# Patient Record
Sex: Female | Born: 1970 | Race: Black or African American | Hispanic: No | Smoking: Never smoker
Health system: Southern US, Community
[De-identification: ages and names within clinical notes are randomized; demographics above are authoritative.]

## PROBLEM LIST (undated history)

## (undated) DIAGNOSIS — Z79899 Other long term (current) drug therapy: Secondary | ICD-10-CM

## (undated) DIAGNOSIS — N898 Other specified noninflammatory disorders of vagina: Secondary | ICD-10-CM

## (undated) DIAGNOSIS — R5383 Other fatigue: Secondary | ICD-10-CM

## (undated) DIAGNOSIS — R531 Weakness: Secondary | ICD-10-CM

## (undated) DIAGNOSIS — M3500 Sicca syndrome, unspecified: Secondary | ICD-10-CM

## (undated) DIAGNOSIS — J3489 Other specified disorders of nose and nasal sinuses: Secondary | ICD-10-CM

## (undated) DIAGNOSIS — R42 Dizziness and giddiness: Secondary | ICD-10-CM

## (undated) DIAGNOSIS — F419 Anxiety disorder, unspecified: Secondary | ICD-10-CM

## (undated) DIAGNOSIS — B009 Herpesviral infection, unspecified: Secondary | ICD-10-CM

## (undated) DIAGNOSIS — M549 Dorsalgia, unspecified: Secondary | ICD-10-CM

## (undated) DIAGNOSIS — K219 Gastro-esophageal reflux disease without esophagitis: Secondary | ICD-10-CM

## (undated) DIAGNOSIS — N200 Calculus of kidney: Secondary | ICD-10-CM

## (undated) DIAGNOSIS — R51 Headache: Secondary | ICD-10-CM

## (undated) DIAGNOSIS — Z87442 Personal history of urinary calculi: Secondary | ICD-10-CM

## (undated) DIAGNOSIS — S0300XA Dislocation of jaw, unspecified side, initial encounter: Secondary | ICD-10-CM

## (undated) DIAGNOSIS — D649 Anemia, unspecified: Secondary | ICD-10-CM

## (undated) DIAGNOSIS — R7989 Other specified abnormal findings of blood chemistry: Secondary | ICD-10-CM

## (undated) DIAGNOSIS — R109 Unspecified abdominal pain: Secondary | ICD-10-CM

## (undated) DIAGNOSIS — N951 Menopausal and female climacteric states: Secondary | ICD-10-CM

## (undated) DIAGNOSIS — M542 Cervicalgia: Secondary | ICD-10-CM

## (undated) DIAGNOSIS — N83201 Unspecified ovarian cyst, right side: Secondary | ICD-10-CM

## (undated) DIAGNOSIS — IMO0001 Reserved for inherently not codable concepts without codable children: Secondary | ICD-10-CM

## (undated) DIAGNOSIS — R0602 Shortness of breath: Secondary | ICD-10-CM

## (undated) DIAGNOSIS — R232 Flushing: Secondary | ICD-10-CM

## (undated) DIAGNOSIS — IMO0002 Reserved for concepts with insufficient information to code with codable children: Secondary | ICD-10-CM

## (undated) DIAGNOSIS — R140 Abdominal distension (gaseous): Secondary | ICD-10-CM

## (undated) DIAGNOSIS — G8929 Other chronic pain: Secondary | ICD-10-CM

## (undated) DIAGNOSIS — M797 Fibromyalgia: Secondary | ICD-10-CM

## (undated) DIAGNOSIS — F99 Mental disorder, not otherwise specified: Secondary | ICD-10-CM

## (undated) DIAGNOSIS — B379 Candidiasis, unspecified: Secondary | ICD-10-CM

## (undated) DIAGNOSIS — T7840XA Allergy, unspecified, initial encounter: Secondary | ICD-10-CM

## (undated) DIAGNOSIS — R202 Paresthesia of skin: Secondary | ICD-10-CM

## (undated) DIAGNOSIS — E785 Hyperlipidemia, unspecified: Secondary | ICD-10-CM

## (undated) DIAGNOSIS — R519 Headache, unspecified: Secondary | ICD-10-CM

## (undated) HISTORY — PX: UPPER GASTROINTESTINAL ENDOSCOPY: SHX188

## (undated) HISTORY — DX: Other long term (current) drug therapy: Z79.899

## (undated) HISTORY — PX: OVARIAN CYST REMOVAL: SHX89

## (undated) HISTORY — PX: SEPTOPLASTY: SUR1290

## (undated) HISTORY — DX: Other fatigue: R53.83

## (undated) HISTORY — DX: Reserved for concepts with insufficient information to code with codable children: IMO0002

## (undated) HISTORY — DX: Herpesviral infection, unspecified: B00.9

## (undated) HISTORY — DX: Mental disorder, not otherwise specified: F99

## (undated) HISTORY — DX: Other specified noninflammatory disorders of vagina: N89.8

## (undated) HISTORY — DX: Anemia, unspecified: D64.9

## (undated) HISTORY — DX: Hyperlipidemia, unspecified: E78.5

## (undated) HISTORY — DX: Unspecified abdominal pain: R10.9

## (undated) HISTORY — DX: Calculus of kidney: N20.0

## (undated) HISTORY — PX: ABDOMINAL HYSTERECTOMY: SHX81

## (undated) HISTORY — DX: Menopausal and female climacteric states: N95.1

## (undated) HISTORY — DX: Anxiety disorder, unspecified: F41.9

## (undated) HISTORY — DX: Other specified disorders of nose and nasal sinuses: J34.89

## (undated) HISTORY — DX: Allergy, unspecified, initial encounter: T78.40XA

## (undated) HISTORY — DX: Dislocation of jaw, unspecified side, initial encounter: S03.00XA

## (undated) HISTORY — DX: Other specified abnormal findings of blood chemistry: R79.89

## (undated) HISTORY — PX: TEMPOROMANDIBULAR JOINT SURGERY: SHX35

## (undated) HISTORY — DX: Reserved for inherently not codable concepts without codable children: IMO0001

## (undated) HISTORY — DX: Abdominal distension (gaseous): R14.0

## (undated) HISTORY — DX: Candidiasis, unspecified: B37.9

## (undated) HISTORY — DX: Flushing: R23.2

## (undated) HISTORY — PX: TONSILLECTOMY: SUR1361

---
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
## 1991-06-02 HISTORY — PX: PARTIAL HYSTERECTOMY: SHX80

## 2003-06-02 HISTORY — PX: CHOLECYSTECTOMY: SHX55

## 2007-03-27 ENCOUNTER — Emergency Department (HOSPITAL_COMMUNITY): Admission: EM | Admit: 2007-03-27 | Discharge: 2007-03-27 | Payer: Self-pay | Admitting: Emergency Medicine

## 2007-12-18 ENCOUNTER — Emergency Department (HOSPITAL_COMMUNITY): Admission: EM | Admit: 2007-12-18 | Discharge: 2007-12-18 | Payer: Self-pay | Admitting: Emergency Medicine

## 2008-02-07 ENCOUNTER — Emergency Department (HOSPITAL_COMMUNITY): Admission: EM | Admit: 2008-02-07 | Discharge: 2008-02-07 | Payer: Self-pay | Admitting: Emergency Medicine

## 2008-02-12 ENCOUNTER — Emergency Department (HOSPITAL_COMMUNITY): Admission: EM | Admit: 2008-02-12 | Discharge: 2008-02-12 | Payer: Self-pay | Admitting: Emergency Medicine

## 2008-04-21 ENCOUNTER — Emergency Department (HOSPITAL_COMMUNITY): Admission: EM | Admit: 2008-04-21 | Discharge: 2008-04-21 | Payer: Self-pay | Admitting: Emergency Medicine

## 2008-06-02 ENCOUNTER — Emergency Department (HOSPITAL_COMMUNITY): Admission: EM | Admit: 2008-06-02 | Discharge: 2008-06-02 | Payer: Self-pay | Admitting: Emergency Medicine

## 2008-07-26 ENCOUNTER — Ambulatory Visit: Admission: RE | Admit: 2008-07-26 | Discharge: 2008-07-26 | Payer: Self-pay | Admitting: Otolaryngology

## 2008-08-21 ENCOUNTER — Ambulatory Visit (HOSPITAL_COMMUNITY): Admission: RE | Admit: 2008-08-21 | Discharge: 2008-08-22 | Payer: Self-pay | Admitting: Neurosurgery

## 2008-08-21 ENCOUNTER — Encounter (INDEPENDENT_AMBULATORY_CARE_PROVIDER_SITE_OTHER): Payer: Self-pay | Admitting: Otolaryngology

## 2008-10-17 ENCOUNTER — Emergency Department (HOSPITAL_COMMUNITY): Admission: EM | Admit: 2008-10-17 | Discharge: 2008-10-17 | Payer: Self-pay | Admitting: Emergency Medicine

## 2008-12-06 ENCOUNTER — Emergency Department (HOSPITAL_COMMUNITY): Admission: EM | Admit: 2008-12-06 | Discharge: 2008-12-06 | Payer: Self-pay | Admitting: Emergency Medicine

## 2008-12-07 ENCOUNTER — Emergency Department (HOSPITAL_COMMUNITY): Admission: EM | Admit: 2008-12-07 | Discharge: 2008-12-07 | Payer: Self-pay | Admitting: Emergency Medicine

## 2008-12-08 ENCOUNTER — Emergency Department (HOSPITAL_COMMUNITY): Admission: EM | Admit: 2008-12-08 | Discharge: 2008-12-08 | Payer: Self-pay | Admitting: Emergency Medicine

## 2009-03-01 ENCOUNTER — Emergency Department (HOSPITAL_COMMUNITY): Admission: EM | Admit: 2009-03-01 | Discharge: 2009-03-01 | Payer: Self-pay | Admitting: Emergency Medicine

## 2009-03-13 ENCOUNTER — Emergency Department (HOSPITAL_COMMUNITY): Admission: EM | Admit: 2009-03-13 | Discharge: 2009-03-13 | Payer: Self-pay | Admitting: Emergency Medicine

## 2009-04-07 ENCOUNTER — Emergency Department (HOSPITAL_COMMUNITY): Admission: EM | Admit: 2009-04-07 | Discharge: 2009-04-07 | Payer: Self-pay | Admitting: Emergency Medicine

## 2009-04-08 ENCOUNTER — Emergency Department (HOSPITAL_COMMUNITY): Admission: EM | Admit: 2009-04-08 | Discharge: 2009-04-08 | Payer: Self-pay | Admitting: Emergency Medicine

## 2009-07-26 ENCOUNTER — Emergency Department (HOSPITAL_COMMUNITY): Admission: EM | Admit: 2009-07-26 | Discharge: 2009-07-26 | Payer: Self-pay | Admitting: Emergency Medicine

## 2009-09-29 ENCOUNTER — Emergency Department (HOSPITAL_COMMUNITY): Admission: EM | Admit: 2009-09-29 | Discharge: 2009-09-29 | Payer: Self-pay | Admitting: Emergency Medicine

## 2010-01-12 ENCOUNTER — Emergency Department (HOSPITAL_COMMUNITY): Admission: EM | Admit: 2010-01-12 | Discharge: 2010-01-12 | Payer: Self-pay | Admitting: Family Medicine

## 2010-01-26 ENCOUNTER — Emergency Department (HOSPITAL_COMMUNITY): Admission: EM | Admit: 2010-01-26 | Discharge: 2010-01-26 | Payer: Self-pay | Admitting: Emergency Medicine

## 2010-02-13 ENCOUNTER — Emergency Department (HOSPITAL_COMMUNITY): Admission: EM | Admit: 2010-02-13 | Discharge: 2010-02-13 | Payer: Self-pay | Admitting: Emergency Medicine

## 2010-03-15 ENCOUNTER — Emergency Department (HOSPITAL_COMMUNITY): Admission: EM | Admit: 2010-03-15 | Discharge: 2010-03-15 | Payer: Self-pay | Admitting: Emergency Medicine

## 2010-06-14 ENCOUNTER — Emergency Department (HOSPITAL_COMMUNITY)
Admission: EM | Admit: 2010-06-14 | Discharge: 2010-06-14 | Payer: Self-pay | Source: Home / Self Care | Admitting: Emergency Medicine

## 2010-06-16 LAB — URINALYSIS, ROUTINE W REFLEX MICROSCOPIC
Bilirubin Urine: NEGATIVE
Ketones, ur: NEGATIVE mg/dL
Leukocytes, UA: NEGATIVE
Nitrite: NEGATIVE
Protein, ur: NEGATIVE mg/dL
Specific Gravity, Urine: >1.03 — ABNORMAL HIGH (ref 1.005–1.030)
Urine Glucose, Fasting: NEGATIVE mg/dL
Urobilinogen, UA: 0.2 mg/dL (ref 0.0–1.0)
pH: 6 (ref 5.0–8.0)

## 2010-06-16 LAB — URINE MICROSCOPIC-ADD ON

## 2010-07-29 ENCOUNTER — Emergency Department (HOSPITAL_COMMUNITY)
Admission: EM | Admit: 2010-07-29 | Discharge: 2010-07-29 | Disposition: A | Discharge status: Home / Self Care | Payer: 59 | Attending: Emergency Medicine | Admitting: Emergency Medicine

## 2010-07-29 DIAGNOSIS — J329 Chronic sinusitis, unspecified: Secondary | ICD-10-CM | POA: Insufficient documentation

## 2010-08-13 LAB — POCT CARDIAC MARKERS
CKMB, poc: <1 ng/mL — ABNORMAL LOW (ref 1.0–8.0)
Myoglobin, poc: 84.7 ng/mL (ref 12–200)
Troponin i, poc: <0.05 ng/mL (ref 0.00–0.09)

## 2010-08-19 LAB — CBC
HCT: 38.4 % (ref 36.0–46.0)
Hemoglobin: 13.5 g/dL (ref 12.0–15.0)
MCHC: 35.2 g/dL (ref 30.0–36.0)
MCV: 91.6 fL (ref 78.0–100.0)
Platelets: 261 10*3/uL (ref 150–400)
RBC: 4.19 MIL/uL (ref 3.87–5.11)
RDW: 12.3 % (ref 11.5–15.5)
WBC: 6.9 10*3/uL (ref 4.0–10.5)

## 2010-08-19 LAB — HEPATIC FUNCTION PANEL
ALT: 17 U/L (ref 0–35)
AST: 25 U/L (ref 0–37)
Albumin: 3.5 g/dL (ref 3.5–5.2)
Alkaline Phosphatase: 58 U/L (ref 39–117)
Bilirubin, Direct: 0.1 mg/dL (ref 0.0–0.3)
Indirect Bilirubin: 0.4 mg/dL (ref 0.3–0.9)
Total Bilirubin: 0.5 mg/dL (ref 0.3–1.2)
Total Protein: 7.3 g/dL (ref 6.0–8.3)

## 2010-08-19 LAB — BASIC METABOLIC PANEL
BUN: 7 mg/dL (ref 6–23)
CO2: 27 mEq/L (ref 19–32)
Calcium: 8.7 mg/dL (ref 8.4–10.5)
Chloride: 101 mEq/L (ref 96–112)
Creatinine, Ser: 0.79 mg/dL (ref 0.4–1.2)
GFR calc Af Amer: >60 mL/min (ref >60)
GFR calc non Af Amer: >60 mL/min (ref >60)
Glucose, Bld: 96 mg/dL (ref 70–99)
Potassium: 3.7 mEq/L (ref 3.5–5.1)
Sodium: 134 mEq/L — ABNORMAL LOW (ref 135–145)

## 2010-08-19 LAB — DIFFERENTIAL
Basophils Absolute: 0 10*3/uL (ref 0.0–0.1)
Basophils Relative: 0 % (ref 0–1)
Eosinophils Absolute: 0.2 10*3/uL (ref 0.0–0.7)
Eosinophils Relative: 2 % (ref 0–5)
Lymphocytes Relative: 12 % (ref 12–46)
Lymphs Abs: 0.8 10*3/uL (ref 0.7–4.0)
Monocytes Absolute: 0.5 10*3/uL (ref 0.1–1.0)
Monocytes Relative: 8 % (ref 3–12)
Neutro Abs: 5.3 10*3/uL (ref 1.7–7.7)
Neutrophils Relative %: 78 % — ABNORMAL HIGH (ref 43–77)

## 2010-08-19 LAB — LIPASE, BLOOD: Lipase: 23 U/L (ref 11–59)

## 2010-08-19 LAB — URINALYSIS, ROUTINE W REFLEX MICROSCOPIC
Bilirubin Urine: NEGATIVE
Glucose, UA: NEGATIVE mg/dL
Ketones, ur: NEGATIVE mg/dL
Leukocytes, UA: NEGATIVE
Nitrite: NEGATIVE
Protein, ur: NEGATIVE mg/dL
Specific Gravity, Urine: >1.03 — ABNORMAL HIGH (ref 1.005–1.030)
Urobilinogen, UA: 0.2 mg/dL (ref 0.0–1.0)
pH: 5.5 (ref 5.0–8.0)

## 2010-08-19 LAB — URINE MICROSCOPIC-ADD ON

## 2010-08-20 LAB — DIFFERENTIAL
Basophils Absolute: 0.1 10*3/uL (ref 0.0–0.1)
Basophils Relative: 1 % (ref 0–1)
Eosinophils Absolute: 0.3 10*3/uL (ref 0.0–0.7)
Eosinophils Relative: 6 % — ABNORMAL HIGH (ref 0–5)
Lymphocytes Relative: 40 % (ref 12–46)
Lymphs Abs: 2.2 10*3/uL (ref 0.7–4.0)
Monocytes Absolute: 0.4 10*3/uL (ref 0.1–1.0)
Monocytes Relative: 7 % (ref 3–12)
Neutro Abs: 2.5 10*3/uL (ref 1.7–7.7)
Neutrophils Relative %: 45 % (ref 43–77)

## 2010-08-20 LAB — BASIC METABOLIC PANEL
BUN: 11 mg/dL (ref 6–23)
CO2: 26 mEq/L (ref 19–32)
Calcium: 9 mg/dL (ref 8.4–10.5)
Chloride: 106 mEq/L (ref 96–112)
Creatinine, Ser: 0.74 mg/dL (ref 0.4–1.2)
GFR calc Af Amer: >60 mL/min (ref >60)
GFR calc non Af Amer: >60 mL/min (ref >60)
Glucose, Bld: 97 mg/dL (ref 70–99)
Potassium: 4.5 mEq/L (ref 3.5–5.1)
Sodium: 137 mEq/L (ref 135–145)

## 2010-08-20 LAB — CBC
HCT: 37.9 % (ref 36.0–46.0)
Hemoglobin: 13.1 g/dL (ref 12.0–15.0)
MCHC: 34.5 g/dL (ref 30.0–36.0)
MCV: 92.3 fL (ref 78.0–100.0)
Platelets: 256 10*3/uL (ref 150–400)
RBC: 4.1 MIL/uL (ref 3.87–5.11)
RDW: 12.7 % (ref 11.5–15.5)
WBC: 5.5 10*3/uL (ref 4.0–10.5)

## 2010-09-04 LAB — URINALYSIS, ROUTINE W REFLEX MICROSCOPIC
Bilirubin Urine: NEGATIVE
Glucose, UA: NEGATIVE mg/dL
Ketones, ur: NEGATIVE mg/dL
Leukocytes, UA: NEGATIVE
Nitrite: NEGATIVE
Protein, ur: NEGATIVE mg/dL
Specific Gravity, Urine: 1.02 (ref 1.005–1.030)
Urobilinogen, UA: 0.2 mg/dL (ref 0.0–1.0)
pH: 6 (ref 5.0–8.0)

## 2010-09-04 LAB — URINE MICROSCOPIC-ADD ON

## 2010-09-07 LAB — URINALYSIS, ROUTINE W REFLEX MICROSCOPIC
Bilirubin Urine: NEGATIVE
Glucose, UA: NEGATIVE mg/dL
Ketones, ur: NEGATIVE mg/dL
Nitrite: NEGATIVE
Nitrite: NEGATIVE
Specific Gravity, Urine: >1.03 — ABNORMAL HIGH (ref 1.005–1.030)
Urobilinogen, UA: 0.2 mg/dL (ref 0.0–1.0)
pH: 6 (ref 5.0–8.0)
pH: 6 (ref 5.0–8.0)

## 2010-09-07 LAB — COMPREHENSIVE METABOLIC PANEL
BUN: 13 mg/dL (ref 6–23)
CO2: 30 mEq/L (ref 19–32)
Calcium: 9.5 mg/dL (ref 8.4–10.5)
Creatinine, Ser: 0.85 mg/dL (ref 0.4–1.2)
GFR calc non Af Amer: >60 mL/min (ref >60)
Glucose, Bld: 107 mg/dL — ABNORMAL HIGH (ref 70–99)
Sodium: 136 mEq/L (ref 135–145)
Total Protein: 7.8 g/dL (ref 6.0–8.3)

## 2010-09-07 LAB — GC/CHLAMYDIA PROBE AMP, GENITAL: GC Probe Amp, Genital: NEGATIVE

## 2010-09-07 LAB — URINE MICROSCOPIC-ADD ON

## 2010-09-07 LAB — WET PREP, GENITAL

## 2010-09-07 LAB — CBC
Hemoglobin: 13.3 g/dL (ref 12.0–15.0)
MCHC: 35.4 g/dL (ref 30.0–36.0)
MCV: 91.7 fL (ref 78.0–100.0)
RBC: 4.1 MIL/uL (ref 3.87–5.11)
RDW: 12.3 % (ref 11.5–15.5)

## 2010-09-07 LAB — LIPASE, BLOOD: Lipase: 39 U/L (ref 11–59)

## 2010-09-07 LAB — DIFFERENTIAL
Eosinophils Absolute: 0.1 10*3/uL (ref 0.0–0.7)
Lymphocytes Relative: 29 % (ref 12–46)
Lymphs Abs: 1.8 10*3/uL (ref 0.7–4.0)
Monocytes Relative: 9 % (ref 3–12)
Neutro Abs: 3.6 10*3/uL (ref 1.7–7.7)
Neutrophils Relative %: 58 % (ref 43–77)

## 2010-09-09 LAB — RAPID STREP SCREEN (MED CTR MEBANE ONLY): Streptococcus, Group A Screen (Direct): POSITIVE — AB

## 2010-09-11 LAB — CBC
HCT: 39.2 % (ref 36.0–46.0)
Hemoglobin: 13.8 g/dL (ref 12.0–15.0)
RBC: 4.25 MIL/uL (ref 3.87–5.11)
RDW: 12.8 % (ref 11.5–15.5)
WBC: 5 10*3/uL (ref 4.0–10.5)

## 2010-09-11 LAB — URINALYSIS, ROUTINE W REFLEX MICROSCOPIC
Specific Gravity, Urine: 1.021 (ref 1.005–1.030)
Urobilinogen, UA: 1 mg/dL (ref 0.0–1.0)
pH: 5.5 (ref 5.0–8.0)

## 2010-09-11 LAB — URINE MICROSCOPIC-ADD ON

## 2010-10-14 NOTE — H&P (Signed)
NAME:  Yvette Mayer, Yvette Mayer NO.:  [id number]   MEDICAL RECORD NO.:  [id number]          PATIENT TYPE:  OIB   LOCATION:                               FACILITY:  [facility]   PHYSICIAN:  Hermelinda Medicus, M.D.   DATE OF BIRTH:  [birth year]   DATE OF ADMISSION:  08/21/2008  DATE OF DISCHARGE:                              HISTORY & PHYSICAL   This patient is a 40 year old female who has had difficulty with snoring  and sleeping and yet she had a sleep study which shows a BMI of 34 and  AHI of 1, lowest O2 saturation 92, and essentially she was a snorer with  no evidence of sleep apnea.  She, however, also has had a sinusitis and  had a CAT scan of her sinuses and this has been going on for also  several years, and she even after considerable treatment including  Avelox, also Symbicort, Zyrtec, and Mucinex, she still has maxillary  sinusitis.  Thirdly, she has been on antibiotics as well for her  tonsillitis, has intermittent tonsillitis over the last 3 years and has  been on antibiotics for this problem.  She now enters for functional  endoscopic sinus surgery to improve her maxillary sinus drainage points.  She also will have the nasal turbinates reduced in an effort to improve  her nasal status and her airway and for her tonsillitis she will have a  tonsillectomy.   Her past history is remarkable in the fact that she does have a history  of some cyst removed.  She also has reflux esophagitis which has been  cared for.  She has also had a hiatal hernia.   She has had a partial hysterectomy in 1994.  She had gallbladder surgery  in 2007 and C-sections in 1991 and 1993.   She has allergies to PERCOCET, LORTAB, and PENICILLIN.   Her physical examination reveals a blood pressure of 112/73 with a pulse  of 69.  Her ears are clear.  The tympanic membranes look excellent and  move well.  Her nose shows very heavy turbinate hypertrophy with the  sinusitis, just the maxillary is  persistent.  She has very large tonsils  with exudate, and her neck is free of any thyromegaly, cervical  adenopathy, or mass.  Her chest is clear.  No rales, rhonchi, or  wheezes, and her cardiovascular no wink, snaps, murmurs, or gallops.  Extremities unremarkable.   Initial diagnoses are tonsillitis, bilateral maxillary sinusitis with  turbinate hypertrophy with history of snoring without history of sleep  apnea, history of PENICILLIN and LORTAB and PERCOCET allergy, and  history of reflux esophagitis.           ______________________________  Hermelinda Medicus, M.D.     JC/MEDQ  D:  08/21/2008  T:  08/22/2008  Job:  [id number]   cc:   Davy Pique, MD

## 2010-10-14 NOTE — Op Note (Signed)
NAMESHANYCE, DARIS NO.:  [id number]   MEDICAL RECORD NO.:  [id number]          PATIENT TYPE:  OIB   LOCATION:  5127                         FACILITY:  [facility]   PHYSICIAN:  Hermelinda Medicus, M.D.   DATE OF BIRTH:  10-Sep-1970   DATE OF PROCEDURE:  DATE OF DISCHARGE:                               OPERATIVE REPORT   The patient is a patient who has persistent maxillary sinusitis,  turbinate hypertrophy, tonsillitis, has had a sleep study, which shows  snoring.  She now is entering for a functional endoscopic sinus surgery  and bilateral maxillary sinus ostial enlargement with turbinate  reduction and a tonsillectomy.  She is aware of the risks and gains.  She is aware of the sinuses can still get infected if she has issues  especially with considerable exposure.  She also is aware that she need  to be very careful with the diet for at least 2 weeks, no travel of  distant locations for 2 weeks, and to be on a soft bland diet.  She is  aware that tonsils could bleed and that she is going to have  considerable tonsillar pain postoperatively, which will be treated with  antibiotics and pain medication.   PREOPERATIVE DIAGNOSES:  Bilateral maxillary sinusitis, turbinate  hypertrophy with tonsillitis.   POSTOPERATIVE DIAGNOSES:  Bilateral maxillary sinusitis, turbinate  hypertrophy with tonsillitis.   OPERATIONS:  A functional endoscopic sinus surgery, bilateral maxillary  sinus ostial enlargement with inferior turbinate reduction with a  tonsillectomy.   OPERATOR:  Hermelinda Medicus, MD   ANESTHESIA:  General endotracheal with Dr. Randa Evens.   PROCEDURE:  The patient was placed in supine position and under general  endotracheal anesthesia, the nose was first prepared.  We anesthetized  it further with 1% Xylocaine with epinephrine and topical cocaine 200  mg.  The inferior turbinates were extremely large, were outfractured  aggressively, and we gained considerable  space within the nose.  The  left middle turbinate was also somewhat a concha bullosa, and we did not  remove any mucous membrane but narrowed this to get better space for her  maxillary sinuses to drain.  The natural ostia were then viewed through  the 0-degree scope, were found to be pin hole size and then the curved  suction was used to further expose these and then the backbiting forceps  was used to increase this approximately 5 times as normal size.  Once we  looked inside the sinus using the scope, we could see that thickened  membrane but we felt once the sinuses will be drained we can expect  these to revert back to a more normal status.  The maxillary sinus on  the left was first done and then on the right again using the 0-degree  scope finding the natural ostium, which was again a pin hole size and  then increasing this in size approximately 5 times as normal size.  We  suctioned both sinuses, and at this point, placed some Telfa within the  nose to minimize any swelling and then we turned our attention to the  tonsillar region,  which were large 3 to 4+ in size, had exudate, and the  Crowe-Davis mouth gag was placed without difficulty.  The tongue was  compressed inferiorly in her mouth and we could see the tonsils quite  clearly.  We suctioned her oral cavity and then the tonsils were removed  using blunt and Bovie electrocoagulation dissection.  All hemostasis was  established with Bovie electrocoagulation and the left was done first,  the right was done second.  The right side was the more severely scarred  and inflamed and she had been just recently on antibiotics using Avelox.  We will give her Decadron postop to minimize swelling or pain, 10 mg of  Decadron.  Once the tonsils were removed, all hemostasis was  established.  The stomach was suctioned, the nasopharynx was suctioned,  and then the gag was slowly removed checking again the tonsillar beds  and then the gag was  completely removed.  Then, the patient was awakened  and tolerated procedure very well and is doing well postop.  She will be  kept overnight because of the packing within her  nose and hopefully this will be pulled this afternoon as long as it is  not any more bleeding than expected.  The plan postoperatively will be  to see her on Friday, 3 days from now and then in 10 days and then 3  weeks and 6 weeks and 3 months.           ______________________________  Hermelinda Medicus, M.D.     JC/MEDQ  D:  08/21/2008  T:  08/22/2008  Job:  [id number]   cc:   Davy Pique

## 2010-10-17 NOTE — Procedures (Signed)
NAMESHEALEE, YORDY             ACCOUNT NO.:  [id number]   MEDICAL RECORD NO.:  [id number]          PATIENT TYPE:  OUT   LOCATION:  SLEE                          FACILITY:  [facility]   PHYSICIAN:  Kofi A. Gerilyn Pilgrim, M.D. DATE OF BIRTH:  May 22, 1971   DATE OF PROCEDURE:  08/04/2008  DATE OF DISCHARGE:  07/26/2008                             SLEEP DISORDER REPORT   INDICATIONS:  A 40 year old lady who presents with loud snoring, daytime  sleepiness, and has been evaluated for obstructive sleep apnea syndrome.   MEDICATIONS:  Mucinex, Zyrtec, Symbicort, Avelox.   Epworth sleepiness scale 13.  BMI 34.   ARCHITECTURAL SUMMARY:  The total recording time is 433 minutes.  Sleep  efficiency 75%.  Sleep latency 41 minutes.  REM latency 194 minutes.  Stage N1 2.5% and 252% and 325% and REM sleep 21%.   RESPIRATORY SUMMARY:  Baseline oxygen saturation 97 with lowest  saturation 92, AHI 1.   LIMB MOVEMENT SUMMARY:  PLM index 0.   ELECTROCARDIOGRAM SUMMARY:  Average heart rate 67 with no significant  dysrhythmias observed.   IMPRESSION:  Unremarkable nocturnal polysomnography.      Kofi A. Gerilyn Pilgrim, M.D.  Electronically Signed     KAD/MEDQ  D:  08/04/2008  T:  08/05/2008  Job:  [id number]

## 2010-10-19 ENCOUNTER — Emergency Department (HOSPITAL_COMMUNITY)
Admission: EM | Admit: 2010-10-19 | Discharge: 2010-10-19 | Disposition: A | Discharge status: Home / Self Care | Payer: 59 | Attending: Emergency Medicine | Admitting: Emergency Medicine

## 2010-10-19 DIAGNOSIS — J029 Acute pharyngitis, unspecified: Secondary | ICD-10-CM | POA: Insufficient documentation

## 2010-10-19 DIAGNOSIS — J069 Acute upper respiratory infection, unspecified: Secondary | ICD-10-CM | POA: Insufficient documentation

## 2010-10-24 ENCOUNTER — Emergency Department (HOSPITAL_COMMUNITY): Payer: 59

## 2010-10-24 ENCOUNTER — Emergency Department (HOSPITAL_COMMUNITY)
Admission: EM | Admit: 2010-10-24 | Discharge: 2010-10-24 | Disposition: A | Discharge status: Home / Self Care | Payer: 59 | Attending: Emergency Medicine | Admitting: Emergency Medicine

## 2010-10-24 DIAGNOSIS — K219 Gastro-esophageal reflux disease without esophagitis: Secondary | ICD-10-CM | POA: Insufficient documentation

## 2010-10-24 DIAGNOSIS — IMO0001 Reserved for inherently not codable concepts without codable children: Secondary | ICD-10-CM | POA: Insufficient documentation

## 2010-10-24 DIAGNOSIS — E7800 Pure hypercholesterolemia, unspecified: Secondary | ICD-10-CM | POA: Insufficient documentation

## 2010-10-24 DIAGNOSIS — J45909 Unspecified asthma, uncomplicated: Secondary | ICD-10-CM | POA: Insufficient documentation

## 2010-10-24 DIAGNOSIS — J069 Acute upper respiratory infection, unspecified: Secondary | ICD-10-CM | POA: Insufficient documentation

## 2010-10-24 LAB — URINALYSIS, ROUTINE W REFLEX MICROSCOPIC
Glucose, UA: NEGATIVE mg/dL
Ketones, ur: NEGATIVE mg/dL
Leukocytes, UA: NEGATIVE
Protein, ur: NEGATIVE mg/dL
Urobilinogen, UA: 0.2 mg/dL (ref 0.0–1.0)

## 2010-10-24 LAB — URINE MICROSCOPIC-ADD ON

## 2010-12-05 ENCOUNTER — Emergency Department (HOSPITAL_COMMUNITY)
Admission: EM | Admit: 2010-12-05 | Discharge: 2010-12-05 | Disposition: A | Discharge status: Home / Self Care | Payer: 59 | Attending: Emergency Medicine | Admitting: Emergency Medicine

## 2010-12-05 ENCOUNTER — Other Ambulatory Visit: Payer: Self-pay

## 2010-12-05 ENCOUNTER — Emergency Department (HOSPITAL_COMMUNITY): Payer: 59

## 2010-12-05 DIAGNOSIS — E7800 Pure hypercholesterolemia, unspecified: Secondary | ICD-10-CM | POA: Insufficient documentation

## 2010-12-05 DIAGNOSIS — R110 Nausea: Secondary | ICD-10-CM | POA: Insufficient documentation

## 2010-12-05 DIAGNOSIS — K219 Gastro-esophageal reflux disease without esophagitis: Secondary | ICD-10-CM | POA: Insufficient documentation

## 2010-12-05 DIAGNOSIS — IMO0001 Reserved for inherently not codable concepts without codable children: Secondary | ICD-10-CM | POA: Insufficient documentation

## 2010-12-05 DIAGNOSIS — Z79899 Other long term (current) drug therapy: Secondary | ICD-10-CM | POA: Insufficient documentation

## 2010-12-05 DIAGNOSIS — K5900 Constipation, unspecified: Secondary | ICD-10-CM | POA: Insufficient documentation

## 2010-12-05 DIAGNOSIS — R109 Unspecified abdominal pain: Secondary | ICD-10-CM | POA: Insufficient documentation

## 2010-12-05 LAB — COMPREHENSIVE METABOLIC PANEL
AST: 22 U/L (ref 0–37)
BUN: 11 mg/dL (ref 6–23)
CO2: 26 mEq/L (ref 19–32)
Chloride: 104 mEq/L (ref 96–112)
Creatinine, Ser: 0.74 mg/dL (ref 0.50–1.10)
GFR calc Af Amer: >60 mL/min (ref >60)
GFR calc non Af Amer: >60 mL/min (ref >60)
Glucose, Bld: 98 mg/dL (ref 70–99)
Total Bilirubin: 0.4 mg/dL (ref 0.3–1.2)

## 2010-12-05 LAB — URINALYSIS, ROUTINE W REFLEX MICROSCOPIC
Bilirubin Urine: NEGATIVE
Ketones, ur: NEGATIVE mg/dL
Protein, ur: NEGATIVE mg/dL
Urobilinogen, UA: 0.2 mg/dL (ref 0.0–1.0)

## 2010-12-05 LAB — DIFFERENTIAL
Lymphocytes Relative: 40 % (ref 12–46)
Lymphs Abs: 1.9 10*3/uL (ref 0.7–4.0)
Monocytes Relative: 10 % (ref 3–12)
Neutrophils Relative %: 48 % (ref 43–77)

## 2010-12-05 LAB — CBC
HCT: 37.9 % (ref 36.0–46.0)
MCV: 90.7 fL (ref 78.0–100.0)
RBC: 4.18 MIL/uL (ref 3.87–5.11)
WBC: 4.8 10*3/uL (ref 4.0–10.5)

## 2010-12-05 LAB — URINE MICROSCOPIC-ADD ON

## 2010-12-05 LAB — CK TOTAL AND CKMB (NOT AT ARMC)
CK, MB: 2 ng/mL (ref 0.3–4.0)
Total CK: 164 U/L (ref 7–177)

## 2010-12-05 MED ORDER — IOHEXOL 300 MG/ML  SOLN
100.0000 mL | Freq: Once | INTRAMUSCULAR | Status: AC | PRN
  Administered 2010-12-05: 100 mL via INTRAVENOUS

## 2010-12-30 ENCOUNTER — Ambulatory Visit: Payer: 59 | Admitting: Urgent Care

## 2011-01-13 ENCOUNTER — Ambulatory Visit (INDEPENDENT_AMBULATORY_CARE_PROVIDER_SITE_OTHER): Payer: 59 | Admitting: Urgent Care

## 2011-01-13 ENCOUNTER — Encounter: Payer: Self-pay | Admitting: Urgent Care

## 2011-01-13 DIAGNOSIS — R109 Unspecified abdominal pain: Secondary | ICD-10-CM | POA: Insufficient documentation

## 2011-01-13 DIAGNOSIS — R1310 Dysphagia, unspecified: Secondary | ICD-10-CM | POA: Insufficient documentation

## 2011-01-13 MED ORDER — HYOSCYAMINE SULFATE 0.125 MG SL SUBL: 0.1250 mg | SUBLINGUAL_TABLET | Freq: Three times a day (TID) | SUBLINGUAL | Status: AC

## 2011-01-13 MED ORDER — DEXLANSOPRAZOLE 60 MG PO CPDR: 60.0000 mg | DELAYED_RELEASE_CAPSULE | Freq: Every day | ORAL | Status: DC

## 2011-01-13 NOTE — Assessment & Plan Note (Signed)
Will need EGD with possible esophageal dilation with Dr. Jena Gauss to r/o esophageal web, ring or stricture, as well as esophagitis, gastritis, PUD.  I have discussed risks & benefits which include, but are not limited to, bleeding, infection, perforation & drug reaction.  The patient agrees with this plan & written consent will be obtained.

## 2011-01-13 NOTE — Assessment & Plan Note (Addendum)
Yvette Mayer is a 40 y.o. female w/ chronic generalized abdominal pain & GERD.  CT & labs reassuring.  I suspect GERD & IBS (functional abdominal pain), but cannot r/o complicated GERD, PUD, gastritis or celiac disease.  Stop prilosec Start Dexilant 60mg  daily Use levsin before meals & bedtime (4 times per day) as needed for diarrhea/abd pain Go to lab as soon as possible--celiac panel/IgA

## 2011-01-13 NOTE — Patient Instructions (Addendum)
Stop prilosec Start Dexilant 60mg  daily Use levsin before meals & bedtime (4 times per day) as needed for diarrhea/abd pain Go to lab as soon as possible

## 2011-01-13 NOTE — Progress Notes (Signed)
Primary Care Physician:  n/a Primary Gastroenterologist:  Dr. Jena Gauss  Chief Complaint  Patient presents with  . Abdominal Pain  . Heartburn  . Dysphagia    HPI:  Yvette Mayer is a 40 y.o. female here as a new patient referred from the Emergency Dept for C/o heartburn & indigestion daily x couple yrs.  Takes prilosec 20mg  & helps some, but still feels indigestion.  C/o waterbrash & globus.  C/o chest pain.  Pain radiates to back.  C/o post-prandial  Diarrhea.  Pain 8/10 now & 10/10 at worst.  C/o generalized abd pain.  C/o dysphagia in mid-esophagus for solids/liquids.  Fatigue daily.  +constipation w/ BM q2-3 days.  Denies rectal bleeding or melena.  Wt stable.  Appetite ok.   CT w/ IV contrast A/P 12/05/10->Status post cholecystectomy.  No intrahepatic or extrahepatic ductal dilatation.  No evidence of bowel obstruction.  Normal appendix.  Recent Results (from the past 1008 hour(s))  URINE MICROSCOPIC-ADD ON   Collection Time   12/05/10  7:40 AM      Component Value Range   Squamous Epithelial / LPF MANY (*) RARE    WBC, UA 0-2  <3 (WBC/hpf)   RBC / HPF 3-6  <3 (RBC/hpf)   Bacteria, UA RARE  RARE   URINALYSIS, ROUTINE W REFLEX MICROSCOPIC   Collection Time   12/05/10  7:40 AM      Component Value Range   Color, Urine YELLOW  YELLOW    Appearance CLEAR  CLEAR    Specific Gravity, Urine 1.020  1.005 - 1.030    pH 6.0  5.0 - 8.0    Glucose, UA NEGATIVE  NEGATIVE (mg/dL)   Hgb urine dipstick SMALL (*) NEGATIVE    Bilirubin Urine NEGATIVE  NEGATIVE    Ketones, ur NEGATIVE  NEGATIVE (mg/dL)   Protein, ur NEGATIVE  NEGATIVE (mg/dL)   Urobilinogen, UA 0.2  0.0 - 1.0 (mg/dL)   Nitrite NEGATIVE  NEGATIVE    Leukocytes, UA NEGATIVE  NEGATIVE   DIFFERENTIAL   Collection Time   12/05/10  7:49 AM      Component Value Range   Neutrophils Relative 48  43 - 77 (%)   Neutro Abs 2.3  1.7 - 7.7 (K/uL)   Lymphocytes Relative 40  12 - 46 (%)   Lymphs Abs 1.9  0.7 - 4.0 (K/uL)   Monocytes  Relative 10  3 - 12 (%)   Monocytes Absolute 0.5  0.1 - 1.0 (K/uL)   Eosinophils Relative 2  0 - 5 (%)   Eosinophils Absolute 0.1  0.0 - 0.7 (K/uL)   Basophils Relative 1  0 - 1 (%)   Basophils Absolute 0.0  0.0 - 0.1 (K/uL)  CBC   Collection Time   12/05/10  7:49 AM      Component Value Range   WBC 4.8  4.0 - 10.5 (K/uL)   RBC 4.18  3.87 - 5.11 (MIL/uL)   Hemoglobin 13.0  12.0 - 15.0 (g/dL)   HCT 16.1  09.6 - 04.5 (%)   MCV 90.7  78.0 - 100.0 (fL)   MCH 31.1  26.0 - 34.0 (pg)   MCHC 34.3  30.0 - 36.0 (g/dL)   RDW 40.9  81.1 - 91.4 (%)   Platelets 262  150 - 400 (K/uL)  COMPREHENSIVE METABOLIC PANEL   Collection Time   12/05/10  7:49 AM      Component Value Range   Sodium 139  135 - 145 (mEq/L)  Potassium 3.9  3.5 - 5.1 (mEq/L)   Chloride 104  96 - 112 (mEq/L)   CO2 26  19 - 32 (mEq/L)   Glucose, Bld 98  70 - 99 (mg/dL)   BUN 11  6 - 23 (mg/dL)   Creatinine, Ser 3.24  0.50 - 1.10 (mg/dL)   Calcium 9.2  8.4 - 40.1 (mg/dL)   Total Protein 8.1  6.0 - 8.3 (g/dL)   Albumin 3.7  3.5 - 5.2 (g/dL)   AST 22  0 - 37 (U/L)   ALT 12  0 - 35 (U/L)   Alkaline Phosphatase 60  39 - 117 (U/L)   Total Bilirubin 0.4  0.3 - 1.2 (mg/dL)   GFR calc non Af Amer >60  >60 (mL/min)   GFR calc Af Amer >60  >60 (mL/min)  CK TOTAL AND CKMB   Collection Time   12/05/10  7:49 AM      Component Value Range   Total CK 164  7 - 177 (U/L)   CK, MB 2.0  0.3 - 4.0 (ng/mL)   Relative Index 1.2  0.0 - 2.5   LIPASE, BLOOD   Collection Time   12/05/10  7:49 AM      Component Value Range   Lipase 26  11 - 59 (U/L)  TROPONIN I   Collection Time   12/05/10  7:49 AM      Component Value Range   Troponin I <0.30  <0.30 (ng/mL)   Past Medical History  Diagnosis Date  . Anemia     iron 1 yr ago, normal hgb 7/12  . Asthma   . Sinus drainage   . Anxiety   . Hyperlipidemia    Past Surgical History  Procedure Date  . Cholecystectomy 2005    cholelithiasis  . Partial hysterectomy   . Cesarean section      x2  . Ovarian cyst removal    Current Outpatient Prescriptions  Medication Sig Dispense Refill  . omeprazole (PRILOSEC OTC) 20 MG tablet Take 20 mg by mouth daily.        . pravastatin (PRAVACHOL) 40 MG tablet Take 40 mg by mouth daily.        . sodium chloride (OCEAN) 0.65 % nasal spray Place 1 spray into the nose as needed.        Marland Kitchen dexlansoprazole (DEXILANT) 60 MG capsule Take 1 capsule (60 mg total) by mouth daily.  31 capsule  2  . hyoscyamine (LEVSIN/SL) 0.125 MG SL tablet Place 1 tablet (0.125 mg total) under the tongue 4 (four) times daily -  with meals and at bedtime.  90 tablet  1   Allergies as of 01/13/2011 - Review Complete 01/13/2011  Allergen Reaction Noted  . Lortab Itching 01/13/2011  . Percocet (oxycodone-acetaminophen) Itching 01/13/2011   Family History:There is no known family history of colorectal carcinoma , liver disease, or inflammatory bowel disease.  Problem Relation Age of Onset  . Diabetes Father   . Hypertension Mother    History   Social History  . Marital Status: Married    Spouse Name: N/A    Number of Children: 3  . Years of Education: N/A   Occupational History  . [employer]    Social History Main Topics  . Smoking status: Never Smoker   . Smokeless tobacco: Not on file  . Alcohol Use: No  . Drug Use: No  . Sexually Active: Not on file   Review of Systems: Gen: Chronic  insomnia, fatigue, weakness HEENT:  Multiple visits to 6 ENTs for chronic sinus problems, ear pain CV: Denies chest pain, angina, palpitations, syncope, orthopnea, PND, peripheral edema, and claudication. Resp: Denies dyspnea at rest, dyspnea with exercise, cough, sputum, wheezing, coughing up blood, and pleurisy. GI: Denies vomiting blood, jaundice, and fecal incontinence.   GU : +microscopic hematuria w/ evaluation by urologist in [place], [place]. Benign Denies urinary burning, blood in urine, urinary frequency, urinary hesitancy, nocturnal urination, and  urinary incontinence. MS: Denies joint pain, limitation of movement, and swelling, stiffness, low back pain, extremity pain. Denies muscle weakness, cramps, atrophy.  Derm: Denies rash, itching, dry skin, hives, moles, warts, or unhealing ulcers.  Psych: Denies depression, anxiety, memory loss, suicidal ideation, hallucinations, paranoia, and confusion. Heme: Denies bruising, bleeding, and enlarged lymph nodes.  Physical Exam: BP 108/66  Pulse 65  Temp(Src) 98.1 F (36.7 C) (Temporal)  Ht 5\' 4"  (1.626 m)  Wt 167 lb (75.751 kg)  BMI 28.67 kg/m2 General:   Alert,  Well-developed, well-nourished, pleasant and cooperative in NAD Head:  Normocephalic and atraumatic. Eyes:  Sclera clear, no icterus.   Conjunctiva pink. Ears:  Normal auditory acuity. Nose:  No deformity, discharge,  or lesions. Mouth:  No deformity or lesions, dentition normal. Neck:  Supple; no masses or thyromegaly. Lungs:  Clear throughout to auscultation.   No wheezes, crackles, or rhonchi. No acute distress. Heart:  Regular rate and rhythm; no murmurs, clicks, rubs,  or gallops. Abdomen:  Soft, nontender and nondistended. No masses, hepatosplenomegaly or hernias noted. Normal bowel sounds, without guarding, and without rebound.   Rectal:  Deferred until time of colonoscopy.   Msk:  Symmetrical without gross deformities. Normal posture. Pulses:  Normal pulses noted. Extremities:  Without clubbing or edema. Neurologic:  Alert and  oriented x4;  grossly normal neurologically. Skin:  Intact without significant lesions or rashes. Cervical Nodes:  No significant cervical adenopathy. Psych:  Alert and cooperative. Normal mood and affect.

## 2011-01-14 ENCOUNTER — Telehealth: Payer: Self-pay | Admitting: General Practice

## 2011-01-14 LAB — GLIA (IGA/G) + TTG IGA: Tissue Transglutaminase Ab, IgA: 1.1 U/mL (ref <20)

## 2011-01-14 LAB — IGA: IgA: 228 mg/dL (ref 69–380)

## 2011-01-14 NOTE — Telephone Encounter (Signed)
I called pt to schedule egd, no answer,lmom. 

## 2011-01-14 NOTE — Telephone Encounter (Signed)
Pt agree to see Dr Jena Gauss and procedure scheduled for 01/23/2011@9 :00am

## 2011-01-14 NOTE — Progress Notes (Signed)
Quick Note:  Please call pt & let her know No celiac disease Suspect she had IBS as far as diarrhea/constipation Keep EGD as planned Thanks ______

## 2011-01-22 MED ORDER — SODIUM CHLORIDE 0.45 % IV SOLN
Freq: Once | INTRAVENOUS | Status: AC
  Administered 2011-01-23: 09:00:00 via INTRAVENOUS

## 2011-01-23 ENCOUNTER — Encounter (HOSPITAL_COMMUNITY): Payer: Self-pay

## 2011-01-23 ENCOUNTER — Encounter (HOSPITAL_COMMUNITY)
Admission: RE | Disposition: A | Discharge status: Home / Self Care | Payer: Self-pay | Source: Ambulatory Visit | Attending: Internal Medicine

## 2011-01-23 ENCOUNTER — Ambulatory Visit (HOSPITAL_COMMUNITY)
Admission: RE | Admit: 2011-01-23 | Discharge: 2011-01-23 | Disposition: A | Discharge status: Home / Self Care | Payer: 59 | Source: Ambulatory Visit | Attending: Internal Medicine | Admitting: Internal Medicine

## 2011-01-23 ENCOUNTER — Other Ambulatory Visit: Payer: Self-pay | Admitting: Internal Medicine

## 2011-01-23 DIAGNOSIS — K449 Diaphragmatic hernia without obstruction or gangrene: Secondary | ICD-10-CM | POA: Insufficient documentation

## 2011-01-23 DIAGNOSIS — R933 Abnormal findings on diagnostic imaging of other parts of digestive tract: Secondary | ICD-10-CM

## 2011-01-23 DIAGNOSIS — R109 Unspecified abdominal pain: Secondary | ICD-10-CM | POA: Insufficient documentation

## 2011-01-23 DIAGNOSIS — R1319 Other dysphagia: Secondary | ICD-10-CM | POA: Insufficient documentation

## 2011-01-23 DIAGNOSIS — E785 Hyperlipidemia, unspecified: Secondary | ICD-10-CM | POA: Insufficient documentation

## 2011-01-23 DIAGNOSIS — R1310 Dysphagia, unspecified: Secondary | ICD-10-CM

## 2011-01-23 DIAGNOSIS — K219 Gastro-esophageal reflux disease without esophagitis: Secondary | ICD-10-CM

## 2011-01-23 HISTORY — PX: MALONEY DILATION: SHX5535

## 2011-01-23 HISTORY — DX: Gastro-esophageal reflux disease without esophagitis: K21.9

## 2011-01-23 HISTORY — DX: Shortness of breath: R06.02

## 2011-01-23 HISTORY — PX: ESOPHAGOGASTRODUODENOSCOPY: SHX5428

## 2011-01-23 HISTORY — DX: Fibromyalgia: M79.7

## 2011-01-23 HISTORY — DX: Unspecified ovarian cyst, right side: N83.201

## 2011-01-23 SURGERY — EGD (ESOPHAGOGASTRODUODENOSCOPY)
Anesthesia: Moderate Sedation

## 2011-01-23 MED ORDER — MIDAZOLAM HCL 5 MG/5ML IJ SOLN
INTRAMUSCULAR | Status: DC | PRN
  Administered 2011-01-23: 1 mg via INTRAVENOUS
  Administered 2011-01-23: 2 mg via INTRAVENOUS

## 2011-01-23 MED ORDER — MEPERIDINE HCL 100 MG/ML IJ SOLN
INTRAMUSCULAR | Status: AC
  Filled 2011-01-23: qty 2

## 2011-01-23 MED ORDER — MIDAZOLAM HCL 5 MG/5ML IJ SOLN
INTRAMUSCULAR | Status: AC
  Filled 2011-01-23: qty 10

## 2011-01-23 MED ORDER — MEPERIDINE HCL 100 MG/ML IJ SOLN
INTRAMUSCULAR | Status: DC | PRN
  Administered 2011-01-23: 50 mg via INTRAVENOUS
  Administered 2011-01-23: 25 mg via INTRAVENOUS

## 2011-01-23 MED ORDER — BUTAMBEN-TETRACAINE-BENZOCAINE 2-2-14 % EX AERO
INHALATION_SPRAY | CUTANEOUS | Status: DC | PRN
  Administered 2011-01-23: 2 via TOPICAL

## 2011-01-23 NOTE — H&P (Signed)
Lorenza Burton, NP  01/13/2011 10:59 PM  Signed Primary Care Physician:  n/a Primary Gastroenterologist:  Dr. Jena Gauss    Chief Complaint   Patient presents with   .  Abdominal Pain   .  Heartburn   .  Dysphagia      HPI:  Yvette Mayer is a 40 y.o. female here as a new patient referred from the Emergency Dept for C/o heartburn & indigestion daily x couple yrs.  Takes prilosec 20mg  & helps some, but still feels indigestion.  C/o waterbrash & globus.  C/o chest pain.  Pain radiates to back.  C/o post-prandial  Diarrhea.  Pain 8/10 now & 10/10 at worst.  C/o generalized abd pain.  C/o dysphagia in mid-esophagus for solids/liquids.  Fatigue daily.  +constipation w/ BM q2-3 days.  Denies rectal bleeding or melena.  Wt stable.  Appetite ok.    CT w/ IV contrast A/P 12/05/10->Status post cholecystectomy.  No intrahepatic or extrahepatic ductal dilatation.  No evidence of bowel obstruction.  Normal appendix.    Recent Results (from the past 1008 hour(s))   URINE MICROSCOPIC-ADD ON     Collection Time     12/05/10  7:40 AM       Component  Value  Range     Squamous Epithelial / LPF  MANY (*)  RARE      WBC, UA  0-2   <3 (WBC/hpf)     RBC / HPF  3-6   <3 (RBC/hpf)     Bacteria, UA  RARE   RARE    URINALYSIS, ROUTINE W REFLEX MICROSCOPIC     Collection Time     12/05/10  7:40 AM       Component  Value  Range     Color, Urine  YELLOW   YELLOW      Appearance  CLEAR   CLEAR      Specific Gravity, Urine  1.020   1.005 - 1.030      pH  6.0   5.0 - 8.0      Glucose, UA  NEGATIVE   NEGATIVE (mg/dL)     Hgb urine dipstick  SMALL (*)  NEGATIVE      Bilirubin Urine  NEGATIVE   NEGATIVE      Ketones, ur  NEGATIVE   NEGATIVE (mg/dL)     Protein, ur  NEGATIVE   NEGATIVE (mg/dL)     Urobilinogen, UA  0.2   0.0 - 1.0 (mg/dL)     Nitrite  NEGATIVE   NEGATIVE      Leukocytes, UA  NEGATIVE   NEGATIVE    DIFFERENTIAL     Collection Time     12/05/10  7:49 AM       Component  Value  Range     Neutrophils  Relative  48   43 - 77 (%)     Neutro Abs  2.3   1.7 - 7.7 (K/uL)     Lymphocytes Relative  40   12 - 46 (%)     Lymphs Abs  1.9   0.7 - 4.0 (K/uL)     Monocytes Relative  10   3 - 12 (%)     Monocytes Absolute  0.5   0.1 - 1.0 (K/uL)     Eosinophils Relative  2   0 - 5 (%)     Eosinophils Absolute  0.1   0.0 - 0.7 (K/uL)     Basophils Relative  1  0 - 1 (%)     Basophils Absolute  0.0   0.0 - 0.1 (K/uL)   CBC     Collection Time     12/05/10  7:49 AM       Component  Value  Range     WBC  4.8   4.0 - 10.5 (K/uL)     RBC  4.18   3.87 - 5.11 (MIL/uL)     Hemoglobin  13.0   12.0 - 15.0 (g/dL)     HCT  40.9   81.1 - 46.0 (%)     MCV  90.7   78.0 - 100.0 (fL)     MCH  31.1   26.0 - 34.0 (pg)     MCHC  34.3   30.0 - 36.0 (g/dL)     RDW  91.4   78.2 - 15.5 (%)     Platelets  262   150 - 400 (K/uL)   COMPREHENSIVE METABOLIC PANEL     Collection Time     12/05/10  7:49 AM       Component  Value  Range     Sodium  139   135 - 145 (mEq/L)     Potassium  3.9   3.5 - 5.1 (mEq/L)     Chloride  104   96 - 112 (mEq/L)     CO2  26   19 - 32 (mEq/L)     Glucose, Bld  98   70 - 99 (mg/dL)     BUN  11   6 - 23 (mg/dL)     Creatinine, Ser  9.56   0.50 - 1.10 (mg/dL)     Calcium  9.2   8.4 - 10.5 (mg/dL)     Total Protein  8.1   6.0 - 8.3 (g/dL)     Albumin  3.7   3.5 - 5.2 (g/dL)     AST  22   0 - 37 (U/L)     ALT  12   0 - 35 (U/L)     Alkaline Phosphatase  60   39 - 117 (U/L)     Total Bilirubin  0.4   0.3 - 1.2 (mg/dL)     GFR calc non Af Amer  >60   >60 (mL/min)     GFR calc Af Amer  >60   >60 (mL/min)   CK TOTAL AND CKMB     Collection Time     12/05/10  7:49 AM       Component  Value  Range     Total CK  164   7 - 177 (U/L)     CK, MB  2.0   0.3 - 4.0 (ng/mL)     Relative Index  1.2   0.0 - 2.5    LIPASE, BLOOD     Collection Time     12/05/10  7:49 AM       Component  Value  Range     Lipase  26   11 - 59 (U/L)   TROPONIN I     Collection Time     12/05/10  7:49 AM       Component   Value  Range     Troponin I  <0.30   <0.30 (ng/mL)    Past Medical History   Diagnosis  Date   .  Anemia         iron 1 yr ago, normal hgb 7/12   .  Asthma     .  Sinus drainage     .  Anxiety     .  Hyperlipidemia      Past Surgical History   Procedure  Date   .  Cholecystectomy  2005       cholelithiasis   .  Partial hysterectomy     .  Cesarean section         x2   .  Ovarian cyst removal      Current Outpatient Prescriptions   Medication  Sig  Dispense  Refill   .  omeprazole (PRILOSEC OTC) 20 MG tablet  Take 20 mg by mouth daily.           .  pravastatin (PRAVACHOL) 40 MG tablet  Take 40 mg by mouth daily.           .  sodium chloride (OCEAN) 0.65 % nasal spray  Place 1 spray into the nose as needed.           Marland Kitchen  dexlansoprazole (DEXILANT) 60 MG capsule  Take 1 capsule (60 mg total) by mouth daily.   31 capsule   2   .  hyoscyamine (LEVSIN/SL) 0.125 MG SL tablet  Place 1 tablet (0.125 mg total) under the tongue 4 (four) times daily -  with meals and at bedtime.   90 tablet   1    Allergies as of 01/13/2011 - Review Complete 01/13/2011   Allergen  Reaction  Noted   .  Lortab  Itching  01/13/2011   .  Percocet (oxycodone-acetaminophen)  Itching  01/13/2011    Family History:There is no known family history of colorectal carcinoma , liver disease, or inflammatory bowel disease.   Problem  Relation  Age of Onset   .  Diabetes  Father     .  Hypertension  Mother         History       Social History   .  Marital Status:  Married       Spouse Name:  N/A       Number of Children:  3   .  Years of Education:  N/A       Occupational History   .  [employer]         Social History Main Topics   .  Smoking status:  Never Smoker    .  Smokeless tobacco:  Not on file   .  Alcohol Use:  No   .  Drug Use:  No   .  Sexually Active:  Not on file      Review of Systems: Gen: Chronic insomnia, fatigue, weakness HEENT:  Multiple visits to 6 ENTs for chronic  sinus problems, ear pain CV: Denies chest pain, angina, palpitations, syncope, orthopnea, PND, peripheral edema, and claudication. Resp: Denies dyspnea at rest, dyspnea with exercise, cough, sputum, wheezing, coughing up blood, and pleurisy. GI: Denies vomiting blood, jaundice, and fecal incontinence.    GU : +microscopic hematuria w/ evaluation by urologist in [place], [place]. Benign Denies urinary burning, blood in urine, urinary frequency, urinary hesitancy, nocturnal urination, and urinary incontinence. MS: Denies joint pain, limitation of movement, and swelling, stiffness, low back pain, extremity pain. Denies muscle weakness, cramps, atrophy.   Derm: Denies rash, itching, dry skin, hives, moles, warts, or unhealing ulcers.   Psych: Denies depression, anxiety, memory loss, suicidal ideation, hallucinations, paranoia, and confusion. Heme: Denies bruising, bleeding, and enlarged lymph nodes.  Physical Exam: BP 108/66  Pulse 65  Temp(Src) 98.1 F (36.7 C) (Temporal)  Ht 5\' 4"  (1.626 m)  Wt 167 lb (75.751 kg)  BMI 28.67 kg/m2 General:   Alert,  Well-developed, well-nourished, pleasant and cooperative in NAD Head:  Normocephalic and atraumatic. Eyes:  Sclera clear, no icterus.   Conjunctiva pink. Ears:  Normal auditory acuity. Nose:  No deformity, discharge,  or lesions. Mouth:  No deformity or lesions, dentition normal. Neck:  Supple; no masses or thyromegaly. Lungs:  Clear throughout to auscultation.   No wheezes, crackles, or rhonchi. No acute distress. Heart:  Regular rate and rhythm; no murmurs, clicks, rubs,  or gallops. Abdomen:  Soft, nontender and nondistended. No masses, hepatosplenomegaly or hernias noted. Normal bowel sounds, without guarding, and without rebound.    Rectal:  Deferred until time of colonoscopy.    Msk:  Symmetrical without gross deformities. Normal posture. Pulses:  Normal pulses noted. Extremities:  Without clubbing or edema. Neurologic:  Alert and   oriented x4;  grossly normal neurologically. Skin:  Intact without significant lesions or rashes. Cervical Nodes:  No significant cervical adenopathy. Psych:  Alert and cooperative. Normal mood and affect.     Lorenza Burton, NP  01/14/2011  3:22 PM  Signed Quick Note:   Please call pt & let her know No celiac disease Suspect she had IBS as far as diarrhea/constipation Keep EGD as planned Thanks ______        Abdominal pain Lorenza Burton, NP  01/13/2011 10:58 PM  Addendum Yvette Mayer is a 40 y.o. female w/ chronic generalized abdominal pain & GERD.  CT & labs reassuring.  I suspect GERD & IBS (functional abdominal pain), but cannot r/o complicated GERD, PUD, gastritis or celiac disease.   Stop prilosec Start Dexilant 60mg  daily Use levsin before meals & bedtime (4 times per day) as needed for diarrhea/abd pain Go to lab as soon as possible--celiac panel/IgA     Previous Version  Dysphagia - Lorenza Burton, NP  01/13/2011 10:58 PM  Signed Will need EGD with possible esophageal dilation with Dr. Jena Gauss to r/o esophageal web, ring or stricture, as well as esophagitis, gastritis, PUD.   I have discussed risks & benefits which include, but are not limited to, bleeding, infection, perforation & drug reaction.  The patient agrees with this plan & written consent will be obtained.       I have seen the patient prior to the procedure(s) today and reviewed the history and physical / consultation from 01/13/11.  There have been no changes. Patient did not get her Dexilant filled because of cost. Currently on no acid suppression. She notes worsening of the above-mentioned symptoms in association with a 30 pound weight gain over the past 12 months. After consideration of the risks, benefits, alternatives and imponderables, the patient has consented to the procedure(s).

## 2011-01-23 NOTE — Op Note (Unsigned)
NAME:  Yvette Mayer, Yvette Mayer             ACCOUNT NO.:  [id number]  MEDICAL RECORD NO.:  [id number]  LOCATION:  [facility]                          FACILITY:  [facility]  PHYSICIAN:  R. Roetta Sessions, MD FACP FACGDATE OF BIRTH:  Mar 18, 1971  DATE OF PROCEDURE:  01/23/2011 DATE OF DISCHARGE:                              OPERATIVE REPORT   PROCEDURE:  Esophagogastroduodenoscopy with Elease Hashimoto dilation followed by biopsy.  INDICATIONS FOR PROCEDURE:  A 40 year old lady with worsening reflux symptoms in the setting of 30 pounds weight gain.  We saw her in the office recently.  We prescribed Dexilant.  She did not get it filled because of cost.  She also has esophageal dysphagia to solids and pills. EGD is now being done.  Risks, benefits, limitations, alternative, and imponderables have been discussed, questions were answered.  Please see the documentation medical record.  PROCEDURE NOTE:  O2 saturation, blood pressure, pulse, respirations, and sedation were monitored throughout the entire procedure.  CONSCIOUS SEDATION:  Versed 3 mg IV and Demerol 75 mg IV in divided doses.  Cetacaine spray for topical pharyngeal anesthesia.  INSTRUMENT:  Pentax video chip system.  FINDINGS:  Examination of the tubular esophagus revealed accentuated undulating Z-line versus early short segment Barrett esophagus.  The tubular esophagus was patent through the EG junction.  Stomach:  Gastric cavity was emptied and insufflated well with air. Thorough examination of the gastric mucosa including retroflexed and proximal stomach esophagogastric junction demonstrated only a small hiatal hernia.  Pylorus was patent, easily traversed, examination of the bulb second portion revealed no abnormalities.  Therapeutic/Diagnostic maneuvers performed.  Scope was withdrawn.  A 56-French Maloney dilators passed full insertion with ease.  A look back revealed no apparent complication related passage of the dilator.  Subsequent biopsies  of the salmon-colored epithelium taken for histologic study.  The patient tolerated the procedure well and was reactive in endoscopy.  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  RECOMMENDATIONS: 1. Continue with previous recommendations getting Dexilant 60 mg     orally daily, take 160 mg capsule daily, arrangements were made by     my office to get some free samples. 2. GERD literature. 3. Weight loss would be in the patient's best interest. 4. Follow up on path. 5. Follow up appointment with [place] in 6-8 weeks.     Jonathon Bellows, MD [facility]     RMR/MEDQ  D:  01/23/2011  T:  01/23/2011  Job:  [id number]

## 2011-01-28 ENCOUNTER — Encounter (HOSPITAL_COMMUNITY): Payer: Self-pay | Admitting: Internal Medicine

## 2011-02-04 ENCOUNTER — Encounter (HOSPITAL_COMMUNITY): Payer: Self-pay

## 2011-02-04 ENCOUNTER — Emergency Department (HOSPITAL_COMMUNITY)
Admission: EM | Admit: 2011-02-04 | Discharge: 2011-02-04 | Disposition: A | Discharge status: Home / Self Care | Payer: 59 | Attending: Emergency Medicine | Admitting: Emergency Medicine

## 2011-02-04 ENCOUNTER — Emergency Department (HOSPITAL_COMMUNITY): Payer: 59

## 2011-02-04 DIAGNOSIS — R200 Anesthesia of skin: Secondary | ICD-10-CM

## 2011-02-04 DIAGNOSIS — E785 Hyperlipidemia, unspecified: Secondary | ICD-10-CM | POA: Insufficient documentation

## 2011-02-04 DIAGNOSIS — IMO0001 Reserved for inherently not codable concepts without codable children: Secondary | ICD-10-CM | POA: Insufficient documentation

## 2011-02-04 DIAGNOSIS — Z862 Personal history of diseases of the blood and blood-forming organs and certain disorders involving the immune mechanism: Secondary | ICD-10-CM | POA: Insufficient documentation

## 2011-02-04 DIAGNOSIS — J45909 Unspecified asthma, uncomplicated: Secondary | ICD-10-CM | POA: Insufficient documentation

## 2011-02-04 DIAGNOSIS — R209 Unspecified disturbances of skin sensation: Secondary | ICD-10-CM | POA: Insufficient documentation

## 2011-02-04 DIAGNOSIS — K219 Gastro-esophageal reflux disease without esophagitis: Secondary | ICD-10-CM | POA: Insufficient documentation

## 2011-02-04 LAB — CBC
HCT: 36.2 % (ref 36.0–46.0)
Hemoglobin: 12.5 g/dL (ref 12.0–15.0)
RDW: 12.1 % (ref 11.5–15.5)
WBC: 5.2 10*3/uL (ref 4.0–10.5)

## 2011-02-04 LAB — BASIC METABOLIC PANEL
BUN: 7 mg/dL (ref 6–23)
Chloride: 103 mEq/L (ref 96–112)
GFR calc Af Amer: >60 mL/min (ref >60)
Potassium: 4.2 mEq/L (ref 3.5–5.1)

## 2011-02-04 NOTE — ED Notes (Signed)
Left arm numbness and tingling, since Monday, sensation came and went --tonight pain is constant.

## 2011-02-04 NOTE — ED Notes (Signed)
Pt also c/o left earache and "lots of mucus on my left side"

## 2011-02-04 NOTE — ED Provider Notes (Signed)
History     CSN: [id number] Arrival date & time: 02/04/2011  1:09 AM  Chief Complaint  Patient presents with  . Numbness   HPI Comments: Patient has a prior history of reflux disease and high cholesterol. She presents to the emergency department with a complaint of left arm and left leg numbness. She states that the symptoms started approximately 3 months ago, has been intermittent, and has gradually become worse. She notes today the symptoms have lasted for several hours. She feels a pins and needles sensation in her left arm, left face, left chest, left abdomen, left leg. She describes it as a restlessness but denies pain, weakness, ataxia, speech or vision disturbances. She denies fever, chills, rash, nausea, vomiting, cough, shortness of breath.  She does admit to mild left-sided sharp chest pain which is worse with pushing on her chest and taking a deep breath. She denies any trauma or injury, recent surgery, hormone therapy, pregnancy, immobilization. She has recently moved to refill but has a family care doctor in [place].  The history is provided by the patient.    Past Medical History  Diagnosis Date  . Anemia     iron 1 yr ago, normal hgb 7/12  . Asthma   . Sinus drainage   . Anxiety   . Hyperlipidemia   . Shortness of breath   . GERD (gastroesophageal reflux disease)   . Ovarian cyst, right   . Fibromyalgia     Past Surgical History  Procedure Date  . Partial hysterectomy 93  . Cesarean section     x2  . Ovarian cyst removal 92 removal of cysts from behind ovaries  . Cholecystectomy 2005    cholelithiasis  . Tonsillectomy     2010  . Septoplasty     2011  . Esophagogastroduodenoscopy 01/23/2011    Procedure: ESOPHAGOGASTRODUODENOSCOPY (EGD);  Surgeon: Corbin Ade, MD;  Location: [facility];  Service: Endoscopy;  Laterality: N/A;  9:00am  . Maloney dilation 01/23/2011    Procedure: Elease Hashimoto DILATION;  Surgeon: Corbin Ade, MD;  Location: [facility];   Service: Endoscopy;  Laterality: N/A;  . Upper gastrointestinal endoscopy     Family History  Problem Relation Age of Onset  . Diabetes Father   . Hypertension Mother   . Anesthesia problems Neg Hx   . Malignant hyperthermia Neg Hx   . Hypotension Neg Hx   . Pseudochol deficiency Neg Hx     History  Substance Use Topics  . Smoking status: Never Smoker   . Smokeless tobacco: Not on file  . Alcohol Use: No    OB History    Grav Para Term Preterm Abortions TAB SAB Ect Mult Living                  Review of Systems  All other systems reviewed and are negative.    Physical Exam  BP 103/67  Pulse 61  Temp(Src) 98.3 F (36.8 C) (Oral)  Resp 18  Ht 5\' 5"  (1.651 m)  Wt 164 lb (74.39 kg)  BMI 27.29 kg/m2  SpO2 100%  Physical Exam  Nursing note and vitals reviewed. Constitutional: She appears well-developed and well-nourished. No distress.  HENT:  Head: Normocephalic and atraumatic.  Mouth/Throat: Oropharynx is clear and moist. No oropharyngeal exudate.  Eyes: Conjunctivae and EOM are normal. Pupils are equal, round, and reactive to light. Right eye exhibits no discharge. Left eye exhibits no discharge. No scleral icterus.  Neck: Normal range of  motion. Neck supple. No JVD present. No thyromegaly present.  Cardiovascular: Normal rate, regular rhythm, normal heart sounds and intact distal pulses.  Exam reveals no gallop and no friction rub.   No murmur heard. Pulmonary/Chest: Effort normal and breath sounds normal. No respiratory distress. She has no wheezes. She has no rales. She exhibits tenderness (reproducible tenderness to palpation over the upper left chest wall. No crepitus or subcutaneous emphysema.).  Abdominal: Soft. Bowel sounds are normal. She exhibits no distension and no mass. There is no tenderness.  Musculoskeletal: Normal range of motion. She exhibits no edema and no tenderness.       No edema of the lower or upper extremities. No asymmetry. Normal range  of motion of all joints.  Lymphadenopathy:    She has no cervical adenopathy.  Neurological: She is alert. Coordination normal.       Patient describes decreased sensation to light touch of the left face, shoulder, upper extremity, lower extremity, hip, side, chest on the left. She has normal motor function of the complete left side including cranial nerves III through XII. She has no deficits on the right side. Gait is normal, speech is normal, peripheral visual fields and gross visual acuity are normal.  Skin: Skin is warm and dry. No rash noted. No erythema.  Psychiatric: She has a normal mood and affect. Her behavior is normal.    ED Course  Procedures  MDM Patient appears well with normal vital signs and essentially unremarkable exam other than the sensory deficit. This has been waxing and waning for approximately 3 months and seems to be more in the left arm. She describes the symptoms as coming on and off at rest or exertion and is unpredictable. She has never had any weakness associated with these tingling spells. She denies cough or shortness of breath.  Will evaluate with basic metabolic panel CBC to rule out a i disturbances or anemia and a CT scan of the head to rule out stroke though this is less likely given the rest of her exam and the history of the waxing and waning symptoms for months. This could be related to a radiculopathy   Results for orders placed during the hospital encounter of 02/04/11  CBC      Component Value Range   WBC 5.2  4.0 - 10.5 (K/uL)   RBC 3.98  3.87 - 5.11 (MIL/uL)   Hemoglobin 12.5  12.0 - 15.0 (g/dL)   HCT 16.1  09.6 - 04.5 (%)   MCV 91.0  78.0 - 100.0 (fL)   MCH 31.4  26.0 - 34.0 (pg)   MCHC 34.5  30.0 - 36.0 (g/dL)   RDW 40.9  81.1 - 91.4 (%)   Platelets 276  150 - 400 (K/uL)  BASIC METABOLIC PANEL      Component Value Range   Sodium 136  135 - 145 (mEq/L)   Potassium 4.2  3.5 - 5.1 (mEq/L)   Chloride 103  96 - 112 (mEq/L)   CO2 29  19 - 32  (mEq/L)   Glucose, Bld 98  70 - 99 (mg/dL)   BUN 7  6 - 23 (mg/dL)   Creatinine, Ser 7.82  0.50 - 1.10 (mg/dL)   Calcium 9.4  8.4 - 95.6 (mg/dL)   GFR calc non Af Amer >60  >60 (mL/min)   GFR calc Af Amer >60  >60 (mL/min)   Ct Head Wo Contrast  02/04/2011  *RADIOLOGY REPORT*  Clinical Data: Left-sided head numbness and headache.  CT HEAD WITHOUT CONTRAST  Technique:  Contiguous axial images were obtained from the base of the skull through the vertex without contrast.  Comparison: 02/07/2008  Findings: Mild cortical atrophy.  No mass effect or midline shift. No sulci effacement.  No abnormal extra-axial fluid collections. Gray-white matter junctions are distinct.  Basal cisterns are not effaced.  No evidence of acute intracranial hemorrhage.  No significant changes since the previous study.  No depressed skull fractures.  Visualized paranasal sinuses and mastoid air cells are not opacified.  IMPRESSION: No evidence of acute intracranial hemorrhage, mass lesion, or acute infarct.  Original Report Authenticated By: Marlon Pel, M.D.       Patient now states that she has been seen by multiple ENT providers and a neurologist in the past to be worked up for her left-sided numbness. She states that they have done MRIs, CAT scans and other testing and nothing has shown an answer. I discussed with her the results here in the emergency department and encouraged to followup closely with her doctor.  Vida Roller, MD 02/04/11 [phone]

## 2011-02-13 ENCOUNTER — Emergency Department (HOSPITAL_COMMUNITY)
Admission: EM | Admit: 2011-02-13 | Discharge: 2011-02-13 | Disposition: A | Discharge status: Home / Self Care | Payer: 59 | Attending: Emergency Medicine | Admitting: Emergency Medicine

## 2011-02-13 ENCOUNTER — Other Ambulatory Visit: Payer: Self-pay

## 2011-02-13 ENCOUNTER — Emergency Department (HOSPITAL_COMMUNITY): Payer: 59

## 2011-02-13 ENCOUNTER — Encounter (HOSPITAL_COMMUNITY): Payer: Self-pay | Admitting: *Deleted

## 2011-02-13 DIAGNOSIS — J45909 Unspecified asthma, uncomplicated: Secondary | ICD-10-CM | POA: Insufficient documentation

## 2011-02-13 DIAGNOSIS — J329 Chronic sinusitis, unspecified: Secondary | ICD-10-CM | POA: Insufficient documentation

## 2011-02-13 DIAGNOSIS — E785 Hyperlipidemia, unspecified: Secondary | ICD-10-CM | POA: Insufficient documentation

## 2011-02-13 DIAGNOSIS — R079 Chest pain, unspecified: Secondary | ICD-10-CM | POA: Insufficient documentation

## 2011-02-13 DIAGNOSIS — IMO0001 Reserved for inherently not codable concepts without codable children: Secondary | ICD-10-CM | POA: Insufficient documentation

## 2011-02-13 LAB — BASIC METABOLIC PANEL
CO2: 27 mEq/L (ref 19–32)
Calcium: 9.7 mg/dL (ref 8.4–10.5)
Chloride: 101 mEq/L (ref 96–112)
Glucose, Bld: 96 mg/dL (ref 70–99)
Potassium: 3.6 mEq/L (ref 3.5–5.1)
Sodium: 136 mEq/L (ref 135–145)

## 2011-02-13 LAB — POCT I-STAT, CHEM 8
Calcium, Ion: 1.19 mmol/L (ref 1.12–1.32)
Chloride: 103 mEq/L (ref 96–112)
Glucose, Bld: 86 mg/dL (ref 70–99)
HCT: 43 % (ref 36.0–46.0)
Hemoglobin: 14.6 g/dL (ref 12.0–15.0)
TCO2: 24 mmol/L (ref 0–100)

## 2011-02-13 LAB — TSH: TSH: 6.268 u[IU]/mL — ABNORMAL HIGH (ref 0.350–4.500)

## 2011-02-13 LAB — POCT I-STAT TROPONIN I: Troponin i, poc: 0 ng/mL (ref 0.00–0.08)

## 2011-02-13 LAB — CARDIAC PANEL(CRET KIN+CKTOT+MB+TROPI)
CK, MB: 1.8 ng/mL (ref 0.3–4.0)
Relative Index: 1 (ref 0.0–2.5)
Total CK: 177 U/L (ref 7–177)
Troponin I: <0.3 ng/mL (ref <0.30)

## 2011-02-13 LAB — LIPID PANEL: LDL Cholesterol: 92 mg/dL (ref 0–99)

## 2011-02-13 MED ORDER — SODIUM CHLORIDE 0.9 % IV SOLN: Freq: Once | INTRAVENOUS | Status: DC

## 2011-02-13 MED ORDER — FAMOTIDINE 20 MG PO TABS: 20.0000 mg | ORAL_TABLET | Freq: Two times a day (BID) | ORAL | Status: DC

## 2011-02-13 MED ORDER — IOHEXOL 300 MG/ML  SOLN
100.0000 mL | Freq: Once | INTRAMUSCULAR | Status: AC | PRN
  Administered 2011-02-13: 100 mL via INTRAVENOUS

## 2011-02-13 NOTE — ED Notes (Signed)
Pt left er stating no needs 

## 2011-02-13 NOTE — ED Provider Notes (Signed)
History     CSN: [id number] Arrival date & time: 02/13/2011  1:31 AM   Chief Complaint  Patient presents with  . Chest Pain     (Include location/radiation/quality/duration/timing/severity/associated sxs/prior treatment) HPI Comments: Patient is a 40 year old female with a history of chronic sinusitis and left-sided numbness and intermittent chest pains. She presents with what she states is constant sternal and left sided sharp chest pain which has been constant for one month. This may get worse with lying down, it is worse with deep breathing and makes her feel short of breath. She denies nausea, vomiting, fevers, swelling, rash, however she admits to having chronic fatigue.. She has been worked up extensively by the emergency department for left-sided numbness and by her primary care doctor without an answer for her symptoms.  Patient is a 40 y.o. female presenting with chest pain. The history is provided by the patient and medical records.  Chest Pain      Past Medical History  Diagnosis Date  . Anemia     iron 1 yr ago, normal hgb 7/12  . Asthma   . Sinus drainage   . Anxiety   . Hyperlipidemia   . Shortness of breath   . GERD (gastroesophageal reflux disease)   . Ovarian cyst, right   . Fibromyalgia      Past Surgical History  Procedure Date  . Partial hysterectomy 93  . Cesarean section     x2  . Ovarian cyst removal 92 removal of cysts from behind ovaries  . Cholecystectomy 2005    cholelithiasis  . Tonsillectomy     2010  . Septoplasty     2011  . Esophagogastroduodenoscopy 01/23/2011    Procedure: ESOPHAGOGASTRODUODENOSCOPY (EGD);  Surgeon: Corbin Ade, MD;  [facility];  Service: Endoscopy;  Laterality: N/A;  9:00am  . Maloney dilation 01/23/2011    Procedure: Elease Hashimoto DILATION;  Surgeon: Corbin Ade, MD;  Location: [facility];  Service: Endoscopy;  Laterality: N/A;  . Upper gastrointestinal endoscopy     Family History  Problem  Relation Age of Onset  . Diabetes Father   . Hypertension Mother   . Anesthesia problems Neg Hx   . Malignant hyperthermia Neg Hx   . Hypotension Neg Hx   . Pseudochol deficiency Neg Hx     History  Substance Use Topics  . Smoking status: Never Smoker   . Smokeless tobacco: Not on file  . Alcohol Use: No    OB History    Grav Para Term Preterm Abortions TAB SAB Ect Mult Living                  Review of Systems  Cardiovascular: Positive for chest pain.  All other systems reviewed and are negative.    Allergies  Lortab and Percocet  Home Medications   Current Outpatient Rx  Name Route Sig Dispense Refill  . OMEPRAZOLE MAGNESIUM 20 MG PO TBEC Oral Take 20 mg by mouth daily.      Marland Kitchen PRAVASTATIN SODIUM 40 MG PO TABS Oral Take 40 mg by mouth daily.      . DEXLANSOPRAZOLE 60 MG PO CPDR Oral Take 1 capsule (60 mg total) by mouth daily. 31 capsule 2  . SALINE NASAL SPRAY 0.65 % NA SOLN Nasal Place 1 spray into the nose as needed.        Physical Exam    BP 139/90  Pulse 60  Temp(Src) 97.7 F (36.5 C) (Oral)  Wt  166 lb (75.297 kg)  SpO2 100%  Physical Exam  Nursing note and vitals reviewed. Constitutional: She appears well-developed and well-nourished. No distress.  HENT:  Head: Normocephalic and atraumatic.  Mouth/Throat: Oropharynx is clear and moist. No oropharyngeal exudate.  Eyes: Conjunctivae and EOM are normal. Pupils are equal, round, and reactive to light. Right eye exhibits no discharge. Left eye exhibits no discharge. No scleral icterus.  Neck: Normal range of motion. Neck supple. No JVD present. No thyromegaly present.  Cardiovascular: Normal rate, regular rhythm, normal heart sounds and intact distal pulses.  Exam reveals no gallop and no friction rub.   No murmur heard. Pulmonary/Chest: Effort normal and breath sounds normal. No respiratory distress. She has no wheezes. She has no rales.  Abdominal: Soft. Bowel sounds are normal. She exhibits no  distension and no mass. There is no tenderness.  Musculoskeletal: Normal range of motion. She exhibits no edema and no tenderness.  Lymphadenopathy:    She has no cervical adenopathy.  Neurological: She is alert. Coordination normal.  Skin: Skin is warm and dry. No rash noted. No erythema.  Psychiatric: She has a normal mood and affect. Her behavior is normal.       Tearful    ED Course  Procedures  Results for orders placed during the hospital encounter of 02/13/11  POCT I-STAT, CHEM 8      Component Value Range   Sodium 139  135 - 145 (mEq/L)   Potassium 3.8  3.5 - 5.1 (mEq/L)   Chloride 103  96 - 112 (mEq/L)   BUN 15  6 - 23 (mg/dL)   Creatinine, Ser 7.82  0.50 - 1.10 (mg/dL)   Glucose, Bld 86  70 - 99 (mg/dL)   Calcium, Ion 9.56  2.13 - 1.32 (mmol/L)   TCO2 24  0 - 100 (mmol/L)   Hemoglobin 14.6  12.0 - 15.0 (g/dL)   HCT 08.6  57.8 - 46.9 (%)  BASIC METABOLIC PANEL      Component Value Range   Sodium 136  135 - 145 (mEq/L)   Potassium 3.6  3.5 - 5.1 (mEq/L)   Chloride 101  96 - 112 (mEq/L)   CO2 27  19 - 32 (mEq/L)   Glucose, Bld 96  70 - 99 (mg/dL)   BUN 14  6 - 23 (mg/dL)   Creatinine, Ser 6.29  0.50 - 1.10 (mg/dL)   Calcium 9.7  8.4 - 52.8 (mg/dL)   GFR calc non Af Amer >60  >60 (mL/min)   GFR calc Af Amer >60  >60 (mL/min)  CARDIAC PANEL(CRET KIN+CKTOT+MB+TROPI)      Component Value Range   Total CK 177  7 - 177 (U/L)   CK, MB 1.8  0.3 - 4.0 (ng/mL)   Troponin I <0.30  <0.30 (ng/mL)   Relative Index 1.0  0.0 - 2.5   POCT I-STAT TROPONIN I      Component Value Range   Troponin i, poc 0.00  0.00 - 0.08 (ng/mL)   Comment 3              CT negative for PE   MDM Chronic chest pain, no etiology found in the past. CT angiogram of the chest done in 2009 without pulmonary embolism. We'll repeat test today as EKG did not reveal source. No murmur or rub or EKG findings of pericarditis  ED ECG REPORT   Date: 02/13/2011   Rate: 66  Rhythm: normal sinus rhythm   QRS Axis: normal  Intervals: normal  ST/T Wave abnormalities: normal  Conduction Disutrbances:none  Narrative Interpretation: c/w 12/05/10  Old EKG Reviewed: unchanged    Sx gone - feeling getter - has chronic problems with sinuses - referred back to ENT.    Vida Roller, MD 02/13/11 [phone]

## 2011-02-13 NOTE — ED Notes (Signed)
Chest pain for 2 days, nausea,

## 2011-02-17 ENCOUNTER — Telehealth: Payer: Self-pay

## 2011-02-17 NOTE — Telephone Encounter (Signed)
Needs OV to discuss

## 2011-02-17 NOTE — Telephone Encounter (Signed)
Pt called- Left voicemail. She is having a lot of gas and has not had a BM x 1 week. Pt stated she took a laxative today and it didn't help. Tried to call pt back to get more details and had to leave message for return call.

## 2011-02-18 NOTE — Telephone Encounter (Signed)
Please schedule pt appointment.

## 2011-02-19 NOTE — Telephone Encounter (Signed)
Pt is aware of OV for 10/5 at 1030 with LSL

## 2011-02-27 LAB — BASIC METABOLIC PANEL
BUN: 8
Creatinine, Ser: 0.74
GFR calc non Af Amer: >60
Glucose, Bld: 107 — ABNORMAL HIGH
Potassium: 3.3 — ABNORMAL LOW

## 2011-02-27 LAB — D-DIMER, QUANTITATIVE: D-Dimer, Quant: 0.93 — ABNORMAL HIGH

## 2011-02-27 LAB — CBC
HCT: 33.6 — ABNORMAL LOW
MCV: 91
Platelets: 339
RDW: 11.9

## 2011-02-27 LAB — DIFFERENTIAL
Basophils Absolute: 0.1
Eosinophils Absolute: 0.1
Eosinophils Relative: 2
Lymphocytes Relative: 36
Neutrophils Relative %: 51

## 2011-03-03 LAB — POCT I-STAT, CHEM 8
Calcium, Ion: 0.96 — ABNORMAL LOW
HCT: 42
Sodium: 134 — ABNORMAL LOW
TCO2: 24

## 2011-03-03 LAB — BASIC METABOLIC PANEL
Calcium: 9.1
Creatinine, Ser: 0.69
GFR calc Af Amer: >60
GFR calc non Af Amer: >60
Sodium: 137

## 2011-03-06 ENCOUNTER — Ambulatory Visit: Payer: 59 | Admitting: Gastroenterology

## 2011-03-10 ENCOUNTER — Ambulatory Visit: Payer: 59 | Admitting: Urgent Care

## 2011-03-12 ENCOUNTER — Ambulatory Visit: Payer: 59 | Admitting: Urgent Care

## 2011-05-22 ENCOUNTER — Encounter (HOSPITAL_COMMUNITY): Payer: Self-pay | Admitting: Emergency Medicine

## 2011-05-22 ENCOUNTER — Emergency Department (HOSPITAL_COMMUNITY)
Admission: EM | Admit: 2011-05-22 | Discharge: 2011-05-22 | Disposition: A | Discharge status: Home / Self Care | Payer: 59 | Attending: Emergency Medicine | Admitting: Emergency Medicine

## 2011-05-22 DIAGNOSIS — R05 Cough: Secondary | ICD-10-CM | POA: Insufficient documentation

## 2011-05-22 DIAGNOSIS — R059 Cough, unspecified: Secondary | ICD-10-CM | POA: Insufficient documentation

## 2011-05-22 DIAGNOSIS — J3489 Other specified disorders of nose and nasal sinuses: Secondary | ICD-10-CM | POA: Insufficient documentation

## 2011-05-22 DIAGNOSIS — IMO0001 Reserved for inherently not codable concepts without codable children: Secondary | ICD-10-CM | POA: Insufficient documentation

## 2011-05-22 DIAGNOSIS — R071 Chest pain on breathing: Secondary | ICD-10-CM | POA: Insufficient documentation

## 2011-05-22 DIAGNOSIS — K219 Gastro-esophageal reflux disease without esophagitis: Secondary | ICD-10-CM | POA: Insufficient documentation

## 2011-05-22 DIAGNOSIS — F411 Generalized anxiety disorder: Secondary | ICD-10-CM | POA: Insufficient documentation

## 2011-05-22 DIAGNOSIS — J45909 Unspecified asthma, uncomplicated: Secondary | ICD-10-CM | POA: Insufficient documentation

## 2011-05-22 DIAGNOSIS — R51 Headache: Secondary | ICD-10-CM | POA: Insufficient documentation

## 2011-05-22 DIAGNOSIS — J111 Influenza due to unidentified influenza virus with other respiratory manifestations: Secondary | ICD-10-CM | POA: Insufficient documentation

## 2011-05-22 DIAGNOSIS — R6883 Chills (without fever): Secondary | ICD-10-CM | POA: Insufficient documentation

## 2011-05-22 DIAGNOSIS — Z9889 Other specified postprocedural states: Secondary | ICD-10-CM | POA: Insufficient documentation

## 2011-05-22 MED ORDER — HYDROCOD POLST-CHLORPHEN POLST 10-8 MG/5ML PO LQCR: 5.0000 mL | Freq: Two times a day (BID) | ORAL | Status: DC

## 2011-05-22 NOTE — ED Notes (Signed)
Pt a/ox4. Resp even and unlabored. NAD at this time. D/C instructions and rx x1 reviewed with pt. Pt verbalized understanding. Pt ambulated to lobby with steady gate.  

## 2011-05-22 NOTE — ED Notes (Signed)
Pt c/o body aches, cough, and headaches since Wed.

## 2011-05-22 NOTE — ED Provider Notes (Signed)
History     CSN: [id number]  Arrival date & time 05/22/11  1104   First MD Initiated Contact with Patient 05/22/11 1358      Chief Complaint  Patient presents with  . Generalized Body Aches  . Nasal Congestion  . Cough  . Headache    (Consider location/radiation/quality/duration/timing/severity/associated sxs/prior treatment) Patient is a 40 y.o. female presenting with cough and headaches. The history is provided by the patient.  Cough This is a new problem. The problem occurs hourly. The problem has been gradually worsening. The cough is productive of sputum. Associated symptoms include chills, headaches and myalgias. Pertinent negatives include no chest pain, no shortness of breath and no wheezing. Treatments tried: Tylenol, ibuprofen, over-the-counter cough medication.  Headache  Pertinent negatives include no palpitations and no shortness of breath.    Past Medical History  Diagnosis Date  . Anemia     iron 1 yr ago, normal hgb 7/12  . Asthma   . Sinus drainage   . Anxiety   . Hyperlipidemia   . Shortness of breath   . GERD (gastroesophageal reflux disease)   . Ovarian cyst, right   . Fibromyalgia     Past Surgical History  Procedure Date  . Partial hysterectomy 93  . Cesarean section     x2  . Ovarian cyst removal 92 removal of cysts from behind ovaries  . Cholecystectomy 2005    cholelithiasis  . Tonsillectomy     2010  . Septoplasty     2011  . Esophagogastroduodenoscopy 01/23/2011    Procedure: ESOPHAGOGASTRODUODENOSCOPY (EGD);  Surgeon: Corbin Ade, MD;  [facility];  Service: Endoscopy;  Laterality: N/A;  9:00am  . Maloney dilation 01/23/2011    Procedure: Elease Hashimoto DILATION;  Surgeon: Corbin Ade, MD;  [facility];  Service: Endoscopy;  Laterality: N/A;  . Upper gastrointestinal endoscopy     Family History  Problem Relation Age of Onset  . Diabetes Father   . Hypertension Mother   . Anesthesia problems Neg Hx   .  Malignant hyperthermia Neg Hx   . Hypotension Neg Hx   . Pseudochol deficiency Neg Hx     History  Substance Use Topics  . Smoking status: Never Smoker   . Smokeless tobacco: Not on file  . Alcohol Use: No    OB History    Grav Para Term Preterm Abortions TAB SAB Ect Mult Living                  Review of Systems  Constitutional: Positive for chills. Negative for activity change.       All ROS Neg except as noted in HPI  HENT: Negative for nosebleeds and neck pain.   Eyes: Negative for photophobia and discharge.  Respiratory: Positive for cough. Negative for shortness of breath and wheezing.   Cardiovascular: Negative for chest pain and palpitations.  Gastrointestinal: Negative for abdominal pain and blood in stool.  Genitourinary: Negative for dysuria, frequency and hematuria.  Musculoskeletal: Positive for myalgias. Negative for back pain and arthralgias.  Skin: Negative.   Neurological: Positive for headaches. Negative for dizziness, seizures and speech difficulty.  Psychiatric/Behavioral: Negative for hallucinations and confusion.    Allergies  Lortab and Percocet  Home Medications   Current Outpatient Rx  Name Route Sig Dispense Refill  . ACETAMINOPHEN 160 MG/5ML PO SUSP Oral Take 500 mg by mouth every 4 (four) hours as needed. For pain/fever     . VITAMIN D  1000 UNITS PO TABS Oral Take 1,000 Units by mouth daily.      . DEXLANSOPRAZOLE 30 MG PO CPDR Oral Take 30 mg by mouth daily as needed. For indigestion     . IBUPROFEN 200 MG PO TABS Oral Take 200 mg by mouth every 6 (six) hours as needed. For fever/pain     . ALKA-SELTZER PLUS COLD & COUGH PO Oral Take 1 capsule by mouth 2 (two) times daily.        BP 98/68  Pulse 86  Temp(Src) 98.4 F (36.9 C) (Oral)  Resp 18  Ht 5\' 5"  (1.651 m)  Wt 160 lb (72.576 kg)  BMI 26.63 kg/m2  SpO2 100%  Physical Exam  Nursing note and vitals reviewed. Constitutional: She is oriented to person, place, and time. She  appears well-developed and well-nourished.  Non-toxic appearance.  HENT:  Head: Normocephalic.  Right Ear: Tympanic membrane and external ear normal.  Left Ear: Tympanic membrane and external ear normal.  Eyes: EOM and lids are normal. Pupils are equal, round, and reactive to light.  Neck: Normal range of motion. Neck supple. Carotid bruit is not present.  Cardiovascular: Normal rate, regular rhythm, normal heart sounds, intact distal pulses and normal pulses.  Exam reveals no friction rub.   No murmur heard. Pulmonary/Chest: Effort normal and breath sounds normal. No respiratory distress. She exhibits tenderness.       bilat lower chest wall tenderness.  Abdominal: Soft. Bowel sounds are normal. There is no tenderness. There is no guarding.  Musculoskeletal: Normal range of motion.  Lymphadenopathy:       Head (right side): No submandibular adenopathy present.       Head (left side): No submandibular adenopathy present.    She has no cervical adenopathy.  Neurological: She is alert and oriented to person, place, and time. She has normal strength. No cranial nerve deficit or sensory deficit.  Skin: Skin is warm and dry.  Psychiatric: She has a normal mood and affect. Her speech is normal.    ED Course  Procedures (including critical care time)  Labs Reviewed - No data to display No results found.   No diagnosis found.    MDM  I have reviewed nursing notes, vital signs, and all appropriate lab and imaging results for this patient.  Results for orders placed during the hospital encounter of 02/13/11  POCT I-STAT, CHEM 8      Component Value Range   Sodium 139  135 - 145 (mEq/L)   Potassium 3.8  3.5 - 5.1 (mEq/L)   Chloride 103  96 - 112 (mEq/L)   BUN 15  6 - 23 (mg/dL)   Creatinine, Ser 1.61  0.50 - 1.10 (mg/dL)   Glucose, Bld 86  70 - 99 (mg/dL)   Calcium, Ion 0.96  0.45 - 1.32 (mmol/L)   TCO2 24  0 - 100 (mmol/L)   Hemoglobin 14.6  12.0 - 15.0 (g/dL)   HCT 40.9  81.1 -  91.4 (%)  TSH      Component Value Range   TSH 6.268 (*) 0.350 - 4.500 (uIU/mL)  LIPID PANEL      Component Value Range   Cholesterol 175  0 - 200 (mg/dL)   Triglycerides 782  <956 (mg/dL)   HDL 59  >21 (mg/dL)   Total CHOL/HDL Ratio 3.0     VLDL 24  0 - 40 (mg/dL)   LDL Cholesterol 92  0 - 99 (mg/dL)  BASIC METABOLIC PANEL  Component Value Range   Sodium 136  135 - 145 (mEq/L)   Potassium 3.6  3.5 - 5.1 (mEq/L)   Chloride 101  96 - 112 (mEq/L)   CO2 27  19 - 32 (mEq/L)   Glucose, Bld 96  70 - 99 (mg/dL)   BUN 14  6 - 23 (mg/dL)   Creatinine, Ser 1.61  0.50 - 1.10 (mg/dL)   Calcium 9.7  8.4 - 09.6 (mg/dL)   GFR calc non Af Amer >60  >60 (mL/min)   GFR calc Af Amer >60  >60 (mL/min)  CARDIAC PANEL(CRET KIN+CKTOT+MB+TROPI)      Component Value Range   Total CK 177  7 - 177 (U/L)   CK, MB 1.8  0.3 - 4.0 (ng/mL)   Troponin I <0.30  <0.30 (ng/mL)   Relative Index 1.0  0.0 - 2.5   POCT I-STAT TROPONIN I      Component Value Range   Troponin i, poc 0.00  0.00 - 0.08 (ng/mL)   Comment 3            No results found.       Kathie Dike, [place] 05/22/11 [phone]

## 2011-05-22 NOTE — ED Notes (Signed)
Pt presents with cough, congestion, and body aches since Sunday. Pt states her symptoms started worsening Wednesday and pt has been out of work since then. Pt states she has been coughing up green phlegm. Lungs clear. Pt states she has been taking liquid Tylenol and Alka Seltzer Cold and Sinus but symptoms are not improving. NAD at this time.

## 2011-05-23 NOTE — ED Provider Notes (Signed)
Medical screening examination/treatment/procedure(s) were performed by non-physician practitioner and as supervising physician I was immediately available for consultation/collaboration.    Nelia Shi, MD 05/23/11 [phone]

## 2011-08-23 ENCOUNTER — Encounter (HOSPITAL_COMMUNITY): Payer: Self-pay

## 2011-08-23 ENCOUNTER — Emergency Department (HOSPITAL_COMMUNITY)
Admission: EM | Admit: 2011-08-23 | Discharge: 2011-08-23 | Disposition: A | Discharge status: Home / Self Care | Payer: 59 | Attending: Emergency Medicine | Admitting: Emergency Medicine

## 2011-08-23 DIAGNOSIS — R109 Unspecified abdominal pain: Secondary | ICD-10-CM | POA: Insufficient documentation

## 2011-08-23 DIAGNOSIS — K219 Gastro-esophageal reflux disease without esophagitis: Secondary | ICD-10-CM | POA: Insufficient documentation

## 2011-08-23 DIAGNOSIS — Z9079 Acquired absence of other genital organ(s): Secondary | ICD-10-CM | POA: Insufficient documentation

## 2011-08-23 DIAGNOSIS — M549 Dorsalgia, unspecified: Secondary | ICD-10-CM | POA: Insufficient documentation

## 2011-08-23 DIAGNOSIS — J45909 Unspecified asthma, uncomplicated: Secondary | ICD-10-CM | POA: Insufficient documentation

## 2011-08-23 DIAGNOSIS — IMO0001 Reserved for inherently not codable concepts without codable children: Secondary | ICD-10-CM | POA: Insufficient documentation

## 2011-08-23 LAB — URINALYSIS, ROUTINE W REFLEX MICROSCOPIC
Leukocytes, UA: NEGATIVE
Nitrite: NEGATIVE
Protein, ur: NEGATIVE mg/dL
Urobilinogen, UA: 0.2 mg/dL (ref 0.0–1.0)

## 2011-08-23 MED ORDER — ACETAMINOPHEN 325 MG PO TABS
650.0000 mg | ORAL_TABLET | Freq: Once | ORAL | Status: AC
  Administered 2011-08-23: 650 mg via ORAL
  Filled 2011-08-23: qty 2

## 2011-08-23 MED ORDER — ONDANSETRON 8 MG PO TBDP
8.0000 mg | ORAL_TABLET | Freq: Once | ORAL | Status: AC
  Administered 2011-08-23: 8 mg via ORAL
  Filled 2011-08-23: qty 1

## 2011-08-23 NOTE — Discharge Instructions (Signed)
Abdominal (belly) pain can be caused by many things. any cases can be observed and treated at home after initial evaluation in the emergency department. Even though you are being discharged home, abdominal pain can be unpredictable. Therefore, you need a repeated exam if your pain does not resolve, returns, or worsens. Most patients with abdominal pain don't have to be admitted to the hospital or have surgery, but serious problems like appendicitis and gallbladder attacks can start out as nonspecific pain. Many abdominal conditions cannot be diagnosed in one visit, so follow-up evaluations are very important. SEEK IMMEDIATE MEDICAL ATTENTION IF: The pain does not go away or becomes severe, particularly over the next 8-12 hours.  A temperature above 100.4F develops.  Repeated vomiting occurs (multiple episodes).  The pain becomes localized to portions of the abdomen. The right side could possibly be appendicitis. In an adult, the left lower portion of the abdomen could be colitis or diverticulitis.  Blood is being passed in stools or vomit (bright red or black tarry stools).  Return also if you develop chest pain, difficulty breathing, dizziness or fainting, or become confused, poorly responsive,  

## 2011-08-23 NOTE — ED Provider Notes (Signed)
History   This chart was scribed for Joya Gaskins, MD by Melba Coon. The patient was seen in room [facility] and the patient's care was started at 9:52AM.    CSN: [id number]  Arrival date & time 08/23/11  7829   First MD Initiated Contact with Patient 08/23/11 [phone]      Chief Complaint  Patient presents with  . Abdominal Pain     HPI Yvette Mayer is a 41 y.o. female who presents to the Emergency Department complaining of constant, moderate to severe abdominal pain with an onset this morning and back pain with an onset 5 days ago. Pt states that, in her back, she "feels like something is moving" in it. Pt has applied topical Icy-Hot on her back. Diarrhea 2-3 x present. No HA, neck pain, fever, chills, CP, abd pain, n/v, or extremity pain, numbness, or tingling. Hx of partial hysterectomy, cholecystectomy (gall stone removal), and kidney and ovarian cysts No other pertinent medical problems.   Past Medical History  Diagnosis Date  . Anemia     iron 1 yr ago, normal hgb 7/12  . Asthma   . Sinus drainage   . Anxiety   . Hyperlipidemia   . Shortness of breath   . GERD (gastroesophageal reflux disease)   . Ovarian cyst, right   . Fibromyalgia     Past Surgical History  Procedure Date  . Partial hysterectomy 93  . Cesarean section     x2  . Ovarian cyst removal 92 removal of cysts from behind ovaries  . Cholecystectomy 2005    cholelithiasis  . Tonsillectomy     2010  . Septoplasty     2011  . Esophagogastroduodenoscopy 01/23/2011    Procedure: ESOPHAGOGASTRODUODENOSCOPY (EGD);  Surgeon: Corbin Ade, MD;  [facility];  Service: Endoscopy;  Laterality: N/A;  9:00am  . Maloney dilation 01/23/2011    Procedure: Elease Hashimoto DILATION;  Surgeon: Corbin Ade, MD;  [facility];  Service: Endoscopy;  Laterality: N/A;  . Upper gastrointestinal endoscopy     Family History  Problem Relation Age of Onset  . Diabetes Father   . Hypertension  Mother   . Anesthesia problems Neg Hx   . Malignant hyperthermia Neg Hx   . Hypotension Neg Hx   . Pseudochol deficiency Neg Hx     History  Substance Use Topics  . Smoking status: Never Smoker   . Smokeless tobacco: Not on file  . Alcohol Use: No    OB History    Grav Para Term Preterm Abortions TAB SAB Ect Mult Living                  Review of Systems 10 Systems reviewed and are negative for acute change except as noted in the HPI.  Allergies  Lortab and Percocet  Home Medications   Current Outpatient Rx  Name Route Sig Dispense Refill  . ACETAMINOPHEN 160 MG/5ML PO SUSP Oral Take 500 mg by mouth every 4 (four) hours as needed. For pain/fever     . HYDROCOD POLST-CPM POLST ER 10-8 MG/5ML PO LQCR Oral Take 5 mLs by mouth every 12 (twelve) hours. 100 mL 0  . VITAMIN D 1000 UNITS PO TABS Oral Take 1,000 Units by mouth daily.      . DEXLANSOPRAZOLE 30 MG PO CPDR Oral Take 30 mg by mouth daily as needed. For indigestion     . IBUPROFEN 200 MG PO TABS Oral Take 200 mg  by mouth every 6 (six) hours as needed. For fever/pain     . ALKA-SELTZER PLUS COLD & COUGH PO Oral Take 1 capsule by mouth 2 (two) times daily.        BP 100/68  Pulse 73  Temp(Src) 97.2 F (36.2 C) (Oral)  Resp 20  Ht 5\' 4"  (1.626 m)  Wt 155 lb (70.308 kg)  BMI 26.61 kg/m2  SpO2 98%  Physical Exam CONSTITUTIONAL: Well developed/well nourished HEAD AND FACE: Normocephalic/atraumatic EYES: EOMI/PERRL ENMT: Mucous membranes moist NECK: supple no meningeal signs SPINE:entire spine nontender CV: S1/S2 noted, no murmurs/rubs/gallops noted LUNGS: Lungs are clear to auscultation bilaterally, no apparent distress ABDOMEN: soft, nontender, no rebound or guarding GU:no cva tenderness NEURO: Pt is awake/alert, moves all extremitiesx4 EXTREMITIES: pulses normal, full ROM SKIN: warm, color normal PSYCH: no abnormalities of mood noted  ED Course  Procedures   DIAGNOSTIC STUDIES: Oxygen Saturation is  98% on room air, normal by my interpretation.    COORDINATION OF CARE:   Pt well appearing sitting up in chair, no distress, no vomiting, abd exam benign Stable for d/c I doubt an acute abd/gyn process at this time Discussed strict return precautions  The patient appears reasonably screened and/or stabilized for discharge and I doubt any other medical condition or other [facility] requiring further screening, evaluation, or treatment in the ED at this time prior to discharge.    Labs Reviewed  URINALYSIS, ROUTINE W REFLEX MICROSCOPIC     MDM  Nursing notes reviewed and considered in documentation All labs/vitals reviewed and considered Previous records reviewed and considered    I personally performed the services described in this documentation, which was scribed in my presence. The recorded information has been reviewed and considered.           Joya Gaskins, MD 08/23/11 1125

## 2011-08-23 NOTE — ED Notes (Signed)
Pt states that she started having lower abdominal pain when she urinates, pt she also has started to have back pain for about 3 weeks starting from her neck to her buttocks.

## 2011-08-25 ENCOUNTER — Emergency Department (HOSPITAL_COMMUNITY)
Admission: EM | Admit: 2011-08-25 | Discharge: 2011-08-25 | Disposition: A | Discharge status: Home / Self Care | Payer: 59 | Attending: Emergency Medicine | Admitting: Emergency Medicine

## 2011-08-25 ENCOUNTER — Encounter (HOSPITAL_COMMUNITY): Payer: Self-pay | Admitting: *Deleted

## 2011-08-25 DIAGNOSIS — R5381 Other malaise: Secondary | ICD-10-CM | POA: Insufficient documentation

## 2011-08-25 DIAGNOSIS — IMO0001 Reserved for inherently not codable concepts without codable children: Secondary | ICD-10-CM | POA: Insufficient documentation

## 2011-08-25 DIAGNOSIS — R51 Headache: Secondary | ICD-10-CM | POA: Insufficient documentation

## 2011-08-25 DIAGNOSIS — E785 Hyperlipidemia, unspecified: Secondary | ICD-10-CM | POA: Insufficient documentation

## 2011-08-25 DIAGNOSIS — E860 Dehydration: Secondary | ICD-10-CM

## 2011-08-25 DIAGNOSIS — M549 Dorsalgia, unspecified: Secondary | ICD-10-CM | POA: Insufficient documentation

## 2011-08-25 DIAGNOSIS — K219 Gastro-esophageal reflux disease without esophagitis: Secondary | ICD-10-CM | POA: Insufficient documentation

## 2011-08-25 DIAGNOSIS — R197 Diarrhea, unspecified: Secondary | ICD-10-CM | POA: Insufficient documentation

## 2011-08-25 DIAGNOSIS — J45909 Unspecified asthma, uncomplicated: Secondary | ICD-10-CM | POA: Insufficient documentation

## 2011-08-25 DIAGNOSIS — R109 Unspecified abdominal pain: Secondary | ICD-10-CM | POA: Insufficient documentation

## 2011-08-25 LAB — CBC
HCT: 36 % (ref 36.0–46.0)
Hemoglobin: 12.3 g/dL (ref 12.0–15.0)
MCV: 90.5 fL (ref 78.0–100.0)
RBC: 3.98 MIL/uL (ref 3.87–5.11)
RDW: 12.1 % (ref 11.5–15.5)
WBC: 4.4 10*3/uL (ref 4.0–10.5)

## 2011-08-25 LAB — COMPREHENSIVE METABOLIC PANEL
AST: 17 U/L (ref 0–37)
Albumin: 3.7 g/dL (ref 3.5–5.2)
Alkaline Phosphatase: 62 U/L (ref 39–117)
BUN: 10 mg/dL (ref 6–23)
CO2: 28 mEq/L (ref 19–32)
Chloride: 98 mEq/L (ref 96–112)
Creatinine, Ser: 0.76 mg/dL (ref 0.50–1.10)
GFR calc non Af Amer: >90 mL/min (ref >90)
Potassium: 3.5 mEq/L (ref 3.5–5.1)
Total Bilirubin: 0.4 mg/dL (ref 0.3–1.2)

## 2011-08-25 LAB — URINALYSIS, ROUTINE W REFLEX MICROSCOPIC
Bilirubin Urine: NEGATIVE
Glucose, UA: NEGATIVE mg/dL
Ketones, ur: 15 mg/dL — AB
Protein, ur: NEGATIVE mg/dL
pH: 5.5 (ref 5.0–8.0)

## 2011-08-25 LAB — URINE MICROSCOPIC-ADD ON

## 2011-08-25 LAB — LIPASE, BLOOD: Lipase: 46 U/L (ref 11–59)

## 2011-08-25 MED ORDER — SODIUM CHLORIDE 0.9 % IV BOLUS (SEPSIS)
1000.0000 mL | Freq: Once | INTRAVENOUS | Status: AC
  Administered 2011-08-25: 1000 mL via INTRAVENOUS

## 2011-08-25 MED ORDER — SODIUM CHLORIDE 0.9 % IV BOLUS (SEPSIS): 1000.0000 mL | Freq: Once | INTRAVENOUS | Status: DC

## 2011-08-25 NOTE — ED Provider Notes (Signed)
History   This chart was scribed for Yvette Chick, MD by Brooks Sailors. The patient was seen in room APA16A/APA16A. Patient's care was started at 1233.   CSN: [id number]  Arrival date & time 08/25/11  1233   First MD Initiated Contact with Patient 08/25/11 1319      Chief Complaint  Patient presents with  . Diarrhea  . Fatigue    (Consider location/radiation/quality/duration/timing/severity/associated sxs/prior treatment) HPI LAURENCE FOLZ is a 41 y.o. female who presents to the Emergency Department complaining of constant moderate to severe diarrhea onset three days ago with associated abdominal pain described as cramping, fatigue, myalgia, and HA. Patient has been drinking lots of water and V8 juice. Patient experienced 2 episodes of diarrhea yesterday and four episodes today. Denies blood in stool, fever, vomiting. Patient was seen two days ago in ED for different symptoms- back pain. Denies history of diabetes and high blood pressure. There are no other associated systemic symptoms.  There are no alleviating or modifying factors.  Has not tried any meds at home.     Past Medical History  Diagnosis Date  . Anemia     iron 1 yr ago, normal hgb 7/12  . Asthma   . Sinus drainage   . Anxiety   . Hyperlipidemia   . Shortness of breath   . GERD (gastroesophageal reflux disease)   . Ovarian cyst, right   . Fibromyalgia     Past Surgical History  Procedure Date  . Partial hysterectomy 93  . Cesarean section     x2  . Ovarian cyst removal 92 removal of cysts from behind ovaries  . Cholecystectomy 2005    cholelithiasis  . Tonsillectomy     2010  . Septoplasty     2011  . Esophagogastroduodenoscopy 01/23/2011    Procedure: ESOPHAGOGASTRODUODENOSCOPY (EGD);  Surgeon: Corbin Ade, MD;  [facility];  Service: Endoscopy;  Laterality: N/A;  9:00am  . Maloney dilation 01/23/2011    Procedure: Elease Hashimoto DILATION;  Surgeon: Corbin Ade, MD;  Location: [facility];  Service: Endoscopy;  Laterality: N/A;  . Upper gastrointestinal endoscopy     Family History  Problem Relation Age of Onset  . Diabetes Father   . Hypertension Mother   . Anesthesia problems Neg Hx   . Malignant hyperthermia Neg Hx   . Hypotension Neg Hx   . Pseudochol deficiency Neg Hx     History  Substance Use Topics  . Smoking status: Never Smoker   . Smokeless tobacco: Not on file  . Alcohol Use: No    OB History    Grav Para Term Preterm Abortions TAB SAB Ect Mult Living                  Review of Systems 10 Systems reviewed and are negative for acute change except as noted in the HPI.  Allergies  Lortab and Percocet  Home Medications   Current Outpatient Rx  Name Route Sig Dispense Refill  . DEXLANSOPRAZOLE 30 MG PO CPDR Oral Take 30 mg by mouth daily as needed. For indigestion     . IBUPROFEN 200 MG PO TABS Oral Take 400 mg by mouth as needed. For fever/pain      BP 98/63  Pulse 76  Temp(Src) 97.5 F (36.4 C) (Oral)  Resp 18  Ht 5\' 4"  (1.626 m)  Wt 155 lb (70.308 kg)  BMI 26.61 kg/m2  SpO2 100%  Physical Exam  Nursing  note and vitals reviewed. Constitutional: She is oriented to person, place, and time. She appears well-developed and well-nourished. No distress.  HENT:  Head: Normocephalic and atraumatic.  Mouth/Throat: Oropharynx is clear and moist.  Eyes: EOM are normal. Pupils are equal, round, and reactive to light.  Neck: Neck supple. No tracheal deviation present.  Cardiovascular: Normal rate and regular rhythm.  Exam reveals no gallop and no friction rub.   No murmur heard. Pulmonary/Chest: Effort normal. No respiratory distress. She has no wheezes. She has no rales.  Abdominal: Soft. Bowel sounds are normal. She exhibits no distension. There is no tenderness. There is no CVA tenderness.  Musculoskeletal: Normal range of motion. She exhibits no edema.       mild paraspinal tenderness.  Neurological: She is alert and oriented to  person, place, and time. No sensory deficit.  Skin: Skin is warm and dry.  Psychiatric: She has a normal mood and affect. Her behavior is normal.    ED Course  Procedures (including critical care time) DIAGNOSTIC STUDIES: Oxygen Saturation is 100% on room air, normal by my interpretation.    COORDINATION OF CARE: 1:43PM Patient informed of current plan for treatment and evaluation and agrees with plan at this time.     Labs Reviewed  COMPREHENSIVE METABOLIC PANEL - Abnormal; Notable for the following:    Sodium 133 (*)    Glucose, Bld 101 (*)    All other components within normal limits  URINALYSIS, ROUTINE W REFLEX MICROSCOPIC - Abnormal; Notable for the following:    Hgb urine dipstick TRACE (*)    Ketones, ur 15 (*)    All other components within normal limits  URINE MICROSCOPIC-ADD ON - Abnormal; Notable for the following:    Squamous Epithelial / LPF FEW (*)    All other components within normal limits  CBC  LIPASE, BLOOD  POCT PREGNANCY, URINE   No results found.   1. Diarrhea   2. Dehydration       MDM  Pt is tired but nontoxic appearing.  Describes multiple episdoes of loose stool with assoicated generalized weakness and fatigue.  Labs reassuring with the exception of mild hyponatremia- she has received 2 liters IV hydration in ED.  Urine with small ketones.  Pt is not vomiting and drinking liquids well.  Benign abdominal exam.  Discharged with strict return precautions. Pt agreeable with plan.    I personally performed the services described in this documentation, which was scribed in my presence. The recorded information has been reviewed and considered.       Yvette Chick, MD 08/25/11 1524

## 2011-08-25 NOTE — ED Notes (Signed)
Pt presents with diarrhea, fatigue, headache x 3 days. Pt denies vomiting and fever

## 2011-08-25 NOTE — ED Notes (Signed)
Patient did not complain of anything during orthostatic vital signs until she stood. Patient states "I feel a little woozy" while she was standing.

## 2011-08-25 NOTE — Discharge Instructions (Signed)
Return to the ED with any concerns including abdominal pain, fever, fainting, vomiting and not able to keep down liquids, decreased level of alertness or lethargy, or any other alarming symptoms.

## 2011-08-25 NOTE — ED Notes (Signed)
Pt resting in room, denies needs at present. IV bolus infusing without difficulty.

## 2011-09-02 ENCOUNTER — Ambulatory Visit (INDEPENDENT_AMBULATORY_CARE_PROVIDER_SITE_OTHER): Payer: 59 | Admitting: Family Medicine

## 2011-09-02 ENCOUNTER — Encounter: Payer: Self-pay | Admitting: Family Medicine

## 2011-09-02 VITALS — BP 108/72 | HR 71 | Resp 16 | Ht 63.5 in | Wt 164.1 lb

## 2011-09-02 DIAGNOSIS — R1310 Dysphagia, unspecified: Secondary | ICD-10-CM

## 2011-09-02 DIAGNOSIS — E559 Vitamin D deficiency, unspecified: Secondary | ICD-10-CM | POA: Insufficient documentation

## 2011-09-02 DIAGNOSIS — R0981 Nasal congestion: Secondary | ICD-10-CM

## 2011-09-02 DIAGNOSIS — F411 Generalized anxiety disorder: Secondary | ICD-10-CM

## 2011-09-02 DIAGNOSIS — E785 Hyperlipidemia, unspecified: Secondary | ICD-10-CM

## 2011-09-02 DIAGNOSIS — F419 Anxiety disorder, unspecified: Secondary | ICD-10-CM

## 2011-09-02 DIAGNOSIS — M797 Fibromyalgia: Secondary | ICD-10-CM

## 2011-09-02 DIAGNOSIS — J3489 Other specified disorders of nose and nasal sinuses: Secondary | ICD-10-CM

## 2011-09-02 DIAGNOSIS — IMO0001 Reserved for inherently not codable concepts without codable children: Secondary | ICD-10-CM

## 2011-09-02 MED ORDER — DEXLANSOPRAZOLE 30 MG PO CPDR: 30.0000 mg | DELAYED_RELEASE_CAPSULE | Freq: Every day | ORAL | Status: DC | PRN

## 2011-09-02 NOTE — Patient Instructions (Signed)
Please set up for Mammogram F/U for Complete physical with PAP Smear within the next 6 weeks Get the labs done fasting do not eat after midnight Continue your acid reflux medication

## 2011-09-03 ENCOUNTER — Encounter: Payer: Self-pay | Admitting: Family Medicine

## 2011-09-03 DIAGNOSIS — F419 Anxiety disorder, unspecified: Secondary | ICD-10-CM | POA: Insufficient documentation

## 2011-09-03 NOTE — Assessment & Plan Note (Signed)
She has many pains, more consistent with fibromyalgia as she has had a lot of work-up per report . No meds currently. I will obtain records

## 2011-09-03 NOTE — Assessment & Plan Note (Signed)
She appears to have a lot of anxiety regarding her multiple somatic complaints, no current treatments, I would like to see her previous PCP notes regarding this

## 2011-09-03 NOTE — Assessment & Plan Note (Signed)
Currently on PPI, has been seen by GI in the past

## 2011-09-03 NOTE — Assessment & Plan Note (Signed)
Check FLP 

## 2011-09-03 NOTE — Assessment & Plan Note (Signed)
She would like to see allergist, I will obtain records first

## 2011-09-03 NOTE — Progress Notes (Signed)
  Subjective:    [name], female    DOB: 1970-06-23, 41 y.o.   MRN: [id number]  HPI Pt here to establish care previous PCP Dr. Barbarann Ehlers, in [place] Medications and history reviewed 1. Hyperlipidemia- currently diet controlled, was on cholesterol medication in the past 2. Vit D def- no current supplements 3. chronic difficulty breathing out of nose. 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 GERD- s/p EGD now on PPI 6.Fibromyalgia- has pain all over and in many joints, has restless left leg at night, she states she is sore and hurts most of the time 7. Asthma- questionable diagnosis, given advair in the past 8. Insomnia- she has had swing shifts for the past few months, and has not been able to sleep well 9. Scalp cysts for many years, hs had some lanced in the past Last CPE- 2012, Mammogram 2012  Review of Systems   GEN- + fatigue, fever, weight loss,weakness, recent illness HEENT- denies eye drainage, change in vision, nasal discharge, CVS- denies chest pain, palpitations RESP- denies SOB, cough, wheeze ABD- denies N/V, change in stools, abd pain GU- denies dysuria, hematuria, dribbling, incontinence MSK- + joint pain, +muscle aches, injury Neuro- denies headache, dizziness, syncope, seizure activity, +tingling episodes      Objective:   Physical Exam GEN- NAD, alert and oriented x3 HEENT- PERRL, EOMI, non injected sclera, pink conjunctiva, MMM, oropharynx clear Neck- Supple, no thyromegaly CVS- RRR, no murmur RESP-CTAB ABD-NABS,soft, NT,ND EXT- No edema Pulses- Radial, DP- 2+ Skin- few cystic lesions palpated on scalp       Assessment & Plan:

## 2011-09-10 LAB — LIPID PANEL
Cholesterol: 176 mg/dL (ref 0–200)
HDL: 57 mg/dL (ref >39)
Triglycerides: 69 mg/dL (ref <150)

## 2011-09-11 ENCOUNTER — Encounter: Payer: 59 | Admitting: Family Medicine

## 2011-09-12 LAB — VITAMIN D 1,25 DIHYDROXY: Vitamin D2 1, 25 (OH)2: <8 pg/mL

## 2011-09-16 ENCOUNTER — Encounter: Payer: 59 | Admitting: Family Medicine

## 2011-09-16 ENCOUNTER — Encounter: Payer: Self-pay | Admitting: Family Medicine

## 2011-09-18 ENCOUNTER — Ambulatory Visit (INDEPENDENT_AMBULATORY_CARE_PROVIDER_SITE_OTHER): Payer: 59 | Admitting: Family Medicine

## 2011-09-18 ENCOUNTER — Other Ambulatory Visit (HOSPITAL_COMMUNITY)
Admission: RE | Admit: 2011-09-18 | Discharge: 2011-09-18 | Disposition: A | Discharge status: Home / Self Care | Payer: 59 | Source: Ambulatory Visit | Attending: Family Medicine | Admitting: Family Medicine

## 2011-09-18 ENCOUNTER — Other Ambulatory Visit: Payer: Self-pay | Admitting: Family Medicine

## 2011-09-18 ENCOUNTER — Encounter: Payer: Self-pay | Admitting: Family Medicine

## 2011-09-18 ENCOUNTER — Telehealth: Payer: Self-pay | Admitting: Family Medicine

## 2011-09-18 VITALS — BP 112/80 | HR 74 | Resp 16 | Ht 63.5 in | Wt 158.8 lb

## 2011-09-18 DIAGNOSIS — Z01419 Encounter for gynecological examination (general) (routine) without abnormal findings: Secondary | ICD-10-CM | POA: Insufficient documentation

## 2011-09-18 DIAGNOSIS — Z136 Encounter for screening for cardiovascular disorders: Secondary | ICD-10-CM

## 2011-09-18 DIAGNOSIS — N760 Acute vaginitis: Secondary | ICD-10-CM

## 2011-09-18 DIAGNOSIS — Z23 Encounter for immunization: Secondary | ICD-10-CM

## 2011-09-18 DIAGNOSIS — Z139 Encounter for screening, unspecified: Secondary | ICD-10-CM

## 2011-09-18 DIAGNOSIS — K219 Gastro-esophageal reflux disease without esophagitis: Secondary | ICD-10-CM

## 2011-09-18 DIAGNOSIS — J3489 Other specified disorders of nose and nasal sinuses: Secondary | ICD-10-CM

## 2011-09-18 DIAGNOSIS — E559 Vitamin D deficiency, unspecified: Secondary | ICD-10-CM

## 2011-09-18 DIAGNOSIS — E785 Hyperlipidemia, unspecified: Secondary | ICD-10-CM

## 2011-09-18 DIAGNOSIS — R0981 Nasal congestion: Secondary | ICD-10-CM

## 2011-09-18 DIAGNOSIS — Z1211 Encounter for screening for malignant neoplasm of colon: Secondary | ICD-10-CM

## 2011-09-18 LAB — POC HEMOCCULT BLD/STL (OFFICE/1-CARD/DIAGNOSTIC): Fecal Occult Blood, POC: NEGATIVE

## 2011-09-18 NOTE — Assessment & Plan Note (Signed)
PAP Smear done on vaginal cuff, if negative she does not require any further PAP Cultures taken at request Mammogram TDAP

## 2011-09-18 NOTE — Progress Notes (Signed)
  Subjective:    [name], female    DOB: 1970-06-25, 41 y.o.   MRN: [id number]  HPI   Pt here for GYN exam- Mammogram to be scheduled, s/p hysterectomy for fibroid tumor   TDAP due    GERD- acid reflux meds not helping, caused diarrhea- was actually on Dexliant 60mg , she found a bottle at home    Sinus pressure and ear pressure- continues to have pressure in her ears and hears an echo especially at night time as well as a swishing sound.  She thinks her sinuses are clogged up and they need to be drained. She has pressure all over her head and her vision changes and gets blurry. (Seen by optho already) She is scheduled appointment with ear nose and throat. In the past she has tried multiple medications to help with decongestion from over-the-counter Sudafed, Mucinex to allergy medications Flonase, Afrin,zyrtec, loratadine, Allegra etc.   Review of Systems - per above     GEN- + fatigue, fever, weight loss,weakness, recent illness HEENT- denies eye drainage, change in vision, nasal discharge, CVS- denies chest pain, palpitations RESP- denies SOB, cough, wheeze ABD- denies N/V, change in stools, abd pain GU- denies dysuria, hematuria, dribbling, incontinence MSK- + joint pain, +muscle aches, injury Neuro- denies headache, dizziness, syncope, seizure activity, +tingling episodes     Objective:   Physical Exam GEN- NAD, alert and oriented x 3  Neck- supple, no thyromegaly HEENT-PERRL, EOMI, Fundoscopic exam benign, oropharynx clear, clear fluid seen behind Right TM, LEFT clear, no erythema or retraction noted bilat CVS-RRR, no murmur RESP-CTAB Breast- normal symmetry, no nipple inversion,no nipple drainage, no nodules or lumps felt Nodes- no axillary nodes GU- normal external genitalia, vaginal mucosa pink and moist, no cervix visualized or felt  white discharge,  no ovarian masses,  Ext- No edema Rectal- normal tone, soft brown stool, FOBT neg      Assessment &  Plan:

## 2011-09-18 NOTE — Patient Instructions (Addendum)
Continue to work on your diet and exercise Cholesterol looks good TDAP given Please scheduled Mammogram for end of May Results of todays testing will be mailed Take the scan to Dr. Andrey Campanile Try the prilosec 7 days first if this helps I will send a prescription F/U 6 months

## 2011-09-18 NOTE — Assessment & Plan Note (Signed)
She is to try prilosec as this has helped in the past, if this works she will call for prescription

## 2011-09-18 NOTE — Assessment & Plan Note (Signed)
resolved 

## 2011-09-18 NOTE — Assessment & Plan Note (Signed)
I will have her followup with ear nose and throat. She is to obtain her CT scan that she had done last year of an axillary sinus region and take to the appointment. I would not start any medications on her today she's been tried on many.

## 2011-09-21 NOTE — Telephone Encounter (Signed)
Message left that it was okay to take with med

## 2011-09-22 LAB — WET PREP BY MOLECULAR PROBE
Candida species: NEGATIVE
Gardnerella vaginalis: POSITIVE — AB
Trichomonas vaginosis: NEGATIVE

## 2011-09-22 MED ORDER — METRONIDAZOLE 500 MG PO TABS: 500.0000 mg | ORAL_TABLET | Freq: Two times a day (BID) | ORAL | Status: AC

## 2011-10-26 ENCOUNTER — Ambulatory Visit (HOSPITAL_COMMUNITY): Payer: 59

## 2011-10-29 ENCOUNTER — Ambulatory Visit (HOSPITAL_COMMUNITY): Payer: 59

## 2011-11-02 ENCOUNTER — Ambulatory Visit (HOSPITAL_COMMUNITY)
Admission: RE | Admit: 2011-11-02 | Discharge: 2011-11-02 | Disposition: A | Discharge status: Home / Self Care | Payer: 59 | Source: Ambulatory Visit | Attending: Family Medicine | Admitting: Family Medicine

## 2011-11-02 DIAGNOSIS — Z1231 Encounter for screening mammogram for malignant neoplasm of breast: Secondary | ICD-10-CM | POA: Insufficient documentation

## 2011-11-02 DIAGNOSIS — Z139 Encounter for screening, unspecified: Secondary | ICD-10-CM

## 2011-11-06 ENCOUNTER — Telehealth: Payer: Self-pay | Admitting: Family Medicine

## 2011-11-06 NOTE — Telephone Encounter (Signed)
I spoke with pt, given abnl Mammogram results Will await radiology to set up repeat scan of right breast, if she does not hear by next week she will call me

## 2011-11-16 ENCOUNTER — Telehealth: Payer: Self-pay | Admitting: Family Medicine

## 2011-11-16 NOTE — Telephone Encounter (Signed)
Pt informed mammogram normal after comparison. She is having some breast tenderness, no redness or swelling she gets this every month even though she is s/p hysterectomy.Advised advil and warm compresses

## 2011-11-26 ENCOUNTER — Ambulatory Visit (HOSPITAL_COMMUNITY)
Admission: RE | Admit: 2011-11-26 | Discharge: 2011-11-26 | Disposition: A | Discharge status: Home / Self Care | Payer: 59 | Source: Ambulatory Visit | Attending: Family Medicine | Admitting: Family Medicine

## 2011-11-26 ENCOUNTER — Ambulatory Visit (INDEPENDENT_AMBULATORY_CARE_PROVIDER_SITE_OTHER): Payer: 59 | Admitting: Family Medicine

## 2011-11-26 ENCOUNTER — Encounter: Payer: Self-pay | Admitting: Family Medicine

## 2011-11-26 VITALS — BP 100/70 | HR 58 | Resp 16 | Ht 63.5 in | Wt 158.1 lb

## 2011-11-26 DIAGNOSIS — M791 Myalgia, unspecified site: Secondary | ICD-10-CM

## 2011-11-26 DIAGNOSIS — M549 Dorsalgia, unspecified: Secondary | ICD-10-CM

## 2011-11-26 DIAGNOSIS — IMO0001 Reserved for inherently not codable concepts without codable children: Secondary | ICD-10-CM

## 2011-11-26 DIAGNOSIS — M545 Low back pain, unspecified: Secondary | ICD-10-CM | POA: Insufficient documentation

## 2011-11-26 DIAGNOSIS — M542 Cervicalgia: Secondary | ICD-10-CM

## 2011-11-26 DIAGNOSIS — R300 Dysuria: Secondary | ICD-10-CM

## 2011-11-26 DIAGNOSIS — F419 Anxiety disorder, unspecified: Secondary | ICD-10-CM

## 2011-11-26 DIAGNOSIS — H539 Unspecified visual disturbance: Secondary | ICD-10-CM

## 2011-11-26 DIAGNOSIS — M797 Fibromyalgia: Secondary | ICD-10-CM

## 2011-11-26 DIAGNOSIS — R7989 Other specified abnormal findings of blood chemistry: Secondary | ICD-10-CM

## 2011-11-26 DIAGNOSIS — R946 Abnormal results of thyroid function studies: Secondary | ICD-10-CM

## 2011-11-26 DIAGNOSIS — F411 Generalized anxiety disorder: Secondary | ICD-10-CM

## 2011-11-26 LAB — CBC
MCV: 89.4 fL (ref 78.0–100.0)
Platelets: 346 10*3/uL (ref 150–400)
RDW: 13.2 % (ref 11.5–15.5)
WBC: 3.8 10*3/uL — ABNORMAL LOW (ref 4.0–10.5)

## 2011-11-26 LAB — BASIC METABOLIC PANEL
BUN: 11 mg/dL (ref 6–23)
Calcium: 9.1 mg/dL (ref 8.4–10.5)
Creat: 0.78 mg/dL (ref 0.50–1.10)
Glucose, Bld: 82 mg/dL (ref 70–99)

## 2011-11-26 LAB — POCT URINALYSIS DIPSTICK
Bilirubin, UA: NEGATIVE
Leukocytes, UA: NEGATIVE
Nitrite, UA: NEGATIVE
Protein, UA: NEGATIVE
Urobilinogen, UA: 0.2
pH, UA: 5.5

## 2011-11-26 LAB — SEDIMENTATION RATE: Sed Rate: 15 mm/hr (ref 0–22)

## 2011-11-26 LAB — C-REACTIVE PROTEIN: CRP: 0.13 mg/dL (ref <0.60)

## 2011-11-26 NOTE — Patient Instructions (Addendum)
Get the bloodwork done Get the x-rays done We will call with results  Continue ibuprofen as needed Stay hydrated and rest  F/U 1 week for results

## 2011-11-26 NOTE — Progress Notes (Signed)
  Subjective:    [name], female    DOB: Mar 06, 1971, 41 y.o.   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 These symptoms have been present for 6 months or more. She states she passed out at work Tuesday, denies CP, SOB.    Review of Systems   GEN- denies fatigue, fever, weight loss,weakness, recent illness HEENT- denies eye drainage, +change in vision, nasal discharge, CVS- denies chest pain, +palpitations RESP- denies SOB, cough, wheeze ABD- denies N/V, change in stools, abd pain GU- + dysuria, denies hematuria, dribbling, incontinence MSK- + joint pain, +muscle aches, injury Neuro- denies headache, dizziness, +syncope, seizure activity      Objective:   Physical Exam GEN- NAD, alert and oriented x3 HEENT- PERRL, EOMI, non injected sclera, pink conjunctiva, MMM, oropharynx clear, fundus normal Neck- Supple, no thyromegaly, TTP c spine CVS- RRR, no murmur RESP-CTAB abd-Nabs, SOFT, nt,nd  EXT- No edema BACK- spine TTP, neg SLR, TTP lumbar region and thoracic region, no spasm seen Neuro- CNII-XII in tact, motor equal bilat, sensation in tact, DTR symmetric bilat,  MSK- TTP over back lower ext, no joint swelling noted Pulses- Radial, DP- 2+  EKG- NSR, no ST changes         Assessment & Plan:  REcheck TSH had abnormal in 2012

## 2011-11-29 DIAGNOSIS — M542 Cervicalgia: Secondary | ICD-10-CM | POA: Insufficient documentation

## 2011-11-29 NOTE — Assessment & Plan Note (Signed)
I have not received her records, will review labs, will likley need to treat fibromyalgia, will try Savella or cymbalta to see if if we can help some of the anxiety as well

## 2011-11-29 NOTE — Assessment & Plan Note (Signed)
I think her symptoms are related to her fibromyalgia, however with the worsening pain and joint pain, will obtain labs, including ANA, ESR, CRP

## 2011-11-29 NOTE — Assessment & Plan Note (Signed)
Plain film to be done

## 2011-11-29 NOTE — Assessment & Plan Note (Signed)
c spine to be done

## 2011-11-30 LAB — ANA: Anti Nuclear Antibody(ANA): POSITIVE — AB

## 2011-12-01 ENCOUNTER — Encounter: Payer: Self-pay | Admitting: Family Medicine

## 2011-12-01 ENCOUNTER — Ambulatory Visit (INDEPENDENT_AMBULATORY_CARE_PROVIDER_SITE_OTHER): Payer: 59 | Admitting: Family Medicine

## 2011-12-01 VITALS — BP 106/64 | HR 70 | Resp 18 | Ht 63.5 in | Wt 160.1 lb

## 2011-12-01 DIAGNOSIS — R768 Other specified abnormal immunological findings in serum: Secondary | ICD-10-CM

## 2011-12-01 DIAGNOSIS — M791 Myalgia, unspecified site: Secondary | ICD-10-CM

## 2011-12-01 DIAGNOSIS — R894 Abnormal immunological findings in specimens from other organs, systems and tissues: Secondary | ICD-10-CM

## 2011-12-01 DIAGNOSIS — M542 Cervicalgia: Secondary | ICD-10-CM

## 2011-12-01 DIAGNOSIS — IMO0001 Reserved for inherently not codable concepts without codable children: Secondary | ICD-10-CM

## 2011-12-01 MED ORDER — CYCLOBENZAPRINE HCL 10 MG PO TABS: 10.0000 mg | ORAL_TABLET | Freq: Every evening | ORAL | Status: DC | PRN

## 2011-12-01 NOTE — Assessment & Plan Note (Signed)
Normal x-ray results. He did note spasm based on the flexion patterns. We'll start her on Flexeril at bedtime to help with the muscle spasm and see if this will help her rest

## 2011-12-01 NOTE — Progress Notes (Signed)
  Subjective:    [name], female    DOB: [birth year], 41 y.o.   MRN: [id number]  HPI  Patient here to followup lab results and x-ray results. She continues to reiterate the pain that she feels all over her body including her joints. She feels that her joints are stiff. X-rays reviewed with patient labs reviewed show positive ANA with a titer 1: 640 which is quite significant. Her CRP and ESR however were not elevated. She's not sleeping because of aches and pains and she has tightness in her neck.  Review of Systems - per above        Objective:   Physical Exam GEN-NAD, alert and oriented x 3  Hands- no joint swelling, able to make normal size fist  Skin- no malar rash or other rash      Assessment & Plan:

## 2011-12-01 NOTE — Assessment & Plan Note (Signed)
Myalgias and joint pain I initially felt this was just going to be fibromyalgia however she has significantly positive ANA with titer. All refer her to rheumatology for further workup and any recommendations. We will hold on medications besides the muscle relaxant per above.

## 2011-12-01 NOTE — Patient Instructions (Addendum)
I will refer you to the rheumatologist for your muscle aches Try the flexeril at bedtime for muscle relaxations and sleep  F/U 3 months

## 2011-12-02 ENCOUNTER — Ambulatory Visit: Payer: 59 | Admitting: Family Medicine

## 2011-12-31 ENCOUNTER — Encounter: Payer: Self-pay | Admitting: Family Medicine

## 2012-01-05 ENCOUNTER — Encounter: Payer: 59 | Admitting: Family Medicine

## 2012-02-03 ENCOUNTER — Ambulatory Visit (HOSPITAL_COMMUNITY): Admission: RE | Admit: 2012-02-03 | Payer: 59 | Source: Ambulatory Visit

## 2012-02-16 ENCOUNTER — Ambulatory Visit: Payer: 59 | Admitting: Family Medicine

## 2012-03-03 ENCOUNTER — Ambulatory Visit: Payer: 59 | Admitting: Family Medicine

## 2012-03-28 ENCOUNTER — Ambulatory Visit: Payer: 59 | Admitting: Family Medicine

## 2012-04-19 ENCOUNTER — Encounter: Payer: Self-pay | Admitting: Family Medicine

## 2012-04-19 ENCOUNTER — Ambulatory Visit (INDEPENDENT_AMBULATORY_CARE_PROVIDER_SITE_OTHER): Payer: 59 | Admitting: Family Medicine

## 2012-04-19 ENCOUNTER — Other Ambulatory Visit: Payer: Self-pay | Admitting: Family Medicine

## 2012-04-19 ENCOUNTER — Ambulatory Visit (HOSPITAL_COMMUNITY)
Admission: RE | Admit: 2012-04-19 | Discharge: 2012-04-19 | Disposition: A | Discharge status: Home / Self Care | Payer: 59 | Source: Ambulatory Visit | Attending: Family Medicine | Admitting: Family Medicine

## 2012-04-19 VITALS — BP 98/60 | HR 68 | Resp 18 | Ht 63.5 in | Wt 159.1 lb

## 2012-04-19 DIAGNOSIS — M79609 Pain in unspecified limb: Secondary | ICD-10-CM

## 2012-04-19 DIAGNOSIS — M79662 Pain in left lower leg: Secondary | ICD-10-CM

## 2012-04-19 DIAGNOSIS — IMO0001 Reserved for inherently not codable concepts without codable children: Secondary | ICD-10-CM

## 2012-04-19 DIAGNOSIS — J3489 Other specified disorders of nose and nasal sinuses: Secondary | ICD-10-CM

## 2012-04-19 DIAGNOSIS — M797 Fibromyalgia: Secondary | ICD-10-CM

## 2012-04-19 DIAGNOSIS — M79669 Pain in unspecified lower leg: Secondary | ICD-10-CM

## 2012-04-19 DIAGNOSIS — R609 Edema, unspecified: Secondary | ICD-10-CM | POA: Insufficient documentation

## 2012-04-19 DIAGNOSIS — R0981 Nasal congestion: Secondary | ICD-10-CM

## 2012-04-19 MED ORDER — GABAPENTIN 300 MG PO CAPS: 300.0000 mg | ORAL_CAPSULE | Freq: Every day | ORAL | Status: DC

## 2012-04-19 NOTE — Progress Notes (Signed)
  Subjective:    [name], female    DOB: 15-Jul-1970, 41 y.o.   MRN: [id number]  HPI  Patient presents to followup myalgias. She was evaluated by rheumatology. Positive Sjrogen with a positive ANA. She also has a diagnosis of fibromyalgia. She was given a prescription for new visual however is unable to afford. She has started some exercises however did not start physical therapy is recommended. She also did not call the rheumatologist when she cannot afford the medication. She also has continued complaints about her left ear pressure and nasal congestion. She's been evaluated by ENT multiple times. She was last seen by [facility] after being referred by local ENT per Dr. Andrey Campanile. She was told that she may have a problem with her hearing however did not followup with this as well. She has had increased pain in left calf, was to have ultrasound done but was never told date of exam Review of Systems    GEN- + fatigue, fever, weight loss,weakness, recent illness HEENT- denies eye drainage, change in vision, nasal discharge, CVS- denies chest pain, palpitations RESP- denies SOB, cough, wheeze ABD- denies N/V, change in stools, abd pain GU- denies dysuria, hematuria, dribbling, incontinence MSK- + joint pain, +muscle aches, injury Neuro- denies headache, dizziness, syncope, seizure activity      Objective:   Physical Exam GEN- NAD, alert and oriented x3 HEENT- PERRL, EOMI, non injected sclera, pink conjunctiva, MMM, oropharynx clear, TM clear bilat, nares clear  Neck- Supple,  CVS- RRR, no murmur RESP-CTAB EXT- No edema Pulses- Radial, DP- 2+ MSK- + Homans Left side, left calf TTP, no swelling noted       Assessment & Plan:

## 2012-04-19 NOTE — Patient Instructions (Addendum)
Continue the exercise  Start gabapentin at bedtime for the fibromyalgia Follow up with Ear Nose and Throat  Call the rheumatologist about the medication Nuvigil ( not able to afford)  F/U 3 months

## 2012-04-20 DIAGNOSIS — M79669 Pain in unspecified lower leg: Secondary | ICD-10-CM | POA: Insufficient documentation

## 2012-04-20 NOTE — Assessment & Plan Note (Signed)
Ultrasound negative, no DVT, muscular pain

## 2012-04-20 NOTE — Assessment & Plan Note (Signed)
Start gabapentin at bedtime, she can f/u rheumatolgist regarding the nuvigil Add NSAID for muscle aches/back pain

## 2012-04-20 NOTE — Assessment & Plan Note (Signed)
Advised to continue allergy meds, f/u with ENT at [facility], she states they told her she has an ear problem

## 2012-05-19 ENCOUNTER — Ambulatory Visit (INDEPENDENT_AMBULATORY_CARE_PROVIDER_SITE_OTHER): Payer: 59 | Admitting: Otolaryngology

## 2012-06-01 ENCOUNTER — Emergency Department (HOSPITAL_COMMUNITY)
Admission: EM | Admit: 2012-06-01 | Discharge: 2012-06-01 | Disposition: A | Discharge status: Home / Self Care | Payer: 59 | Attending: Emergency Medicine | Admitting: Emergency Medicine

## 2012-06-01 ENCOUNTER — Encounter (HOSPITAL_COMMUNITY): Payer: Self-pay

## 2012-06-01 DIAGNOSIS — R079 Chest pain, unspecified: Secondary | ICD-10-CM | POA: Insufficient documentation

## 2012-06-01 DIAGNOSIS — Z8719 Personal history of other diseases of the digestive system: Secondary | ICD-10-CM | POA: Insufficient documentation

## 2012-06-01 DIAGNOSIS — IMO0001 Reserved for inherently not codable concepts without codable children: Secondary | ICD-10-CM | POA: Insufficient documentation

## 2012-06-01 DIAGNOSIS — Z8742 Personal history of other diseases of the female genital tract: Secondary | ICD-10-CM | POA: Insufficient documentation

## 2012-06-01 DIAGNOSIS — Z8659 Personal history of other mental and behavioral disorders: Secondary | ICD-10-CM | POA: Insufficient documentation

## 2012-06-01 DIAGNOSIS — R42 Dizziness and giddiness: Secondary | ICD-10-CM

## 2012-06-01 DIAGNOSIS — Z862 Personal history of diseases of the blood and blood-forming organs and certain disorders involving the immune mechanism: Secondary | ICD-10-CM | POA: Insufficient documentation

## 2012-06-01 DIAGNOSIS — Z87828 Personal history of other (healed) physical injury and trauma: Secondary | ICD-10-CM | POA: Insufficient documentation

## 2012-06-01 DIAGNOSIS — Z90710 Acquired absence of both cervix and uterus: Secondary | ICD-10-CM | POA: Insufficient documentation

## 2012-06-01 DIAGNOSIS — K0889 Other specified disorders of teeth and supporting structures: Secondary | ICD-10-CM

## 2012-06-01 DIAGNOSIS — Z9089 Acquired absence of other organs: Secondary | ICD-10-CM | POA: Insufficient documentation

## 2012-06-01 DIAGNOSIS — K029 Dental caries, unspecified: Secondary | ICD-10-CM | POA: Insufficient documentation

## 2012-06-01 DIAGNOSIS — Z8709 Personal history of other diseases of the respiratory system: Secondary | ICD-10-CM | POA: Insufficient documentation

## 2012-06-01 DIAGNOSIS — E785 Hyperlipidemia, unspecified: Secondary | ICD-10-CM | POA: Insufficient documentation

## 2012-06-01 DIAGNOSIS — J45909 Unspecified asthma, uncomplicated: Secondary | ICD-10-CM | POA: Insufficient documentation

## 2012-06-01 DIAGNOSIS — Z8739 Personal history of other diseases of the musculoskeletal system and connective tissue: Secondary | ICD-10-CM | POA: Insufficient documentation

## 2012-06-01 DIAGNOSIS — K089 Disorder of teeth and supporting structures, unspecified: Secondary | ICD-10-CM | POA: Insufficient documentation

## 2012-06-01 DIAGNOSIS — R52 Pain, unspecified: Secondary | ICD-10-CM | POA: Insufficient documentation

## 2012-06-01 MED ORDER — PENICILLIN V POTASSIUM 500 MG PO TABS: 500.0000 mg | ORAL_TABLET | Freq: Four times a day (QID) | ORAL | Status: AC

## 2012-06-01 MED ORDER — MECLIZINE HCL 12.5 MG PO TABS
25.0000 mg | ORAL_TABLET | Freq: Once | ORAL | Status: AC
  Administered 2012-06-01: 25 mg via ORAL
  Filled 2012-06-01: qty 2

## 2012-06-01 MED ORDER — MECLIZINE HCL 25 MG PO TABS: 25.0000 mg | ORAL_TABLET | Freq: Four times a day (QID) | ORAL | Status: DC

## 2012-06-01 MED ORDER — PENICILLIN V POTASSIUM 250 MG PO TABS
500.0000 mg | ORAL_TABLET | Freq: Once | ORAL | Status: AC
  Administered 2012-06-01: 500 mg via ORAL
  Filled 2012-06-01: qty 2

## 2012-06-01 NOTE — ED Provider Notes (Signed)
History     CSN: [id number]  Arrival date & time 06/01/12  4098   First MD Initiated Contact with Patient 06/01/12 [phone]      Chief Complaint  Patient presents with  . Dental Pain  . Dizziness  . Generalized Body Aches    (Consider location/radiation/quality/duration/timing/severity/associated sxs/prior treatment) HPI Comments: Has dental appt tomorrow at 0830.  States she thinks she has an abscess.  She has generalized aches and pains and occasionally feels"off balance".  No head trauma.  No CP or SOB  Patient is a 42 y.o. female presenting with tooth pain. The history is provided by the patient. No language interpreter was used.  Dental PainThe primary symptoms include mouth pain. Primary symptoms do not include dental injury or fever. Episode onset: 2 weeks ago. The symptoms are unchanged. The symptoms occur constantly.  Additional symptoms do not include: trismus, facial swelling and swollen glands.    Past Medical History  Diagnosis Date  . Anemia     iron 1 yr ago, normal hgb 7/12  . Asthma   . Sinus drainage   . Anxiety   . Hyperlipidemia   . Shortness of breath   . GERD (gastroesophageal reflux disease)   . Ovarian cyst, right   . Fibromyalgia   . Allergy   . TMJ (dislocation of temporomandibular joint)   . Chronic sinusitis     Past Surgical History  Procedure Date  . Partial hysterectomy 93  . Cesarean section     x2  . Ovarian cyst removal 92 removal of cysts from behind ovaries  . Cholecystectomy 2005    cholelithiasis  . Tonsillectomy     2010  . Septoplasty     2011  . Esophagogastroduodenoscopy 01/23/2011    Procedure: ESOPHAGOGASTRODUODENOSCOPY (EGD);  Surgeon: Corbin Ade, MD;  [facility];  Service: Endoscopy;  Laterality: N/A;  9:00am  . Maloney dilation 01/23/2011    Procedure: Elease Hashimoto DILATION;  Surgeon: Corbin Ade, MD;  Location: [facility];  Service: Endoscopy;  Laterality: N/A;  . Upper gastrointestinal endoscopy      Family History  Problem Relation Age of Onset  . Diabetes Father   . Hypertension Mother   . Anesthesia problems Neg Hx   . Malignant hyperthermia Neg Hx   . Hypotension Neg Hx   . Pseudochol deficiency Neg Hx     History  Substance Use Topics  . Smoking status: Never Smoker   . Smokeless tobacco: Not on file  . Alcohol Use: No    OB History    Grav Para Term Preterm Abortions TAB SAB Ect Mult Living                  Review of Systems  Constitutional: Negative for fever and chills.  HENT: Positive for dental problem. Negative for facial swelling.   Musculoskeletal: Positive for myalgias.  Neurological: Positive for dizziness.  All other systems reviewed and are negative.    Allergies  Hydrocodone-acetaminophen and Percocet  Home Medications   Current Outpatient Rx  Name  Route  Sig  Dispense  Refill  . IBUPROFEN 200 MG PO TABS   Oral   Take 400 mg by mouth as needed. For fever/pain         . LORATADINE 10 MG PO TABS   Oral   Take 10 mg by mouth daily.         Marland Kitchen OMEPRAZOLE 20 MG PO CPDR   Oral   Take  20 mg by mouth 2 (two) times daily.         Marland Kitchen MECLIZINE HCL 25 MG PO TABS   Oral   Take 1 tablet (25 mg total) by mouth 4 (four) times daily.   28 tablet   0   . PENICILLIN V POTASSIUM 500 MG PO TABS   Oral   Take 1 tablet (500 mg total) by mouth 4 (four) times daily.   40 tablet   0     BP 100/58  Pulse 66  Temp 97.5 F (36.4 C) (Oral)  Resp 20  Ht 5\' 5"  (1.651 m)  Wt 160 lb (72.576 kg)  BMI 26.63 kg/m2  SpO2 100%  Physical Exam  Nursing note and vitals reviewed. Constitutional: She is oriented to person, place, and time. She appears well-developed and well-nourished. No distress.  HENT:  Head: Normocephalic and atraumatic.  Right Ear: Hearing, tympanic membrane, external ear and ear canal normal.  Left Ear: Hearing, tympanic membrane, external ear and ear canal normal.  Mouth/Throat: Uvula is midline and oropharynx is clear  and moist. Dental caries present. No dental abscesses or uvula swelling.    Eyes: EOM are normal.  Neck: Normal range of motion.  Cardiovascular: Normal rate and regular rhythm.   Pulmonary/Chest: Effort normal.  Abdominal: Soft. She exhibits no distension. There is no tenderness.  Musculoskeletal: Normal range of motion.  Neurological: She is alert and oriented to person, place, and time.  Skin: Skin is warm and dry.  Psychiatric: She has a normal mood and affect. Judgment normal.    ED Course  Procedures (including critical care time)  Labs Reviewed - No data to display No results found.   Date: 06/01/2012  Rate: 56  Rhythm: sinus bradycardia  QRS Axis: normal  Intervals: normal  ST/T Wave abnormalities: normal  Conduction Disutrbances:none  Narrative Interpretation:   Old EKG Reviewed: unchanged from 02-13-11   1. Pain, dental   2. Vertigo       MDM  rx-antivert 25 mg , 28 rx-pen VK 500 mg , 40 F/u with dentist tomorrow AM        Evalina Field, PA 06/01/12 0954  Evalina Field, PA 06/07/12 2237

## 2012-06-01 NOTE — ED Notes (Signed)
Patient with no complaints at this time. Respirations even and unlabored. Skin warm/dry. Discharge instructions reviewed with patient at this time. Patient given opportunity to voice concerns/ask questions. Patient discharged at this time and left Emergency Department with steady gait.   

## 2012-06-01 NOTE — ED Notes (Signed)
Pt reports toothache that flared up over the weekend.  Also reports has felt dizzy, off balance, generalized body aches and sharp pain in chest since Friday.

## 2012-06-01 NOTE — ED Notes (Signed)
Rick Miller, PA at bedside. 

## 2012-06-02 ENCOUNTER — Emergency Department (HOSPITAL_COMMUNITY): Payer: 59

## 2012-06-02 ENCOUNTER — Encounter (HOSPITAL_COMMUNITY): Payer: Self-pay | Admitting: *Deleted

## 2012-06-02 ENCOUNTER — Emergency Department (HOSPITAL_COMMUNITY)
Admission: EM | Admit: 2012-06-02 | Discharge: 2012-06-02 | Disposition: A | Discharge status: Home / Self Care | Payer: 59 | Attending: Emergency Medicine | Admitting: Emergency Medicine

## 2012-06-02 DIAGNOSIS — H9209 Otalgia, unspecified ear: Secondary | ICD-10-CM | POA: Insufficient documentation

## 2012-06-02 DIAGNOSIS — E785 Hyperlipidemia, unspecified: Secondary | ICD-10-CM | POA: Insufficient documentation

## 2012-06-02 DIAGNOSIS — H9202 Otalgia, left ear: Secondary | ICD-10-CM

## 2012-06-02 DIAGNOSIS — H53149 Visual discomfort, unspecified: Secondary | ICD-10-CM | POA: Insufficient documentation

## 2012-06-02 DIAGNOSIS — Z8742 Personal history of other diseases of the female genital tract: Secondary | ICD-10-CM | POA: Insufficient documentation

## 2012-06-02 DIAGNOSIS — IMO0001 Reserved for inherently not codable concepts without codable children: Secondary | ICD-10-CM | POA: Insufficient documentation

## 2012-06-02 DIAGNOSIS — R519 Headache, unspecified: Secondary | ICD-10-CM

## 2012-06-02 DIAGNOSIS — R42 Dizziness and giddiness: Secondary | ICD-10-CM | POA: Insufficient documentation

## 2012-06-02 DIAGNOSIS — Z8719 Personal history of other diseases of the digestive system: Secondary | ICD-10-CM | POA: Insufficient documentation

## 2012-06-02 DIAGNOSIS — J45909 Unspecified asthma, uncomplicated: Secondary | ICD-10-CM | POA: Insufficient documentation

## 2012-06-02 DIAGNOSIS — Z862 Personal history of diseases of the blood and blood-forming organs and certain disorders involving the immune mechanism: Secondary | ICD-10-CM | POA: Insufficient documentation

## 2012-06-02 DIAGNOSIS — R51 Headache: Secondary | ICD-10-CM | POA: Insufficient documentation

## 2012-06-02 DIAGNOSIS — J329 Chronic sinusitis, unspecified: Secondary | ICD-10-CM | POA: Insufficient documentation

## 2012-06-02 DIAGNOSIS — Z8739 Personal history of other diseases of the musculoskeletal system and connective tissue: Secondary | ICD-10-CM | POA: Insufficient documentation

## 2012-06-02 DIAGNOSIS — Z8659 Personal history of other mental and behavioral disorders: Secondary | ICD-10-CM | POA: Insufficient documentation

## 2012-06-02 DIAGNOSIS — J3489 Other specified disorders of nose and nasal sinuses: Secondary | ICD-10-CM | POA: Insufficient documentation

## 2012-06-02 MED ORDER — KETOROLAC TROMETHAMINE 60 MG/2ML IM SOLN
60.0000 mg | Freq: Once | INTRAMUSCULAR | Status: AC
  Administered 2012-06-02: 60 mg via INTRAMUSCULAR
  Filled 2012-06-02: qty 2

## 2012-06-02 MED ORDER — ANTIPYRINE-BENZOCAINE 5.4-1.4 % OT SOLN
3.0000 [drp] | Freq: Once | OTIC | Status: AC
  Administered 2012-06-02: 4 [drp] via OTIC
  Filled 2012-06-02: qty 10

## 2012-06-02 MED ORDER — PREDNISONE 10 MG PO TABS: ORAL_TABLET | ORAL | Status: DC

## 2012-06-02 NOTE — ED Notes (Signed)
Headache, feels "off balance", fatigue.

## 2012-06-02 NOTE — ED Provider Notes (Signed)
History     CSN: [id number]  Arrival date & time 06/02/12  1415   First MD Initiated Contact with Patient 06/02/12 1528      Chief Complaint  Patient presents with  . Headache    (Consider location/radiation/quality/duration/timing/severity/associated sxs/prior treatment) HPI Comments: Patient c/o intermittent frontal headaches with sensation of "feeling off balance" for 5 years. She states she feels like "I have water or fluid in my ears".  She states this has been a chronic problem for a long time and has seen her PMD and 9 different ENT doctor regarding this problem.  States the last one she saw was at [facility] and was told that she may need "tubes in my ears".  She states the headache is intermittent and feels like pressure.  She also c/o nasal congestion and drainage.  She denies fever, vomiting, chest pain, shortness of breath, numbness or neck pain or stiffness.   Patient is a 42 y.o. female presenting with headaches. The history is provided by the patient.  Headache  This is a chronic problem. The current episode started more than 1 week ago. The problem occurs every few minutes. Progression since onset: waxing and waning. The headache is associated with bright light (left ear pain). The pain is located in the frontal and bilateral region. The quality of the pain is described as dull. The pain is mild. The pain does not radiate. Pertinent negatives include no fever, no chest pressure, no near-syncope, no orthopnea, no palpitations, no syncope, no shortness of breath, no nausea and no vomiting. She has tried NSAIDs (sinus medications) for the symptoms. The treatment provided no relief.    Past Medical History  Diagnosis Date  . Anemia     iron 1 yr ago, normal hgb 7/12  . Asthma   . Sinus drainage   . Anxiety   . Hyperlipidemia   . Shortness of breath   . GERD (gastroesophageal reflux disease)   . Ovarian cyst, right   . Fibromyalgia   . Allergy   . TMJ (dislocation of  temporomandibular joint)   . Chronic sinusitis     Past Surgical History  Procedure Date  . Partial hysterectomy 93  . Cesarean section     x2  . Ovarian cyst removal 92 removal of cysts from behind ovaries  . Cholecystectomy 2005    cholelithiasis  . Tonsillectomy     2010  . Septoplasty     2011  . Esophagogastroduodenoscopy 01/23/2011    Procedure: ESOPHAGOGASTRODUODENOSCOPY (EGD);  Surgeon: Corbin Ade, MD;  [facility];  Service: Endoscopy;  Laterality: N/A;  9:00am  . Maloney dilation 01/23/2011    Procedure: Elease Hashimoto DILATION;  Surgeon: Corbin Ade, MD;  Location: [facility];  Service: Endoscopy;  Laterality: N/A;  . Upper gastrointestinal endoscopy   . Abdominal hysterectomy     Family History  Problem Relation Age of Onset  . Diabetes Father   . Hypertension Mother   . Anesthesia problems Neg Hx   . Malignant hyperthermia Neg Hx   . Hypotension Neg Hx   . Pseudochol deficiency Neg Hx     History  Substance Use Topics  . Smoking status: Never Smoker   . Smokeless tobacco: Not on file  . Alcohol Use: No    OB History    Grav Para Term Preterm Abortions TAB SAB Ect Mult Living                  Review  of Systems  Constitutional: Negative for fever, chills, activity change and appetite change.  HENT: Positive for ear pain, congestion and sinus pressure. Negative for facial swelling, trouble swallowing, neck pain and neck stiffness.   Eyes: Positive for photophobia. Negative for pain and visual disturbance.  Respiratory: Negative for chest tightness and shortness of breath.   Cardiovascular: Negative for chest pain, palpitations, orthopnea, syncope and near-syncope.  Gastrointestinal: Negative for nausea and vomiting.  Genitourinary: Negative for dysuria and flank pain.  Musculoskeletal: Negative for arthralgias.  Skin: Negative for rash and wound.  Neurological: Positive for dizziness and headaches. Negative for seizures, syncope, facial  asymmetry, speech difficulty, weakness and numbness.  Hematological: Negative for adenopathy.  Psychiatric/Behavioral: Negative for confusion and decreased concentration.  All other systems reviewed and are negative.    Allergies  Hydrocodone-acetaminophen and Percocet  Home Medications   Current Outpatient Rx  Name  Route  Sig  Dispense  Refill  . IBUPROFEN 200 MG PO TABS   Oral   Take 600 mg by mouth every 6 (six) hours as needed. For fever/pain         . PENICILLIN V POTASSIUM 500 MG PO TABS   Oral   Take 1 tablet (500 mg total) by mouth 4 (four) times daily.   40 tablet   0     BP 104/65  Pulse 73  Temp 98 F (36.7 C) (Oral)  Resp 18  Ht 5' 4.5" (1.638 m)  Wt 159 lb (72.122 kg)  BMI 26.87 kg/m2  SpO2 100%  Physical Exam  Nursing note and vitals reviewed. Constitutional: She is oriented to person, place, and time. She appears well-developed and well-nourished. No distress.  HENT:  Head: Normocephalic and atraumatic.  Right Ear: External ear and ear canal normal. No swelling. No mastoid tenderness. Tympanic membrane is not erythematous and not bulging. No hemotympanum.  Left Ear: External ear and ear canal normal. No swelling. No mastoid tenderness. Tympanic membrane is not erythematous and not bulging. No hemotympanum.  Nose: Mucosal edema present. No epistaxis. Right sinus exhibits frontal sinus tenderness. Right sinus exhibits no maxillary sinus tenderness. Left sinus exhibits frontal sinus tenderness. Left sinus exhibits no maxillary sinus tenderness.  Mouth/Throat: Uvula is midline, oropharynx is clear and moist and mucous membranes are normal.       Bilateral fluids levels visualized behind TM's.  No bulging , erythema or perforation of the TM's  Eyes: Conjunctivae normal and EOM are normal. Pupils are equal, round, and reactive to light.  Neck: Normal range of motion, full passive range of motion without pain and phonation normal. Neck supple. No spinous  process tenderness and no muscular tenderness present. No Brudzinski's sign and no Kernig's sign noted.  Cardiovascular: Normal rate, regular rhythm, normal heart sounds and intact distal pulses.   No murmur heard. Pulmonary/Chest: Effort normal and breath sounds normal. No respiratory distress.  Musculoskeletal: Normal range of motion.  Lymphadenopathy:    She has no cervical adenopathy.  Neurological: She is alert and oriented to person, place, and time. No cranial nerve deficit or sensory deficit. She exhibits normal muscle tone. Coordination normal.  Reflex Scores:      Tricep reflexes are 2+ on the right side and 2+ on the left side.      Bicep reflexes are 2+ on the right side and 2+ on the left side. Skin: Skin is warm and dry.    ED Course  Procedures (including critical care time)  Labs Reviewed - No data  to display Ct Head Wo Contrast  06/02/2012  *RADIOLOGY REPORT*  Clinical Data: Chronic left sided headache.  CT HEAD WITHOUT CONTRAST  Technique:  Contiguous axial images were obtained from the base of the skull through the vertex without contrast.  Comparison: 02/04/2011.  Findings: The cortical sulci remain minimally prominent.  The ventricles remain normal in size and position.  No intracranial hemorrhage, mass lesion or CT evidence of acute infarction. Unremarkable bones and included paranasal sinuses.  IMPRESSION: Stable minimal cortical atrophy.  No acute abnormality.   Original Report Authenticated By: Beckie Salts, M.D.       Orthostatic vitals reviewed.  MDM    Patient is alert, vitals are stable.  She is well appearing.  No focal neuro deficits, no meningeal signs.  Pt ambulates with a steady gait.  Sx's are chronic.  She was seen here yesterday for similar sx's , ED chart and EKG reviewed.    Doubt emergent neurological or infectious process.  Pt agrees to close f/u with her ENT   Pain improved after IM toradol.    Prescribed: Prednisone taper   Anabelen Kaminsky L.  [place], [place] 06/02/12 2102

## 2012-06-02 NOTE — ED Provider Notes (Signed)
Medical screening examination/treatment/procedure(s) were performed by non-physician practitioner and as supervising physician I was immediately available for consultation/collaboration. Devoria Albe, MD, Armando Gang   Ward Givens, MD 06/02/12 [phone]

## 2012-06-02 NOTE — ED Notes (Addendum)
Pt c/o headache, left ear pain, states that she has been having problems with headaches for the past few months, feels "off balance" at times, esp when standing, headache is bothered by bright lights, denies any n/v,

## 2012-06-08 NOTE — ED Provider Notes (Signed)
Medical screening examination/treatment/procedure(s) were performed by non-physician practitioner and as supervising physician I was immediately available for consultation/collaboration.   Shelda Jakes, MD 06/08/12 [phone]

## 2012-06-23 ENCOUNTER — Ambulatory Visit (INDEPENDENT_AMBULATORY_CARE_PROVIDER_SITE_OTHER): Payer: 59 | Admitting: Otolaryngology

## 2012-07-10 ENCOUNTER — Emergency Department (HOSPITAL_COMMUNITY)
Admission: EM | Admit: 2012-07-10 | Discharge: 2012-07-10 | Disposition: A | Discharge status: Home / Self Care | Payer: 59 | Attending: Emergency Medicine | Admitting: Emergency Medicine

## 2012-07-10 ENCOUNTER — Encounter (HOSPITAL_COMMUNITY): Payer: Self-pay | Admitting: Emergency Medicine

## 2012-07-10 DIAGNOSIS — Z8639 Personal history of other endocrine, nutritional and metabolic disease: Secondary | ICD-10-CM | POA: Insufficient documentation

## 2012-07-10 DIAGNOSIS — Z79899 Other long term (current) drug therapy: Secondary | ICD-10-CM | POA: Insufficient documentation

## 2012-07-10 DIAGNOSIS — Z8659 Personal history of other mental and behavioral disorders: Secondary | ICD-10-CM | POA: Insufficient documentation

## 2012-07-10 DIAGNOSIS — R110 Nausea: Secondary | ICD-10-CM | POA: Insufficient documentation

## 2012-07-10 DIAGNOSIS — J45909 Unspecified asthma, uncomplicated: Secondary | ICD-10-CM | POA: Insufficient documentation

## 2012-07-10 DIAGNOSIS — K219 Gastro-esophageal reflux disease without esophagitis: Secondary | ICD-10-CM | POA: Insufficient documentation

## 2012-07-10 DIAGNOSIS — Z8742 Personal history of other diseases of the female genital tract: Secondary | ICD-10-CM | POA: Insufficient documentation

## 2012-07-10 DIAGNOSIS — R0981 Nasal congestion: Secondary | ICD-10-CM

## 2012-07-10 DIAGNOSIS — H81399 Other peripheral vertigo, unspecified ear: Secondary | ICD-10-CM | POA: Insufficient documentation

## 2012-07-10 DIAGNOSIS — J3489 Other specified disorders of nose and nasal sinuses: Secondary | ICD-10-CM | POA: Insufficient documentation

## 2012-07-10 DIAGNOSIS — R42 Dizziness and giddiness: Secondary | ICD-10-CM | POA: Insufficient documentation

## 2012-07-10 DIAGNOSIS — Z8739 Personal history of other diseases of the musculoskeletal system and connective tissue: Secondary | ICD-10-CM | POA: Insufficient documentation

## 2012-07-10 DIAGNOSIS — R51 Headache: Secondary | ICD-10-CM | POA: Insufficient documentation

## 2012-07-10 DIAGNOSIS — Z862 Personal history of diseases of the blood and blood-forming organs and certain disorders involving the immune mechanism: Secondary | ICD-10-CM | POA: Insufficient documentation

## 2012-07-10 HISTORY — DX: Other chronic pain: G89.29

## 2012-07-10 HISTORY — DX: Dizziness and giddiness: R42

## 2012-07-10 HISTORY — DX: Headache, unspecified: R51.9

## 2012-07-10 HISTORY — DX: Headache: R51

## 2012-07-10 MED ORDER — MECLIZINE HCL 12.5 MG PO TABS
25.0000 mg | ORAL_TABLET | Freq: Once | ORAL | Status: AC
  Administered 2012-07-10: 25 mg via ORAL
  Filled 2012-07-10: qty 2

## 2012-07-10 MED ORDER — ONDANSETRON HCL 4 MG PO TABS: 4.0000 mg | ORAL_TABLET | Freq: Three times a day (TID) | ORAL | Status: DC | PRN

## 2012-07-10 MED ORDER — ONDANSETRON 8 MG PO TBDP
8.0000 mg | ORAL_TABLET | Freq: Once | ORAL | Status: AC
  Administered 2012-07-10: 8 mg via ORAL
  Filled 2012-07-10: qty 1

## 2012-07-10 MED ORDER — MECLIZINE HCL 25 MG PO TABS: 25.0000 mg | ORAL_TABLET | Freq: Three times a day (TID) | ORAL | Status: DC | PRN

## 2012-07-10 NOTE — ED Notes (Signed)
Patient states that she has been having bilateral ear pain, headaches and dizziness for over 4 years.  States that her headache, ear pain and dizziness are worse today than usual.  States that she was seen about 1 month ago for the same thing.

## 2012-07-10 NOTE — ED Provider Notes (Signed)
History     CSN: [id number]  Arrival date & time 07/10/12  8657   First MD Initiated Contact with Patient 07/10/12 1004      Chief Complaint  Patient presents with  . Otalgia  . Headache  . Dizziness     HPI Pt was seen at 1045.   Per pt, c/o gradual onset and persistence of constant acute flair of her chronic ears congestion, sinus congestion, and nasal congestion for the past 5 years, worse over the past several days.  Has been associated with "dizziness" described as "swimmy headed" as well as nausea.  Denies any change in her usual chronic long standing symptoms.  Denies fevers, no rash, no CP/SOB, no vomiting/diarrhea, no abd pain, no sore throat, no focal motor weakness, no tingling/numbness in extremities, no visual changes.    Past Medical History  Diagnosis Date  . Anemia     iron 1 yr ago, normal hgb 7/12  . Asthma   . Sinus drainage   . Anxiety   . Hyperlipidemia   . Shortness of breath   . GERD (gastroesophageal reflux disease)   . Ovarian cyst, right   . Fibromyalgia   . Allergy   . TMJ (dislocation of temporomandibular joint)   . Chronic sinusitis   . Vertigo   . Chronic headaches     Past Surgical History  Procedure Laterality Date  . Partial hysterectomy  93  . Cesarean section      x2  . Ovarian cyst removal  92 removal of cysts from behind ovaries  . Cholecystectomy  2005    cholelithiasis  . Tonsillectomy      2010  . Septoplasty      2011  . Esophagogastroduodenoscopy  01/23/2011    Procedure: ESOPHAGOGASTRODUODENOSCOPY (EGD);  Surgeon: Corbin Ade, MD;  [facility]: [facility];  Service: Endoscopy;  Laterality: N/A;  9:00am  . Maloney dilation  01/23/2011    Procedure: Elease Hashimoto DILATION;  Surgeon: Corbin Ade, MD;  Location: [facility];  Service: Endoscopy;  Laterality: N/A;  . Upper gastrointestinal endoscopy    . Abdominal hysterectomy      Family History  Problem Relation Age of Onset  . Diabetes Father   . Hypertension Mother    . Anesthesia problems Neg Hx   . Malignant hyperthermia Neg Hx   . Hypotension Neg Hx   . Pseudochol deficiency Neg Hx     History  Substance Use Topics  . Smoking status: Never Smoker   . Smokeless tobacco: Not on file  . Alcohol Use: No     Review of Systems ROS: Statement: All systems negative except as marked or noted in the HPI; Constitutional: Negative for fever and chills. ; ; Eyes: Negative for eye pain, redness and discharge. ; ; ENMT: Negative for ear pain, hoarseness, sore throat. +ears congestion, nasal congestion, sinus pressure/headache. ; ; Cardiovascular: Negative for chest pain, palpitations, diaphoresis, dyspnea and peripheral edema. ; ; Respiratory: Negative for cough, wheezing and stridor. ; ; Gastrointestinal: Negative for nausea, vomiting, diarrhea, abdominal pain, blood in stool, hematemesis, jaundice and rectal bleeding. . ; ; Genitourinary: Negative for dysuria, flank pain and hematuria. ; ; Musculoskeletal: Negative for back pain and neck pain. Negative for swelling and trauma.; ; Skin: Negative for pruritus, rash, abrasions, blisters, bruising and skin lesion.; ; Neuro: +vertigo.  Negative for lightheadedness and neck stiffness. Negative for weakness, altered level of consciousness , altered mental status, extremity weakness, paresthesias, involuntary movement, seizure  and syncope.       Allergies  Hydrocodone-acetaminophen and Percocet  Home Medications   Current Outpatient Rx  Name  Route  Sig  Dispense  Refill  . ibuprofen (ADVIL,MOTRIN) 200 MG tablet   Oral   Take 600 mg by mouth every 6 (six) hours as needed for pain.          Marland Kitchen omeprazole (PRILOSEC) 20 MG capsule   Oral   Take 20 mg by mouth daily.           BP 103/59  Pulse 70  Temp(Src) 97.9 F (36.6 C) (Oral)  Resp 16  Ht 5\' 5"  (1.651 m)  Wt 160 lb (72.576 kg)  BMI 26.63 kg/m2  SpO2 100%  Physical Exam 1050: Physical examination:  Nursing notes reviewed; Vital signs and O2  SAT reviewed;  Constitutional: Well developed, Well nourished, Well hydrated, In no acute distress; Head:  Normocephalic, atraumatic; Eyes: EOMI, PERRL, No scleral icterus; ENMT: TM's clear bilat. +clear fluid behind TM's bilat. +edemetous nasal turbinates bilat with clear rhinorrhea.  Mouth and pharynx normal, Mucous membranes moist; Neck: Supple, Full range of motion, No lymphadenopathy; Cardiovascular: Regular rate and rhythm, No murmur, rub, or gallop; Respiratory: Breath sounds clear & equal bilaterally, No rales, rhonchi, wheezes.  Speaking full sentences with ease, Normal respiratory effort/excursion; Chest: Nontender, Movement normal; Abdomen: Soft, Nontender, Nondistended, Normal bowel sounds;; Extremities: Pulses normal, No tenderness, No edema, No calf edema or asymmetry.; Neuro: AA&Ox3, Major CN grossly intact.  Strength 5/5 equal bilat UE's and LE's.  DTR 2/4 equal bilat UE's and LE's.  No gross sensory deficits.  Normal cerebellar testing bilat UE's (finger-nose) and LE's (heel-shin).  No pronator drift.  Speech clear.  No facial droop.  +right horizontal gaze fatigable nystagmus which reproduces pt's symptoms;.; Skin: Color normal, Warm, Dry.   ED Course  Procedures     MDM  MDM Reviewed: nursing note, vitals and previous chart     1130:  Long hx of same symptoms today for the past 5 years; denies any change in her usual symptoms. Will tx symptomatically at this time.  States she has an appt with an ENT tomorrow; endorses she has "already seen 9 of them" for these symptoms and "one thinks I should have tubes put in my ears." Strongly encouraged to keep her appt tomorrow with the ENT for good continuity of care and control of her chronic symptoms. Verb understanding.        Laray Anger, DO 07/12/12 1743

## 2012-07-10 NOTE — ED Notes (Signed)
RN at bedside

## 2012-07-18 ENCOUNTER — Other Ambulatory Visit: Payer: Self-pay | Admitting: Otolaryngology

## 2012-07-18 DIAGNOSIS — M26629 Arthralgia of temporomandibular joint, unspecified side: Secondary | ICD-10-CM

## 2012-07-21 ENCOUNTER — Ambulatory Visit: Payer: 59 | Admitting: Family Medicine

## 2012-07-22 ENCOUNTER — Other Ambulatory Visit: Payer: 59

## 2012-07-23 ENCOUNTER — Other Ambulatory Visit: Payer: 59

## 2012-07-31 ENCOUNTER — Other Ambulatory Visit: Payer: 59

## 2012-08-04 ENCOUNTER — Ambulatory Visit: Payer: 59 | Admitting: Family Medicine

## 2012-08-05 ENCOUNTER — Other Ambulatory Visit: Payer: 59

## 2012-08-14 ENCOUNTER — Ambulatory Visit
Admission: RE | Admit: 2012-08-14 | Discharge: 2012-08-14 | Disposition: A | Discharge status: Home / Self Care | Payer: 59 | Source: Ambulatory Visit | Attending: Otolaryngology | Admitting: Otolaryngology

## 2012-08-14 DIAGNOSIS — M26629 Arthralgia of temporomandibular joint, unspecified side: Secondary | ICD-10-CM

## 2012-08-18 ENCOUNTER — Encounter (HOSPITAL_COMMUNITY): Payer: Self-pay | Admitting: *Deleted

## 2012-08-18 ENCOUNTER — Emergency Department (HOSPITAL_COMMUNITY)
Admission: EM | Admit: 2012-08-18 | Discharge: 2012-08-19 | Disposition: A | Discharge status: Home / Self Care | Payer: 59 | Attending: Emergency Medicine | Admitting: Emergency Medicine

## 2012-08-18 ENCOUNTER — Emergency Department (HOSPITAL_COMMUNITY): Payer: 59

## 2012-08-18 DIAGNOSIS — Z8669 Personal history of other diseases of the nervous system and sense organs: Secondary | ICD-10-CM | POA: Insufficient documentation

## 2012-08-18 DIAGNOSIS — Z8679 Personal history of other diseases of the circulatory system: Secondary | ICD-10-CM | POA: Insufficient documentation

## 2012-08-18 DIAGNOSIS — Y939 Activity, unspecified: Secondary | ICD-10-CM | POA: Insufficient documentation

## 2012-08-18 DIAGNOSIS — Z8639 Personal history of other endocrine, nutritional and metabolic disease: Secondary | ICD-10-CM | POA: Insufficient documentation

## 2012-08-18 DIAGNOSIS — Z8742 Personal history of other diseases of the female genital tract: Secondary | ICD-10-CM | POA: Insufficient documentation

## 2012-08-18 DIAGNOSIS — Z79899 Other long term (current) drug therapy: Secondary | ICD-10-CM | POA: Insufficient documentation

## 2012-08-18 DIAGNOSIS — Z8781 Personal history of (healed) traumatic fracture: Secondary | ICD-10-CM | POA: Insufficient documentation

## 2012-08-18 DIAGNOSIS — Z9089 Acquired absence of other organs: Secondary | ICD-10-CM | POA: Insufficient documentation

## 2012-08-18 DIAGNOSIS — S139XXA Sprain of joints and ligaments of unspecified parts of neck, initial encounter: Secondary | ICD-10-CM | POA: Insufficient documentation

## 2012-08-18 DIAGNOSIS — Z8739 Personal history of other diseases of the musculoskeletal system and connective tissue: Secondary | ICD-10-CM | POA: Insufficient documentation

## 2012-08-18 DIAGNOSIS — Y92410 Unspecified street and highway as the place of occurrence of the external cause: Secondary | ICD-10-CM | POA: Insufficient documentation

## 2012-08-18 DIAGNOSIS — J45909 Unspecified asthma, uncomplicated: Secondary | ICD-10-CM | POA: Insufficient documentation

## 2012-08-18 DIAGNOSIS — Z8659 Personal history of other mental and behavioral disorders: Secondary | ICD-10-CM | POA: Insufficient documentation

## 2012-08-18 DIAGNOSIS — Z862 Personal history of diseases of the blood and blood-forming organs and certain disorders involving the immune mechanism: Secondary | ICD-10-CM | POA: Insufficient documentation

## 2012-08-18 DIAGNOSIS — K219 Gastro-esophageal reflux disease without esophagitis: Secondary | ICD-10-CM | POA: Insufficient documentation

## 2012-08-18 DIAGNOSIS — S161XXA Strain of muscle, fascia and tendon at neck level, initial encounter: Secondary | ICD-10-CM

## 2012-08-18 DIAGNOSIS — Z8709 Personal history of other diseases of the respiratory system: Secondary | ICD-10-CM | POA: Insufficient documentation

## 2012-08-18 MED ORDER — IBUPROFEN 800 MG PO TABS
800.0000 mg | ORAL_TABLET | Freq: Once | ORAL | Status: AC
  Administered 2012-08-18: 800 mg via ORAL
  Filled 2012-08-18: qty 1
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
## 2012-08-19 MED ORDER — NAPROXEN 500 MG PO TABS: 500.0000 mg | ORAL_TABLET | Freq: Two times a day (BID) | ORAL | Status: DC

## 2012-08-19 MED ORDER — CYCLOBENZAPRINE HCL 5 MG PO TABS: 5.0000 mg | ORAL_TABLET | Freq: Three times a day (TID) | ORAL | Status: DC | PRN
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
## 2012-08-25 ENCOUNTER — Encounter: Payer: Self-pay | Admitting: Family Medicine

## 2012-08-25 ENCOUNTER — Ambulatory Visit (INDEPENDENT_AMBULATORY_CARE_PROVIDER_SITE_OTHER): Payer: 59 | Admitting: Family Medicine

## 2012-08-25 VITALS — BP 108/60 | HR 64 | Resp 18 | Ht 63.5 in | Wt 165.1 lb

## 2012-08-25 DIAGNOSIS — S139XXA Sprain of joints and ligaments of unspecified parts of neck, initial encounter: Secondary | ICD-10-CM
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
## 2012-08-25 NOTE — Patient Instructions (Addendum)
Continue flexeril at bedtime Continue current medications Start physical therapy  F/U as needed or as previous for physical

## 2012-08-25 NOTE — Assessment & Plan Note (Signed)
Per above PT, release back to work

## 2012-08-25 NOTE — Assessment & Plan Note (Addendum)
She still has a lot of spasm, decreased ROM, will send to PT she will benefit from therapy

## 2012-09-05 ENCOUNTER — Ambulatory Visit (HOSPITAL_COMMUNITY)
Admission: RE | Admit: 2012-09-05 | Discharge: 2012-09-05 | Disposition: A | Discharge status: Home / Self Care | Payer: 59 | Source: Ambulatory Visit | Attending: Family Medicine | Admitting: Family Medicine

## 2012-09-05 DIAGNOSIS — M542 Cervicalgia: Secondary | ICD-10-CM | POA: Insufficient documentation

## 2012-09-05 DIAGNOSIS — M6281 Muscle weakness (generalized): Secondary | ICD-10-CM | POA: Insufficient documentation

## 2012-09-05 DIAGNOSIS — IMO0001 Reserved for inherently not codable concepts without codable children: Secondary | ICD-10-CM | POA: Insufficient documentation
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
## 2012-09-07 ENCOUNTER — Ambulatory Visit (HOSPITAL_COMMUNITY)
Admission: RE | Admit: 2012-09-07 | Discharge: 2012-09-07 | Disposition: A | Discharge status: Home / Self Care | Payer: 59 | Source: Ambulatory Visit | Attending: Family Medicine | Admitting: Family Medicine
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
## 2012-09-12 ENCOUNTER — Ambulatory Visit (HOSPITAL_COMMUNITY)
Admission: RE | Admit: 2012-09-12 | Discharge: 2012-09-12 | Disposition: A | Discharge status: Home / Self Care | Payer: 59 | Source: Ambulatory Visit | Attending: Family Medicine | Admitting: Family Medicine
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
## 2012-09-13 ENCOUNTER — Ambulatory Visit (INDEPENDENT_AMBULATORY_CARE_PROVIDER_SITE_OTHER): Payer: 59 | Admitting: Family Medicine

## 2012-09-13 ENCOUNTER — Encounter: Payer: Self-pay | Admitting: Family Medicine

## 2012-09-13 VITALS — BP 104/70 | HR 83 | Resp 16 | Wt 163.4 lb

## 2012-09-13 DIAGNOSIS — R5381 Other malaise: Secondary | ICD-10-CM

## 2012-09-13 DIAGNOSIS — E785 Hyperlipidemia, unspecified: Secondary | ICD-10-CM

## 2012-09-13 DIAGNOSIS — F5104 Psychophysiologic insomnia: Secondary | ICD-10-CM | POA: Insufficient documentation

## 2012-09-13 DIAGNOSIS — M797 Fibromyalgia: Secondary | ICD-10-CM

## 2012-09-13 DIAGNOSIS — G4700 Insomnia, unspecified: Secondary | ICD-10-CM

## 2012-09-13 DIAGNOSIS — IMO0001 Reserved for inherently not codable concepts without codable children: Secondary | ICD-10-CM

## 2012-09-13 LAB — CBC WITH DIFFERENTIAL/PLATELET
Basophils Relative: 1 % (ref 0–1)
Lymphocytes Relative: 36 % (ref 12–46)
Lymphs Abs: 2.2 10*3/uL (ref 0.7–4.0)
MCH: 30.5 pg (ref 26.0–34.0)
MCHC: 33.9 g/dL (ref 30.0–36.0)
MCV: 90.1 fL (ref 78.0–100.0)
Monocytes Relative: 8 % (ref 3–12)
Neutro Abs: 3.2 10*3/uL (ref 1.7–7.7)
Neutrophils Relative %: 53 % (ref 43–77)
RDW: 13.5 % (ref 11.5–15.5)

## 2012-09-13 LAB — BASIC METABOLIC PANEL
CO2: 27 mEq/L (ref 19–32)
Chloride: 105 mEq/L (ref 96–112)
Glucose, Bld: 89 mg/dL (ref 70–99)
Potassium: 3.8 mEq/L (ref 3.5–5.3)
Sodium: 138 mEq/L (ref 135–145)

## 2012-09-13 MED ORDER — ZOLPIDEM TARTRATE 10 MG PO TABS: 10.0000 mg | ORAL_TABLET | Freq: Every evening | ORAL | Status: DC | PRN

## 2012-09-13 NOTE — Assessment & Plan Note (Signed)
She continues to have problems with fibromyalgia however has not tolerated medications such as Flexeril and gabapentin. She was unable to afford new visual which is recommended by the rheumatologist. Continue to encourage exercise and proper nutrition. 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

## 2012-09-13 NOTE — Progress Notes (Signed)
  Subjective:    [name], female    DOB: 15-Dec-1970, 42 y.o.   MRN: [id number]  HPI  Patient here complaining of fatigue and insomnia. She works swing shift has been doing so for quite some time. She's history fibromyalgia. Her fibromyalgia is often worse after she does not sleep well. She's tried sequel over-the-counter as well as the muscle relaxants however the muscle wrapped it makes her drowsy into the next day. She noticed that her left eye twitches when she is very fatigued. She was evaluated by her ophthalmologist with a normal exam.  Review of Systems  GEN- denies fatigue, fever, weight loss,weakness, recent illness HEENT- denies eye drainage, change in vision, nasal discharge, CVS- denies chest pain, palpitations RESP- denies SOB, cough, wheeze MSK- denies joint pain,+ muscle aches, injury Neuro- denies headache, dizziness, syncope, seizure activity      Objective:   Physical Exam GEN- NAD, alert and oriented x3 HEENT- PERRL, EOMI, non injected sclera, pink conjunctiva, MMM, oropharynx clear Neck- Supple,  CVS- RRR, no murmur RESP-CTAB EXT- No edema Pulses- Radial, DP- 2+ Neuro-CNII-XII in tact, no focal deficits       Assessment & Plan:

## 2012-09-13 NOTE — Assessment & Plan Note (Signed)
Chronic complaint for patient. She is worried about anemia of though her hemoglobins have been stable the last checked one year ago so we will go ahead and check another CBC and metabolic panel today.

## 2012-09-13 NOTE — Patient Instructions (Signed)
Start sleeping medication ambien Also look into Melatonin  Over the counter Continue prilosec  Schedule physical for after 09/17/12 Multivitamin with B12 for energy

## 2012-09-13 NOTE — Assessment & Plan Note (Signed)
Will give her a trial of Ambien I think if we improve her sleep fibromyalgia will also improve

## 2012-09-15 ENCOUNTER — Ambulatory Visit (HOSPITAL_COMMUNITY)
Admission: RE | Admit: 2012-09-15 | Discharge: 2012-09-15 | Disposition: A | Discharge status: Home / Self Care | Payer: 59 | Source: Ambulatory Visit | Attending: Family Medicine | Admitting: Family Medicine
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
## 2012-09-20 ENCOUNTER — Telehealth: Payer: Self-pay | Admitting: Family Medicine

## 2012-09-20 ENCOUNTER — Ambulatory Visit (HOSPITAL_COMMUNITY)
Admission: RE | Admit: 2012-09-20 | Discharge: 2012-09-20 | Disposition: A | Discharge status: Home / Self Care | Payer: 59 | Source: Ambulatory Visit | Attending: Physical Therapy | Admitting: Physical Therapy
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
## 2012-09-20 NOTE — Telephone Encounter (Signed)
Left message that copy of labs were sent and that all was normal

## 2012-09-22 ENCOUNTER — Ambulatory Visit (HOSPITAL_COMMUNITY): Payer: 59 | Admitting: *Deleted

## 2012-09-27 ENCOUNTER — Ambulatory Visit (HOSPITAL_COMMUNITY): Payer: 59 | Admitting: *Deleted

## 2012-09-27 ENCOUNTER — Encounter: Payer: 59 | Admitting: Family Medicine

## 2012-09-29 ENCOUNTER — Telehealth: Payer: Self-pay | Admitting: Family Medicine

## 2012-09-29 ENCOUNTER — Inpatient Hospital Stay (HOSPITAL_COMMUNITY): Admission: RE | Admit: 2012-09-29 | Payer: 59 | Source: Ambulatory Visit | Admitting: Physical Therapy

## 2012-09-29 MED ORDER — IBUPROFEN 800 MG PO TABS: 800.0000 mg | ORAL_TABLET | Freq: Three times a day (TID) | ORAL | Status: DC | PRN

## 2012-09-29 NOTE — Telephone Encounter (Signed)
Leave message if she does not answer

## 2012-09-29 NOTE — Telephone Encounter (Signed)
Medication sent.

## 2012-09-30 NOTE — Telephone Encounter (Signed)
Patient aware.

## 2012-09-30 NOTE — Telephone Encounter (Signed)
Med sent on 5/1

## 2012-10-04 ENCOUNTER — Encounter: Payer: 59 | Admitting: Family Medicine

## 2012-10-06 ENCOUNTER — Ambulatory Visit (INDEPENDENT_AMBULATORY_CARE_PROVIDER_SITE_OTHER): Payer: Medicare HMO | Admitting: Family Medicine

## 2012-10-06 ENCOUNTER — Encounter: Payer: Self-pay | Admitting: Family Medicine

## 2012-10-06 VITALS — BP 96/72 | HR 70 | Resp 18 | Ht 63.5 in | Wt 162.1 lb

## 2012-10-06 DIAGNOSIS — IMO0001 Reserved for inherently not codable concepts without codable children: Secondary | ICD-10-CM

## 2012-10-06 DIAGNOSIS — M797 Fibromyalgia: Secondary | ICD-10-CM

## 2012-10-06 DIAGNOSIS — G4700 Insomnia, unspecified: Secondary | ICD-10-CM

## 2012-10-06 DIAGNOSIS — M542 Cervicalgia: Secondary | ICD-10-CM

## 2012-10-06 MED ORDER — DULOXETINE HCL 30 MG PO CPEP: 30.0000 mg | ORAL_CAPSULE | Freq: Every day | ORAL | Status: DC

## 2012-10-06 NOTE — Patient Instructions (Addendum)
Start cymbalta Take only 5mg  of flexeril You can stop the Cale  F/U for your physical

## 2012-10-09 NOTE — Assessment & Plan Note (Signed)
Needs improved rest Now with generlized pain and paresthesia Trial of cymbalta for pain, depressive symptoms and neuralgia

## 2012-10-09 NOTE — Assessment & Plan Note (Signed)
Stop ambien, cymbalta given and flexeril

## 2012-10-09 NOTE — Progress Notes (Signed)
  Subjective:    [name], female    DOB: 04/18/1971, 42 y.o.   MRN: [id number]  HPI  Pt here with fatigue, tingling numbness in hands and legs, generalized pain and achiness, not sleeping well despite medications. Sleeps only a few hours with ambien then wakes up. Neck has been bothering her still using chiropractor, wants to use flexeril again Her fibromyaglia is is acting up.  Review of Systems - per above  GEN- denies fatigue, fever, weight loss,weakness, recent illness HEENT- denies eye drainage, change in vision, nasal discharge, CVS- denies chest pain, palpitations RESP- denies SOB, cough, wheeze ABD- denies N/V, change in stools, abd pain GU- denies dysuria, hematuria, dribbling, incontinence MSK-+ joint pain, +muscle aches, injury Neuro- denies headache, dizziness, syncope, seizure activity      Objective:   Physical Exam GEN- NAD, alert and oriented x3 HEENT- PERRL, EOMI, non injected sclera, pink conjunctiva, MMM, oropharynx clear Neck- Supple,  CVS- RRR, no murmur RESP-CTAB EXT- No edema Pulses- Radial, DP- 2+ Neuro-CNII-XII in tact, no focal deficits, neg tinels, ? phalen's sign on Left hand Psych- anxious appearing, not overly depressed      Assessment & Plan:

## 2012-10-09 NOTE — Assessment & Plan Note (Signed)
She is seeing chiropracter, she wishes to return to flexeril made her sleep better

## 2012-10-11 ENCOUNTER — Encounter: Payer: Self-pay | Admitting: Family Medicine

## 2012-10-11 ENCOUNTER — Ambulatory Visit (INDEPENDENT_AMBULATORY_CARE_PROVIDER_SITE_OTHER): Payer: Medicare HMO | Admitting: Family Medicine

## 2012-10-11 VITALS — BP 110/74 | HR 70 | Resp 16 | Wt 160.1 lb

## 2012-10-11 DIAGNOSIS — Z01419 Encounter for gynecological examination (general) (routine) without abnormal findings: Secondary | ICD-10-CM

## 2012-10-11 DIAGNOSIS — E785 Hyperlipidemia, unspecified: Secondary | ICD-10-CM

## 2012-10-11 LAB — LIPID PANEL
HDL: 49 mg/dL (ref >39)
LDL Cholesterol: 105 mg/dL — ABNORMAL HIGH (ref 0–99)
Triglycerides: 117 mg/dL (ref <150)
VLDL: 23 mg/dL (ref 0–40)

## 2012-10-11 LAB — HEPATIC FUNCTION PANEL
Albumin: 3.8 g/dL (ref 3.5–5.2)
Total Protein: 6.8 g/dL (ref 6.0–8.3)

## 2012-10-11 NOTE — Patient Instructions (Addendum)
I recommend eye visit once a year I recommend dental visit every 6 months Goal is to  Exercise 30 minutes 5 days a week We will send a letter with lab results  Recommend you start the cymbalta as prescribed Schedule your Mammogram for June F/U 3 months [employer]

## 2012-10-14 NOTE — Progress Notes (Signed)
  Subjective:    [name], female    DOB: 07-Nov-1970, 42 y.o.   MRN: [id number]  HPI Pt here for GYN exam, no new concerns. Seen last week Sleep improved with flexeril Last set of fasting labs reviewed Immunizations UTD   Review of Systems  GEN- denies fatigue, fever, weight loss,weakness, recent illness HEENT- denies eye drainage, change in vision, nasal discharge, CVS- denies chest pain, palpitations RESP- denies SOB, cough, wheeze ABD- denies N/V, change in stools, abd pain GU- denies dysuria, hematuria, dribbling, incontinence MSK- denies joint pain, +muscle aches, injury Neuro- denies headache, dizziness, syncope, seizure activity      Objective:   Physical Exam GEN-NAD,alert and oriented x 3 Breast- normal symmetry, no nipple inversion,no nipple drainage, no nodules or lumps felt Nodes- no axillary nodes GU- normal external genitalia, vaginal mucosa pink and moist, no cervix visualized  no ovarian masses,normal urethra position,no bladder prolapse  Rectal- normal tone, no external lesions, FOBT neg Ext- No edema       Assessment & Plan:   GYN Exam- No PAP Sone, s/p hysterectomy, no abnormal lesions, Mammogram in June  Immunizations UTD, labs - FLP due

## 2012-10-14 NOTE — Assessment & Plan Note (Signed)
Diet controlled.  

## 2012-10-31 ENCOUNTER — Other Ambulatory Visit: Payer: Self-pay | Admitting: Family Medicine

## 2012-10-31 DIAGNOSIS — Z139 Encounter for screening, unspecified: Secondary | ICD-10-CM

## 2012-11-04 ENCOUNTER — Ambulatory Visit (HOSPITAL_COMMUNITY)
Admission: RE | Admit: 2012-11-04 | Discharge: 2012-11-04 | Disposition: A | Discharge status: Home / Self Care | Payer: Managed Care, Other (non HMO) | Source: Ambulatory Visit | Attending: Family Medicine | Admitting: Family Medicine

## 2012-11-04 DIAGNOSIS — Z1231 Encounter for screening mammogram for malignant neoplasm of breast: Secondary | ICD-10-CM | POA: Insufficient documentation

## 2012-11-04 DIAGNOSIS — Z139 Encounter for screening, unspecified: Secondary | ICD-10-CM

## 2012-11-05 ENCOUNTER — Emergency Department (HOSPITAL_COMMUNITY): Payer: Managed Care, Other (non HMO)

## 2012-11-05 ENCOUNTER — Encounter (HOSPITAL_COMMUNITY): Payer: Self-pay | Admitting: *Deleted

## 2012-11-05 ENCOUNTER — Emergency Department (HOSPITAL_COMMUNITY)
Admission: EM | Admit: 2012-11-05 | Discharge: 2012-11-05 | Disposition: A | Discharge status: Home / Self Care | Payer: Managed Care, Other (non HMO) | Attending: Emergency Medicine | Admitting: Emergency Medicine

## 2012-11-05 DIAGNOSIS — Z8742 Personal history of other diseases of the female genital tract: Secondary | ICD-10-CM | POA: Insufficient documentation

## 2012-11-05 DIAGNOSIS — R079 Chest pain, unspecified: Secondary | ICD-10-CM | POA: Insufficient documentation

## 2012-11-05 DIAGNOSIS — R110 Nausea: Secondary | ICD-10-CM | POA: Insufficient documentation

## 2012-11-05 DIAGNOSIS — R109 Unspecified abdominal pain: Secondary | ICD-10-CM | POA: Insufficient documentation

## 2012-11-05 DIAGNOSIS — J45909 Unspecified asthma, uncomplicated: Secondary | ICD-10-CM | POA: Insufficient documentation

## 2012-11-05 DIAGNOSIS — R05 Cough: Secondary | ICD-10-CM | POA: Insufficient documentation

## 2012-11-05 DIAGNOSIS — Z9089 Acquired absence of other organs: Secondary | ICD-10-CM | POA: Insufficient documentation

## 2012-11-05 DIAGNOSIS — R300 Dysuria: Secondary | ICD-10-CM | POA: Insufficient documentation

## 2012-11-05 DIAGNOSIS — Z87828 Personal history of other (healed) physical injury and trauma: Secondary | ICD-10-CM | POA: Insufficient documentation

## 2012-11-05 DIAGNOSIS — H9209 Otalgia, unspecified ear: Secondary | ICD-10-CM | POA: Insufficient documentation

## 2012-11-05 DIAGNOSIS — Z8719 Personal history of other diseases of the digestive system: Secondary | ICD-10-CM | POA: Insufficient documentation

## 2012-11-05 DIAGNOSIS — Z8709 Personal history of other diseases of the respiratory system: Secondary | ICD-10-CM | POA: Insufficient documentation

## 2012-11-05 DIAGNOSIS — Z8659 Personal history of other mental and behavioral disorders: Secondary | ICD-10-CM | POA: Insufficient documentation

## 2012-11-05 DIAGNOSIS — R059 Cough, unspecified: Secondary | ICD-10-CM | POA: Insufficient documentation

## 2012-11-05 DIAGNOSIS — Z90710 Acquired absence of both cervix and uterus: Secondary | ICD-10-CM | POA: Insufficient documentation

## 2012-11-05 DIAGNOSIS — Z862 Personal history of diseases of the blood and blood-forming organs and certain disorders involving the immune mechanism: Secondary | ICD-10-CM | POA: Insufficient documentation

## 2012-11-05 DIAGNOSIS — Z8669 Personal history of other diseases of the nervous system and sense organs: Secondary | ICD-10-CM | POA: Insufficient documentation

## 2012-11-05 DIAGNOSIS — Z8639 Personal history of other endocrine, nutritional and metabolic disease: Secondary | ICD-10-CM | POA: Insufficient documentation

## 2012-11-05 DIAGNOSIS — Z8679 Personal history of other diseases of the circulatory system: Secondary | ICD-10-CM | POA: Insufficient documentation

## 2012-11-05 DIAGNOSIS — Z8739 Personal history of other diseases of the musculoskeletal system and connective tissue: Secondary | ICD-10-CM | POA: Insufficient documentation

## 2012-11-05 DIAGNOSIS — H538 Other visual disturbances: Secondary | ICD-10-CM | POA: Insufficient documentation

## 2012-11-05 DIAGNOSIS — K5900 Constipation, unspecified: Secondary | ICD-10-CM

## 2012-11-05 DIAGNOSIS — R51 Headache: Secondary | ICD-10-CM | POA: Insufficient documentation

## 2012-11-05 HISTORY — DX: Sjogren syndrome, unspecified: M35.00

## 2012-11-05 LAB — CBC WITH DIFFERENTIAL/PLATELET
Basophils Relative: 1 % (ref 0–1)
Hemoglobin: 13.1 g/dL (ref 12.0–15.0)
Lymphs Abs: 2.2 10*3/uL (ref 0.7–4.0)
MCHC: 34.3 g/dL (ref 30.0–36.0)
Monocytes Relative: 7 % (ref 3–12)
Neutro Abs: 1.3 10*3/uL — ABNORMAL LOW (ref 1.7–7.7)
Neutrophils Relative %: 35 % — ABNORMAL LOW (ref 43–77)
Platelets: 323 10*3/uL (ref 150–400)
RBC: 4.15 MIL/uL (ref 3.87–5.11)

## 2012-11-05 LAB — COMPREHENSIVE METABOLIC PANEL
ALT: 11 U/L (ref 0–35)
AST: 18 U/L (ref 0–37)
Albumin: 3.6 g/dL (ref 3.5–5.2)
Alkaline Phosphatase: 65 U/L (ref 39–117)
BUN: 8 mg/dL (ref 6–23)
Chloride: 104 mEq/L (ref 96–112)
Potassium: 4.2 mEq/L (ref 3.5–5.1)
Sodium: 138 mEq/L (ref 135–145)
Total Bilirubin: 0.4 mg/dL (ref 0.3–1.2)

## 2012-11-05 LAB — LIPASE, BLOOD: Lipase: 38 U/L (ref 11–59)

## 2012-11-05 LAB — URINALYSIS, ROUTINE W REFLEX MICROSCOPIC
Bilirubin Urine: NEGATIVE
Nitrite: NEGATIVE
Protein, ur: NEGATIVE mg/dL
Specific Gravity, Urine: 1.02 (ref 1.005–1.030)
Urobilinogen, UA: 0.2 mg/dL (ref 0.0–1.0)

## 2012-11-05 MED ORDER — DIPHENHYDRAMINE HCL 25 MG PO CAPS
25.0000 mg | ORAL_CAPSULE | Freq: Once | ORAL | Status: AC
  Administered 2012-11-05: 25 mg via ORAL
  Filled 2012-11-05: qty 1

## 2012-11-05 MED ORDER — DIPHENHYDRAMINE HCL 25 MG PO TABS: ORAL_TABLET | ORAL | Status: DC

## 2012-11-05 MED ORDER — OXYCODONE-ACETAMINOPHEN 5-325 MG PO TABS: 1.0000 | ORAL_TABLET | ORAL | Status: DC | PRN

## 2012-11-05 MED ORDER — OXYCODONE-ACETAMINOPHEN 5-325 MG PO TABS: 1.0000 | ORAL_TABLET | Freq: Once | ORAL | Status: AC

## 2012-11-05 MED ORDER — OXYCODONE-ACETAMINOPHEN 5-325 MG PO TABS
ORAL_TABLET | ORAL | Status: AC
  Administered 2012-11-05: 1 via ORAL
  Filled 2012-11-05: qty 1

## 2012-11-05 MED ORDER — POLYETHYLENE GLYCOL 3350 17 GM/SCOOP PO POWD: 17.0000 g | Freq: Every day | ORAL | Status: DC

## 2012-11-05 NOTE — ED Provider Notes (Signed)
History    This chart was scribed for Carleene Cooper III, MD by Sofie Rower, ED Scribe. The patient was seen in room [facility] and the patient's care was started at 3:44PM.    CSN: [id number]  Arrival date & time 11/05/12  1253   First MD Initiated Contact with Patient 11/05/12 1544      Chief Complaint  Patient presents with  . Flank Pain    (Consider location/radiation/quality/duration/timing/severity/associated sxs/prior treatment) The history is provided by the patient. No language interpreter was used.    Yvette Mayer is a 42 y.o. female , with a hx of anemia, asthma, anxiety, hyperlipidemia, shortness of breath, GERD, right ovarian cyst, fibromyalgia, Sjogren syndrome abdominal hysterectomy, cesarean section, ovarian cyst removal, cholecystectomy, tonsillectomy,  who presents to the Emergency Department complaining of sudden, progressively worsening, flank pain, located at the right flank, onset two weeks ago.  Associated symptoms include dysuria and nausea, both of which began two weeks ago. The pt reports she has been experiencing a constant, right sided flank pain, radiating from her right lower abdomen to her right flank area, which is intensified by urinating and lying down. The pt has taken ibuprofen, which she informs, does not provide relief of her right flank pain. Furthermore, the pt complains of otalgia, visual disturbance, non productive cough, chest pain and headache, which she believes, have all contributed to her current state of vitality.   The pt denies fever.   The pt does not smoke or drink alcohol.   PCP is Dr. Jeanice Lim.    Past Medical History  Diagnosis Date  . Anemia     iron 1 yr ago, normal hgb 7/12  . Asthma   . Sinus drainage   . Anxiety   . Hyperlipidemia   . Shortness of breath   . GERD (gastroesophageal reflux disease)   . Ovarian cyst, right   . Fibromyalgia   . Allergy   . TMJ (dislocation of temporomandibular joint)   . Chronic  sinusitis   . Vertigo   . Chronic headaches   . Sjoegren syndrome     Past Surgical History  Procedure Laterality Date  . Partial hysterectomy  93  . Cesarean section      x2  . Ovarian cyst removal  92 removal of cysts from behind ovaries  . Cholecystectomy  2005    cholelithiasis  . Tonsillectomy      2010  . Septoplasty      2011  . Esophagogastroduodenoscopy  01/23/2011    Procedure: ESOPHAGOGASTRODUODENOSCOPY (EGD);  Surgeon: Corbin Ade, MD;  Location: [facility];  Service: Endoscopy;  Laterality: N/A;  9:00am  . Maloney dilation  01/23/2011    Procedure: Elease Hashimoto DILATION;  Surgeon: Corbin Ade, MD;  Location: [facility];  Service: Endoscopy;  Laterality: N/A;  . Upper gastrointestinal endoscopy    . Abdominal hysterectomy      Family History  Problem Relation Age of Onset  . Diabetes Father   . Hypertension Mother   . Anesthesia problems Neg Hx   . Malignant hyperthermia Neg Hx   . Hypotension Neg Hx   . Pseudochol deficiency Neg Hx     History  Substance Use Topics  . Smoking status: Never Smoker   . Smokeless tobacco: Not on file  . Alcohol Use: No    OB History   Grav Para Term Preterm Abortions TAB SAB Ect Mult Living  Review of Systems  Constitutional: Negative for fever.  HENT: Positive for ear pain. Negative for sore throat.   Eyes: Positive for visual disturbance.  Respiratory: Positive for cough.   Cardiovascular: Positive for chest pain.  Gastrointestinal: Negative for vomiting and diarrhea.  Genitourinary: Positive for dysuria and flank pain.  Skin: Negative for rash.  Neurological: Positive for headaches. Negative for syncope.  All other systems reviewed and are negative.    Allergies  Hydrocodone-acetaminophen and Percocet  Home Medications   Current Outpatient Rx  Name  Route  Sig  Dispense  Refill  . ibuprofen (ADVIL,MOTRIN) 200 MG tablet   Oral   Take 400 mg by mouth every 6 (six) hours as needed  for pain.           BP 105/62  Pulse 67  Temp(Src) 98 F (36.7 C) (Oral)  Resp 16  Ht 5\' 4"  (1.626 m)  Wt 161 lb (73.029 kg)  BMI 27.62 kg/m2  SpO2 100%  Physical Exam  Nursing note and vitals reviewed. Constitutional: She is oriented to person, place, and time. She appears well-developed and well-nourished. No distress.  HENT:  Head: Normocephalic and atraumatic.  Right Ear: Tympanic membrane normal.  Left Ear: Tympanic membrane normal.  Mouth/Throat: Oropharynx is clear and moist.  Eyes: Conjunctivae and EOM are normal. Pupils are equal, round, and reactive to light.  Neck: Neck supple. No tracheal deviation present.  Cardiovascular: Normal rate, regular rhythm and normal heart sounds.  Exam reveals no gallop and no friction rub.   No murmur heard. Pulmonary/Chest: Effort normal and breath sounds normal. No respiratory distress. She has no wheezes.  Abdominal: Soft. She exhibits no distension.  Pt localizes pain to right lower abdomen.   Musculoskeletal: Normal range of motion. She exhibits no edema.  Pt localizes pain to right flank area.   Lymphadenopathy:    She has no cervical adenopathy.  Neurological: She is alert and oriented to person, place, and time. No sensory deficit.  Skin: Skin is warm and dry.  Psychiatric: She has a normal mood and affect. Her behavior is normal.    ED Course  Procedures (including critical care time)  DIAGNOSTIC STUDIES: Oxygen Saturation is 100% on room air, normal by my interpretation.    COORDINATION OF CARE:   3:54 PM- Treatment plan concerning laboratory analysis and radiologic evaluation discussed with patient. Pt agrees with treatment.   7:36 PM Results for orders placed during the hospital encounter of 11/05/12  URINALYSIS, ROUTINE W REFLEX MICROSCOPIC      Result Value Range   Color, Urine YELLOW  YELLOW   APPearance CLEAR  CLEAR   Specific Gravity, Urine 1.020  1.005 - 1.030   pH 7.0  5.0 - 8.0   Glucose, UA  NEGATIVE  NEGATIVE mg/dL   Hgb urine dipstick NEGATIVE  NEGATIVE   Bilirubin Urine NEGATIVE  NEGATIVE   Ketones, ur NEGATIVE  NEGATIVE mg/dL   Protein, ur NEGATIVE  NEGATIVE mg/dL   Urobilinogen, UA 0.2  0.0 - 1.0 mg/dL   Nitrite NEGATIVE  NEGATIVE   Leukocytes, UA NEGATIVE  NEGATIVE  CBC WITH DIFFERENTIAL      Result Value Range   WBC 3.8 (*) 4.0 - 10.5 K/uL   RBC 4.15  3.87 - 5.11 MIL/uL   Hemoglobin 13.1  12.0 - 15.0 g/dL   HCT 91.4  78.2 - 95.6 %   MCV 92.0  78.0 - 100.0 fL   MCH 31.6  26.0 - 34.0 pg  MCHC 34.3  30.0 - 36.0 g/dL   RDW 96.0  45.4 - 09.8 %   Platelets 323  150 - 400 K/uL   Neutrophils Relative % 35 (*) 43 - 77 %   Neutro Abs 1.3 (*) 1.7 - 7.7 K/uL   Lymphocytes Relative 56 (*) 12 - 46 %   Lymphs Abs 2.2  0.7 - 4.0 K/uL   Monocytes Relative 7  3 - 12 %   Monocytes Absolute 0.3  0.1 - 1.0 K/uL   Eosinophils Relative 1  0 - 5 %   Eosinophils Absolute 0.0  0.0 - 0.7 K/uL   Basophils Relative 1  0 - 1 %   Basophils Absolute 0.0  0.0 - 0.1 K/uL  COMPREHENSIVE METABOLIC PANEL      Result Value Range   Sodium 138  135 - 145 mEq/L   Potassium 4.2  3.5 - 5.1 mEq/L   Chloride 104  96 - 112 mEq/L   CO2 27  19 - 32 mEq/L   Glucose, Bld 91  70 - 99 mg/dL   BUN 8  6 - 23 mg/dL   Creatinine, Ser 1.19  0.50 - 1.10 mg/dL   Calcium 9.0  8.4 - 14.7 mg/dL   Total Protein 7.6  6.0 - 8.3 g/dL   Albumin 3.6  3.5 - 5.2 g/dL   AST 18  0 - 37 U/L   ALT 11  0 - 35 U/L   Alkaline Phosphatase 65  39 - 117 U/L   Total Bilirubin 0.4  0.3 - 1.2 mg/dL   GFR calc non Af Amer >90  >90 mL/min   GFR calc Af Amer >90  >90 mL/min  LIPASE, BLOOD      Result Value Range   Lipase 38  11 - 59 U/L   Ct Abdomen Pelvis Wo Contrast  11/05/2012   *RADIOLOGY REPORT*  Clinical Data: 2-week history of right flank pain. History of Sj?gren's syndrome.  History of cholecystectomy, partial hysterectomy.  CT ABDOMEN AND PELVIS WITHOUT CONTRAST  Technique:  Multidetector CT imaging of the abdomen and  pelvis was performed following the standard protocol without intravenous contrast.  Comparison: CT of the abdomen and pelvis on 02/14/2011  Findings: Lung bases are unremarkable.  No focal abnormality identified within the liver, spleen, pancreas, adrenal glands, or kidneys.  No intrarenal or ureteral calcifications identified.  The stomach and small bowel loops have a normal appearance. The appendix is well seen and has a normal appearance.  Colonic loops have a normal appearance and are moderately distended with stool. The patient has had cholecystectomy.  The uterus is surgically absent.  No adnexal mass identified. Visualized osseous structures have a normal appearance. No retroperitoneal or mesenteric adenopathy. No evidence for aortic aneurysm.  IMPRESSION:  1.  No evidence for acute abnormality in the abdomen pelvis. 2.  Moderate stool burden. 3.  Status post cholecystectomy, hysterectomy. 4.  Normal-appearing appendix.   Original Report Authenticated By: Norva Pavlov, M.D.    Lab tests were normal.  CT showed stool burden, was otherwise normal.  Advised Miralax 17 gm po once a day.  Rx for a small number of Percocet if needed for severe pain.  Labs Reviewed  URINALYSIS, ROUTINE W REFLEX MICROSCOPIC      1. Abdominal pain   2. Constipation      Carleene Cooper III, MD 11/05/12 [phone]

## 2012-11-05 NOTE — ED Notes (Signed)
States ready to go home - hungry - has not had any food - sitting on end of stretcher, dressed, waiting for DC papers.

## 2012-11-05 NOTE — ED Notes (Signed)
Right flank pain x 2 weeks. States "I can feel the pain worse when I use the bathroom and when I lay down"

## 2013-01-03 ENCOUNTER — Ambulatory Visit: Payer: Self-pay | Admitting: Family Medicine

## 2013-01-08 ENCOUNTER — Emergency Department (HOSPITAL_COMMUNITY): Payer: Managed Care, Other (non HMO)

## 2013-01-08 ENCOUNTER — Encounter (HOSPITAL_COMMUNITY): Payer: Self-pay | Admitting: *Deleted

## 2013-01-08 ENCOUNTER — Emergency Department (HOSPITAL_COMMUNITY)
Admission: EM | Admit: 2013-01-08 | Discharge: 2013-01-08 | Disposition: A | Discharge status: Home / Self Care | Payer: Managed Care, Other (non HMO) | Attending: Emergency Medicine | Admitting: Emergency Medicine

## 2013-01-08 DIAGNOSIS — Z8709 Personal history of other diseases of the respiratory system: Secondary | ICD-10-CM | POA: Insufficient documentation

## 2013-01-08 DIAGNOSIS — R0981 Nasal congestion: Secondary | ICD-10-CM

## 2013-01-08 DIAGNOSIS — J45909 Unspecified asthma, uncomplicated: Secondary | ICD-10-CM | POA: Insufficient documentation

## 2013-01-08 DIAGNOSIS — Z8739 Personal history of other diseases of the musculoskeletal system and connective tissue: Secondary | ICD-10-CM | POA: Insufficient documentation

## 2013-01-08 DIAGNOSIS — R209 Unspecified disturbances of skin sensation: Secondary | ICD-10-CM | POA: Insufficient documentation

## 2013-01-08 DIAGNOSIS — J3489 Other specified disorders of nose and nasal sinuses: Secondary | ICD-10-CM | POA: Insufficient documentation

## 2013-01-08 DIAGNOSIS — R202 Paresthesia of skin: Secondary | ICD-10-CM

## 2013-01-08 DIAGNOSIS — Z8742 Personal history of other diseases of the female genital tract: Secondary | ICD-10-CM | POA: Insufficient documentation

## 2013-01-08 DIAGNOSIS — Z8639 Personal history of other endocrine, nutritional and metabolic disease: Secondary | ICD-10-CM | POA: Insufficient documentation

## 2013-01-08 DIAGNOSIS — K219 Gastro-esophageal reflux disease without esophagitis: Secondary | ICD-10-CM | POA: Insufficient documentation

## 2013-01-08 DIAGNOSIS — Z79899 Other long term (current) drug therapy: Secondary | ICD-10-CM | POA: Insufficient documentation

## 2013-01-08 DIAGNOSIS — R200 Anesthesia of skin: Secondary | ICD-10-CM

## 2013-01-08 DIAGNOSIS — Z8659 Personal history of other mental and behavioral disorders: Secondary | ICD-10-CM | POA: Insufficient documentation

## 2013-01-08 DIAGNOSIS — Z862 Personal history of diseases of the blood and blood-forming organs and certain disorders involving the immune mechanism: Secondary | ICD-10-CM | POA: Insufficient documentation

## 2013-01-08 MED ORDER — LORATADINE-PSEUDOEPHEDRINE ER 10-240 MG PO TB24: 1.0000 | ORAL_TABLET | Freq: Every day | ORAL | Status: DC

## 2013-01-08 NOTE — ED Provider Notes (Signed)
CSN: [id number]     Arrival date & time 01/08/13  0522 History     First MD Initiated Contact with Patient 01/08/13 0545     Chief Complaint  Patient presents with  . Numbness   HPI Yvette Mayer is a 42 y.o. female with a history of fibromyalgia, Sjogren's syndrome, and intermittent numbness and tingling of the left side presents for left-sided numbness and tingling. She is no associated weakness. She says it's gotten worse overnight and asthma she came to the ER. She seen her primary care physician for this, has tried various medications, however nothing has worked.  Has not been sick recently, denies any fevers, chills, chest pain, shortness of breath, abdominal pain, nausea vomiting or diarrhea. Patient has chronically congested nasal passages with some postnasal drip. No productive cough. No myalgias, no rashes.   Past Medical History  Diagnosis Date  . Anemia     iron 1 yr ago, normal hgb 7/12  . Asthma   . Sinus drainage   . Anxiety   . Hyperlipidemia   . Shortness of breath   . GERD (gastroesophageal reflux disease)   . Ovarian cyst, right   . Fibromyalgia   . Allergy   . TMJ (dislocation of temporomandibular joint)   . Chronic sinusitis   . Vertigo   . Chronic headaches   . Sjoegren syndrome    Past Surgical History  Procedure Laterality Date  . Partial hysterectomy  93  . Cesarean section      x2  . Ovarian cyst removal  92 removal of cysts from behind ovaries  . Cholecystectomy  2005    cholelithiasis  . Tonsillectomy      2010  . Septoplasty      2011  . Esophagogastroduodenoscopy  01/23/2011    Procedure: ESOPHAGOGASTRODUODENOSCOPY (EGD);  Surgeon: Corbin Ade, MD;  Location: [facility];  Service: Endoscopy;  Laterality: N/A;  9:00am  . Maloney dilation  01/23/2011    Procedure: Elease Hashimoto DILATION;  Surgeon: Corbin Ade, MD;  Location: [facility];  Service: Endoscopy;  Laterality: N/A;  . Upper gastrointestinal endoscopy    . Abdominal  hysterectomy    . Temporomandibular joint surgery     Family History  Problem Relation Age of Onset  . Diabetes Father   . Hypertension Mother   . Anesthesia problems Neg Hx   . Malignant hyperthermia Neg Hx   . Hypotension Neg Hx   . Pseudochol deficiency Neg Hx    History  Substance Use Topics  . Smoking status: Never Smoker   . Smokeless tobacco: Not on file  . Alcohol Use: No   OB History   Grav Para Term Preterm Abortions TAB SAB Ect Mult Living                 Review of Systems At least 10pt or greater review of systems completed and are negative except where specified in the HPI.  Allergies  Hydrocodone-acetaminophen and Percocet  Home Medications   Current Outpatient Rx  Name  Route  Sig  Dispense  Refill  . diphenhydrAMINE (BENADRYL) 25 MG tablet      Take one Benadryl if you take a Percocet to prevent itching.   20 tablet   0   . ibuprofen (ADVIL,MOTRIN) 200 MG tablet   Oral   Take 400 mg by mouth every 6 (six) hours as needed for pain.         Marland Kitchen omeprazole (PRILOSEC) 20  MG capsule   Oral   Take 20 mg by mouth daily.         . polyethylene glycol powder (GLYCOLAX/MIRALAX) powder   Oral   Take 17 g by mouth daily.   255 g   0   . loratadine-pseudoephedrine (CLARITIN-D 24 HOUR) 10-240 MG per 24 hr tablet   Oral   Take 1 tablet by mouth daily.   30 tablet   0   . oxyCODONE-acetaminophen (PERCOCET/ROXICET) 5-325 MG per tablet   Oral   Take 1 tablet by mouth every 4 (four) hours as needed for pain.   20 tablet   0    BP 105/69  Pulse 78  Temp(Src) 98 F (36.7 C) (Oral)  Ht 5\' 4"  (1.626 m)  Wt 160 lb (72.576 kg)  BMI 27.45 kg/m2  SpO2 98% Physical Exam  Nursing notes reviewed.  Electronic medical record reviewed. VITAL SIGNS:   Filed Vitals:   01/08/13 0531 01/08/13 0540  BP: 105/69   Pulse: 78   Temp: 98 F (36.7 C) 98 F (36.7 C)  TempSrc: Oral   Height: 5\' 4"  (1.626 m)   Weight: 160 lb (72.576 kg)   SpO2: 98%     CONSTITUTIONAL: Awake, oriented, appears non-toxic HENT: Atraumatic, normocephalic, oral mucosa pink and moist, airway patent. Nares patent without drainage. External ears normal. EYES: Conjunctiva clear, EOMI, PERRLA NECK: Trachea midline, non-tender, supple CARDIOVASCULAR: Normal heart rate, Normal rhythm, No murmurs, rubs, gallops PULMONARY/CHEST: Clear to auscultation, no rhonchi, wheezes, or rales. Symmetrical breath sounds. Non-tender. ABDOMINAL: Non-distended, soft, non-tender - no rebound or guarding.  BS normal. NEUROLOGIC: Cranial nerves are normal-no change or difference in sensation in her face. Non-focal, moving all four extremities, no gross motor deficits - patient's strength is 5 out of 5 in flexors and extensors of the upper and lower extremities. She says she has only a slight reduction in sensation from mid forearm on the left distally and from mid left thigh distally.  EXTREMITIES: No clubbing, cyanosis, or edema SKIN: Warm, Dry, No erythema, No rash  ED Course   Procedures (including critical care time)  Labs Reviewed - No data to display Ct Head Wo Contrast  01/08/2013   *RADIOLOGY REPORT*  Clinical Data:  Left-sided numbness for the past week; chronic headaches.  History of Sjogren's syndrome.  CT HEAD WITHOUT CONTRAST AND CT CERVICAL SPINE WITHOUT CONTRAST  Technique:  Multidetector CT imaging of the head and cervical spine was performed following the standard protocol without intravenous contrast.  Multiplanar CT image reconstructions of the cervical spine were also generated.  Comparison: CT of the head performed 06/02/2012, and cervical spine radiographs performed 08/18/2012  CT HEAD  Findings: There is no evidence of acute infarction, mass lesion, or intra- or extra-axial hemorrhage on CT.  The posterior fossa, including the cerebellum, brainstem and fourth ventricle, is within normal limits.  The third and lateral ventricles, and basal ganglia are unremarkable in  appearance.  The cerebral hemispheres are symmetric in appearance, with normal gray- white differentiation.  No mass effect or midline shift is seen.  There is no evidence of fracture; visualized osseous structures are unremarkable in appearance.  The visualized portions of the orbits are within normal limits.  The paranasal sinuses and mastoid air cells are well-aerated.  No significant soft tissue abnormalities are seen.  IMPRESSION: No evidence of traumatic intracranial injury or fracture.  CT CERVICAL SPINE  Findings: There is no evidence of fracture or subluxation. Vertebral bodies demonstrate normal  height and alignment. Intervertebral disc spaces are preserved.  Prevertebral soft tissues are within normal limits.  The visualized neural foramina are grossly unremarkable.  The thyroid gland is unremarkable in appearance.  The visualized lung apices are clear.  No significant soft tissue abnormalities are seen.  IMPRESSION: No evidence of fracture or subluxation along the cervical spine.   Original Report Authenticated By: Tonia Ghent, M.D.   Ct Cervical Spine Wo Contrast  01/08/2013   *RADIOLOGY REPORT*  Clinical Data:  Left-sided numbness for the past week; chronic headaches.  History of Sjogren's syndrome.  CT HEAD WITHOUT CONTRAST AND CT CERVICAL SPINE WITHOUT CONTRAST  Technique:  Multidetector CT imaging of the head and cervical spine was performed following the standard protocol without intravenous contrast.  Multiplanar CT image reconstructions of the cervical spine were also generated.  Comparison: CT of the head performed 06/02/2012, and cervical spine radiographs performed 08/18/2012  CT HEAD  Findings: There is no evidence of acute infarction, mass lesion, or intra- or extra-axial hemorrhage on CT.  The posterior fossa, including the cerebellum, brainstem and fourth ventricle, is within normal limits.  The third and lateral ventricles, and basal ganglia are unremarkable in appearance.  The  cerebral hemispheres are symmetric in appearance, with normal gray- white differentiation.  No mass effect or midline shift is seen.  There is no evidence of fracture; visualized osseous structures are unremarkable in appearance.  The visualized portions of the orbits are within normal limits.  The paranasal sinuses and mastoid air cells are well-aerated.  No significant soft tissue abnormalities are seen.  IMPRESSION: No evidence of traumatic intracranial injury or fracture.  CT CERVICAL SPINE  Findings: There is no evidence of fracture or subluxation. Vertebral bodies demonstrate normal height and alignment. Intervertebral disc spaces are preserved.  Prevertebral soft tissues are within normal limits.  The visualized neural foramina are grossly unremarkable.  The thyroid gland is unremarkable in appearance.  The visualized lung apices are clear.  No significant soft tissue abnormalities are seen.  IMPRESSION: No evidence of fracture or subluxation along the cervical spine.   Original Report Authenticated By: Tonia Ghent, M.D.   1. Numbness and tingling of left arm and leg   2. Chronic nasal congestion     MDM  SAUSHA RAYMOND is a 42 y.o. female sitting with numbness tingling to the left side including left arm primarily from mid forearm to the fingertips. She also notices it about mid thigh to distally. On sensory exam she says that the left side feels slightly different.  Patient's differences in sensation are not accounted for by any dermatome or neurotome, imaging of the head and neck are unremarkable.  On getting further history, patient says she has seen a neurologist for this in the past and did have a negative MRI in the past. Patient does have Sjogren's syndrome, she had an elevated ANA, think it is also conceivable that this could be an autoimmune problem or related to her history of anxiety, fibromyalgia.  Patient is well appearing, vital signs are stable and within normal limits, she is  afebrile. Do not think she's got a neurologic emergency at this time.  Do not think this is CVA or TIA, considered transverse myelitis, encephalitis - but think those are unlikely as well. She has equal strength bilaterally. She is in no apparent distress. Patient understands that she can return to emergency department should she have any new or worsening symptoms such as weakness. Patient will followup with her  rheumatologist to discuss further management of her numbness and tingling.      Jones Skene, MD 01/08/13 [phone]

## 2013-01-08 NOTE — ED Notes (Signed)
Pt reports left sided numbness on & off for the past week. Pt states it woke her tonight the reason for coming to the er. Pt has not seen her PCP.

## 2013-01-26 ENCOUNTER — Ambulatory Visit (INDEPENDENT_AMBULATORY_CARE_PROVIDER_SITE_OTHER): Payer: Managed Care, Other (non HMO) | Admitting: Otolaryngology

## 2013-01-26 DIAGNOSIS — J310 Chronic rhinitis: Secondary | ICD-10-CM

## 2013-01-26 DIAGNOSIS — J343 Hypertrophy of nasal turbinates: Secondary | ICD-10-CM

## 2013-01-26 DIAGNOSIS — H6980 Other specified disorders of Eustachian tube, unspecified ear: Secondary | ICD-10-CM

## 2013-02-18 ENCOUNTER — Emergency Department (HOSPITAL_COMMUNITY)
Admission: EM | Admit: 2013-02-18 | Discharge: 2013-02-18 | Disposition: A | Discharge status: Home / Self Care | Payer: Managed Care, Other (non HMO) | Attending: Emergency Medicine | Admitting: Emergency Medicine

## 2013-02-18 ENCOUNTER — Encounter (HOSPITAL_COMMUNITY): Payer: Self-pay

## 2013-02-18 DIAGNOSIS — Z8659 Personal history of other mental and behavioral disorders: Secondary | ICD-10-CM | POA: Insufficient documentation

## 2013-02-18 DIAGNOSIS — J45909 Unspecified asthma, uncomplicated: Secondary | ICD-10-CM | POA: Insufficient documentation

## 2013-02-18 DIAGNOSIS — Z8639 Personal history of other endocrine, nutritional and metabolic disease: Secondary | ICD-10-CM | POA: Insufficient documentation

## 2013-02-18 DIAGNOSIS — M542 Cervicalgia: Secondary | ICD-10-CM | POA: Insufficient documentation

## 2013-02-18 DIAGNOSIS — K219 Gastro-esophageal reflux disease without esophagitis: Secondary | ICD-10-CM | POA: Insufficient documentation

## 2013-02-18 DIAGNOSIS — Z8742 Personal history of other diseases of the female genital tract: Secondary | ICD-10-CM | POA: Insufficient documentation

## 2013-02-18 DIAGNOSIS — Z862 Personal history of diseases of the blood and blood-forming organs and certain disorders involving the immune mechanism: Secondary | ICD-10-CM | POA: Insufficient documentation

## 2013-02-18 DIAGNOSIS — G8929 Other chronic pain: Secondary | ICD-10-CM | POA: Insufficient documentation

## 2013-02-18 DIAGNOSIS — IMO0001 Reserved for inherently not codable concepts without codable children: Secondary | ICD-10-CM | POA: Insufficient documentation

## 2013-02-18 DIAGNOSIS — M549 Dorsalgia, unspecified: Secondary | ICD-10-CM | POA: Insufficient documentation

## 2013-02-18 DIAGNOSIS — Z8739 Personal history of other diseases of the musculoskeletal system and connective tissue: Secondary | ICD-10-CM | POA: Insufficient documentation

## 2013-02-18 DIAGNOSIS — J40 Bronchitis, not specified as acute or chronic: Secondary | ICD-10-CM

## 2013-02-18 DIAGNOSIS — Z79899 Other long term (current) drug therapy: Secondary | ICD-10-CM | POA: Insufficient documentation

## 2013-02-18 MED ORDER — GUAIFENESIN 100 MG/5ML PO SYRP: 100.0000 mg | ORAL_SOLUTION | ORAL | Status: DC | PRN

## 2013-02-18 MED ORDER — AZITHROMYCIN 250 MG PO TABS: 250.0000 mg | ORAL_TABLET | Freq: Every day | ORAL | Status: DC

## 2013-02-18 NOTE — ED Notes (Signed)
Pt is having nasal congestion, for 1 or 2. Has been taking claritin. Pt also reports back pain that radiates to her left arm. For 1 month, has been seen in the ed for this same problem and has not followed up with the specialist.

## 2013-02-18 NOTE — ED Provider Notes (Signed)
Medical screening examination/treatment/procedure(s) were performed by non-physician practitioner and as supervising physician I was immediately available for consultation/collaboration.   Lyanne Co, MD 02/18/13 [phone]

## 2013-02-18 NOTE — ED Provider Notes (Signed)
CSN: [id number]     Arrival date & time 02/18/13  9629 History   First MD Initiated Contact with Patient 02/18/13 0756     Chief Complaint  Patient presents with  . Nasal Congestion  . Back Pain   (Consider location/radiation/quality/duration/timing/severity/associated sxs/prior Treatment) Patient is a 42 y.o. female presenting with back pain. The history is provided by the patient.  Back Pain Location:  Generalized Quality:  Shooting and aching Radiates to:  L shoulder Pain severity now: 8/10. Pain is:  Same all the time Onset quality:  Gradual Duration:  1 month Timing:  Constant Progression:  Worsening Chronicity:  Chronic Context: not falling, not occupational injury, not physical stress and not recent injury   Relieved by:  Nothing Worsened by:  Movement, bending and twisting Ineffective treatments:  Ibuprofen Associated symptoms: no abdominal pain, no dysuria, no fever and no headaches    Yvette Mayer is a 42 y.o. female who presents to the ED with back pain. She was here less than one month ago and had complete evaluation with CT of head and neck that were normal.  She was referred to a Neurologist for further evaluation but has not gone. She has an appointment with her Rheumatologist 02/23/13. She states she is not here for treatment of her chronic pain today. She understands that her chronic pain needs to be further evaluated by the Neurologist.    She is here today for evaluation of her congestion and cough. She does not want more x-ray because she has had so many CT scans and x-rays in the past. She has been taking sinus medications without relief. She is coughing up yellow sputum.   Past Medical History  Diagnosis Date  . Anemia     iron 1 yr ago, normal hgb 7/12  . Asthma   . Sinus drainage   . Anxiety   . Hyperlipidemia   . Shortness of breath   . GERD (gastroesophageal reflux disease)   . Ovarian cyst, right   . Fibromyalgia   . Allergy   . TMJ  (dislocation of temporomandibular joint)   . Chronic sinusitis   . Vertigo   . Chronic headaches   . Sjoegren syndrome    Past Surgical History  Procedure Laterality Date  . Partial hysterectomy  93  . Cesarean section      x2  . Ovarian cyst removal  92 removal of cysts from behind ovaries  . Cholecystectomy  2005    cholelithiasis  . Tonsillectomy      2010  . Septoplasty      2011  . Esophagogastroduodenoscopy  01/23/2011    Procedure: ESOPHAGOGASTRODUODENOSCOPY (EGD);  Surgeon: Corbin Ade, MD;  Location: [facility];  Service: Endoscopy;  Laterality: N/A;  9:00am  . Maloney dilation  01/23/2011    Procedure: Elease Hashimoto DILATION;  Surgeon: Corbin Ade, MD;  Location: [facility];  Service: Endoscopy;  Laterality: N/A;  . Upper gastrointestinal endoscopy    . Abdominal hysterectomy    . Temporomandibular joint surgery     Family History  Problem Relation Age of Onset  . Diabetes Father   . Hypertension Mother   . Anesthesia problems Neg Hx   . Malignant hyperthermia Neg Hx   . Hypotension Neg Hx   . Pseudochol deficiency Neg Hx    History  Substance Use Topics  . Smoking status: Never Smoker   . Smokeless tobacco: Not on file  . Alcohol Use: No  OB History   Grav Para Term Preterm Abortions TAB SAB Ect Mult Living                 Review of Systems  Constitutional: Negative for fever and chills.  HENT: Positive for congestion, sneezing and neck pain (chronic). Negative for sore throat. Dental problem: TMJ.        TMJ  Eyes: Negative for visual disturbance.  Gastrointestinal: Negative for nausea, vomiting and abdominal pain.  Genitourinary: Negative for dysuria, urgency and frequency.  Musculoskeletal: Positive for back pain (chronic).  Skin: Negative for rash.  Neurological: Negative for dizziness and headaches.  Psychiatric/Behavioral: Nervous/anxious: hx anxiety.     Allergies  Hydrocodone-acetaminophen and Percocet  Home Medications   Current  Outpatient Rx  Name  Route  Sig  Dispense  Refill  . diphenhydrAMINE (BENADRYL) 25 MG tablet      Take one Benadryl if you take a Percocet to prevent itching.   20 tablet   0   . ibuprofen (ADVIL,MOTRIN) 200 MG tablet   Oral   Take 400 mg by mouth every 6 (six) hours as needed for pain.         Marland Kitchen loratadine-pseudoephedrine (CLARITIN-D 24 HOUR) 10-240 MG per 24 hr tablet   Oral   Take 1 tablet by mouth daily.   30 tablet   0   . omeprazole (PRILOSEC) 20 MG capsule   Oral   Take 20 mg by mouth daily.         Marland Kitchen oxyCODONE-acetaminophen (PERCOCET/ROXICET) 5-325 MG per tablet   Oral   Take 1 tablet by mouth every 4 (four) hours as needed for pain.   20 tablet   0   . polyethylene glycol powder (GLYCOLAX/MIRALAX) powder   Oral   Take 17 g by mouth daily.   255 g   0    BP 121/61  Pulse 76  Temp(Src) 97.8 F (36.6 C) (Oral)  Resp 18  SpO2 99% Physical Exam  Nursing note and vitals reviewed. Constitutional: She is oriented to person, place, and time. She appears well-developed and well-nourished. No distress.  HENT:  Head: Normocephalic and atraumatic.  Right Ear: Tympanic membrane normal.  Left Ear: Tympanic membrane normal.  Nose: Right sinus exhibits maxillary sinus tenderness. Left sinus exhibits maxillary sinus tenderness.  Mouth/Throat: Uvula is midline, oropharynx is clear and moist and mucous membranes are normal.  Eyes: Conjunctivae and EOM are normal. Pupils are equal, round, and reactive to light.  Neck: Neck supple.  Cardiovascular: Normal rate, regular rhythm and normal heart sounds.   Pulmonary/Chest: Effort normal. She has no wheezes. She has no rales.  Abdominal: Soft. There is no tenderness.  Musculoskeletal: Normal range of motion.  Neurological: She is alert and oriented to person, place, and time. She has normal strength. No cranial nerve deficit. Gait normal.  Skin: Skin is warm and dry.  Psychiatric: She has a normal mood and affect. Her  behavior is normal.    ED Course  Procedures  MDM  42 y.o. female with cough and congestion and chronic back pain. She has follow up for her back problems but wants treatment for her cough and congestion.  Discussed with the patient clinical findings and plan of care and all questioned fully answered. She will follow up as scheduled. She will return here for any worsening symptoms. Patient with stable vital signs, O2 Sat 99% on R/A. She is stable for discharge without any immediate complications.  Will treat for Bronchitis.  Medication List    TAKE these medications       azithromycin 250 MG tablet  Commonly known as:  ZITHROMAX  Take 1 tablet (250 mg total) by mouth daily. Take first 2 tablets together, then 1 every day until finished.     guaifenesin 100 MG/5ML syrup  Commonly known as:  ROBITUSSIN  Take 5-10 mLs (100-200 mg total) by mouth every 4 (four) hours as needed for cough.      ASK your doctor about these medications       diphenhydrAMINE 25 MG tablet  Commonly known as:  BENADRYL  Take one Benadryl if you take a Percocet to prevent itching.     ibuprofen 200 MG tablet  Commonly known as:  ADVIL,MOTRIN  Take 400 mg by mouth every 6 (six) hours as needed for pain.     loratadine-pseudoephedrine 10-240 MG per 24 hr tablet  Commonly known as:  CLARITIN-D 24 HOUR  Take 1 tablet by mouth daily.     omeprazole 20 MG capsule  Commonly known as:  PRILOSEC  Take 20 mg by mouth daily.     oxyCODONE-acetaminophen 5-325 MG per tablet  Commonly known as:  PERCOCET/ROXICET  Take 1 tablet by mouth every 4 (four) hours as needed for pain.     polyethylene glycol powder powder  Commonly known as:  GLYCOLAX/MIRALAX  Take 17 g by mouth daily.           [address] [place], [place] 02/18/13 [phone]

## 2013-03-02 ENCOUNTER — Ambulatory Visit (INDEPENDENT_AMBULATORY_CARE_PROVIDER_SITE_OTHER): Payer: Managed Care, Other (non HMO) | Admitting: Otolaryngology

## 2013-03-02 DIAGNOSIS — J310 Chronic rhinitis: Secondary | ICD-10-CM

## 2013-03-02 DIAGNOSIS — J343 Hypertrophy of nasal turbinates: Secondary | ICD-10-CM

## 2013-05-08 ENCOUNTER — Encounter: Payer: Self-pay | Admitting: Family Medicine

## 2013-05-08 ENCOUNTER — Ambulatory Visit (INDEPENDENT_AMBULATORY_CARE_PROVIDER_SITE_OTHER): Payer: Managed Care, Other (non HMO) | Admitting: Family Medicine

## 2013-05-08 VITALS — BP 90/60 | HR 82 | Temp 97.8°F | Resp 18 | Ht 63.5 in | Wt 164.0 lb

## 2013-05-08 DIAGNOSIS — N760 Acute vaginitis: Secondary | ICD-10-CM

## 2013-05-08 DIAGNOSIS — R21 Rash and other nonspecific skin eruption: Secondary | ICD-10-CM

## 2013-05-08 DIAGNOSIS — A499 Bacterial infection, unspecified: Secondary | ICD-10-CM

## 2013-05-08 DIAGNOSIS — R350 Frequency of micturition: Secondary | ICD-10-CM

## 2013-05-08 DIAGNOSIS — E785 Hyperlipidemia, unspecified: Secondary | ICD-10-CM

## 2013-05-08 DIAGNOSIS — N898 Other specified noninflammatory disorders of vagina: Secondary | ICD-10-CM

## 2013-05-08 DIAGNOSIS — B9689 Other specified bacterial agents as the cause of diseases classified elsewhere: Secondary | ICD-10-CM

## 2013-05-08 LAB — WET PREP FOR TRICH, YEAST, CLUE: Trich, Wet Prep: NONE SEEN

## 2013-05-08 LAB — URINALYSIS, ROUTINE W REFLEX MICROSCOPIC
Bilirubin Urine: NEGATIVE
Glucose, UA: NEGATIVE mg/dL
Hgb urine dipstick: NEGATIVE
Ketones, ur: NEGATIVE mg/dL
pH: 7 (ref 5.0–8.0)

## 2013-05-08 MED ORDER — METRONIDAZOLE 500 MG PO TABS: 500.0000 mg | ORAL_TABLET | Freq: Two times a day (BID) | ORAL | Status: DC

## 2013-05-08 MED ORDER — FLUCONAZOLE 150 MG PO TABS: 150.0000 mg | ORAL_TABLET | Freq: Every day | ORAL | Status: DC

## 2013-05-08 NOTE — Assessment & Plan Note (Signed)
Her repeat cholesterol panel done at her job recently showed elevation in her LDL to 141 total cholesterol was 224, this is worse than her previous check about 6-7 months ago. Will reiterate dietary changes. We will hold on statin drug at this time

## 2013-05-08 NOTE — Assessment & Plan Note (Signed)
Would treat with Flagyl also given Diflucan and she will be on a multiple antibiotics and will likely have yeast infection

## 2013-05-08 NOTE — Progress Notes (Signed)
   Subjective:    [name], female    DOB: 06-08-1970, 42 y.o.   MRN: [id number]  HPI Patient here with a few concerns. Over the past month she has had a red rash over her face with some mild swelling and itching. She's been seen by dermatology 3 times. Initially thought to be an allergic reaction she was given a topical cortisone of some sort. She then returned the call her with rosacea she was given MetroGel and doxycycline however she did not start this yet. The MetroGel cause more reaction of her skin. She does remember that she was using multiple face washes as well as baby oil and some other topical meds that her husband had because she had dryness from her Sjorgen's syndrome  She also complains of a swollen gland in her right groin as well as vaginal discharge and urinary frequency.  She also notes that she has recurrence of some tiny cyst in her scalp which she's had lanced before  He recently had fasting labs done at work which she will forward me a copy.   Review of Systems - per above   GEN- denies fatigue, fever, weight loss,weakness, recent illness HEENT- denies eye drainage, change in vision, nasal discharge, CVS- denies chest pain, palpitations RESP- denies SOB, cough, wheeze ABD- denies N/V, change in stools, abd pain GU- denies dysuria, hematuria, dribbling, incontinence MSK- denies joint pain, muscle aches, injury Neuro- denies headache, dizziness, syncope, seizure activity      Objective:   Physical Exam  GEN-NAD,alert and oriented x 3 HEENT- PERRL, EOMI, MMM, oropharynx clear  ABD-NABS,soft,NT,ND GU- normal external genitalia, vaginal mucosa pink and moist, no cervix visualized  no ovarian masses,+white discharge Lymph- small tender node RIght inguinal region Ext- No edema Skin- erythema, with fine erythematous maculopapular rash on forehead, blilat cheeks and chin, skin blanches, no vesicles  3 small sebaceous cyst in scalp         Assessment & Plan:

## 2013-05-08 NOTE — Assessment & Plan Note (Signed)
I will obtain dermatology notes. I will defer to them and she is have followup with him regarding her facial rash. I've advised her not to use any the other cleansers and oils on her skin. I recommend Cetophil cleanser

## 2013-05-08 NOTE — Patient Instructions (Signed)
Please fax a copy of labs to [phone] Take flagyl for BV Diflucan also sent Start the doxycyline Use Cetophil for face Release of records  [facility]  F/U as needed

## 2013-05-08 NOTE — Assessment & Plan Note (Signed)
No sign of UTI. Her blood glucose was also normal.

## 2013-05-19 ENCOUNTER — Encounter (HOSPITAL_BASED_OUTPATIENT_CLINIC_OR_DEPARTMENT_OTHER): Payer: Self-pay | Admitting: *Deleted

## 2013-05-19 NOTE — Progress Notes (Signed)
No labs needed

## 2013-05-29 ENCOUNTER — Encounter (HOSPITAL_BASED_OUTPATIENT_CLINIC_OR_DEPARTMENT_OTHER)
Admission: RE | Disposition: A | Discharge status: Home / Self Care | Payer: Self-pay | Source: Ambulatory Visit | Attending: Otolaryngology

## 2013-05-29 ENCOUNTER — Encounter (HOSPITAL_BASED_OUTPATIENT_CLINIC_OR_DEPARTMENT_OTHER): Payer: Self-pay | Admitting: *Deleted

## 2013-05-29 ENCOUNTER — Ambulatory Visit (HOSPITAL_BASED_OUTPATIENT_CLINIC_OR_DEPARTMENT_OTHER): Payer: Managed Care, Other (non HMO) | Admitting: Anesthesiology

## 2013-05-29 ENCOUNTER — Ambulatory Visit (HOSPITAL_BASED_OUTPATIENT_CLINIC_OR_DEPARTMENT_OTHER)
Admission: RE | Admit: 2013-05-29 | Discharge: 2013-05-29 | Disposition: A | Discharge status: Home / Self Care | Payer: Managed Care, Other (non HMO) | Source: Ambulatory Visit | Attending: Otolaryngology | Admitting: Otolaryngology

## 2013-05-29 ENCOUNTER — Encounter (HOSPITAL_BASED_OUTPATIENT_CLINIC_OR_DEPARTMENT_OTHER): Payer: Managed Care, Other (non HMO) | Admitting: Anesthesiology

## 2013-05-29 DIAGNOSIS — J45909 Unspecified asthma, uncomplicated: Secondary | ICD-10-CM | POA: Insufficient documentation

## 2013-05-29 DIAGNOSIS — J3489 Other specified disorders of nose and nasal sinuses: Secondary | ICD-10-CM | POA: Insufficient documentation

## 2013-05-29 DIAGNOSIS — K219 Gastro-esophageal reflux disease without esophagitis: Secondary | ICD-10-CM | POA: Insufficient documentation

## 2013-05-29 DIAGNOSIS — IMO0001 Reserved for inherently not codable concepts without codable children: Secondary | ICD-10-CM | POA: Insufficient documentation

## 2013-05-29 DIAGNOSIS — J343 Hypertrophy of nasal turbinates: Secondary | ICD-10-CM

## 2013-05-29 HISTORY — PX: TURBINATE RESECTION: SHX6158

## 2013-05-29 LAB — POCT HEMOGLOBIN-HEMACUE: Hemoglobin: 13.4 g/dL (ref 12.0–15.0)

## 2013-05-29 SURGERY — TURBINATE RESECTION
Anesthesia: General | Site: Nose | Laterality: Bilateral

## 2013-05-29 MED ORDER — FENTANYL CITRATE 0.05 MG/ML IJ SOLN: 50.0000 ug | INTRAMUSCULAR | Status: DC | PRN

## 2013-05-29 MED ORDER — MIDAZOLAM HCL 2 MG/2ML IJ SOLN
INTRAMUSCULAR | Status: AC
  Filled 2013-05-29: qty 2

## 2013-05-29 MED ORDER — MIDAZOLAM HCL 2 MG/2ML IJ SOLN: 1.0000 mg | INTRAMUSCULAR | Status: DC | PRN

## 2013-05-29 MED ORDER — FENTANYL CITRATE 0.05 MG/ML IJ SOLN
INTRAMUSCULAR | Status: AC
  Filled 2013-05-29: qty 6

## 2013-05-29 MED ORDER — AMOXICILLIN 875 MG PO TABS: 875.0000 mg | ORAL_TABLET | Freq: Two times a day (BID) | ORAL | Status: DC

## 2013-05-29 MED ORDER — OXYCODONE HCL 5 MG PO TABS: 5.0000 mg | ORAL_TABLET | Freq: Once | ORAL | Status: DC | PRN

## 2013-05-29 MED ORDER — LIDOCAINE HCL (CARDIAC) 20 MG/ML IV SOLN
INTRAVENOUS | Status: DC | PRN
  Administered 2013-05-29: 80 mg via INTRAVENOUS

## 2013-05-29 MED ORDER — OXYCODONE-ACETAMINOPHEN 5-325 MG PO TABS: 1.0000 | ORAL_TABLET | ORAL | Status: DC | PRN

## 2013-05-29 MED ORDER — HYDROMORPHONE HCL PF 1 MG/ML IJ SOLN
INTRAMUSCULAR | Status: AC
  Filled 2013-05-29: qty 1

## 2013-05-29 MED ORDER — LACTATED RINGERS IV SOLN
INTRAVENOUS | Status: DC
  Administered 2013-05-29: 08:00:00 via INTRAVENOUS

## 2013-05-29 MED ORDER — FENTANYL CITRATE 0.05 MG/ML IJ SOLN
INTRAMUSCULAR | Status: DC | PRN
  Administered 2013-05-29: 100 ug via INTRAVENOUS

## 2013-05-29 MED ORDER — HYDROMORPHONE HCL PF 1 MG/ML IJ SOLN
0.2500 mg | INTRAMUSCULAR | Status: DC | PRN
  Administered 2013-05-29 (×2): 0.5 mg via INTRAVENOUS

## 2013-05-29 MED ORDER — PROMETHAZINE HCL 25 MG/ML IJ SOLN: 6.2500 mg | INTRAMUSCULAR | Status: DC | PRN

## 2013-05-29 MED ORDER — SUCCINYLCHOLINE CHLORIDE 20 MG/ML IJ SOLN
INTRAMUSCULAR | Status: DC | PRN
  Administered 2013-05-29: 100 mg via INTRAVENOUS

## 2013-05-29 MED ORDER — PROPOFOL 10 MG/ML IV BOLUS
INTRAVENOUS | Status: DC | PRN
  Administered 2013-05-29: 150 mg via INTRAVENOUS

## 2013-05-29 MED ORDER — ONDANSETRON HCL 4 MG/2ML IJ SOLN
INTRAMUSCULAR | Status: DC | PRN
  Administered 2013-05-29: 4 mg via INTRAVENOUS

## 2013-05-29 MED ORDER — DEXAMETHASONE SODIUM PHOSPHATE 4 MG/ML IJ SOLN
INTRAMUSCULAR | Status: DC | PRN
  Administered 2013-05-29: 10 mg via INTRAVENOUS

## 2013-05-29 MED ORDER — MIDAZOLAM HCL 5 MG/5ML IJ SOLN
INTRAMUSCULAR | Status: DC | PRN
  Administered 2013-05-29: 2 mg via INTRAVENOUS

## 2013-05-29 MED ORDER — OXYCODONE HCL 5 MG/5ML PO SOLN: 5.0000 mg | Freq: Once | ORAL | Status: DC | PRN

## 2013-05-29 MED ORDER — OXYMETAZOLINE HCL 0.05 % NA SOLN
NASAL | Status: DC | PRN
  Administered 2013-05-29: 1 via NASAL

## 2013-05-29 SURGICAL SUPPLY — 35 items
BLADE SURG 15 STRL LF DISP TIS (BLADE) IMPLANT
BLADE SURG 15 STRL SS (BLADE)
CANISTER SUCT 1200ML W/VALVE (MISCELLANEOUS) ×2 IMPLANT
COAGULATOR SUCT 8FR VV (MISCELLANEOUS) ×2 IMPLANT
COVER MAYO STAND STRL (DRAPES) ×2 IMPLANT
DECANTER SPIKE VIAL GLASS SM (MISCELLANEOUS) IMPLANT
DRSG NASOPORE 8CM (GAUZE/BANDAGES/DRESSINGS) IMPLANT
DRSG TELFA 3X8 NADH (GAUZE/BANDAGES/DRESSINGS) IMPLANT
ELECT REM PT RETURN 9FT ADLT (ELECTROSURGICAL) ×2
ELECTRODE REM PT RTRN 9FT ADLT (ELECTROSURGICAL) ×1 IMPLANT
GLOVE BIO SURGEON STRL SZ7.5 (GLOVE) ×2 IMPLANT
GLOVE ECLIPSE 6.5 STRL STRAW (GLOVE) ×2 IMPLANT
GLOVE SURG SS PI 7.0 STRL IVOR (GLOVE) ×2 IMPLANT
GOWN PREVENTION PLUS XLARGE (GOWN DISPOSABLE) ×6 IMPLANT
NEEDLE HYPO 25X1 1.5 SAFETY (NEEDLE) ×2 IMPLANT
NS IRRIG 1000ML POUR BTL (IV SOLUTION) IMPLANT
PACK BASIN DAY SURGERY FS (CUSTOM PROCEDURE TRAY) ×2 IMPLANT
PACK ENT DAY SURGERY (CUSTOM PROCEDURE TRAY) ×2 IMPLANT
SHEET MEDIUM DRAPE 40X70 STRL (DRAPES) ×2 IMPLANT
SLEEVE SCD COMPRESS KNEE MED (MISCELLANEOUS) IMPLANT
SOLUTION BUTLER CLEAR DIP (MISCELLANEOUS) ×2 IMPLANT
SPLINT NASAL DOYLE BI-VL (GAUZE/BANDAGES/DRESSINGS) IMPLANT
SPONGE GAUZE 2X2 8PLY STRL LF (GAUZE/BANDAGES/DRESSINGS) ×2 IMPLANT
SPONGE NEURO XRAY DETECT 1X3 (DISPOSABLE) ×2 IMPLANT
SUT CHROMIC 4 0 P 3 18 (SUTURE) ×2 IMPLANT
SUT PLAIN 4 0 ~~LOC~~ 1 (SUTURE) IMPLANT
SUT PROLENE 3 0 PS 2 (SUTURE) IMPLANT
SUT VIC AB 4-0 P-3 18XBRD (SUTURE) IMPLANT
SUT VIC AB 4-0 P3 18 (SUTURE)
SYR CONTROL 10ML LL (SYRINGE) IMPLANT
TOWEL OR 17X24 6PK STRL BLUE (TOWEL DISPOSABLE) ×2 IMPLANT
TUBE CONNECTING 20X1/4 (TUBING) ×2 IMPLANT
TUBE SALEM SUMP 12R W/ARV (TUBING) IMPLANT
TUBE SALEM SUMP 16 FR W/ARV (TUBING) ×2 IMPLANT
YANKAUER SUCT BULB TIP NO VENT (SUCTIONS) ×2 IMPLANT

## 2013-05-29 NOTE — Anesthesia Preprocedure Evaluation (Addendum)
Anesthesia Evaluation   Patient awake    History of Anesthesia Complications Negative for: history of anesthetic complications  Airway       Dental  (+) Teeth Intact   Pulmonary shortness of breath, asthma ,  breath sounds clear to auscultation        Cardiovascular negative cardio ROS  Rhythm:Regular Rate:Normal     Neuro/Psych  Headaches,  Neuromuscular disease    GI/Hepatic GERD-  ,  Endo/Other    Renal/GU      Musculoskeletal  (+) Fibromyalgia -  Abdominal   Peds  Hematology   Anesthesia Other Findings   Reproductive/Obstetrics                           Anesthesia Physical Anesthesia Plan  ASA: II  Anesthesia Plan: General   Post-op Pain Management:    Induction: Intravenous  Airway Management Planned: Oral ETT  Additional Equipment:   Intra-op Plan:   Post-operative Plan: Extubation in OR  Informed Consent: I have reviewed the patients History and Physical, chart, labs and discussed the procedure including the risks, benefits and alternatives for the proposed anesthesia with the patient or authorized representative who has indicated his/her understanding and acceptance.   Dental advisory given  Plan Discussed with:   Anesthesia Plan Comments:        Anesthesia Quick Evaluation

## 2013-05-29 NOTE — Op Note (Signed)
DATE OF PROCEDURE: 05/29/2013  OPERATIVE REPORT   SURGEON: Newman Pies, MD   PREOPERATIVE DIAGNOSES:  1. Chronic nasal obstruction.  2. Bilateral inferior turbinate hypertrophy.   POSTOPERATIVE DIAGNOSES:  1. Chronic nasal obstruction.  2. Bilateral inferior turbinate hypertrophy.   PROCEDURE PERFORMED: Bilateral partial inferior turbinate resection.   ANESTHESIA: General endotracheal tube anesthesia.   COMPLICATIONS: None.   ESTIMATED BLOOD LOSS: Minimal.   INDICATION FOR PROCEDURE :Yvette Mayer is a 42 y.o. female with a history of chronic nasal obstruction. The patient was  treated with antihistamine, decongestant, steroid nasal spray, and systemic steroids. However, the patient continues to be symptomatic. On examination, the patient was noted to have bilateral severe inferior turbinate hypertrophy, causing significant nasal obstruction. Based on the above findings, the decision was made for the patient to undergo the above-stated procedure. The risks, benefits, alternatives, and details of the procedure were discussed with the patient. Questions were invited and answered. Informed consent was obtained.   DESCRIPTION: The patient was taken to the operating room and placed supine on the operating table. General endotracheal tube anesthesia was administered by the anesthesiologist. The patient was positioned and prepped and draped in a standard fashion for nasal surgery. Pledgets soaked with Afrin were placed in both nasal cavities. The pledgets were subsequently removed. Examination of the nasal cavities revealed bilateral severe inferior turbinate hypertrophy. The inferior one-half of each inferior turbinate was then crossclamped with a straight Kelly clamp. The inferior one-half of each inferior turbinate was then resected with a pair of cross cutting scissors. Hemostasis was achieved with suction electrocautery, under direct visual guidance of the zero-degree endoscope. Good hemostasis  was achieved. The care of the patient was turned over to the anesthesiologist. The patient was awakened from anesthesia without difficulty. The patient was extubated and transferred to the recovery room in good condition.   OPERATIVE FINDINGS: Bilateral inferior turbinate hypertrophy.   SPECIMEN: None.   FOLLOWUP CARE: The patient will be discharged home once she is awake and alert. The patient will be placed on percocet 1-2 tablets p.o. q.6 h. p.r.n. pain, and amoxicillin 875 mg p.o. b.i.d. for 5 days. The patient will follow up in my office in approximately 1 week.   Newman Pies, MD

## 2013-05-29 NOTE — Transfer of Care (Signed)
Immediate Anesthesia Transfer of Care Note  Patient: Yvette Mayer  Procedure(s) Performed: Procedure(s): TURBINATE RESECTION (Bilateral)  Patient Location: PACU  Anesthesia Type:General  Level of Consciousness: awake and alert   Airway & Oxygen Therapy: Patient Spontanous Breathing and Patient connected to face mask oxygen  Post-op Assessment: Report given to PACU RN and Post -op Vital signs reviewed and stable  Post vital signs: Reviewed and stable  Complications: No apparent anesthesia complications

## 2013-05-29 NOTE — Anesthesia Postprocedure Evaluation (Signed)
  Anesthesia Post-op Note  Patient: Yvette Mayer  Procedure(s) Performed: Procedure(s): TURBINATE RESECTION (Bilateral)  Patient Location: PACU  Anesthesia Type:General  Level of Consciousness: awake and alert   Airway and Oxygen Therapy: Patient Spontanous Breathing  Post-op Pain: mild  Post-op Assessment: Post-op Vital signs reviewed  Post-op Vital Signs: stable  Complications: No apparent anesthesia complications

## 2013-05-29 NOTE — Anesthesia Procedure Notes (Signed)
Procedure Name: Intubation Date/Time: 05/29/2013 11:54 AM Performed by: Burna Cash Pre-anesthesia Checklist: Patient identified, Emergency Drugs available, Suction available and Patient being monitored Patient Re-evaluated:Patient Re-evaluated prior to inductionOxygen Delivery Method: Circle System Utilized Preoxygenation: Pre-oxygenation with 100% oxygen Intubation Type: IV induction Ventilation: Mask ventilation without difficulty Laryngoscope Size: Miller and 2 Tube type: Oral Number of attempts: 1 Airway Equipment and Method: stylet and oral airway Placement Confirmation: ETT inserted through vocal cords under direct vision,  positive ETCO2 and breath sounds checked- equal and bilateral Secured at: 21 cm Tube secured with: Tape Dental Injury: Teeth and Oropharynx as per pre-operative assessment

## 2013-05-29 NOTE — H&P (Signed)
Cc: Chronic nasal obstruction  HPI: The patient is a 42 y/o female who returns today complaining of persistent nasal congestion.  The patient was last seen on 01/26/2013.  At that time, she was noted to have significant nasal mucosal congestion with inferior turbinate hypertrophy.  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  The patient was treated with a prednisone dose pak and placed on daily Flonase. The patient has a history of previous right maxillary antrostomy by Dr. Andrey Campanile in 2010.  According to the patient, she has not noted any improvement in her symptoms.  She notes persistent nasal congestion which is causing facial pain and pressure.  The patient did have a head CT last year which did not show any sinus disease.   No other ENT, GI, or respiratory issue noted since the last visit.  Exam: General: Communicates without difficulty, well nourished, no acute distress.   Head: Normocephalic, no evidence injury, no tenderness, facial buttresses intact without stepoff.   Eyes: PERRL, EOMI.   No scleral icterus, conjunctivae clear.   Neuro: CN II exam reveals vision grossly intact.   No nystagmus at any point of gaze.   Ears: Auricles well formed without lesions.   Ear canals are intact without mass or lesion.   No erythema or edema is appreciated.   The TMs are intact without fluid.   Nose: External evaluation reveals normal support and skin without lesions.   Dorsum is intact.   Anterior rhinoscopy reveals severely congested and edematous mucosa over anterior aspect of the inferior turbinates and nasal septum.   No purulence is noted.   Oral:  Oral cavity and oropharynx are intact, symmetric, without erythema or edema.   Mucosa is moist without lesions.   Neck: Full range of motion without pain.   There is no significant lymphadenopathy.   No masses palpable.   Thyroid bed within normal limits to palpation.   Parotid glands and submandibular glands  equal bilaterally without mass.   Trachea is midline.   Neuro:  CN 2-12 grossly intact.   Gait normal.   Vestibular: No nystagmus at any point of gaze.  A: 1.   The patient is noted to have persistent, significant nasal mucosal congestion with inferior turbinate hypertrophy.   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   2.   The patient previously had a right maxillary antrostomy.  P: 1.   The physical exam findings are discussed with the patient.   2.   She will likely benefit from undergoing bilateral turbinate reduction to improve her nasal airways.. The risks, benefits, and details of the surgery are discussed. 3.  The patient would like to proceed with the procedure. We will schedule the procedure in accordance with the patient's schedule.

## 2013-05-30 ENCOUNTER — Encounter (HOSPITAL_BASED_OUTPATIENT_CLINIC_OR_DEPARTMENT_OTHER): Payer: Self-pay | Admitting: Otolaryngology

## 2013-06-08 ENCOUNTER — Ambulatory Visit (INDEPENDENT_AMBULATORY_CARE_PROVIDER_SITE_OTHER): Payer: Managed Care, Other (non HMO) | Admitting: Otolaryngology

## 2013-06-13 ENCOUNTER — Ambulatory Visit: Payer: Self-pay | Admitting: Family Medicine

## 2013-06-27 ENCOUNTER — Ambulatory Visit: Payer: Managed Care, Other (non HMO) | Admitting: Family Medicine

## 2013-06-29 ENCOUNTER — Ambulatory Visit (INDEPENDENT_AMBULATORY_CARE_PROVIDER_SITE_OTHER): Payer: Managed Care, Other (non HMO) | Admitting: Otolaryngology

## 2013-06-30 ENCOUNTER — Emergency Department (HOSPITAL_COMMUNITY): Payer: Managed Care, Other (non HMO)

## 2013-06-30 ENCOUNTER — Emergency Department (HOSPITAL_COMMUNITY)
Admission: EM | Admit: 2013-06-30 | Discharge: 2013-06-30 | Disposition: A | Discharge status: Home / Self Care | Payer: Managed Care, Other (non HMO) | Attending: Emergency Medicine | Admitting: Emergency Medicine

## 2013-06-30 ENCOUNTER — Encounter (HOSPITAL_COMMUNITY): Payer: Self-pay | Admitting: Emergency Medicine

## 2013-06-30 DIAGNOSIS — Z8639 Personal history of other endocrine, nutritional and metabolic disease: Secondary | ICD-10-CM | POA: Insufficient documentation

## 2013-06-30 DIAGNOSIS — M25559 Pain in unspecified hip: Secondary | ICD-10-CM | POA: Insufficient documentation

## 2013-06-30 DIAGNOSIS — Z8742 Personal history of other diseases of the female genital tract: Secondary | ICD-10-CM | POA: Insufficient documentation

## 2013-06-30 DIAGNOSIS — M79606 Pain in leg, unspecified: Secondary | ICD-10-CM

## 2013-06-30 DIAGNOSIS — J45909 Unspecified asthma, uncomplicated: Secondary | ICD-10-CM | POA: Insufficient documentation

## 2013-06-30 DIAGNOSIS — Z862 Personal history of diseases of the blood and blood-forming organs and certain disorders involving the immune mechanism: Secondary | ICD-10-CM | POA: Insufficient documentation

## 2013-06-30 DIAGNOSIS — Z8719 Personal history of other diseases of the digestive system: Secondary | ICD-10-CM | POA: Insufficient documentation

## 2013-06-30 DIAGNOSIS — IMO0001 Reserved for inherently not codable concepts without codable children: Secondary | ICD-10-CM | POA: Insufficient documentation

## 2013-06-30 DIAGNOSIS — Z87828 Personal history of other (healed) physical injury and trauma: Secondary | ICD-10-CM | POA: Insufficient documentation

## 2013-06-30 DIAGNOSIS — G8929 Other chronic pain: Secondary | ICD-10-CM | POA: Insufficient documentation

## 2013-06-30 HISTORY — DX: Paresthesia of skin: R20.2

## 2013-06-30 HISTORY — DX: Cervicalgia: M54.2

## 2013-06-30 HISTORY — DX: Other chronic pain: G89.29

## 2013-06-30 HISTORY — DX: Dorsalgia, unspecified: M54.9

## 2013-06-30 MED ORDER — NAPROXEN 500 MG PO TABS: 500.0000 mg | ORAL_TABLET | Freq: Two times a day (BID) | ORAL | Status: DC

## 2013-06-30 NOTE — ED Notes (Signed)
Patient c/o left upper leg/thigh pain. Patient reports pain as cramping and aching. Denies any known injury. Patient denies taking any medications for pain.

## 2013-06-30 NOTE — Discharge Instructions (Signed)
Follow up tomorrow for ultrasound of leg.    °

## 2013-06-30 NOTE — ED Provider Notes (Signed)
CSN: [id number]     Arrival date & time 06/30/13  1640 History  This chart was scribed for Yvette Diego, MD by Einar Pheasant, ED Scribe. This patient was seen in room APA08/AP[facility]08 and the patient's care was started at 5:00 PM.    Chief Complaint  Patient presents with  . Leg Pain    Patient is a 43 y.o. female presenting with leg pain.  Leg Pain Location:  Leg Time since incident:  1 week Injury: no   Leg location:  L leg Pain details:    Radiates to:  Does not radiate   Severity:  Mild   Onset quality:  Gradual   Timing:  Constant Chronicity:  New Dislocation: no   Relieved by:  Nothing Worsened by:  Nothing tried Ineffective treatments:  None tried  HPI Comments: Yvette Mayer is a 43 y.o. female who presents to the Emergency Department complaining of left thigh pain that started 1 week ago. Pt states that the pain in her leg is similar to a previous episode she experienced 1 year ago. She reported the pain to her PCP, who told her that the pain could be due to a possible blood clot. However, nothing was found. She denies any recent injury. She states that her current job requires her to stand on her feet for long hours.  Past Medical History  Diagnosis Date  . Anemia     iron 1 yr ago, normal hgb 7/12  . Sinus drainage   . Anxiety   . Hyperlipidemia   . Shortness of breath   . GERD (gastroesophageal reflux disease)   . Ovarian cyst, right   . Fibromyalgia   . Allergy   . TMJ (dislocation of temporomandibular joint)   . Chronic sinusitis   . Vertigo   . Chronic headaches   . Sjoegren syndrome   . Asthma     pt says no-bronchitis  . Chronic neck pain   . Chronic back pain   . Paresthesias    Past Surgical History  Procedure Laterality Date  . Partial hysterectomy  93  . Cesarean section      x2  . Ovarian cyst removal  92 removal of cysts from behind ovaries  . Cholecystectomy  2005    cholelithiasis  . Tonsillectomy      2010  . Septoplasty     2011  . Esophagogastroduodenoscopy  01/23/2011    Procedure: ESOPHAGOGASTRODUODENOSCOPY (EGD);  Surgeon: Daneil Dolin, MD;  [facility];  Service: Endoscopy;  Laterality: N/A;  9:00am  . Maloney dilation  01/23/2011    Procedure: Venia Minks DILATION;  Surgeon: Daneil Dolin, MD;  Location: [facility];  Service: Endoscopy;  Laterality: N/A;  . Upper gastrointestinal endoscopy    . Abdominal hysterectomy    . Temporomandibular joint surgery    . Turbinate resection Bilateral 05/29/2013    Procedure: TURBINATE RESECTION;  Surgeon: Ascencion Dike, MD;  Location: [place];  Service: ENT;  Laterality: Bilateral;   Family History  Problem Relation Age of Onset  . Diabetes Father   . Hypertension Mother   . Anesthesia problems Neg Hx   . Malignant hyperthermia Neg Hx   . Hypotension Neg Hx   . Pseudochol deficiency Neg Hx    History  Substance Use Topics  . Smoking status: Never Smoker   . Smokeless tobacco: Never Used  . Alcohol Use: No   OB History   Grav Para Term  Preterm Abortions TAB SAB Ect Mult Living   4 3 2 1 1  1   3      Review of Systems  Musculoskeletal: Positive for arthralgias (left thigh pain).  All other systems reviewed and are negative.    Allergies  Hydrocodone-acetaminophen and Percocet  Home Medications   Current Outpatient Rx  Name  Route  Sig  Dispense  Refill  . amoxicillin (AMOXIL) 875 MG tablet   Oral   Take 1 tablet (875 mg total) by mouth 2 (two) times daily.   10 tablet   0   . doxycycline (VIBRA-TABS) 100 MG tablet   Oral   Take 100 mg by mouth 2 (two) times daily.         Marland Kitchen oxyCODONE-acetaminophen (ROXICET) 5-325 MG per tablet   Oral   Take 1 tablet by mouth every 4 (four) hours as needed for moderate pain or severe pain.   25 tablet   0   . sodium chloride (OCEAN) 0.65 % nasal spray   Nasal   Place 1 spray into the nose as needed for congestion.          Triage vitals: BP 112/68  Pulse 57   Temp(Src) 97.8 F (36.6 C) (Oral)  Resp 18  Ht 5\' 4"  (1.626 m)  Wt 160 lb (72.576 kg)  BMI 27.45 kg/m2  SpO2 100% Physical Exam  Nursing note and vitals reviewed. Constitutional: She is oriented to person, place, and time. She appears well-developed.  HENT:  Head: Normocephalic.  Eyes: Conjunctivae are normal.  Neck: No tracheal deviation present.  Cardiovascular:  No murmur heard. Musculoskeletal: Normal range of motion.  Minor tenderness to anterior and lateral thigh on left side. Pain with abduction. Neurovascular exam was normal.  Neurological: She is oriented to person, place, and time.  Skin: Skin is warm.  Psychiatric: She has a normal mood and affect.    ED Course  Procedures (including critical care time)  DIAGNOSTIC STUDIES: Oxygen Saturation is 100% on RA, normal by my interpretation.    COORDINATION OF CARE: 5:06 PM-Will order X-ray of left femur. Pt advised of plan for treatment and pt agrees.  Labs Review Labs Reviewed - No data to display Imaging Review Dg Femur Left  06/30/2013   CLINICAL DATA:  Proximal thigh pain and cramping for 1 week  EXAM: LEFT FEMUR - 2 VIEW  COMPARISON:  None.  FINDINGS: There is no evidence of fracture or other focal bone lesions. Soft tissues are unremarkable.  IMPRESSION: Negative.   Electronically Signed   By: Skipper Cliche M.D.   On: 06/30/2013 17:55    EKG Interpretation   None       MDM  Thigh pain.  The chart was scribed for me under my direct supervision.  I personally performed the history, physical, and medical decision making and all procedures in the evaluation of this patient.Yvette Diego, MD 06/30/13 [phone]

## 2013-07-01 ENCOUNTER — Ambulatory Visit (HOSPITAL_COMMUNITY)
Admit: 2013-07-01 | Discharge: 2013-07-01 | Disposition: A | Discharge status: Home / Self Care | Payer: Managed Care, Other (non HMO) | Attending: Emergency Medicine | Admitting: Emergency Medicine

## 2013-07-01 DIAGNOSIS — M79609 Pain in unspecified limb: Secondary | ICD-10-CM | POA: Insufficient documentation

## 2013-07-01 NOTE — ED Provider Notes (Signed)
Patient returns today for her ultrasound of her left lower extremity which was negative for DVT.  Results of study were discussed with the patient.  Plan to follow up with her PCP for further management if her symptoms persist.  She denies any worsened symptoms today.  Evalee Jefferson, PA-C 07/01/13 1004

## 2013-07-01 NOTE — ED Provider Notes (Signed)
Medical screening examination/treatment/procedure(s) were performed by non-physician practitioner and as supervising physician I was immediately available for consultation/collaboration.  Carmin Muskrat, MD 07/01/13 1530

## 2013-07-12 ENCOUNTER — Ambulatory Visit: Payer: Managed Care, Other (non HMO) | Admitting: Family Medicine

## 2013-07-13 ENCOUNTER — Ambulatory Visit (INDEPENDENT_AMBULATORY_CARE_PROVIDER_SITE_OTHER): Payer: Managed Care, Other (non HMO) | Admitting: Otolaryngology

## 2013-07-13 DIAGNOSIS — R51 Headache: Secondary | ICD-10-CM

## 2013-07-13 DIAGNOSIS — H93299 Other abnormal auditory perceptions, unspecified ear: Secondary | ICD-10-CM

## 2013-07-13 DIAGNOSIS — H6980 Other specified disorders of Eustachian tube, unspecified ear: Secondary | ICD-10-CM

## 2013-07-13 DIAGNOSIS — J310 Chronic rhinitis: Secondary | ICD-10-CM

## 2013-08-09 ENCOUNTER — Ambulatory Visit (INDEPENDENT_AMBULATORY_CARE_PROVIDER_SITE_OTHER): Payer: Managed Care, Other (non HMO) | Admitting: Family Medicine

## 2013-08-09 ENCOUNTER — Encounter: Payer: Self-pay | Admitting: Family Medicine

## 2013-08-09 VITALS — BP 114/62 | HR 64 | Temp 97.9°F | Resp 16 | Ht 64.0 in | Wt 157.0 lb

## 2013-08-09 DIAGNOSIS — R809 Proteinuria, unspecified: Secondary | ICD-10-CM

## 2013-08-09 DIAGNOSIS — M549 Dorsalgia, unspecified: Secondary | ICD-10-CM

## 2013-08-09 DIAGNOSIS — M3500 Sicca syndrome, unspecified: Secondary | ICD-10-CM

## 2013-08-09 DIAGNOSIS — N898 Other specified noninflammatory disorders of vagina: Secondary | ICD-10-CM

## 2013-08-09 DIAGNOSIS — R319 Hematuria, unspecified: Secondary | ICD-10-CM

## 2013-08-09 LAB — URINALYSIS, ROUTINE W REFLEX MICROSCOPIC
BILIRUBIN URINE: NEGATIVE
Glucose, UA: NEGATIVE mg/dL
KETONES UR: NEGATIVE mg/dL
Leukocytes, UA: NEGATIVE
Nitrite: NEGATIVE
PROTEIN: 30 mg/dL — AB
Specific Gravity, Urine: >1.03 — ABNORMAL HIGH (ref 1.005–1.030)
UROBILINOGEN UA: 0.2 mg/dL (ref 0.0–1.0)
pH: 6 (ref 5.0–8.0)

## 2013-08-09 LAB — URINALYSIS, MICROSCOPIC ONLY
CASTS: NONE SEEN
Crystals: NONE SEEN

## 2013-08-09 LAB — COMPREHENSIVE METABOLIC PANEL
ALT: 13 U/L (ref 0–35)
AST: 19 U/L (ref 0–37)
Albumin: 4.1 g/dL (ref 3.5–5.2)
Alkaline Phosphatase: 56 U/L (ref 39–117)
BILIRUBIN TOTAL: 0.3 mg/dL (ref 0.2–1.2)
BUN: 14 mg/dL (ref 6–23)
CO2: 27 mEq/L (ref 19–32)
CREATININE: 0.72 mg/dL (ref 0.50–1.10)
Calcium: 9.5 mg/dL (ref 8.4–10.5)
Chloride: 105 mEq/L (ref 96–112)
Glucose, Bld: 76 mg/dL (ref 70–99)
Potassium: 4 mEq/L (ref 3.5–5.3)
Sodium: 142 mEq/L (ref 135–145)
Total Protein: 7 g/dL (ref 6.0–8.3)

## 2013-08-09 LAB — WET PREP FOR TRICH, YEAST, CLUE
CLUE CELLS WET PREP: NONE SEEN
TRICH WET PREP: NONE SEEN
YEAST WET PREP: NONE SEEN

## 2013-08-09 LAB — CBC WITH DIFFERENTIAL/PLATELET
Basophils Absolute: 0.1 10*3/uL (ref 0.0–0.1)
Basophils Relative: 2 % — ABNORMAL HIGH (ref 0–1)
EOS ABS: 0.1 10*3/uL (ref 0.0–0.7)
EOS PCT: 2 % (ref 0–5)
HCT: 38.1 % (ref 36.0–46.0)
Hemoglobin: 12.8 g/dL (ref 12.0–15.0)
Lymphocytes Relative: 52 % — ABNORMAL HIGH (ref 12–46)
Lymphs Abs: 2.1 10*3/uL (ref 0.7–4.0)
MCH: 30.5 pg (ref 26.0–34.0)
MCHC: 33.6 g/dL (ref 30.0–36.0)
MCV: 90.9 fL (ref 78.0–100.0)
Monocytes Absolute: 0.3 10*3/uL (ref 0.1–1.0)
Monocytes Relative: 8 % (ref 3–12)
Neutro Abs: 1.4 10*3/uL — ABNORMAL LOW (ref 1.7–7.7)
Neutrophils Relative %: 36 % — ABNORMAL LOW (ref 43–77)
PLATELETS: 338 10*3/uL (ref 150–400)
RBC: 4.19 MIL/uL (ref 3.87–5.11)
RDW: 12.8 % (ref 11.5–15.5)
WBC: 4 10*3/uL (ref 4.0–10.5)

## 2013-08-09 MED ORDER — NAPROXEN 500 MG PO TABS: 500.0000 mg | ORAL_TABLET | Freq: Two times a day (BID) | ORAL | Status: DC

## 2013-08-09 NOTE — Patient Instructions (Addendum)
Try 2 of the muscle relaxers at bedtime  Take naprosyn as needed twice a day Wet prep is normal You have blood in your urine,  We will check your labs and send your urine for a culture F/U pending

## 2013-08-09 NOTE — Progress Notes (Signed)
[name], female   DOB: 1971/04/05, 43 y.o.   MRN: [id number]     Subjective:    [name], female    DOB: 1970/07/26, 43 y.o.   MRN: [id number]  Patient presents for Vaginal discharge, Back Pain and UA F/U  patient here with multiple concerns. She's had some vaginal discharge with some mild discomfort with sexual intercourse over the past week. She denies any bleeding she is status post hysterectomy, he was treated for bacterial vaginosis back in December.  She also complains of chronic back pain she's had negative x-rays of her spine she has more spasms and not in her muscles. She states her entire back hurts from her shoulders all the way down. She does get massages on a regular basis and has been told that she had spasm. She was recently put on Flexeril 5 mg for her TMJ but has not noticed much difference with her back. When she does exercise her pain improves. She's not taking any other over-the-counter medications. No paresthesias of the lower extremities no change in bowel or bladder  She also was evaluated for an odor to her urine by the urgent care she had some mild protein in her urine she would like this rechecked today she's not had any dysuria  She'll be following up with rheumatology for her Sjrogens syndrome as well as her eye Dr.because of dry eye    Review Of Systems:  GEN- denies fatigue, fever, weight loss,weakness, recent illness HEENT- denies eye drainage, change in vision, nasal discharge, CVS- denies chest pain, palpitations RESP- denies SOB, cough, wheeze ABD- denies N/V, change in stools, abd pain GU- denies dysuria, hematuria, dribbling, incontinence MSK- + joint pain, +muscle aches, injury Neuro- denies headache, dizziness, syncope, seizure activity       Objective:    BP 114/62  Pulse 64  Temp(Src) 97.9 F (36.6 C)  Resp 16  Ht 5\' 4"  (1.626 m)  Wt 157 lb (71.215 kg)  BMI 26.94 kg/m2 GEN-NAD,alert and oriented x  3 MSK- Spine NT, + paraspinal spasm, neg SLR, good ROM back and Neck GU- normal external genitalia, vaginal mucosa pink and moist, no cervix visualized  no ovarian masses,+clear discharge Ext- No edema       Assessment & Plan:      Problem List Items Addressed This Visit   Back pain     No red flags on exam, no disc symptoms or impingment symptoms,  I think this is muscle spasm and tension Will given naprosyn Okay to try 10mg  of flexeril    Relevant Medications      cyclobenzaprine (FLEXERIL) 5 MG tablet      naproxen (NAPROSYN) tablet    Other Visit Diagnoses   Vaginal discharge    -  Primary    normal flora    Relevant Orders       WET PREP FOR TRICH, YEAST, CLUE (Completed)    Proteinuria        Relevant Orders       Urinalysis, Routine w reflex microscopic (Completed)       CBC with Differential       Comprehensive metabolic panel       Urinalysis, Routine w reflex microscopic       Urine culture    Hematuria        Repeat Urine in 4 weeks, givne urine slip, culture to be sent    Relevant Orders  Urinalysis, Routine w reflex microscopic       Urine culture       Note: This dictation was prepared with Dragon dictation along with smaller phrase technology. Any transcriptional errors that result from this process are unintentional.

## 2013-08-09 NOTE — Assessment & Plan Note (Signed)
No red flags on exam, no disc symptoms or impingment symptoms,  I think this is muscle spasm and tension Will given naprosyn Okay to try 10mg  of flexeril

## 2013-08-10 ENCOUNTER — Encounter: Payer: Self-pay | Admitting: *Deleted

## 2013-08-10 LAB — URINE CULTURE: Colony Count: 9000

## 2013-08-14 ENCOUNTER — Telehealth: Payer: Self-pay | Admitting: Family Medicine

## 2013-08-14 ENCOUNTER — Encounter: Payer: Self-pay | Admitting: *Deleted

## 2013-08-14 NOTE — Telephone Encounter (Signed)
Call back number is [phone] Pt is wanting to know if she can have a prescription for percocet, even though it may be listed on her chart that it makes her itch it doesn't break her out in a rash just makes her itch so she thinks she will be fine taking it. Please call her and let her  Know if she can have this

## 2013-08-15 MED ORDER — OXYCODONE-ACETAMINOPHEN 5-325 MG PO TABS: 1.0000 | ORAL_TABLET | Freq: Four times a day (QID) | ORAL | Status: DC | PRN

## 2013-08-15 NOTE — Telephone Encounter (Signed)
Script written for 20 for back pain

## 2013-08-15 NOTE — Telephone Encounter (Signed)
MD please advise

## 2013-08-29 ENCOUNTER — Ambulatory Visit (INDEPENDENT_AMBULATORY_CARE_PROVIDER_SITE_OTHER): Payer: Managed Care, Other (non HMO) | Admitting: Family Medicine

## 2013-08-29 ENCOUNTER — Encounter: Payer: Self-pay | Admitting: Family Medicine

## 2013-08-29 VITALS — BP 112/80 | HR 66 | Temp 97.9°F | Resp 18 | Ht 64.0 in | Wt 159.0 lb

## 2013-08-29 DIAGNOSIS — J019 Acute sinusitis, unspecified: Secondary | ICD-10-CM

## 2013-08-29 MED ORDER — AMOXICILLIN 500 MG PO CAPS: 500.0000 mg | ORAL_CAPSULE | Freq: Three times a day (TID) | ORAL | Status: DC

## 2013-08-29 NOTE — Patient Instructions (Signed)
Use the Norel AD  - 1 tablet twice a day for sinuses Take antibiotics if not better  F/U as previous

## 2013-08-29 NOTE — Assessment & Plan Note (Addendum)
I think there is a component of allergies causing this as it is too early for bacterial infection. I reiterated this to the patient. As she does have chronic sinusitis we will start with Nasonex as well as allergy medication- Norel AD samples given. I have given her prescription for amoxicillin only to start on Friday or Saturday if she is not better

## 2013-08-29 NOTE — Progress Notes (Signed)
[name], female   DOB: [birth year], 43 y.o.   MRN: [id number]   Subjective:    [name], female    DOB: [birth year], 43 y.o.   MRN: [id number]  Patient presents for sinus pressure  patient here with sinus pressure, drainage for the past 3 days she's also had some sneezing. She states that she had some hot and cold flashes about a week ago. She has been taking NyQuil with no improvement. The drainage causes a postnasal drip which makes her cough. She's history of chronic sinus problems and nasal congestion she's had also problems with TMJ and is seen multiple ear nose and throat specialist.    Review Of Systems:  GEN- denies fatigue, fever, weight loss,weakness, recent illness HEENT- denies eye drainage, change in vision, +nasal discharge, CVS- denies chest pain, palpitations RESP- denies SOB, cough, wheeze Neuro- denies headache, dizziness, syncope, seizure activity       Objective:    BP 112/80  Pulse 66  Temp(Src) 97.9 F (36.6 C) (Oral)  Resp 18  Ht 5\' 4"  (1.626 m)  Wt 159 lb (72.122 kg)  BMI 27.28 kg/m2 GEN- NAD, alert and oriented x3 HEENT- PERRL, EOMI, non injected sclera, pink conjunctiva, MMM, oropharynx clear, TM clear bilat no effusion,  + maxillary sinus tenderness, +Nasal drainage  Neck- Supple, no LAD CVS- RRR, no murmur RESP-CTAB EXT- No edema Pulses- Radial 2+         Assessment & Plan:      Problem List Items Addressed This Visit   None      Note: This dictation was prepared with Dragon dictation along with smaller phrase technology. Any transcriptional errors that result from this process are unintentional.

## 2013-08-31 ENCOUNTER — Ambulatory Visit (INDEPENDENT_AMBULATORY_CARE_PROVIDER_SITE_OTHER): Payer: Managed Care, Other (non HMO) | Admitting: Otolaryngology

## 2013-09-11 ENCOUNTER — Ambulatory Visit: Payer: Managed Care, Other (non HMO) | Admitting: Family Medicine

## 2013-09-18 ENCOUNTER — Encounter: Payer: Self-pay | Admitting: Family Medicine

## 2013-09-18 ENCOUNTER — Ambulatory Visit (INDEPENDENT_AMBULATORY_CARE_PROVIDER_SITE_OTHER): Payer: Managed Care, Other (non HMO) | Admitting: Family Medicine

## 2013-09-18 VITALS — BP 124/70 | HR 76 | Temp 97.7°F | Resp 18 | Ht 64.0 in | Wt 159.0 lb

## 2013-09-18 DIAGNOSIS — L723 Sebaceous cyst: Secondary | ICD-10-CM

## 2013-09-18 DIAGNOSIS — R3129 Other microscopic hematuria: Secondary | ICD-10-CM

## 2013-09-18 DIAGNOSIS — L729 Follicular cyst of the skin and subcutaneous tissue, unspecified: Secondary | ICD-10-CM

## 2013-09-18 LAB — URINALYSIS, MICROSCOPIC ONLY
Casts: NONE SEEN
Crystals: NONE SEEN
RBC / HPF: NONE SEEN RBC/hpf (ref <3)

## 2013-09-18 LAB — URINALYSIS, ROUTINE W REFLEX MICROSCOPIC
Bilirubin Urine: NEGATIVE
Glucose, UA: NEGATIVE mg/dL
Ketones, ur: NEGATIVE mg/dL
LEUKOCYTES UA: NEGATIVE
NITRITE: NEGATIVE
PROTEIN: NEGATIVE mg/dL
Specific Gravity, Urine: 1.015 (ref 1.005–1.030)
Urobilinogen, UA: 0.2 mg/dL (ref 0.0–1.0)
pH: 5.5 (ref 5.0–8.0)

## 2013-09-18 NOTE — Progress Notes (Signed)
[name], female   DOB: 1970/06/05, 43 y.o.   MRN: [id number]   Subjective:    [name], female    DOB: 20-Jul-1970, 43 y.o.   MRN: [id number]  Patient presents for 1 month F/U UTI  patient here to follow microscopic hematuria. Her previous urine culture did not show any significant growth. She's been drinking more water and states her urine smell like. She's not had any pelvic pain. No dysuria. A complete antibiotics secondary to a sinus infection treated a couple weeks ago. He continues to have difficulty with her TMJ pain which is chronic Should also like me to recheck the small cyst in her scalp    Review Of Systems:  GEN- denies fatigue, fever, weight loss,weakness, recent illness HEENT- denies eye drainage, change in vision, nasal discharge, CVS- denies chest pain, palpitations RESP- denies SOB, cough, wheeze ABD- denies N/V, change in stools, abd pain GU- denies dysuria, hematuria, dribbling, incontinence Neuro- denies headache, dizziness, syncope, seizure activity       Objective:    BP 124/70  Pulse 76  Temp(Src) 97.7 F (36.5 C) (Oral)  Resp 18  Ht 5\' 4"  (1.626 m)  Wt 159 lb (72.122 kg)  BMI 27.28 kg/m2 GEN- NAD, alert and oriented x3 HEENT- PERRL, EOMI, non injected sclera, pink conjunctiva, MMM, oropharynx clear Neck- Supple, no LAD Skin- tiny pea size cyst in right and left  parietal region , 1 lesion center of scalp         Assessment & Plan:      Problem List Items Addressed This Visit   Scalp cyst     These are very tiny cyst in her scalp which she's had for many years. She did have large ones in the past it had to be drained. At this time I think we can monitor them if she does not want any intervention and they're very small.     Other Visit Diagnoses   Microscopic hematuria    -  Primary    There no red blood cells seen on microscopy in the urinalysis is fairly normal. No further workup needed at this time     Relevant Orders       Urinalysis, Routine w reflex microscopic (Completed)       Note: This dictation was prepared with Dragon dictation along with smaller phrase technology. Any transcriptional errors that result from this process are unintentional.

## 2013-09-18 NOTE — Assessment & Plan Note (Signed)
These are very tiny cyst in her scalp which she's had for many years. She did have large ones in the past it had to be drained. At this time I think we can monitor them if she does not want any intervention and they're very small.

## 2013-09-18 NOTE — Patient Instructions (Signed)
F/u 4 MONTHS FOR PHYSICAL- COME FASTIG

## 2013-10-10 ENCOUNTER — Other Ambulatory Visit: Payer: Self-pay | Admitting: Family Medicine

## 2013-10-10 DIAGNOSIS — Z1231 Encounter for screening mammogram for malignant neoplasm of breast: Secondary | ICD-10-CM

## 2013-10-13 ENCOUNTER — Encounter: Payer: Managed Care, Other (non HMO) | Admitting: Family Medicine

## 2013-10-17 ENCOUNTER — Other Ambulatory Visit: Payer: Self-pay | Admitting: Family Medicine

## 2013-10-17 ENCOUNTER — Other Ambulatory Visit: Payer: Managed Care, Other (non HMO)

## 2013-10-17 DIAGNOSIS — E785 Hyperlipidemia, unspecified: Secondary | ICD-10-CM

## 2013-10-17 DIAGNOSIS — Z1321 Encounter for screening for nutritional disorder: Secondary | ICD-10-CM

## 2013-10-17 LAB — LIPID PANEL
Cholesterol: 210 mg/dL — ABNORMAL HIGH (ref 0–200)
HDL: 52 mg/dL (ref >39)
LDL CALC: 125 mg/dL — AB (ref 0–99)
TRIGLYCERIDES: 167 mg/dL — AB (ref <150)
Total CHOL/HDL Ratio: 4 Ratio
VLDL: 33 mg/dL (ref 0–40)

## 2013-10-18 ENCOUNTER — Encounter: Payer: Self-pay | Admitting: Family Medicine

## 2013-10-18 ENCOUNTER — Ambulatory Visit (INDEPENDENT_AMBULATORY_CARE_PROVIDER_SITE_OTHER): Payer: Managed Care, Other (non HMO) | Admitting: Family Medicine

## 2013-10-18 VITALS — BP 112/68 | HR 80 | Temp 97.5°F | Resp 14 | Ht 64.0 in | Wt 160.0 lb

## 2013-10-18 DIAGNOSIS — R0981 Nasal congestion: Secondary | ICD-10-CM

## 2013-10-18 DIAGNOSIS — E785 Hyperlipidemia, unspecified: Secondary | ICD-10-CM

## 2013-10-18 DIAGNOSIS — J3489 Other specified disorders of nose and nasal sinuses: Secondary | ICD-10-CM

## 2013-10-18 DIAGNOSIS — M549 Dorsalgia, unspecified: Secondary | ICD-10-CM

## 2013-10-18 DIAGNOSIS — Z0000 Encounter for general adult medical examination without abnormal findings: Secondary | ICD-10-CM

## 2013-10-18 DIAGNOSIS — K219 Gastro-esophageal reflux disease without esophagitis: Secondary | ICD-10-CM

## 2013-10-18 LAB — VITAMIN D 25 HYDROXY (VIT D DEFICIENCY, FRACTURES): Vit D, 25-Hydroxy: 35 ng/mL (ref 30–89)

## 2013-10-18 MED ORDER — CYCLOBENZAPRINE HCL 5 MG PO TABS: 5.0000 mg | ORAL_TABLET | Freq: Every day | ORAL | Status: DC

## 2013-10-18 MED ORDER — DEXLANSOPRAZOLE 60 MG PO CPDR: 60.0000 mg | DELAYED_RELEASE_CAPSULE | Freq: Every day | ORAL | Status: DC

## 2013-10-18 NOTE — Assessment & Plan Note (Signed)
Handout given on dietary changes, no meds needed

## 2013-10-18 NOTE — Progress Notes (Signed)
[name], female   DOB: [birth year], 43 y.o.   MRN: [id number]   Subjective:    [name], female    DOB: 1970-10-10, 43 y.o.   MRN: [id number]  Patient presents for CPE- no PAP and back/ neck pain  patient here for complete physical exam. She does not require Pap smears she is status post hysterectomy. She has had recent GYN exam by me. She has a mammogram scheduled for June. She had fasting labs done today which were reviewed with her she had mild elevated lipids with an LDL elevated as well as triglycerides. Medications reviewed-C. she did have some increased neck and back pain she continues to get her massage therapist she was given some insoles at work and she stands in one place in his repetitive motions all day long this has helped her lower back. She requests a refill on her Flexeril 5 mg. Chart has appointment scheduled with her eye Dr.  She also continues to have nasal congestion which has been going on for greater than 2 years. She is seeing multiple ear nose and throat physicians. She feels like she can't get the mucus out.  Also has had some increased heartburn she was seen by GI in the past was on Dexilant she would like to restart this.    Review Of Systems:  GEN- denies fatigue, fever, weight loss,weakness, recent illness HEENT- denies eye drainage, change in vision, nasal discharge, CVS- denies chest pain, palpitations RESP- denies SOB, cough, wheeze ABD- denies N/V, change in stools, abd pain GU- denies dysuria, hematuria, dribbling, incontinence MSK- + joint pain, muscle aches, injury Neuro- denies headache, dizziness, syncope, seizure activity       Objective:    BP 112/68  Pulse 80  Temp(Src) 97.5 F (36.4 C) (Oral)  Resp 14  Ht 5\' 4"  (1.626 m)  Wt 160 lb (72.576 kg)  BMI 27.45 kg/m2 GEN- NAD, alert and oriented x3 HEENT- PERRL, EOMI, non injected sclera, pink conjunctiva, MMM, oropharynx clear, Tm clear bilat, nares  clear Neck- Supple, FROM CVS- RRR, no murmur RESP-CTAB ABD-NABS,soft,NT,ND MSK- Mild TTp lumbar parapsinal, + spasm, spine NT, neg SLR, good ROM EXT- No edema Pulses- Radial, DP- 2+        Assessment & Plan:      Problem List Items Addressed This Visit   None      Note: This dictation was prepared with Dragon dictation along with smaller phrase technology. Any transcriptional errors that result from this process are unintentional.

## 2013-10-18 NOTE — Assessment & Plan Note (Signed)
MSK pain, no red flags, flexeril refilled

## 2013-10-18 NOTE — Patient Instructions (Signed)
Work on [employer] for acid reflux Try the Mucinex D for congestion Change F/U to 4 months Fat and Cholesterol Control Diet Fat and cholesterol levels in your blood and organs are influenced by your diet. High levels of fat and cholesterol may lead to diseases of the heart, small and large blood vessels, gallbladder, liver, and pancreas. CONTROLLING FAT AND CHOLESTEROL WITH DIET Although exercise and lifestyle factors are important, your diet is key. That is because certain foods are known to raise cholesterol and others to lower it. The goal is to balance foods for their effect on cholesterol and more importantly, to replace saturated and trans fat with other types of fat, such as monounsaturated fat, polyunsaturated fat, and omega-3 fatty acids. On average, a person should consume no more than 15 to 17 g of saturated fat daily. Saturated and trans fats are considered "bad" fats, and they will raise LDL cholesterol. Saturated fats are primarily found in animal products such as meats, butter, and cream. However, that does not mean you need to give up all your favorite foods. Today, there are good tasting, low-fat, low-cholesterol substitutes for most of the things you like to eat. Choose low-fat or nonfat alternatives. Choose round or loin cuts of red meat. These types of cuts are lowest in fat and cholesterol. Chicken (without the skin), fish, veal, and ground [place] breast are great choices. Eliminate fatty meats, such as hot dogs and salami. Even shellfish have little or no saturated fat. Have a 3 oz (85 g) portion when you eat lean meat, poultry, or fish. Trans fats are also called "partially hydrogenated oils." They are oils that have been scientifically manipulated so that they are solid at room temperature resulting in a longer shelf life and improved taste and texture of foods in which they are added. Trans fats are found in stick margarine, some tub margarines, cookies, crackers, and baked  goods.  When baking and cooking, oils are a great substitute for butter. The monounsaturated oils are especially beneficial since it is believed they lower LDL and raise HDL. The oils you should avoid entirely are saturated tropical oils, such as coconut and palm.  Remember to eat a lot from food groups that are naturally free of saturated and trans fat, including fish, fruit, vegetables, beans, grains (barley, rice, couscous, bulgur wheat), and pasta (without cream sauces).  IDENTIFYING FOODS THAT LOWER FAT AND CHOLESTEROL  Soluble fiber may lower your cholesterol. This type of fiber is found in fruits such as apples, vegetables such as broccoli, potatoes, and carrots, legumes such as beans, peas, and lentils, and grains such as barley. Foods fortified with plant sterols (phytosterol) may also lower cholesterol. You should eat at least 2 g per day of these foods for a cholesterol lowering effect.  Read package labels to identify low-saturated fats, trans fat free, and low-fat foods at the supermarket. Select cheeses that have only 2 to 3 g saturated fat per ounce. Use a heart-healthy tub margarine that is free of trans fats or partially hydrogenated oil. When buying baked goods (cookies, crackers), avoid partially hydrogenated oils. Breads and muffins should be made from whole grains (whole-wheat or whole oat flour, instead of "flour" or "enriched flour"). Buy non-creamy canned soups with reduced salt and no added fats.  FOOD PREPARATION TECHNIQUES  Never deep-fry. If you must fry, either stir-fry, which uses very little fat, or use non-stick cooking sprays. When possible, broil, bake, or roast meats, and steam vegetables. Instead of putting butter  or margarine on vegetables, use lemon and herbs, applesauce, and cinnamon (for squash and sweet potatoes). Use nonfat yogurt, salsa, and low-fat dressings for salads.  LOW-SATURATED FAT / LOW-FAT FOOD SUBSTITUTES Meats / Saturated Fat (g)  Avoid: Steak,  marbled (3 oz/85 g) / 11 g  Choose: Steak, lean (3 oz/85 g) / 4 g  Avoid: Hamburger (3 oz/85 g) / 7 g  Choose: Hamburger, lean (3 oz/85 g) / 5 g  Avoid: Ham (3 oz/85 g) / 6 g  Choose: Ham, lean cut (3 oz/85 g) / 2.4 g  Avoid: Chicken, with skin, dark meat (3 oz/85 g) / 4 g  Choose: Chicken, skin removed, dark meat (3 oz/85 g) / 2 g  Avoid: Chicken, with skin, light meat (3 oz/85 g) / 2.5 g  Choose: Chicken, skin removed, light meat (3 oz/85 g) / 1 g Dairy / Saturated Fat (g)  Avoid: Whole milk (1 cup) / 5 g  Choose: Low-fat milk, 2% (1 cup) / 3 g  Choose: Low-fat milk, 1% (1 cup) / 1.5 g  Choose: Skim milk (1 cup) / 0.3 g  Avoid: Hard cheese (1 oz/28 g) / 6 g  Choose: Skim milk cheese (1 oz/28 g) / 2 to 3 g  Avoid: Cottage cheese, 4% fat (1 cup) / 6.5 g  Choose: Low-fat cottage cheese, 1% fat (1 cup) / 1.5 g  Avoid: Ice cream (1 cup) / 9 g  Choose: Sherbet (1 cup) / 2.5 g  Choose: Nonfat frozen yogurt (1 cup) / 0.3 g  Choose: Frozen fruit bar / trace  Avoid: Whipped cream (1 tbs) / 3.5 g  Choose: Nondairy whipped topping (1 tbs) / 1 g Condiments / Saturated Fat (g)  Avoid: Mayonnaise (1 tbs) / 2 g  Choose: Low-fat mayonnaise (1 tbs) / 1 g  Avoid: Butter (1 tbs) / 7 g  Choose: Extra light margarine (1 tbs) / 1 g  Avoid: Coconut oil (1 tbs) / 11.8 g  Choose: Olive oil (1 tbs) / 1.8 g  Choose: Corn oil (1 tbs) / 1.7 g  Choose: Safflower oil (1 tbs) / 1.2 g  Choose: Sunflower oil (1 tbs) / 1.4 g  Choose: Soybean oil (1 tbs) / 2.4 g  Choose: Canola oil (1 tbs) / 1 g Document Released: 05/18/2005 Document Revised: 09/12/2012 Document Reviewed: 11/06/2010 [employer] Patient Information 2014 [place], [place].

## 2013-10-18 NOTE — Assessment & Plan Note (Signed)
Mammo scheduled Immunizations UTD NO PAP

## 2013-10-18 NOTE — Assessment & Plan Note (Signed)
Try Mucinex D, otherwise f/u ENT

## 2013-10-18 NOTE — Assessment & Plan Note (Signed)
Restart dexilant,

## 2013-11-06 ENCOUNTER — Ambulatory Visit (HOSPITAL_COMMUNITY)
Admission: RE | Admit: 2013-11-06 | Discharge: 2013-11-06 | Disposition: A | Discharge status: Home / Self Care | Payer: Managed Care, Other (non HMO) | Source: Ambulatory Visit | Attending: Family Medicine | Admitting: Family Medicine

## 2013-11-06 DIAGNOSIS — Z1231 Encounter for screening mammogram for malignant neoplasm of breast: Secondary | ICD-10-CM

## 2013-11-21 ENCOUNTER — Other Ambulatory Visit: Payer: Managed Care, Other (non HMO)

## 2013-12-22 ENCOUNTER — Ambulatory Visit: Payer: Managed Care, Other (non HMO) | Admitting: Family Medicine

## 2013-12-26 ENCOUNTER — Ambulatory Visit (INDEPENDENT_AMBULATORY_CARE_PROVIDER_SITE_OTHER): Payer: Managed Care, Other (non HMO) | Admitting: Family Medicine

## 2013-12-26 ENCOUNTER — Encounter: Payer: Self-pay | Admitting: Family Medicine

## 2013-12-26 VITALS — BP 128/64 | HR 68 | Temp 97.8°F | Resp 14 | Ht 64.0 in | Wt 164.0 lb

## 2013-12-26 DIAGNOSIS — E663 Overweight: Secondary | ICD-10-CM

## 2013-12-26 DIAGNOSIS — R300 Dysuria: Secondary | ICD-10-CM

## 2013-12-26 DIAGNOSIS — K5900 Constipation, unspecified: Secondary | ICD-10-CM

## 2013-12-26 DIAGNOSIS — T148 Other injury of unspecified body region: Secondary | ICD-10-CM

## 2013-12-26 DIAGNOSIS — N6459 Other signs and symptoms in breast: Secondary | ICD-10-CM

## 2013-12-26 DIAGNOSIS — W57XXXA Bitten or stung by nonvenomous insect and other nonvenomous arthropods, initial encounter: Secondary | ICD-10-CM

## 2013-12-26 DIAGNOSIS — N6452 Nipple discharge: Secondary | ICD-10-CM | POA: Insufficient documentation

## 2013-12-26 LAB — URINALYSIS, ROUTINE W REFLEX MICROSCOPIC
BILIRUBIN URINE: NEGATIVE
Glucose, UA: NEGATIVE mg/dL
Ketones, ur: NEGATIVE mg/dL
Leukocytes, UA: NEGATIVE
Nitrite: NEGATIVE
PROTEIN: NEGATIVE mg/dL
SPECIFIC GRAVITY, URINE: 1.015 (ref 1.005–1.030)
Urobilinogen, UA: 0.2 mg/dL (ref 0.0–1.0)
pH: 5.5 (ref 5.0–8.0)

## 2013-12-26 LAB — URINALYSIS, MICROSCOPIC ONLY
CRYSTALS: NONE SEEN
Casts: NONE SEEN

## 2013-12-26 LAB — PROLACTIN: Prolactin: 5.5 ng/mL

## 2013-12-26 MED ORDER — TRIAMCINOLONE ACETONIDE 0.1 % EX CREA: 1.0000 "application " | TOPICAL_CREAM | Freq: Two times a day (BID) | CUTANEOUS | Status: DC

## 2013-12-26 NOTE — Assessment & Plan Note (Signed)
We discussed use of the Mediterranean diet which is low carb to help with her weight loss goals for work

## 2013-12-26 NOTE — Progress Notes (Signed)
[name], female   DOB: [birth year], 43 y.o.   MRN: [id number]   Subjective:    [name], female    DOB: February 24, 1971, 43 y.o.   MRN: [id number]  Patient presents for L breast discharge from nipple, Skin Irritation and Abdominal pain  patient here with bilateral clear discharge from the nipples. She's had this on and off the past couple months. She did have a normal mammogram about 4 weeks ago. She denies any breast pain. She status post hysterectomy.  She also complains of some bites on the back of her thighs that she noticed last week they're red and itchy she has been using some topical cortisone with minimal relief.  Dysuria with some pressure with urinating first thing in the morning unknown period of time. She also suffers with constipation on and off and states that she was planning to take a laxative this week as she has been straining with her bowel movements. She denies any light in her ureter she is known to have microscopic hematuria. She denies any vaginal discharge.  She's also worried about her weight want recommendations on diet that she could use for weight loss  Review Of Systems:  GEN- denies fatigue, fever, weight loss,weakness, recent illness HEENT- denies eye drainage, change in vision, nasal discharge, CVS- denies chest pain, palpitations RESP- denies SOB, cough, wheeze ABD- denies N/V, change in stools, abd pain GU- denies dysuria, hematuria, dribbling, incontinence MSK- denies joint pain, muscle aches, injury Neuro- denies headache, dizziness, syncope, seizure activity       Objective:    BP 128/64  Pulse 68  Temp(Src) 97.8 F (36.6 C) (Oral)  Resp 14  Ht 5\' 4"  (1.626 m)  Wt 164 lb (74.39 kg)  BMI 28.14 kg/m2 GEN- NAD, alert and oriented x3 HEENT- PERRL, EOMI, non injected sclera, pink conjunctiva, MMM, oropharynx clear Neck- Supple, no thyromegaly Breast- normal symmetry, no nipple inversion,+ clear nipple  drainage, no nodules or lumps felt Nodes- no axillary nodes CVS- RRR, no murmur RESP-CTAB ABD-NABS,soft,NT,ND, no CVA tenderness Skin- few scattered bug bite lesions with mild erythema surrounding, no induration EXT- No edema Pulses- Radial, DP- 2+  Hemeoccult negative on nipple discharge     Assessment & Plan:      Problem List Items Addressed This Visit   None    Visit Diagnoses   Dysuria    -  Primary    Relevant Orders       Urinalysis, Routine w reflex microscopic       Note: This dictation was prepared with Dragon dictation along with smaller phrase technology. Any transcriptional errors that result from this process are unintentional.

## 2013-12-26 NOTE — Assessment & Plan Note (Signed)
I will change her to triamcinolone ointment twice a day

## 2013-12-26 NOTE — Addendum Note (Signed)
Addended by: Vic Blackbird F on: 12/26/2013 02:47 PM   Modules accepted: Orders

## 2013-12-26 NOTE — Patient Instructions (Signed)
Urine test is normal Use cream twice a day on skin We will call with labs  [place] diet F/U as needed

## 2013-12-26 NOTE — Assessment & Plan Note (Signed)
I think the changes in her bowels may be causing some of the pressure in her bladder there is no sign of a urinary tract infection. I would like her to use MiraLAX once a day to help with her bowels  Dietary changes will also be made.

## 2013-12-26 NOTE — Assessment & Plan Note (Signed)
She has bilateral clear discharge I think this is benign. I did go ahead and check a prolactin level. Her mammogram was also normal Hemoccult at bedside was negative

## 2013-12-27 LAB — URINE CULTURE
Colony Count: NO GROWTH
Organism ID, Bacteria: NO GROWTH

## 2014-01-01 ENCOUNTER — Ambulatory Visit: Payer: Managed Care, Other (non HMO) | Admitting: Family Medicine

## 2014-01-03 ENCOUNTER — Ambulatory Visit: Payer: Managed Care, Other (non HMO) | Admitting: Family Medicine

## 2014-01-08 ENCOUNTER — Telehealth: Payer: Self-pay | Admitting: Family Medicine

## 2014-01-08 MED ORDER — OXYCODONE-ACETAMINOPHEN 5-325 MG PO TABS: 1.0000 | ORAL_TABLET | Freq: Four times a day (QID) | ORAL | Status: DC | PRN

## 2014-01-08 NOTE — Telephone Encounter (Signed)
Prescription printed.   Call placed to patient. No answer.

## 2014-01-08 NOTE — Telephone Encounter (Signed)
Okay to refill #30 Also let pt know she is on the schedule for a cholesterol check tomorrow, but this can not be done for another 4 weeks

## 2014-01-08 NOTE — Telephone Encounter (Signed)
Ok to refill??  Last office visit 12/26/2013.  Last refill 08/15/2013.

## 2014-01-08 NOTE — Telephone Encounter (Signed)
Patient returned call and made aware.

## 2014-01-08 NOTE — Telephone Encounter (Signed)
[phone]   Pt is needing a refill on her percocet

## 2014-01-09 ENCOUNTER — Ambulatory Visit: Payer: Managed Care, Other (non HMO) | Admitting: Family Medicine

## 2014-01-19 ENCOUNTER — Encounter: Payer: Managed Care, Other (non HMO) | Admitting: Family Medicine

## 2014-02-13 ENCOUNTER — Ambulatory Visit: Payer: Managed Care, Other (non HMO) | Admitting: Family Medicine

## 2014-02-14 ENCOUNTER — Ambulatory Visit: Payer: Managed Care, Other (non HMO) | Admitting: Family Medicine

## 2014-03-19 ENCOUNTER — Ambulatory Visit: Payer: Managed Care, Other (non HMO) | Admitting: Family Medicine

## 2014-03-20 ENCOUNTER — Ambulatory Visit: Payer: Managed Care, Other (non HMO) | Admitting: Family Medicine

## 2014-03-21 ENCOUNTER — Other Ambulatory Visit: Payer: Self-pay | Admitting: Family Medicine

## 2014-03-21 ENCOUNTER — Ambulatory Visit (INDEPENDENT_AMBULATORY_CARE_PROVIDER_SITE_OTHER): Payer: Managed Care, Other (non HMO) | Admitting: Family Medicine

## 2014-03-21 ENCOUNTER — Encounter: Payer: Self-pay | Admitting: Family Medicine

## 2014-03-21 VITALS — BP 122/68 | HR 64 | Temp 97.6°F | Resp 18 | Ht 63.0 in | Wt 157.0 lb

## 2014-03-21 DIAGNOSIS — A499 Bacterial infection, unspecified: Secondary | ICD-10-CM

## 2014-03-21 DIAGNOSIS — F419 Anxiety disorder, unspecified: Secondary | ICD-10-CM

## 2014-03-21 DIAGNOSIS — N760 Acute vaginitis: Secondary | ICD-10-CM

## 2014-03-21 DIAGNOSIS — B9689 Other specified bacterial agents as the cause of diseases classified elsewhere: Secondary | ICD-10-CM | POA: Insufficient documentation

## 2014-03-21 DIAGNOSIS — G4700 Insomnia, unspecified: Secondary | ICD-10-CM

## 2014-03-21 DIAGNOSIS — R309 Painful micturition, unspecified: Secondary | ICD-10-CM

## 2014-03-21 DIAGNOSIS — K5900 Constipation, unspecified: Secondary | ICD-10-CM

## 2014-03-21 DIAGNOSIS — N898 Other specified noninflammatory disorders of vagina: Secondary | ICD-10-CM

## 2014-03-21 LAB — WET PREP FOR TRICH, YEAST, CLUE
Trich, Wet Prep: NONE SEEN
Yeast Wet Prep HPF POC: NONE SEEN

## 2014-03-21 LAB — URINALYSIS, ROUTINE W REFLEX MICROSCOPIC
Bilirubin Urine: NEGATIVE
Glucose, UA: NEGATIVE mg/dL
Ketones, ur: NEGATIVE mg/dL
NITRITE: NEGATIVE
PH: 5 (ref 5.0–8.0)
Protein, ur: NEGATIVE mg/dL
Specific Gravity, Urine: 1.02 (ref 1.005–1.030)
Urobilinogen, UA: 0.2 mg/dL (ref 0.0–1.0)

## 2014-03-21 LAB — URINALYSIS, MICROSCOPIC ONLY
CASTS: NONE SEEN
CRYSTALS: NONE SEEN

## 2014-03-21 MED ORDER — LORAZEPAM 0.5 MG PO TABS: 0.5000 mg | ORAL_TABLET | Freq: Every evening | ORAL | Status: DC | PRN

## 2014-03-21 MED ORDER — CYCLOBENZAPRINE HCL 5 MG PO TABS: 5.0000 mg | ORAL_TABLET | Freq: Every day | ORAL | Status: DC

## 2014-03-21 MED ORDER — FLUCONAZOLE 150 MG PO TABS: 150.0000 mg | ORAL_TABLET | Freq: Every day | ORAL | Status: DC

## 2014-03-21 MED ORDER — METRONIDAZOLE 500 MG PO TABS: 500.0000 mg | ORAL_TABLET | Freq: Two times a day (BID) | ORAL | Status: DC

## 2014-03-21 NOTE — Progress Notes (Signed)
[name], female   DOB: 1971-01-31, 43 y.o.   MRN: [id number]   Subjective:    [name], female    DOB: 01-Apr-1971, 43 y.o.   MRN: [id number]  Patient presents for Vaginal Irritation and Labs  patient here with vaginal discharge for the past week. She's had some itching in the vaginal area especially after intercourse. She also had some mild pressure with urination and some back pain however she has not been sleeping very well and has a lot of stress on the job.   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 She has tried other sleep medications in the past and she is concerned that her lack of sleep is causing her more back pain and muscle aches during the day  Regarding her Sjorgens she was started on plaque with nail but was unable to tolerate this she asked for an ANA today however I've advised her followup with her rheumatologist   Constipation worsened over the past couple months she does take milk of magnesia or laxative as needed Review Of Systems: per above  GEN- denies fatigue, fever, weight loss,weakness, recent illness HEENT- denies eye drainage, change in vision, nasal discharge, CVS- denies chest pain, palpitations RESP- denies SOB, cough, wheeze ABD- denies N/V, +change in stools, abd pain GU- denies dysuria, hematuria, dribbling, incontinence MSK- +oint pain, muscle aches, injury Neuro- denies headache, dizziness, syncope, seizure activity       Objective:    BP 122/68  Pulse 64  Temp(Src) 97.6 F (36.4 C) (Oral)  Resp 18  Ht 5\' 3"  (1.6 m)  Wt 157 lb (71.215 kg)  BMI 27.82 kg/m2 GEN- NAD, alert and oriented x3 HEENT- PERRL, EOMI, non injected sclera, pink conjunctiva, MMM, oropharynx clear Neck- Supple, no thyromegaly CVS- RRR, no murmur RESP-CTAB ABD-NABS,soft,NT,ND, no CVA tenderness Psych- normal affect and mood, not  overly anxious GU- normal external genitalia, vaginal mucosa pink and moist,+, discharge, no uterus , no ovarian masses,          Assessment & Plan:      Problem List Items Addressed This Visit   None    Visit Diagnoses   Urinary pain    -  Primary    Relevant Orders       Urinalysis, Routine w reflex microscopic (Completed)    Vaginal discharge        Relevant Orders       WET PREP FOR [place], YEAST, CLUE (Completed)       GC/chlamydia probe amp, genital       Note: This dictation was prepared with Dragon dictation along with smaller phrase technology. Any transcriptional errors that result from this process are unintentional.

## 2014-03-21 NOTE — Assessment & Plan Note (Signed)
Discussed Miralax again, she agrees to try once a day

## 2014-03-21 NOTE — Addendum Note (Signed)
Addended by: Vic Blackbird F on: 03/21/2014 01:52 PM   Modules accepted: Orders

## 2014-03-21 NOTE — Assessment & Plan Note (Signed)
I think anxiety plays a lot into her multiple comorbidities, I am going to try ativan 0.5mg  at bedtime for her

## 2014-03-21 NOTE — Patient Instructions (Addendum)
Schedule a visit with your rheumatologist Take the ativan at bedtime  Take flagyl for BV Miralax 1 capfull for constipation  F/u as needed

## 2014-03-21 NOTE — Assessment & Plan Note (Signed)
Per above, has tried multiple medication Ativan at bedtime

## 2014-03-22 LAB — GC/CHLAMYDIA PROBE AMP
CT Probe RNA: NEGATIVE
GC Probe RNA: NEGATIVE

## 2014-03-29 ENCOUNTER — Encounter: Payer: Self-pay | Admitting: *Deleted

## 2014-04-02 ENCOUNTER — Encounter: Payer: Self-pay | Admitting: Family Medicine

## 2014-04-12 ENCOUNTER — Ambulatory Visit (INDEPENDENT_AMBULATORY_CARE_PROVIDER_SITE_OTHER): Payer: Managed Care, Other (non HMO) | Admitting: Otolaryngology

## 2014-04-12 DIAGNOSIS — H93293 Other abnormal auditory perceptions, bilateral: Secondary | ICD-10-CM

## 2014-04-12 DIAGNOSIS — J310 Chronic rhinitis: Secondary | ICD-10-CM

## 2014-04-17 ENCOUNTER — Telehealth: Payer: Self-pay | Admitting: Family Medicine

## 2014-04-17 MED ORDER — CLINDAMYCIN HCL 300 MG PO CAPS: 300.0000 mg | ORAL_CAPSULE | Freq: Two times a day (BID) | ORAL | Status: DC

## 2014-04-17 NOTE — Telephone Encounter (Signed)
Send in Clindamycin 300mg  BID x 7 days this is a different antibiotic, if she has recurrent symptoms with this then I would recommend referral to GYN- she can make her own appt

## 2014-04-17 NOTE — Telephone Encounter (Signed)
Patient would like a call back regarding her bacterial infection that she had and she dosent feel like it has gone completley, would like to know if we can call in another antibiotic if possible  [facility] [place]

## 2014-04-17 NOTE — Telephone Encounter (Signed)
Call placed to patient and patient made aware.   Prescription sent to pharmacy.  

## 2014-04-17 NOTE — Telephone Encounter (Signed)
Call placed to patient.   Reports that she continues to have painful intercourse and vaginal discharge with odor.   MD please advise.

## 2014-04-20 ENCOUNTER — Ambulatory Visit: Payer: Managed Care, Other (non HMO) | Admitting: Family Medicine

## 2014-04-29 ENCOUNTER — Encounter (HOSPITAL_COMMUNITY): Payer: Self-pay | Admitting: Emergency Medicine

## 2014-04-29 ENCOUNTER — Emergency Department (HOSPITAL_COMMUNITY): Payer: Managed Care, Other (non HMO)

## 2014-04-29 ENCOUNTER — Emergency Department (HOSPITAL_COMMUNITY)
Admission: EM | Admit: 2014-04-29 | Discharge: 2014-04-29 | Disposition: A | Discharge status: Home / Self Care | Payer: Managed Care, Other (non HMO) | Attending: Emergency Medicine | Admitting: Emergency Medicine

## 2014-04-29 DIAGNOSIS — Z9089 Acquired absence of other organs: Secondary | ICD-10-CM | POA: Insufficient documentation

## 2014-04-29 DIAGNOSIS — Z792 Long term (current) use of antibiotics: Secondary | ICD-10-CM | POA: Diagnosis not present

## 2014-04-29 DIAGNOSIS — Z7952 Long term (current) use of systemic steroids: Secondary | ICD-10-CM | POA: Insufficient documentation

## 2014-04-29 DIAGNOSIS — R102 Pelvic and perineal pain: Secondary | ICD-10-CM | POA: Diagnosis not present

## 2014-04-29 DIAGNOSIS — Z9889 Other specified postprocedural states: Secondary | ICD-10-CM | POA: Insufficient documentation

## 2014-04-29 DIAGNOSIS — Z79899 Other long term (current) drug therapy: Secondary | ICD-10-CM | POA: Insufficient documentation

## 2014-04-29 DIAGNOSIS — J45909 Unspecified asthma, uncomplicated: Secondary | ICD-10-CM | POA: Insufficient documentation

## 2014-04-29 DIAGNOSIS — Z791 Long term (current) use of non-steroidal anti-inflammatories (NSAID): Secondary | ICD-10-CM | POA: Insufficient documentation

## 2014-04-29 DIAGNOSIS — G8929 Other chronic pain: Secondary | ICD-10-CM | POA: Diagnosis not present

## 2014-04-29 DIAGNOSIS — K219 Gastro-esophageal reflux disease without esophagitis: Secondary | ICD-10-CM | POA: Diagnosis not present

## 2014-04-29 DIAGNOSIS — E785 Hyperlipidemia, unspecified: Secondary | ICD-10-CM | POA: Insufficient documentation

## 2014-04-29 DIAGNOSIS — Z862 Personal history of diseases of the blood and blood-forming organs and certain disorders involving the immune mechanism: Secondary | ICD-10-CM | POA: Insufficient documentation

## 2014-04-29 DIAGNOSIS — Z8742 Personal history of other diseases of the female genital tract: Secondary | ICD-10-CM | POA: Diagnosis not present

## 2014-04-29 DIAGNOSIS — F419 Anxiety disorder, unspecified: Secondary | ICD-10-CM | POA: Insufficient documentation

## 2014-04-29 LAB — HIV ANTIBODY (ROUTINE TESTING W REFLEX): HIV 1&2 Ab, 4th Generation: NONREACTIVE

## 2014-04-29 LAB — WET PREP, GENITAL
Trich, Wet Prep: NONE SEEN
Yeast Wet Prep HPF POC: NONE SEEN

## 2014-04-29 LAB — URINE MICROSCOPIC-ADD ON

## 2014-04-29 LAB — URINALYSIS, ROUTINE W REFLEX MICROSCOPIC
Bilirubin Urine: NEGATIVE
GLUCOSE, UA: NEGATIVE mg/dL
Ketones, ur: NEGATIVE mg/dL
Nitrite: NEGATIVE
PH: 5.5 (ref 5.0–8.0)
Protein, ur: NEGATIVE mg/dL
Specific Gravity, Urine: >1.03 — ABNORMAL HIGH (ref 1.005–1.030)
Urobilinogen, UA: 0.2 mg/dL (ref 0.0–1.0)

## 2014-04-29 LAB — RPR

## 2014-04-29 MED ORDER — METRONIDAZOLE 500 MG PO TABS: 500.0000 mg | ORAL_TABLET | Freq: Two times a day (BID) | ORAL | Status: DC

## 2014-04-29 MED ORDER — CEFTRIAXONE SODIUM 250 MG IJ SOLR
250.0000 mg | Freq: Once | INTRAMUSCULAR | Status: AC
  Administered 2014-04-29: 250 mg via INTRAMUSCULAR
  Filled 2014-04-29: qty 250

## 2014-04-29 MED ORDER — DOXYCYCLINE HYCLATE 100 MG PO TABS
100.0000 mg | ORAL_TABLET | Freq: Once | ORAL | Status: AC
  Administered 2014-04-29: 100 mg via ORAL
  Filled 2014-04-29: qty 1

## 2014-04-29 MED ORDER — DOXYCYCLINE HYCLATE 100 MG PO CAPS: 100.0000 mg | ORAL_CAPSULE | Freq: Two times a day (BID) | ORAL | Status: DC

## 2014-04-29 MED ORDER — STERILE WATER FOR INJECTION IJ SOLN
INTRAMUSCULAR | Status: AC
  Administered 2014-04-29: 2.1 mL
  Filled 2014-04-29: qty 10

## 2014-04-29 MED ORDER — IBUPROFEN 800 MG PO TABS
800.0000 mg | ORAL_TABLET | Freq: Once | ORAL | Status: AC
  Administered 2014-04-29: 800 mg via ORAL
  Filled 2014-04-29: qty 1

## 2014-04-29 MED ORDER — IBUPROFEN 800 MG PO TABS: 800.0000 mg | ORAL_TABLET | Freq: Three times a day (TID) | ORAL | Status: DC | PRN

## 2014-04-29 NOTE — ED Provider Notes (Signed)
CSN: [id number]     Arrival date & time 04/29/14  0710 History  This chart was scribed for Orlie Dakin, MD by [facility], ED Scribe. The patient was seen in [facility] and the patient's care was started at 7:40 AM.   Chief Complaint  Patient presents with  . Pelvic Pain   The history is provided by the patient. No language interpreter was used.   HPI Comments: Yvette Mayer is a 43 y.o. female who presents to the Emergency Department complaining of lower right sided back pain. Her vagina is irritated and inflammed which started 4 weeks ago. Pt states she could not sleep this week so she came into the ED. She has dysuria and discomfort with intercourse as associated symptoms. Pt has tried ibuprophen which has provided no relief. Pt was treated for bacterial vaginosis 4 weeks ago and given flagyl.  Her last BM was 2 days ago, he describes it as "hard". She has been eating ok and she has had a cheeseburger yesterday. Pain worse with sexual intercourse and with urination. No other associated symptoms. No fever. Pt uses eye drops but not other medications. PT does not smoke, drink  EtOH, or use illicit drugs. She denies fever, vomiting, and vaginal discharge.  Past Medical History  Diagnosis Date  . Anemia     iron 1 yr ago, normal hgb 7/12  . Sinus drainage   . Anxiety   . Hyperlipidemia   . Shortness of breath   . GERD (gastroesophageal reflux disease)   . Ovarian cyst, right   . Fibromyalgia   . Allergy   . TMJ (dislocation of temporomandibular joint)   . Chronic sinusitis   . Vertigo   . Chronic headaches   . Sjoegren syndrome   . Asthma     pt says no-bronchitis  . Chronic neck pain   . Chronic back pain   . Paresthesias    Past Surgical History  Procedure Laterality Date  . Partial hysterectomy  93  . Cesarean section      x2  . Ovarian cyst removal  92 removal of cysts from behind ovaries  . Cholecystectomy  2005    cholelithiasis  . Tonsillectomy      2010   . Septoplasty      2011  . Esophagogastroduodenoscopy  01/23/2011    Procedure: ESOPHAGOGASTRODUODENOSCOPY (EGD);  Surgeon: Yvette Dolin, MD;  [facility];  Service: Endoscopy;  Laterality: N/A;  9:00am  . Maloney dilation  01/23/2011    Procedure: Yvette Mayer DILATION;  Surgeon: Yvette Dolin, MD;  Location: [facility];  Service: Endoscopy;  Laterality: N/A;  . Upper gastrointestinal endoscopy    . Abdominal hysterectomy    . Temporomandibular joint surgery    . Turbinate resection Bilateral 05/29/2013    Procedure: TURBINATE RESECTION;  Surgeon: Yvette Dike, MD;  Location: [facility];  Service: ENT;  Laterality: Bilateral;   Family History  Problem Relation Age of Onset  . Diabetes Father   . Hypertension Mother   . Anesthesia problems Neg Hx   . Malignant hyperthermia Neg Hx   . Hypotension Neg Hx   . Pseudochol deficiency Neg Hx    History  Substance Use Topics  . Smoking status: Never Smoker   . Smokeless tobacco: Never Used  . Alcohol Use: No   OB History    Gravida Para Term Preterm AB TAB SAB Ectopic Multiple Living   4 3 2  1  1  1   3      Review of Systems  Constitutional: Negative.  Negative for fatigue.  HENT: Negative.   Respiratory: Negative.   Gastrointestinal: Negative.  Negative for vomiting.  Genitourinary: Positive for dysuria. Negative for vaginal discharge.  Musculoskeletal: Positive for back pain.  Skin: Negative.   Allergic/Immunologic: Negative.   Neurological: Negative.   Psychiatric/Behavioral: Negative.   All other systems reviewed and are negative.  Allergies  Hydrocodone-acetaminophen and Percocet  Home Medications   Prior to Admission medications   Medication Sig Start Date End Date Taking? Authorizing Provider  clindamycin (CLEOCIN) 300 MG capsule Take 1 capsule (300 mg total) by mouth 2 (two) times daily. 04/17/14   Yvette Rossetti, MD  cyclobenzaprine (FLEXERIL) 5 MG tablet Take 1 tablet (5 mg total) by  mouth at bedtime. 03/21/14   Yvette Rossetti, MD  cycloSPORINE (RESTASIS) 0.05 % ophthalmic emulsion Place 1 drop into both eyes 2 (two) times daily.    Historical Provider, MD  dexlansoprazole (DEXILANT) 60 MG capsule Take 1 capsule (60 mg total) by mouth daily. 10/18/13   Yvette Rossetti, MD  fluconazole (DIFLUCAN) 150 MG tablet Take 1 tablet (150 mg total) by mouth daily. 03/21/14   Yvette Rossetti, MD  LORazepam (ATIVAN) 0.5 MG tablet Take 1 tablet (0.5 mg total) by mouth at bedtime as needed for anxiety. 03/21/14   Yvette Rossetti, MD  metroNIDAZOLE (FLAGYL) 500 MG tablet Take 1 tablet (500 mg total) by mouth 2 (two) times daily. 03/21/14   Yvette Rossetti, MD  naproxen (NAPROSYN) 500 MG tablet Take 1 tablet (500 mg total) by mouth 2 (two) times daily. 08/09/13   Yvette Rossetti, MD  oxyCODONE-acetaminophen (ROXICET) 5-325 MG per tablet Take 1 tablet by mouth every 6 (six) hours as needed for severe pain. 01/08/14   Yvette Rossetti, MD  pilocarpine (SALAGEN) 5 MG tablet Take by mouth 3 (three) times daily.    Historical Provider, MD  triamcinolone cream (KENALOG) 0.1 % Apply 1 application topically 2 (two) times daily. 12/26/13   Yvette Rossetti, MD   There were no vitals taken for this visit. Physical Exam  Constitutional: She appears well-developed and well-nourished.  HENT:  Head: Normocephalic and atraumatic.  Eyes: Conjunctivae are normal. Pupils are equal, round, and reactive to light.  Neck: Neck supple. No tracheal deviation present. No thyromegaly present.  Cardiovascular: Normal rate and regular rhythm.   No murmur heard. Pulmonary/Chest: Effort normal and breath sounds normal.  Abdominal: Soft. Bowel sounds are normal. She exhibits no distension. There is no tenderness.  Genitourinary:  No external lesion. beige vaginal discharge cervix is absent. She is tender at vagina. No adnexal tenderness or masses  Musculoskeletal: Normal range of motion. She exhibits no edema or  tenderness.  Neurological: She is alert. Coordination normal.  Skin: Skin is warm and dry. No rash noted.  Psychiatric: She has a normal mood and affect.  Nursing note and vitals reviewed.   ED Course  Procedures  DIAGNOSTIC STUDIES: Oxygen Saturation is 96% on room air, adequate by my interpretation.    COORDINATION OF CARE: 7:45 AM Discussed treatment plan with pt at bedside and pt agreed to plan.   Labs Review Labs Reviewed - No data to display  Imaging Review No results found.   EKG Interpretation None     12:10 PM Patient's pain is well-controlled treatment with ibuprofen Results for orders placed or performed during the hospital encounter of 04/29/14  Wet prep, genital  Result Value Ref Range   Yeast Wet Prep HPF POC NONE SEEN NONE SEEN   Trich, Wet Prep NONE SEEN NONE SEEN   Clue Cells Wet Prep HPF POC MANY (A) NONE SEEN   WBC, Wet Prep HPF POC MANY (A) NONE SEEN  Urinalysis, Routine w reflex microscopic  Result Value Ref Range   Color, Urine YELLOW YELLOW   APPearance CLEAR CLEAR   Specific Gravity, Urine >1.030 (H) 1.005 - 1.030   pH 5.5 5.0 - 8.0   Glucose, UA NEGATIVE NEGATIVE mg/dL   Hgb urine dipstick SMALL (A) NEGATIVE   Bilirubin Urine NEGATIVE NEGATIVE   Ketones, ur NEGATIVE NEGATIVE mg/dL   Protein, ur NEGATIVE NEGATIVE mg/dL   Urobilinogen, UA 0.2 0.0 - 1.0 mg/dL   Nitrite NEGATIVE NEGATIVE   Leukocytes, UA SMALL (A) NEGATIVE  Urine microscopic-add on  Result Value Ref Range   Squamous Epithelial / LPF MANY (A) RARE   WBC, UA 7-10 <3 WBC/hpf   RBC / HPF 3-6 <3 RBC/hpf   Bacteria, UA MANY (A) RARE   US Transvaginal Non-ob  04/29/2014   CLINICAL DATA:  Pelvic pain  EXAM: TRANSABDOMINAL AND TRANSVAGINAL ULTRASOUND OF PELVIS  TECHNIQUE: Both transabdominal and transvaginal ultrasound examinations of the pelvis were performed. Transabdominal technique was performed for global imaging of the pelvis including uterus, ovaries, adnexal regions, and  pelvic cul-de-sac. It was necessary to proceed with endovaginal exam following the transabdominal exam to visualize the right ovary and left adnexa.  COMPARISON:  None  FINDINGS: Uterus  Surgically absent.  Right ovary  Measurements: 2.3 x 1.7 x 2.0 cm. Normal appearance/no adnexal mass.  Left ovary  Not discretely visualized.  Other findings  Small volume pelvic ascites.  IMPRESSION: Status post hysterectomy.  Right ovary is within normal limits.  Left ovary is not discretely visualized.   Electronically Signed   By: Yvette Mayer M.D.   On: 04/29/2014 10:46   US Pelvis Complete  04/29/2014   CLINICAL DATA:  Pelvic pain  EXAM: TRANSABDOMINAL AND TRANSVAGINAL ULTRASOUND OF PELVIS  TECHNIQUE: Both transabdominal and transvaginal ultrasound examinations of the pelvis were performed. Transabdominal technique was performed for global imaging of the pelvis including uterus, ovaries, adnexal regions, and pelvic cul-de-sac. It was necessary to proceed with endovaginal exam following the transabdominal exam to visualize the right ovary and left adnexa.  COMPARISON:  None  FINDINGS: Uterus  Surgically absent.  Right ovary  Measurements: 2.3 x 1.7 x 2.0 cm. Normal appearance/no adnexal mass.  Left ovary  Not discretely visualized.  Other findings  Small volume pelvic ascites.  IMPRESSION: Status post hysterectomy.  Right ovary is within normal limits.  Left ovary is not discretely visualized.   Electronically Signed   By: Yvette Mayer M.D.   On: 04/29/2014 10:46    MDM  PLan treatment with ceftriaxone IM. Prescriptions for Flagyl, ibuprofen, doxycycline. Referral Dr.Eure Final diagnoses:  None      Diagnosis #1 pelvic pain #2 vaginitis    Orlie Dakin, MD 04/29/14 1221

## 2014-04-29 NOTE — ED Notes (Signed)
Patient with c/o right flank, lower back and pelvic pressure x 1 week. States painful urination, painful intercourse. States 1 partner (husband), partial hysterectomy. States treated 1 month ago for bacterial vaginosis.

## 2014-04-29 NOTE — Discharge Instructions (Signed)
Pelvic Pain Call Dr. Brynda [place]athouse office tomorrow to schedule next available appointment Female pelvic pain can be caused by many different things and start from a variety of places. Pelvic pain refers to pain that is located in the lower half of the abdomen and between your hips. The pain may occur over a short period of time (acute) or may be reoccurring (chronic). The cause of pelvic pain may be related to disorders affecting the female reproductive organs (gynecologic), but it may also be related to the bladder, kidney stones, an intestinal complication, or muscle or skeletal problems. Getting help right away for pelvic pain is important, especially if there has been severe, sharp, or a sudden onset of unusual pain. It is also important to get help right away because some types of pelvic pain can be life threatening.  CAUSES  Below are only some of the causes of pelvic pain. The causes of pelvic pain can be in one of several categories.   Gynecologic.  Pelvic inflammatory disease.  Sexually transmitted infection.  Ovarian cyst or a twisted ovarian ligament (ovarian torsion).  Uterine lining that grows outside the uterus (endometriosis).  Fibroids, cysts, or tumors.  Ovulation.  Pregnancy.  Pregnancy that occurs outside the uterus (ectopic pregnancy).  Miscarriage.  Labor.  Abruption of the placenta or ruptured uterus.  Infection.  Uterine infection (endometritis).  Bladder infection.  Diverticulitis.  Miscarriage related to a uterine infection (septic abortion).  Bladder.  Inflammation of the bladder (cystitis).  Kidney stone(s).  Gastrointestinal.  Constipation.  Diverticulitis.  Neurologic.  Trauma.  Feeling pelvic pain because of mental or emotional causes (psychosomatic).  Cancers of the bowel or pelvis. EVALUATION  Your caregiver will want to take a careful history of your concerns. This includes recent changes in your health, a careful gynecologic  history of your periods (menses), and a sexual history. Obtaining your family history and medical history is also important. Your caregiver may suggest a pelvic exam. A pelvic exam will help identify the location and severity of the pain. It also helps in the evaluation of which organ system may be involved. In order to identify the cause of the pelvic pain and be properly treated, your caregiver may order tests. These tests may include:   A pregnancy test.  Pelvic ultrasonography.  An X-ray exam of the abdomen.  A urinalysis or evaluation of vaginal discharge.  Blood tests. HOME CARE INSTRUCTIONS   Only take over-the-counter or prescription medicines for pain, discomfort, or fever as directed by your caregiver.   Rest as directed by your caregiver.   Eat a balanced diet.   Drink enough fluids to make your urine clear or pale yellow, or as directed.   Avoid sexual intercourse if it causes pain.   Apply warm or cold compresses to the lower abdomen depending on which one helps the pain.   Avoid stressful situations.   Keep a journal of your pelvic pain. Write down when it started, where the pain is located, and if there are things that seem to be associated with the pain, such as food or your menstrual cycle.  Follow up with your caregiver as directed.  SEEK MEDICAL CARE IF:  Your medicine does not help your pain.  You have abnormal vaginal discharge. SEEK IMMEDIATE MEDICAL CARE IF:   You have heavy bleeding from the vagina.   Your pelvic pain increases.   You feel light-headed or faint.   You have chills.   You have pain with urination or  blood in your urine.   You have uncontrolled diarrhea or vomiting.   You have a fever or persistent symptoms for more than 3 days.  You have a fever and your symptoms suddenly get worse.   You are being physically or sexually abused.  MAKE SURE YOU:  Understand these instructions.  Will watch your  condition.  Will get help if you are not doing well or get worse. Document Released: 04/14/2004 Document Revised: 10/02/2013 Document Reviewed: 09/07/2011 [facility] Patient Information 2015 [place], [place]. This information is not intended to replace advice given to you by your health care provider. Make sure you discuss any questions you have with your health care provider.

## 2014-05-01 ENCOUNTER — Ambulatory Visit: Payer: Managed Care, Other (non HMO) | Admitting: Family Medicine

## 2014-05-01 LAB — GC/CHLAMYDIA PROBE AMP
CT Probe RNA: NEGATIVE
GC Probe RNA: NEGATIVE

## 2014-05-07 ENCOUNTER — Ambulatory Visit: Payer: Managed Care, Other (non HMO) | Admitting: Adult Health

## 2014-05-07 ENCOUNTER — Encounter: Payer: Self-pay | Admitting: Adult Health

## 2014-05-31 ENCOUNTER — Encounter: Payer: Managed Care, Other (non HMO) | Admitting: Obstetrics & Gynecology

## 2014-06-11 ENCOUNTER — Ambulatory Visit: Payer: Managed Care, Other (non HMO) | Admitting: Family Medicine

## 2014-06-12 ENCOUNTER — Encounter: Payer: Self-pay | Admitting: Adult Health

## 2014-06-12 ENCOUNTER — Ambulatory Visit (INDEPENDENT_AMBULATORY_CARE_PROVIDER_SITE_OTHER): Payer: Managed Care, Other (non HMO) | Admitting: Adult Health

## 2014-06-12 VITALS — BP 98/52 | Ht 64.0 in | Wt 161.5 lb

## 2014-06-12 DIAGNOSIS — R1084 Generalized abdominal pain: Secondary | ICD-10-CM

## 2014-06-12 DIAGNOSIS — R109 Unspecified abdominal pain: Secondary | ICD-10-CM

## 2014-06-12 HISTORY — DX: Unspecified abdominal pain: R10.9

## 2014-06-12 MED ORDER — DICYCLOMINE HCL 10 MG PO CAPS: 10.0000 mg | ORAL_CAPSULE | Freq: Three times a day (TID) | ORAL | Status: DC

## 2014-06-12 MED ORDER — OMEPRAZOLE 20 MG PO CPDR: 20.0000 mg | DELAYED_RELEASE_CAPSULE | Freq: Every day | ORAL | Status: DC

## 2014-06-12 NOTE — Progress Notes (Signed)
Subjective:     [name], female   DOB: 03-08-1971, 44 y.o.   MRN: [id number]  HPI Yvette Mayer is a 44 year old black female in complaining of pain in abdomin on and off since October,was seen in ER and had [place] that was normal in November.She has had EGD in past and she says she has small hiatal hernia.She is constipated at times and will have diarrhea at times.She says if she eats [place] and drinks sweet tea she feels worse and has discomfort in chest area.She had her GB removed years ago.She complains of being gassy at times.  Review of Systems See HPI Reviewed past medical,surgical, social and family history. Reviewed medications and allergies.     Objective:   Physical Exam BP 98/52 mmHg  Ht 5\' 4"  (1.626 m)  Wt 161 lb 8 oz (73.256 kg)  BMI 27.71 kg/m2   Skin warm and dry.Pelvic: external genitalia is normal in appearance, vagina: white discharge without odor, cervix and uterus are absent, adnexa: no masses or tenderness noted, today, on abdominal exam no tenderness today or HSM noted.I told her this could be IBS or even ulcer, and would try meds and if not better will refer to GI,she has seen Dr Oneida Alar in past.  Assessment:     Abdominal pain    Plan:     Rx prilosec 20 mg #30 1 daily with 1 refill Rx bentyl 10 mg 1 tid ac #90 with 1 refill Return in 2 weeks for follow up, if persists will check H pylori and refer to GI Review handout on IBS

## 2014-06-12 NOTE — Patient Instructions (Signed)
Irritable Bowel Syndrome Irritable bowel syndrome (IBS) is caused by a disturbance of normal bowel function and is a common digestive disorder. You may also hear this condition called spastic colon, mucous colitis, and irritable colon. There is no cure for IBS. However, symptoms often gradually improve or disappear with a good diet, stress management, and medicine. This condition usually appears in late adolescence or early adulthood. Women develop it twice as often as men. CAUSES  After food has been digested and absorbed in the small intestine, waste material is moved into the large intestine, or colon. In the colon, water and salts are absorbed from the undigested products coming from the small intestine. The remaining residue, or fecal material, is held for elimination. Under normal circumstances, gentle, rhythmic contractions of the bowel walls push the fecal material along the colon toward the rectum. In IBS, however, these contractions are irregular and poorly coordinated. The fecal material is either retained too long, resulting in constipation, or expelled too soon, producing diarrhea. SIGNS AND SYMPTOMS  The most common symptom of IBS is abdominal pain. It is often in the lower left side of the abdomen, but it may occur anywhere in the abdomen. The pain comes from spasms of the bowel muscles happening too much and from the buildup of gas and fecal material in the colon. This pain:  Can range from sharp abdominal cramps to a dull, continuous ache.  Often worsens soon after eating.  Is often relieved by having a bowel movement or passing gas. Abdominal pain is usually accompanied by constipation, but it may also produce diarrhea. The diarrhea often occurs right after a meal or upon waking up in the morning. The stools are often soft, watery, and flecked with mucus. Other symptoms of IBS include:  Bloating.  Loss of appetite.  Heartburn.  Backache.  Dull pain in the arms or  shoulders.  Nausea.  Burping.  Vomiting.  Gas. IBS may also cause symptoms that are unrelated to the digestive system, such as:  Fatigue.  Headaches.  Anxiety.  Shortness of breath.  Trouble concentrating.  Dizziness. These symptoms tend to come and go. DIAGNOSIS  The symptoms of IBS may seem like symptoms of other, more serious digestive disorders. Your health care provider may want to perform tests to exclude these disorders.  TREATMENT Many medicines are available to help correct bowel function or relieve bowel spasms and abdominal pain. Among the medicines available are:  Laxatives for severe constipation and to help restore normal bowel habits.  Specific antidiarrheal medicines to treat severe or lasting diarrhea.  Antispasmodic agents to relieve intestinal cramps. Your health care provider may also decide to treat you with a mild tranquilizer or sedative during unusually stressful periods in your life. Your health care provider may also prescribe antidepressant medicine. The use of this medicine has been shown to reduce pain and other symptoms of IBS. Remember that if any medicine is prescribed for you, you should take it exactly as directed. Make sure your health care provider knows how well it worked for you. HOME CARE INSTRUCTIONS   Take all medicines as directed by your health care provider.  Avoid foods that are high in fat or oils, such as heavy cream, butter, frankfurters, sausage, and other fatty meats.  Avoid foods that make you go to the bathroom, such as fruit, fruit juice, and dairy products.  Cut out carbonated drinks, chewing gum, and "gassy" foods such as beans and cabbage. This may help relieve bloating and burping.    Eat foods with bran, and drink plenty of liquids with the bran foods. This helps relieve constipation.  Keep track of what foods seem to bring on your symptoms.  Avoid emotionally charged situations or circumstances that produce  anxiety.  Start or continue exercising.  Get plenty of rest and sleep. Document Released: 05/18/2005 Document Revised: 05/23/2013 Document Reviewed: 01/06/2008 [facility] Patient Information 2015 [place], [place]. This information is not intended to replace advice given to you by your health care provider. Make sure you discuss any questions you have with your health care provider. Take meds Follow up in 2 weeks

## 2014-06-13 ENCOUNTER — Ambulatory Visit: Payer: Managed Care, Other (non HMO) | Admitting: Family Medicine

## 2014-06-15 ENCOUNTER — Ambulatory Visit (INDEPENDENT_AMBULATORY_CARE_PROVIDER_SITE_OTHER): Payer: Managed Care, Other (non HMO) | Admitting: Family Medicine

## 2014-06-15 ENCOUNTER — Encounter: Payer: Self-pay | Admitting: Family Medicine

## 2014-06-15 VITALS — BP 130/74 | HR 72 | Temp 97.6°F | Resp 16 | Ht 64.0 in | Wt 161.0 lb

## 2014-06-15 DIAGNOSIS — K219 Gastro-esophageal reflux disease without esophagitis: Secondary | ICD-10-CM

## 2014-06-15 DIAGNOSIS — E785 Hyperlipidemia, unspecified: Secondary | ICD-10-CM

## 2014-06-15 NOTE — Patient Instructions (Addendum)
Okay to take prilosec Okay to use the bentyl Referral to GI Cholesterol much improved F/U 6 months

## 2014-06-15 NOTE — Assessment & Plan Note (Signed)
Lipids improved, keep working on diet, no meds needed Scan results

## 2014-06-15 NOTE — Assessment & Plan Note (Signed)
Currently on PPI, if no improvement would just refer back to GI, though her story continues to change about what is actually causing discomfort

## 2014-06-15 NOTE — Progress Notes (Signed)
[name], female   DOB: 02-Oct-1970, 44 y.o.   MRN: [id number]   Subjective:    [name], female    DOB: 02-Feb-1971, 44 y.o.   MRN: [id number]  Patient presents for Chlesterol Check and IBS  patient here to review her wellness labs. Of note she was sent to GYN because she kept complaining of pelvic discomfort and vaginal discharge however when she got there she told them she was having abdominal pain and has had some loose stool as well as constipation therefore they were concerned for urine or bowel syndrome and the pelvic workup was negative. She was started on Bentyl and restarted back on a PPI which of note she discontinued on her own a few months back. She states that her stomach is a little bit better it is been about 2 days and she has been on this medicine. I did review her wellness labs and her cholesterol has improved from previous check    Review Of Systems:  GEN- denies fatigue, fever, weight loss,weakness, recent illness HEENT- denies eye drainage, change in vision, nasal discharge, CVS- denies chest pain, palpitations RESP- denies SOB, cough, wheeze ABD- denies N/V, change in stools, +abd pain GU- denies dysuria, hematuria, dribbling, incontinence MSK- denies joint pain, muscle aches, injury Neuro- denies headache, dizziness, syncope, seizure activity       Objective:    BP 130/74 mmHg  Pulse 72  Temp(Src) 97.6 F (36.4 C) (Oral)  Resp 16  Ht 5\' 4"  (1.626 m)  Wt 161 lb (73.029 kg)  BMI 27.62 kg/m2 GEN- NAD, alert and oriented x3 CVS- RRR, no murmur RESP-CTAB ABD-NABS,soft,NT,ND EXT- No edema Pulses- Radial2+        Assessment & Plan:      Problem List Items Addressed This Visit      Unprioritized   Hyperlipidemia   GERD (gastroesophageal reflux disease) - Primary      Note: This dictation was prepared with Dragon dictation along with smaller phrase technology. Any transcriptional errors that result from this  process are unintentional.

## 2014-06-25 ENCOUNTER — Ambulatory Visit: Payer: Managed Care, Other (non HMO) | Admitting: Adult Health

## 2014-06-27 ENCOUNTER — Encounter: Payer: Self-pay | Admitting: Adult Health

## 2014-06-27 ENCOUNTER — Ambulatory Visit: Payer: Managed Care, Other (non HMO) | Admitting: Adult Health

## 2014-07-05 ENCOUNTER — Ambulatory Visit: Payer: Managed Care, Other (non HMO) | Admitting: Adult Health

## 2014-07-21 ENCOUNTER — Emergency Department (HOSPITAL_COMMUNITY)
Admission: EM | Admit: 2014-07-21 | Discharge: 2014-07-21 | Disposition: A | Discharge status: Home / Self Care | Payer: Managed Care, Other (non HMO) | Attending: Emergency Medicine | Admitting: Emergency Medicine

## 2014-07-21 ENCOUNTER — Encounter (HOSPITAL_COMMUNITY): Payer: Self-pay | Admitting: Emergency Medicine

## 2014-07-21 DIAGNOSIS — Z862 Personal history of diseases of the blood and blood-forming organs and certain disorders involving the immune mechanism: Secondary | ICD-10-CM | POA: Insufficient documentation

## 2014-07-21 DIAGNOSIS — G8929 Other chronic pain: Secondary | ICD-10-CM | POA: Insufficient documentation

## 2014-07-21 DIAGNOSIS — J45909 Unspecified asthma, uncomplicated: Secondary | ICD-10-CM | POA: Insufficient documentation

## 2014-07-21 DIAGNOSIS — J069 Acute upper respiratory infection, unspecified: Secondary | ICD-10-CM | POA: Diagnosis not present

## 2014-07-21 DIAGNOSIS — Z79899 Other long term (current) drug therapy: Secondary | ICD-10-CM | POA: Diagnosis not present

## 2014-07-21 DIAGNOSIS — K219 Gastro-esophageal reflux disease without esophagitis: Secondary | ICD-10-CM | POA: Insufficient documentation

## 2014-07-21 DIAGNOSIS — Z8659 Personal history of other mental and behavioral disorders: Secondary | ICD-10-CM | POA: Diagnosis not present

## 2014-07-21 DIAGNOSIS — Z8742 Personal history of other diseases of the female genital tract: Secondary | ICD-10-CM | POA: Insufficient documentation

## 2014-07-21 DIAGNOSIS — M549 Dorsalgia, unspecified: Secondary | ICD-10-CM | POA: Insufficient documentation

## 2014-07-21 DIAGNOSIS — J029 Acute pharyngitis, unspecified: Secondary | ICD-10-CM | POA: Diagnosis present

## 2014-07-21 NOTE — ED Provider Notes (Signed)
CSN: [id number]     Arrival date & time 07/21/14  0722 History  This chart was scribed for Richarda Blade, MD by Jeanell Sparrow, ED Scribe. This patient was seen in room APA12/APA12 and the patient's care was started at 8:03 AM.   Chief Complaint  Patient presents with  . Back Pain  . Sore Throat   The history is provided by the patient. No language interpreter was used.  HPI Comments: Yvette Mayer is a 44 y.o. female who presents to the Emergency Department complaining of constant moderate back pain that started about a week ago. She states that she has been sick for about a week and her entire back hurts with a sore throat, sneezing, and cough. She states that she has also been having dry eye and mild bilateral ear pain. She reports that her symptoms have been getting worse. She states that she has been taking ibuprofen and motrin for the back pain. She reports that she has been taking theraflu, nyquil, and muccinex for her other symptoms. She denies any rhinorrhea, dysuria, or urinary frequency.   Past Medical History  Diagnosis Date  . Anemia     iron 1 yr ago, normal hgb 7/12  . Sinus drainage   . Anxiety   . Hyperlipidemia   . Shortness of breath   . GERD (gastroesophageal reflux disease)   . Ovarian cyst, right   . Fibromyalgia   . Allergy   . TMJ (dislocation of temporomandibular joint)   . Chronic sinusitis   . Vertigo   . Chronic headaches   . Sjoegren syndrome   . Asthma     pt says no-bronchitis  . Chronic neck pain   . Chronic back pain   . Paresthesias   . Mental disorder   . Abdominal pain 06/12/2014   Past Surgical History  Procedure Laterality Date  . Partial hysterectomy  93  . Cesarean section      x2  . Ovarian cyst removal  92 removal of cysts from behind ovaries  . Cholecystectomy  2005    cholelithiasis  . Tonsillectomy      2010  . Septoplasty      2011  . Esophagogastroduodenoscopy  01/23/2011    Procedure: ESOPHAGOGASTRODUODENOSCOPY (EGD);   Surgeon: Daneil Dolin, MD;  Location: [facility];  Service: Endoscopy;  Laterality: N/A;  9:00am  . Maloney dilation  01/23/2011    Procedure: Venia Minks DILATION;  Surgeon: Daneil Dolin, MD;  Location: [facility];  Service: Endoscopy;  Laterality: N/A;  . Upper gastrointestinal endoscopy    . Abdominal hysterectomy    . Temporomandibular joint surgery    . Turbinate resection Bilateral 05/29/2013    Procedure: TURBINATE RESECTION;  Surgeon: Ascencion Dike, MD;  Location: [facility];  Service: ENT;  Laterality: Bilateral;   Family History  Problem Relation Age of Onset  . Diabetes Father   . Hypertension Mother   . Anesthesia problems Neg Hx   . Malignant hyperthermia Neg Hx   . Hypotension Neg Hx   . Pseudochol deficiency Neg Hx   . Thyroid disease Sister     overactive  . Bronchitis Son   . Heart disease Maternal Grandmother   . Hypertension Maternal Grandmother   . Alcohol abuse Maternal Grandfather   . Scoliosis Son    History  Substance Use Topics  . Smoking status: Never Smoker   . Smokeless tobacco: Never Used  . Alcohol Use: No  OB History    Gravida Para Term Preterm AB TAB SAB Ectopic Multiple Living   4 3 2 1 1  1   3      Review of Systems  HENT: Positive for sore throat. Negative for rhinorrhea.   Respiratory: Positive for cough.   Genitourinary: Negative for dysuria and frequency.  Musculoskeletal: Positive for back pain.  All other systems reviewed and are negative.  Allergies  Hydrocodone-acetaminophen and Percocet  Home Medications   Prior to Admission medications   Medication Sig Start Date End Date Taking? Authorizing Provider  dicyclomine (BENTYL) 10 MG capsule Take 1 capsule (10 mg total) by mouth 3 (three) times daily before meals. Patient not taking: Reported on 06/15/2014 06/12/14   Estill Dooms, NP  hydroxychloroquine (PLAQUENIL) 200 MG tablet Take 200 mg by mouth daily.    Historical Provider, MD  ibuprofen  (ADVIL,MOTRIN) 800 MG tablet Take 1 tablet (800 mg total) by mouth every 8 (eight) hours as needed for moderate pain. 04/29/14   Orlie Dakin, MD  omeprazole (PRILOSEC) 20 MG capsule Take 1 capsule (20 mg total) by mouth daily. 06/12/14   Estill Dooms, NP   BP 113/66 mmHg  Pulse 59  Temp(Src) 97.6 F (36.4 C) (Oral)  Resp 18  Ht 5\' 5"  (1.651 m)  Wt 154 lb (69.854 kg)  BMI 25.63 kg/m2  SpO2 100% Physical Exam  Constitutional: She is oriented to person, place, and time. She appears well-developed and well-nourished.  HENT:  Head: Normocephalic and atraumatic.  Right Ear: Tympanic membrane normal.  Left Ear: Tympanic membrane normal.  Mouth/Throat: Oropharynx is clear and moist. No oropharyngeal exudate.  Eyes: Conjunctivae and EOM are normal. Pupils are equal, round, and reactive to light.  Neck: Normal range of motion and phonation normal. Neck supple.  Anterior neck TTP with no deformity or adenopathy.   Cardiovascular: Normal rate and regular rhythm.   Pulmonary/Chest: Effort normal and breath sounds normal. She exhibits no tenderness.  Abdominal: Soft. She exhibits no distension. There is no tenderness. There is no guarding.  Musculoskeletal: Normal range of motion.  Mild diffuse back TTP. No spine deformities.   Lymphadenopathy:    She has no cervical adenopathy.  Neurological: She is alert and oriented to person, place, and time. She exhibits normal muscle tone.  Skin: Skin is warm and dry.  Psychiatric: She has a normal mood and affect. Her behavior is normal. Judgment and thought content normal.  Nursing note and vitals reviewed.   ED Course  Procedures (including critical care time) DIAGNOSTIC STUDIES: Oxygen Saturation is 100% on RA, normal by my interpretation.    COORDINATION OF CARE: 8:07 AM- Pt advised of plan for treatment and pt agrees.  Labs Review Labs Reviewed - No data to display  Imaging Review No results found.   EKG Interpretation None       MDM   Final diagnoses:  URI (upper respiratory infection)    Evaluation is consistent with upper respiratory infection.  Doubt pneumonia, serious bacterial infection or metabolic instability.    Nursing Notes Reviewed/ Care Coordinated Applicable Imaging Reviewed Interpretation of Laboratory Data incorporated into ED treatment  The patient appears reasonably screened and/or stabilized for discharge and I doubt any other medical condition or other [facility] requiring further screening, evaluation, or treatment in the ED at this time prior to discharge.  Plan: Home Medications- OTC for sx relief, prn; Home Treatments- rest; return here if the recommended treatment, does not improve the symptoms; Recommended  follow up- PCP prn   I personally performed the services described in this documentation, which was scribed in my presence. The recorded information has been reviewed and is accurate.       Richarda Blade, MD 07/21/14 [phone]

## 2014-07-21 NOTE — ED Notes (Signed)
Patient c/o sore throat, dry hacky cough (worse at night), and back pain (from neck to lower back) x1 week. Per patient getting progressively worse. Denies any fevers. Patient reports pain with swallowing but denies any difficulty breathing- able to handle oral secretions.

## 2014-07-21 NOTE — ED Notes (Signed)
MD at bedside. 

## 2014-07-21 NOTE — Discharge Instructions (Signed)
Your symptoms are either a prolonged respiratory viral illness or allergies. Potential treatments to speed recovery and improve symptoms are Tylenol or Motrin for pain, and Claritin for congestion or allergy symptoms.   Upper Respiratory Infection, Adult An upper respiratory infection (URI) is also sometimes known as the common cold. The upper respiratory tract includes the nose, sinuses, throat, trachea, and bronchi. Bronchi are the airways leading to the lungs. Most people improve within 1 week, but symptoms can last up to 2 weeks. A residual cough may last even longer.  CAUSES Many different viruses can infect the tissues lining the upper respiratory tract. The tissues become irritated and inflamed and often become very moist. Mucus production is also common. A cold is contagious. You can easily spread the virus to others by oral contact. This includes kissing, sharing a glass, coughing, or sneezing. Touching your mouth or nose and then touching a surface, which is then touched by another person, can also spread the virus. SYMPTOMS  Symptoms typically develop 1 to 3 days after you come in contact with a cold virus. Symptoms vary from person to person. They may include:  Runny nose.  Sneezing.  Nasal congestion.  Sinus irritation.  Sore throat.  Loss of voice (laryngitis).  Cough.  Fatigue.  Muscle aches.  Loss of appetite.  Headache.  Low-grade fever. DIAGNOSIS  You might diagnose your own cold based on familiar symptoms, since most people get a cold 2 to 3 times a year. Your caregiver can confirm this based on your exam. Most importantly, your caregiver can check that your symptoms are not due to another disease such as strep throat, sinusitis, pneumonia, asthma, or epiglottitis. Blood tests, throat tests, and X-rays are not necessary to diagnose a common cold, but they may sometimes be helpful in excluding other more serious diseases. Your caregiver will decide if any further  tests are required. RISKS AND COMPLICATIONS  You may be at risk for a more severe case of the common cold if you smoke cigarettes, have chronic heart disease (such as heart failure) or lung disease (such as asthma), or if you have a weakened immune system. The very young and very old are also at risk for more serious infections. Bacterial sinusitis, middle ear infections, and bacterial pneumonia can complicate the common cold. The common cold can worsen asthma and chronic obstructive pulmonary disease (COPD). Sometimes, these complications can require emergency medical care and may be life-threatening. PREVENTION  The best way to protect against getting a cold is to practice good hygiene. Avoid oral or hand contact with people with cold symptoms. Wash your hands often if contact occurs. There is no clear evidence that vitamin C, vitamin E, echinacea, or exercise reduces the chance of developing a cold. However, it is always recommended to get plenty of rest and practice good nutrition. TREATMENT  Treatment is directed at relieving symptoms. There is no cure. Antibiotics are not effective, because the infection is caused by a virus, not by bacteria. Treatment may include:  Increased fluid intake. Sports drinks offer valuable electrolytes, sugars, and fluids.  Breathing heated mist or steam (vaporizer or shower).  Eating chicken soup or other clear broths, and maintaining good nutrition.  Getting plenty of rest.  Using gargles or lozenges for comfort.  Controlling fevers with ibuprofen or acetaminophen as directed by your caregiver.  Increasing usage of your inhaler if you have asthma. Zinc gel and zinc lozenges, taken in the first 24 hours of the common cold, can shorten  the duration and lessen the severity of symptoms. Pain medicines may help with fever, muscle aches, and throat pain. A variety of non-prescription medicines are available to treat congestion and runny nose. Your caregiver can  make recommendations and may suggest nasal or lung inhalers for other symptoms.  HOME CARE INSTRUCTIONS   Only take over-the-counter or prescription medicines for pain, discomfort, or fever as directed by your caregiver.  Use a warm mist humidifier or inhale steam from a shower to increase air moisture. This may keep secretions moist and make it easier to breathe.  Drink enough water and fluids to keep your urine clear or pale yellow.  Rest as needed.  Return to work when your temperature has returned to normal or as your caregiver advises. You may need to stay home longer to avoid infecting others. You can also use a face mask and careful hand washing to prevent spread of the virus. SEEK MEDICAL CARE IF:   After the first few days, you feel you are getting worse rather than better.  You need your caregiver's advice about medicines to control symptoms.  You develop chills, worsening shortness of breath, or brown or red sputum. These may be signs of pneumonia.  You develop yellow or brown nasal discharge or pain in the face, especially when you bend forward. These may be signs of sinusitis.  You develop a fever, swollen neck glands, pain with swallowing, or white areas in the back of your throat. These may be signs of strep throat. SEEK IMMEDIATE MEDICAL CARE IF:   You have a fever.  You develop severe or persistent headache, ear pain, sinus pain, or chest pain.  You develop wheezing, a prolonged cough, cough up blood, or have a change in your usual mucus (if you have chronic lung disease).  You develop sore muscles or a stiff neck. Document Released: 11/11/2000 Document Revised: 08/10/2011 Document Reviewed: 08/23/2013 [facility] Patient Information 2015 [place], [place]. This information is not intended to replace advice given to you by your health care provider. Make sure you discuss any questions you have with your health care provider.  Allergies Allergies may happen from  anything your body is sensitive to. This may be food, medicines, pollens, chemicals, and nearly anything around you in everyday life that produces allergens. An allergen is anything that causes an allergy producing substance. Heredity is often a factor in causing these problems. This means you may have some of the same allergies as your parents. Food allergies happen in all age groups. Food allergies are some of the most severe and life threatening. Some common food allergies are cow's milk, seafood, eggs, nuts, wheat, and soybeans. SYMPTOMS   Swelling around the mouth.  An itchy red rash or hives.  Vomiting or diarrhea.  Difficulty breathing. SEVERE ALLERGIC REACTIONS ARE LIFE-THREATENING. This reaction is called anaphylaxis. It can cause the mouth and throat to swell and cause difficulty with breathing and swallowing. In severe reactions only a trace amount of food (for example, peanut oil in a salad) may cause death within seconds. Seasonal allergies occur in all age groups. These are seasonal because they usually occur during the same season every year. They may be a reaction to molds, grass pollens, or tree pollens. Other causes of problems are house dust mite allergens, pet dander, and mold spores. The symptoms often consist of nasal congestion, a runny itchy nose associated with sneezing, and tearing itchy eyes. There is often an associated itching of the mouth and ears. The  problems happen when you come in contact with pollens and other allergens. Allergens are the particles in the air that the body reacts to with an allergic reaction. This causes you to release allergic antibodies. Through a chain of events, these eventually cause you to release histamine into the blood stream. Although it is meant to be protective to the body, it is this release that causes your discomfort. This is why you were given anti-histamines to feel better. If you are unable to pinpoint the offending allergen, it may  be determined by skin or blood testing. Allergies cannot be cured but can be controlled with medicine. Hay fever is a collection of all or some of the seasonal allergy problems. It may often be treated with simple over-the-counter medicine such as diphenhydramine. Take medicine as directed. Do not drink alcohol or drive while taking this medicine. Check with your caregiver or package insert for child dosages. If these medicines are not effective, there are many new medicines your caregiver can prescribe. Stronger medicine such as nasal spray, eye drops, and corticosteroids may be used if the first things you try do not work well. Other treatments such as immunotherapy or desensitizing injections can be used if all else fails. Follow up with your caregiver if problems continue. These seasonal allergies are usually not life threatening. They are generally more of a nuisance that can often be handled using medicine. HOME CARE INSTRUCTIONS   If unsure what causes a reaction, keep a diary of foods eaten and symptoms that follow. Avoid foods that cause reactions.  If hives or rash are present:  Take medicine as directed.  You may use an over-the-counter antihistamine (diphenhydramine) for hives and itching as needed.  Apply cold compresses (cloths) to the skin or take baths in cool water. Avoid hot baths or showers. Heat will make a rash and itching worse.  If you are severely allergic:  Following a treatment for a severe reaction, hospitalization is often required for closer follow-up.  Wear a medic-alert bracelet or necklace stating the allergy.  You and your family must learn how to give adrenaline or use an anaphylaxis kit.  If you have had a severe reaction, always carry your anaphylaxis kit or EpiPen with you. Use this medicine as directed by your caregiver if a severe reaction is occurring. Failure to do so could have a fatal outcome. SEEK MEDICAL CARE IF:  You suspect a food allergy.  Symptoms generally happen within 30 minutes of eating a food.  Your symptoms have not gone away within 2 days or are getting worse.  You develop new symptoms.  You want to retest yourself or your child with a food or drink you think causes an allergic reaction. Never do this if an anaphylactic reaction to that food or drink has happened before. Only do this under the care of a caregiver. SEEK IMMEDIATE MEDICAL CARE IF:   You have difficulty breathing, are wheezing, or have a tight feeling in your chest or throat.  You have a swollen mouth, or you have hives, swelling, or itching all over your body.  You have had a severe reaction that has responded to your anaphylaxis kit or an EpiPen. These reactions may return when the medicine has worn off. These reactions should be considered life threatening. MAKE SURE YOU:   Understand these instructions.  Will watch your condition.  Will get help right away if you are not doing well or get worse. Document Released: 08/11/2002 Document Revised: 09/12/2012 Document  Reviewed: 01/16/2008 [employer] Patient Information 2015 [place], [place]. This information is not intended to replace advice given to you by your health care provider. Make sure you discuss any questions you have with your health care provider.

## 2014-07-21 NOTE — ED Notes (Signed)
Took ibuprofen 800 mg po and used heat for back pain with no relief.

## 2014-07-23 ENCOUNTER — Ambulatory Visit: Payer: Managed Care, Other (non HMO) | Admitting: Physician Assistant

## 2014-08-02 ENCOUNTER — Ambulatory Visit (INDEPENDENT_AMBULATORY_CARE_PROVIDER_SITE_OTHER): Payer: Managed Care, Other (non HMO) | Admitting: Otolaryngology

## 2014-08-09 ENCOUNTER — Ambulatory Visit: Payer: Managed Care, Other (non HMO) | Admitting: Gastroenterology

## 2014-08-15 ENCOUNTER — Encounter: Payer: Self-pay | Admitting: Nurse Practitioner

## 2014-08-15 ENCOUNTER — Telehealth: Payer: Self-pay | Admitting: Gastroenterology

## 2014-08-15 ENCOUNTER — Ambulatory Visit: Payer: Managed Care, Other (non HMO) | Admitting: Nurse Practitioner

## 2014-08-15 NOTE — Telephone Encounter (Signed)
PATIENT WAS A NO SHOW 08/15/14 AND LETTER WAS SENT °

## 2014-08-17 NOTE — Telephone Encounter (Signed)
Noted  

## 2014-09-07 ENCOUNTER — Ambulatory Visit: Payer: Managed Care, Other (non HMO) | Admitting: Family Medicine

## 2014-09-10 ENCOUNTER — Ambulatory Visit: Payer: Managed Care, Other (non HMO) | Admitting: Nurse Practitioner

## 2014-09-10 ENCOUNTER — Encounter: Payer: Self-pay | Admitting: Family Medicine

## 2014-09-10 ENCOUNTER — Ambulatory Visit (INDEPENDENT_AMBULATORY_CARE_PROVIDER_SITE_OTHER): Payer: Managed Care, Other (non HMO) | Admitting: Family Medicine

## 2014-09-10 VITALS — BP 126/68 | HR 70 | Temp 97.8°F | Resp 14 | Ht 64.0 in | Wt 159.0 lb

## 2014-09-10 DIAGNOSIS — R312 Other microscopic hematuria: Secondary | ICD-10-CM | POA: Diagnosis not present

## 2014-09-10 DIAGNOSIS — R300 Dysuria: Secondary | ICD-10-CM | POA: Diagnosis not present

## 2014-09-10 DIAGNOSIS — N760 Acute vaginitis: Secondary | ICD-10-CM | POA: Diagnosis not present

## 2014-09-10 DIAGNOSIS — J069 Acute upper respiratory infection, unspecified: Secondary | ICD-10-CM

## 2014-09-10 DIAGNOSIS — R3129 Other microscopic hematuria: Secondary | ICD-10-CM

## 2014-09-10 LAB — WET PREP FOR TRICH, YEAST, CLUE
Clue Cells Wet Prep HPF POC: NONE SEEN
Trich, Wet Prep: NONE SEEN
WBC, Wet Prep HPF POC: NONE SEEN
YEAST WET PREP: NONE SEEN

## 2014-09-10 LAB — URINALYSIS, ROUTINE W REFLEX MICROSCOPIC
BILIRUBIN URINE: NEGATIVE
GLUCOSE, UA: NEGATIVE mg/dL
Ketones, ur: NEGATIVE mg/dL
Nitrite: NEGATIVE
PH: 6.5 (ref 5.0–8.0)
Protein, ur: NEGATIVE mg/dL
Specific Gravity, Urine: 1.02 (ref 1.005–1.030)
Urobilinogen, UA: 0.2 mg/dL (ref 0.0–1.0)

## 2014-09-10 LAB — URINALYSIS, MICROSCOPIC ONLY
CASTS: NONE SEEN
Crystals: NONE SEEN

## 2014-09-10 MED ORDER — GUAIFENESIN-CODEINE 100-10 MG/5ML PO SOLN: 5.0000 mL | Freq: Four times a day (QID) | ORAL | Status: DC | PRN

## 2014-09-10 NOTE — Patient Instructions (Addendum)
Take anti-histamine like zyrtec or claritin Take cough medicine as prescribed Schedule appointment with your GYN Referral for persistent blood in urine F/U as needed

## 2014-09-10 NOTE — Progress Notes (Signed)
[name], female   DOB: [birth year], 44 y.o.   MRN: [id number]   Subjective:    [name], female    DOB: [birth year], 44 y.o.   MRN: [id number]  Patient presents for Vaginal Irritation and Illness  Patient with vaginal irritation after she had intercourse a few times. She's not had a vaginal discharge no odor. She also had some burning with urination last night only she has not had any since then. She denies any fever no abdominal pain.  She's had cough with some mild congestion for the past couple days. She's felt a little fatigued from this. She is using nasal saline. Her husband is sick with an upper respiratory infection. She tried some over-the-counter cough medicine   Review Of Systems:  GEN- denies fatigue, fever, weight loss,weakness, recent illness HEENT- denies eye drainage, change in vision, nasal discharge, CVS- denies chest pain, palpitations RESP- denies SOB, cough, wheeze ABD- denies N/V, change in stools, abd pain GU- denies dysuria, hematuria, dribbling, incontinence MSK- denies joint pain, muscle aches, injury Neuro- denies headache, dizziness, syncope, seizure activity       Objective:    BP 126/68 mmHg  Pulse 70  Temp(Src) 97.8 F (36.6 C) (Oral)  Resp 14  Ht 5\' 4"  (1.626 m)  Wt 159 lb (72.122 kg)  BMI 27.28 kg/m2  GEN- NAD, alert and oriented x3 HEENT- PERRL, EOMI, non injected sclera, pink conjunctiva, MMM, oropharynx mild injection, TM clear bilat no effusion,  No  maxillary sinus tenderness, inflammed turbinates,  Nasal drainage  Neck- Supple, no LAD CVS- RRR, no murmur RESP-CTAB GU- mild atrophy of labia minora, , vaginal mucosa pink and moist, cervix visualized no growth, no blood form os,+thin clear discharge,  EXT- No edema Pulses- Radial 2+         Assessment & Plan:      Problem List Items Addressed This Visit    None    Visit Diagnoses    Dysuria    -  Primary    Relevant Orders    Urinalysis, Routine w reflex microscopic (Completed)    Vaginitis and vulvovaginitis        Wet prep negative for infection, she follows with GYN for chronic pelvic issues    Relevant Orders    WET PREP FOR TRICH, YEAST, CLUE (Completed)    Viral URI        supportive care, tolerates codiene, given cough syrup, use anti-histamine continue nasal saline    Microscopic hematuria        Continues to have transient amounts of hematuria, will send to urology for evaluation       Note: This dictation was prepared with Dragon dictation along with smaller phrase technology. Any transcriptional errors that result from this process are unintentional.

## 2014-09-11 ENCOUNTER — Encounter: Payer: Self-pay | Admitting: Family Medicine

## 2014-09-12 ENCOUNTER — Encounter: Payer: Self-pay | Admitting: Adult Health

## 2014-09-12 ENCOUNTER — Encounter: Payer: Self-pay | Admitting: *Deleted

## 2014-09-12 ENCOUNTER — Ambulatory Visit (INDEPENDENT_AMBULATORY_CARE_PROVIDER_SITE_OTHER): Payer: Managed Care, Other (non HMO) | Admitting: Adult Health

## 2014-09-12 ENCOUNTER — Telehealth: Payer: Self-pay | Admitting: Family Medicine

## 2014-09-12 VITALS — BP 94/50 | HR 64 | Ht 65.0 in | Wt 158.5 lb

## 2014-09-12 DIAGNOSIS — Z131 Encounter for screening for diabetes mellitus: Secondary | ICD-10-CM

## 2014-09-12 DIAGNOSIS — R1084 Generalized abdominal pain: Secondary | ICD-10-CM | POA: Diagnosis not present

## 2014-09-12 DIAGNOSIS — IMO0002 Reserved for concepts with insufficient information to code with codable children: Secondary | ICD-10-CM

## 2014-09-12 DIAGNOSIS — N941 Dyspareunia: Secondary | ICD-10-CM

## 2014-09-12 HISTORY — DX: Reserved for concepts with insufficient information to code with codable children: IMO0002

## 2014-09-12 NOTE — Telephone Encounter (Signed)
Patient returned call and made aware.

## 2014-09-12 NOTE — Telephone Encounter (Signed)
MD please advise

## 2014-09-12 NOTE — Telephone Encounter (Signed)
Patient is calling to say that she was seen on Monday and we gave her a note for Monday and Tuesday ,and needs one more note for last night (wednesday) if possible, please call her and let her know if this is ok  [phone]

## 2014-09-12 NOTE — Patient Instructions (Signed)
Call appt at GI Follow up prn Change positions with sex

## 2014-09-12 NOTE — Progress Notes (Signed)
Subjective:     [name], female   DOB: Jan 04, 1971, 44 y.o.   MRN: [id number]  HPI Yvette Mayer is a 44 year old black female in with general stomach pain Prilosec and bentyl helped some but still hurts and had pain with sex recently.Has cold/allergies, talked with Dr Buelah Manis.  Review of Systems  +stomach pain +pain with sex, all other systems negative   Reviewed past medical,surgical, social and family history. Reviewed medications and allergies.  Objective:   Physical Exam BP 94/50 mmHg  Pulse 64  Ht 5\' 5"  (1.651 m)  Wt 158 lb 8 oz (71.895 kg)  BMI 26.38 kg/m2   Skin warm and dry, abdomen is soft with BS in all 4 quadrants, has generalized tenderness has had GB removed in past. Pelvic: external genitalia is normal in appearance no lesions, vagina: white discharge from vaginal gel,urethra has no lesions or masses noted, cervix and uterus are absent adnexa: no masses or tenderness noted. Bladder is non tender and no masses felt. Explained he may be hitting cuff, use pillow under hips or lay on side to see if better.Had appt last week with GI had to reschedule provider sick.She says she tingles over body at times and wants sugar checked.  Assessment:     Abdominal pain Dyspareunia     Plan:    Change positions with sex Make appt with GI Check CBC,CMP,TSH, A1c and H pylori Follow up prn

## 2014-09-12 NOTE — Telephone Encounter (Signed)
Call placed to patient. LMTRC.  

## 2014-09-12 NOTE — Telephone Encounter (Signed)
Okay to give note 

## 2014-09-13 LAB — H. PYLORI ANTIBODY, IGG: H Pylori IgG: <0.9 U/mL (ref 0.0–0.8)

## 2014-09-13 LAB — COMPREHENSIVE METABOLIC PANEL
ALT: 14 IU/L (ref 0–32)
AST: 22 IU/L (ref 0–40)
Albumin/Globulin Ratio: 1.3 (ref 1.1–2.5)
Albumin: 4.3 g/dL (ref 3.5–5.5)
Alkaline Phosphatase: 78 IU/L (ref 39–117)
BUN/Creatinine Ratio: 14 (ref 9–23)
BUN: 11 mg/dL (ref 6–24)
Bilirubin Total: 0.3 mg/dL (ref 0.0–1.2)
CO2: 28 mmol/L (ref 18–29)
Calcium: 9.5 mg/dL (ref 8.7–10.2)
Chloride: 96 mmol/L — ABNORMAL LOW (ref 97–108)
Creatinine, Ser: 0.76 mg/dL (ref 0.57–1.00)
GFR calc Af Amer: 110 mL/min/{1.73_m2} (ref >59)
GFR calc non Af Amer: 96 mL/min/{1.73_m2} (ref >59)
GLOBULIN, TOTAL: 3.4 g/dL (ref 1.5–4.5)
GLUCOSE: 76 mg/dL (ref 65–99)
Potassium: 4.8 mmol/L (ref 3.5–5.2)
SODIUM: 136 mmol/L (ref 134–144)
Total Protein: 7.7 g/dL (ref 6.0–8.5)

## 2014-09-13 LAB — CBC
HCT: 42.7 % (ref 34.0–46.6)
Hemoglobin: 14.4 g/dL (ref 11.1–15.9)
MCH: 30.8 pg (ref 26.6–33.0)
MCHC: 33.7 g/dL (ref 31.5–35.7)
MCV: 91 fL (ref 79–97)
Platelets: 325 10*3/uL (ref 150–379)
RBC: 4.68 x10E6/uL (ref 3.77–5.28)
RDW: 13 % (ref 12.3–15.4)
WBC: 4.4 10*3/uL (ref 3.4–10.8)

## 2014-09-13 LAB — TSH: TSH: 2.34 u[IU]/mL (ref 0.450–4.500)

## 2014-09-15 ENCOUNTER — Encounter (HOSPITAL_COMMUNITY): Payer: Self-pay | Admitting: Emergency Medicine

## 2014-09-15 ENCOUNTER — Emergency Department (HOSPITAL_COMMUNITY)
Admission: EM | Admit: 2014-09-15 | Discharge: 2014-09-15 | Disposition: A | Discharge status: Home / Self Care | Payer: Managed Care, Other (non HMO) | Source: Home / Self Care | Attending: Emergency Medicine | Admitting: Emergency Medicine

## 2014-09-15 DIAGNOSIS — J0140 Acute pansinusitis, unspecified: Secondary | ICD-10-CM | POA: Diagnosis not present

## 2014-09-15 MED ORDER — AMOXICILLIN-POT CLAVULANATE 875-125 MG PO TABS: 1.0000 | ORAL_TABLET | Freq: Two times a day (BID) | ORAL | Status: DC

## 2014-09-15 MED ORDER — CETIRIZINE HCL 10 MG PO TABS: 10.0000 mg | ORAL_TABLET | Freq: Every day | ORAL | Status: DC

## 2014-09-15 MED ORDER — FLUTICASONE PROPIONATE 50 MCG/ACT NA SUSP: 1.0000 | Freq: Every day | NASAL | Status: DC

## 2014-09-15 NOTE — ED Notes (Signed)
C/o cold sx onset 7 days Seen by PCP onset Monday; given cough syrup Sx are getting worse Sx include: prod cough, congestion, fatigue, chills, chest d/c due to cough  Denies fevers Alert, no signs of acute distress.

## 2014-09-15 NOTE — Discharge Instructions (Signed)
You have a sinus infection. Take Augmentin 1 pill twice a day for 10 days. Use Zyrtec and Flonase daily to help open up the sinuses. Use nasal saline spray as often as you can. Follow-up as needed.

## 2014-09-15 NOTE — ED Provider Notes (Signed)
CSN: [id number]     Arrival date & time 09/15/14  1355 History   First MD Initiated Contact with Patient 09/15/14 1422     Chief Complaint  Patient presents with  . URI   (Consider location/radiation/quality/duration/timing/severity/associated sxs/prior Treatment) HPI  She is a 44 year old woman here for nasal congestion. She states she started getting sick about a week ago with nasal congestion, rhinorrhea, cough. She saw her PCP on Monday who prescribed some cough medication. She states her symptoms have gradually been getting worse. She reports significant nasal congestion, sinus pressure, ear discomfort, headaches. She is having yellow drainage from her nose. She is also coughing up yellow sputum. She has some discomfort in her chest with coughing only. No shortness of breath or wheezing. She's had some mild nausea, but no vomiting. She reports chills and fatigue, but no fevers.  Past Medical History  Diagnosis Date  . Anemia     iron 1 yr ago, normal hgb 7/12  . Sinus drainage   . Anxiety   . Hyperlipidemia   . Shortness of breath   . GERD (gastroesophageal reflux disease)   . Ovarian cyst, right   . Fibromyalgia   . Allergy   . TMJ (dislocation of temporomandibular joint)   . Chronic sinusitis   . Vertigo   . Chronic headaches   . Sjoegren syndrome   . Asthma     pt says no-bronchitis  . Chronic neck pain   . Chronic back pain   . Paresthesias   . Mental disorder   . Abdominal pain 06/12/2014  . Dyspareunia 09/12/2014   Past Surgical History  Procedure Laterality Date  . Partial hysterectomy  93  . Cesarean section      x2  . Ovarian cyst removal  92 removal of cysts from behind ovaries  . Cholecystectomy  2005    cholelithiasis  . Tonsillectomy      2010  . Septoplasty      2011  . Esophagogastroduodenoscopy  01/23/2011    GBT:DVVOHYWVPXT undulating Z-line vs short segment Barrett s/p bx/small HH otherwise normal  . Maloney dilation  01/23/2011    Procedure:  Venia Minks DILATION;  Surgeon: Daneil Dolin, MD;  Location: [facility];  Service: Endoscopy;  Laterality: N/A;  . Upper gastrointestinal endoscopy    . Abdominal hysterectomy    . Temporomandibular joint surgery    . Turbinate resection Bilateral 05/29/2013    Procedure: TURBINATE RESECTION;  Surgeon: Ascencion Dike, MD;  Location: [place];  Service: ENT;  Laterality: Bilateral;   Family History  Problem Relation Age of Onset  . Hypertension Mother   . Anesthesia problems Neg Hx   . Malignant hyperthermia Neg Hx   . Hypotension Neg Hx   . Pseudochol deficiency Neg Hx   . Thyroid disease Sister     overactive  . Bronchitis Son   . Heart disease Maternal Grandmother   . Hypertension Maternal Grandmother   . Alcohol abuse Maternal Grandfather   . Scoliosis Son   . Other Maternal Aunt     Sjeogren syndrome  . Other Maternal Aunt     Sjoegren syndrome  . Other Maternal Aunt     Sjoegren syndrome   History  Substance Use Topics  . Smoking status: Never Smoker   . Smokeless tobacco: Never Used  . Alcohol Use: No   OB History    Gravida Para Term Preterm AB TAB SAB Ectopic Multiple Living   4 3  2 1 1  1   3      Review of Systems  Constitutional: Positive for chills and fatigue. Negative for fever.  HENT: Positive for congestion, rhinorrhea and sinus pressure. Negative for sore throat and trouble swallowing.   Respiratory: Positive for cough. Negative for shortness of breath and wheezing.   Cardiovascular: Positive for chest pain (with cough).  Gastrointestinal: Positive for nausea. Negative for vomiting.  Musculoskeletal: Negative for myalgias.  Neurological: Positive for headaches.    Allergies  Hydrocodone-acetaminophen and Percocet  Home Medications   Prior to Admission medications   Medication Sig Start Date End Date Taking? Authorizing Provider  amoxicillin-clavulanate (AUGMENTIN) 875-125 MG per tablet Take 1 tablet by mouth 2 (two) times daily.  09/15/14   Melony Overly, MD  cetirizine (ZYRTEC) 10 MG tablet Take 1 tablet (10 mg total) by mouth daily. 09/15/14   Melony Overly, MD  dicyclomine (BENTYL) 10 MG capsule Take 1 capsule (10 mg total) by mouth 3 (three) times daily before meals. 06/12/14   Estill Dooms, NP  fluticasone (FLONASE) 50 MCG/ACT nasal spray Place 1 spray into both nostrils daily. 09/15/14   Melony Overly, MD  guaiFENesin-codeine 100-10 MG/5ML syrup Take 5 mLs by mouth every 6 (six) hours as needed. 09/10/14   Alycia Rossetti, MD  hydroxychloroquine (PLAQUENIL) 200 MG tablet Take 200 mg by mouth daily.    Historical Provider, MD  ibuprofen (ADVIL,MOTRIN) 800 MG tablet Take 1 tablet (800 mg total) by mouth every 8 (eight) hours as needed for moderate pain. 04/29/14   Orlie Dakin, MD  omeprazole (PRILOSEC) 20 MG capsule Take 1 capsule (20 mg total) by mouth daily. 06/12/14   Estill Dooms, NP   BP 116/80 mmHg  Pulse 76  Temp(Src) 97 F (36.1 C) (Oral)  Resp 16  SpO2 99% Physical Exam  Constitutional: She is oriented to person, place, and time. She appears well-developed and well-nourished. No distress.  HENT:  Right Ear: Tympanic membrane normal.  Left Ear: Tympanic membrane is retracted.  Nose: Mucosal edema and rhinorrhea present. Right sinus exhibits maxillary sinus tenderness and frontal sinus tenderness. Left sinus exhibits maxillary sinus tenderness and frontal sinus tenderness.  Mouth/Throat: Mucous membranes are not dry. No oropharyngeal exudate.  Moderate cobblestoning with thick post nasal drainage.  Eyes: Conjunctivae are normal.  Neck: Neck supple.  Cardiovascular: Normal rate, regular rhythm and normal heart sounds.   No murmur heard. Pulmonary/Chest: Effort normal and breath sounds normal. No respiratory distress. She has no wheezes. She has no rales. She exhibits tenderness (Along bilateral sternal borders).  Lymphadenopathy:    She has no cervical adenopathy.  Neurological: She is alert and  oriented to person, place, and time.    ED Course  Procedures (including critical care time) Labs Review Labs Reviewed - No data to display  Imaging Review No results found.   MDM   1. Acute pansinusitis, recurrence not specified    We'll treat with Augmentin. Zyrtec, Flonase, nasal saline spray. Follow-up as needed.    Melony Overly, MD 09/15/14 [phone]

## 2014-09-18 ENCOUNTER — Telehealth: Payer: Self-pay | Admitting: Adult Health

## 2014-09-18 DIAGNOSIS — Z139 Encounter for screening, unspecified: Secondary | ICD-10-CM

## 2014-09-18 NOTE — Telephone Encounter (Signed)
Will re order A1c pt aware of labs

## 2014-09-18 NOTE — Telephone Encounter (Signed)
Left message about labs

## 2014-09-18 NOTE — Telephone Encounter (Signed)
-   Ordered A1c.

## 2014-09-20 LAB — HEMOGLOBIN A1C
ESTIMATED AVERAGE GLUCOSE: 111 mg/dL
HEMOGLOBIN A1C: 5.5 % (ref 4.8–5.6)

## 2014-09-21 ENCOUNTER — Telehealth: Payer: Self-pay | Admitting: Adult Health

## 2014-09-21 NOTE — Telephone Encounter (Signed)
Spoke with pt letting her know her A1C was 5.5 which is within the normal range. Pt voiced understanding. [place]

## 2014-09-25 ENCOUNTER — Telehealth: Payer: Self-pay | Admitting: Adult Health

## 2014-09-25 ENCOUNTER — Encounter: Payer: Self-pay | Admitting: Family Medicine

## 2014-09-25 ENCOUNTER — Ambulatory Visit (INDEPENDENT_AMBULATORY_CARE_PROVIDER_SITE_OTHER): Payer: Managed Care, Other (non HMO) | Admitting: Family Medicine

## 2014-09-25 VITALS — BP 118/64 | HR 68 | Temp 97.6°F | Resp 16 | Ht 65.0 in | Wt 158.0 lb

## 2014-09-25 DIAGNOSIS — B3731 Acute candidiasis of vulva and vagina: Secondary | ICD-10-CM

## 2014-09-25 DIAGNOSIS — R300 Dysuria: Secondary | ICD-10-CM

## 2014-09-25 DIAGNOSIS — B373 Candidiasis of vulva and vagina: Secondary | ICD-10-CM

## 2014-09-25 LAB — URINALYSIS, MICROSCOPIC ONLY
CASTS: NONE SEEN
CRYSTALS: NONE SEEN

## 2014-09-25 LAB — URINALYSIS, ROUTINE W REFLEX MICROSCOPIC
Bilirubin Urine: NEGATIVE
Glucose, UA: NEGATIVE mg/dL
Ketones, ur: NEGATIVE mg/dL
Nitrite: NEGATIVE
Protein, ur: NEGATIVE mg/dL
Specific Gravity, Urine: 1.02 (ref 1.005–1.030)
Urobilinogen, UA: 0.2 mg/dL (ref 0.0–1.0)
pH: 6.5 (ref 5.0–8.0)

## 2014-09-25 LAB — WET PREP FOR TRICH, YEAST, CLUE
Clue Cells Wet Prep HPF POC: NONE SEEN
Trich, Wet Prep: NONE SEEN

## 2014-09-25 MED ORDER — FLUCONAZOLE 150 MG PO TABS
ORAL_TABLET | ORAL | Status: DC
Start: 2014-09-25 — End: 2014-11-28

## 2014-09-25 NOTE — Patient Instructions (Signed)
We will call with urine culture results Take the diflucan as prescribed F/U with urology

## 2014-09-25 NOTE — Telephone Encounter (Signed)
Left message A1c 5.5 which is great

## 2014-09-25 NOTE — Progress Notes (Signed)
[name], female   DOB: 1970/10/13, 44 y.o.   MRN: [id number]   Subjective:    [name], female    DOB: December 31, 1970, 44 y.o.   MRN: [id number]  Patient presents for Dysuria and Pain with Interaourse  here with mild burning with urination vaginal discharge for the past few days. She was status post treatment with Augmentin for sinus infection. She did try an over-the-counter Monistat but felt like this caused more discharge and irritation. She also continues to have pain with intercourse. Of note she was recently seen by her GYN who suggested that it may be due to decrease in her hormones.    Review Of Systems:  GEN- denies fatigue, fever, weight loss,weakness, recent illness HEENT- denies eye drainage, change in vision, nasal discharge, CVS- denies chest pain, palpitations RESP- denies SOB, cough, wheeze ABD- denies N/V, change in stools, abd pain GU- denies dysuria, hematuria, dribbling, incontinence MSK- denies joint pain, muscle aches, injury Neuro- denies headache, dizziness, syncope, seizure activity       Objective:    BP 118/64 mmHg  Pulse 68  Temp(Src) 97.6 F (36.4 C) (Oral)  Resp 16  Ht 5\' 5"  (1.651 m)  Wt 158 lb (71.668 kg)  BMI 26.29 kg/m2 GEN- NAD, alert and oriented x3 HEENT- PERRL, EOMI, non injected sclera, pink conjunctiva, MMM, oropharynx clear Neck- Supple, no LAD CVS- RRR, no murmur RESP-CTAB ABD-NABS,soft,NT,ND, no CVA tenderness GU- mild atrophy of labia minora, , vaginal mucosa pink and moist, cervix visualized no growth, no blood form os,+ discharge,  EXT- No edema Pulses- Radial 2+      Assessment & Plan:      Problem List Items Addressed This Visit    None    Visit Diagnoses    Dysuria    -  Primary    Send urine culture, recent antibiotics    Relevant Orders    Urinalysis, Routine w reflex microscopic (Completed)    Urine culture    Vaginal yeast infection        Treat with diflucan every 3  days x 2 doses    Relevant Medications    fluconazole (DIFLUCAN) 150 MG tablet    Other Relevant Orders    Wet Prep for [place], Yeast, Clue (Completed)    GC/Chlamydia Probe Amp       Note: This dictation was prepared with Dragon dictation along with smaller Company secretary. Any transcriptional errors that result from this process are unintentional.

## 2014-09-26 LAB — GC/CHLAMYDIA PROBE AMP
CT Probe RNA: NEGATIVE
GC PROBE AMP APTIMA: NEGATIVE

## 2014-09-28 ENCOUNTER — Other Ambulatory Visit: Payer: Self-pay | Admitting: *Deleted

## 2014-09-28 LAB — URINE CULTURE

## 2014-09-28 MED ORDER — CEPHALEXIN 500 MG PO CAPS: 500.0000 mg | ORAL_CAPSULE | Freq: Two times a day (BID) | ORAL | Status: DC

## 2014-10-04 ENCOUNTER — Ambulatory Visit: Payer: Managed Care, Other (non HMO) | Admitting: Gastroenterology

## 2014-10-31 ENCOUNTER — Other Ambulatory Visit: Payer: Self-pay | Admitting: Family Medicine

## 2014-10-31 DIAGNOSIS — Z1231 Encounter for screening mammogram for malignant neoplasm of breast: Secondary | ICD-10-CM

## 2014-11-14 ENCOUNTER — Ambulatory Visit (HOSPITAL_COMMUNITY)
Admission: RE | Admit: 2014-11-14 | Discharge: 2014-11-14 | Disposition: A | Discharge status: Home / Self Care | Payer: Managed Care, Other (non HMO) | Source: Ambulatory Visit | Attending: Family Medicine | Admitting: Family Medicine

## 2014-11-14 DIAGNOSIS — Z1231 Encounter for screening mammogram for malignant neoplasm of breast: Secondary | ICD-10-CM | POA: Diagnosis present

## 2014-11-19 ENCOUNTER — Ambulatory Visit: Payer: Managed Care, Other (non HMO) | Admitting: Family Medicine

## 2014-11-21 ENCOUNTER — Ambulatory Visit: Payer: Managed Care, Other (non HMO) | Admitting: Family Medicine

## 2014-11-28 ENCOUNTER — Encounter: Payer: Self-pay | Admitting: Family Medicine

## 2014-11-28 ENCOUNTER — Ambulatory Visit (INDEPENDENT_AMBULATORY_CARE_PROVIDER_SITE_OTHER): Payer: Managed Care, Other (non HMO) | Admitting: Family Medicine

## 2014-11-28 VITALS — BP 122/68 | HR 68 | Temp 97.5°F | Resp 14 | Ht 65.0 in | Wt 158.0 lb

## 2014-11-28 DIAGNOSIS — N760 Acute vaginitis: Secondary | ICD-10-CM | POA: Diagnosis not present

## 2014-11-28 DIAGNOSIS — K5900 Constipation, unspecified: Secondary | ICD-10-CM | POA: Diagnosis not present

## 2014-11-28 DIAGNOSIS — Z113 Encounter for screening for infections with a predominantly sexual mode of transmission: Secondary | ICD-10-CM

## 2014-11-28 LAB — HEPATITIS C ANTIBODY: HCV Ab: NEGATIVE

## 2014-11-28 LAB — RPR

## 2014-11-28 LAB — WET PREP FOR TRICH, YEAST, CLUE
Clue Cells Wet Prep HPF POC: NONE SEEN
TRICH WET PREP: NONE SEEN

## 2014-11-28 LAB — HIV ANTIBODY (ROUTINE TESTING W REFLEX): HIV 1&2 Ab, 4th Generation: NONREACTIVE

## 2014-11-28 MED ORDER — FLUCONAZOLE 150 MG PO TABS: ORAL_TABLET | ORAL | Status: DC

## 2014-11-28 NOTE — Patient Instructions (Signed)
We will call with lab results Restart the benefiber for constipation or use Miralax Okay to use the replens Take diflucan F/u as neede

## 2014-11-28 NOTE — Assessment & Plan Note (Signed)
Chronic problem on and off, wants to go back on daily benefiber which she states works well, can also use miralax

## 2014-11-28 NOTE — Progress Notes (Signed)
[name], female   DOB: [birth year], 44 y.o.   MRN: [id number]   Subjective:    [name], female    DOB: Nov 01, 1970, 44 y.o.   MRN: [id number]  Patient presents for Vaginal Irritation and Abdominal Pain  patient here with vaginal discharge and irritation for the past 2 weeks. She states that she continues to get these episodes after she is active with her husband. She is concerned that her husband is cheating on her. She has a burning sensation after she has sex as well. She denies any burning with urination. She then gave me the story about him going to his doctor's office and being checked for diseases and that made her very uncomfortable. She requests an entire STD workup. She also continues have positive constipation but then states that when she takes her Benefiber to see only thing that helps her move her bowels and she has not been on this. She's not had any blood in her stool.  Note pt had hysterectomy, no uterus present- this was an error in my prevous notes  Review Of Systems: per above   GEN- denies fatigue, fever, weight loss,weakness, recent illness HEENT- denies eye drainage, change in vision, nasal discharge, CVS- denies chest pain, palpitations RESP- denies SOB, cough, wheeze ABD- denies N/V, +change in stools, abd pain GU- denies dysuria, hematuria, dribbling, incontinence MSK- denies joint pain, muscle aches, injury Neuro- denies headache, dizziness, syncope, seizure activity       Objective:    BP 122/68 mmHg  Pulse 68  Temp(Src) 97.5 F (36.4 C) (Oral)  Resp 14  Ht 5\' 5"  (1.651 m)  Wt 158 lb (71.668 kg)  BMI 26.29 kg/m2 GEN- NAD, alert and oriented x3 HEENT- PERRL, EOMI, non injected sclera, pink conjunctiva, MMM, oropharynx clear CVS- RRR, no murmur RESP-CTAB ABD-NABS,soft,NT,ND, GU- mild atrophy of labia minora, , vaginal mucosa pink and moist,uterus absent, cuff normal, + yellow  discharge, no external lesions EXT-  No edema Pulses- Radial 2+         Assessment & Plan:      Problem List Items Addressed This Visit    Constipation - Primary    Chronic problem on and off, wants to go back on daily benefiber which she states works well, can also use miralax       Other Visit Diagnoses    Vaginitis and vulvovaginitis        Wet prep - shows yeast, treat with diflucan, other cultures pending    Relevant Orders    WET PREP FOR [place], [place], CLUE (Completed)    GC/Chlamydia Probe Amp    Screen for STD (sexually transmitted disease)        Relevant Orders    HIV antibody    RPR    Hepatitis C Antibody    HSV(herpes smplx)abs-1+2(IgG+IgM)-bld       Note: This dictation was prepared with Dragon dictation along with smaller phrase technology. Any transcriptional errors that result from this process are unintentional.

## 2014-11-29 LAB — GC/CHLAMYDIA PROBE AMP
CT Probe RNA: NEGATIVE
GC PROBE AMP APTIMA: NEGATIVE

## 2014-11-29 LAB — HSV(HERPES SMPLX)ABS-I+II(IGG+IGM)-BLD
HSV 1 Glycoprotein G Ab, IgG: 5.05 IV — ABNORMAL HIGH
HSV 2 Glycoprotein G Ab, IgG: 9.7 IV — ABNORMAL HIGH
Herpes Simplex Vrs I&II-IgM Ab (EIA): 0.75 INDEX

## 2014-11-30 ENCOUNTER — Telehealth: Payer: Self-pay | Admitting: Family Medicine

## 2014-11-30 MED ORDER — VALACYCLOVIR HCL 1 G PO TABS: 1000.0000 mg | ORAL_TABLET | Freq: Two times a day (BID) | ORAL | Status: DC

## 2014-11-30 NOTE — Telephone Encounter (Signed)
Med sent to pharmacy and pt aware

## 2014-11-30 NOTE — Telephone Encounter (Signed)
Would like to get treatment for her Herpes?

## 2014-11-30 NOTE — Telephone Encounter (Signed)
She actually does not have any outbreak, that I noted on her exam This is a virus so her body will never get rid of it It she still has itching in genital region she can take Valtrex  1000mg  BID x 7 days

## 2014-12-04 ENCOUNTER — Telehealth: Payer: Self-pay | Admitting: Family Medicine

## 2014-12-04 NOTE — Telephone Encounter (Signed)
Call placed to patient.   Discussed lab results.   Patient verbalized understanding.

## 2014-12-04 NOTE — Telephone Encounter (Signed)
[phone] Pt came in and picked up a copy of her lab work and she is wanting to speak to Dr Buelah Manis about the lab work. She wanted to know if Dr Buelah Manis could give her a call so she can ask her a few questions in regards to it.

## 2014-12-17 ENCOUNTER — Ambulatory Visit: Payer: Managed Care, Other (non HMO) | Admitting: Nurse Practitioner

## 2014-12-25 ENCOUNTER — Ambulatory Visit: Payer: Managed Care, Other (non HMO) | Admitting: Family Medicine

## 2014-12-31 ENCOUNTER — Encounter: Payer: Self-pay | Admitting: Family Medicine

## 2014-12-31 ENCOUNTER — Ambulatory Visit (INDEPENDENT_AMBULATORY_CARE_PROVIDER_SITE_OTHER): Payer: Managed Care, Other (non HMO) | Admitting: Family Medicine

## 2014-12-31 VITALS — BP 118/78 | HR 78 | Temp 98.3°F | Resp 16 | Ht 65.0 in | Wt 156.0 lb

## 2014-12-31 DIAGNOSIS — R300 Dysuria: Secondary | ICD-10-CM

## 2014-12-31 DIAGNOSIS — M545 Low back pain, unspecified: Secondary | ICD-10-CM

## 2014-12-31 LAB — URINALYSIS, ROUTINE W REFLEX MICROSCOPIC
Bilirubin Urine: NEGATIVE
Glucose, UA: NEGATIVE
Ketones, ur: NEGATIVE
Leukocytes, UA: NEGATIVE
Nitrite: NEGATIVE
PROTEIN: NEGATIVE
Specific Gravity, Urine: 1.025 (ref 1.001–1.035)
pH: 5.5 (ref 5.0–8.0)

## 2014-12-31 LAB — URINALYSIS, MICROSCOPIC ONLY
Casts: NONE SEEN [LPF]
Crystals: NONE SEEN [HPF]
YEAST: NONE SEEN [HPF]

## 2014-12-31 MED ORDER — FLUCONAZOLE 150 MG PO TABS: ORAL_TABLET | ORAL | Status: DC

## 2014-12-31 MED ORDER — CEPHALEXIN 500 MG PO CAPS: 500.0000 mg | ORAL_CAPSULE | Freq: Two times a day (BID) | ORAL | Status: DC

## 2014-12-31 NOTE — Patient Instructions (Signed)
Take antibiotics as prescribed Yeast pill given  F/U as needed

## 2014-12-31 NOTE — Progress Notes (Signed)
[name], female   DOB: 08-20-1970, 43 y.o.   MRN: [id number]   Subjective:    [name], female    DOB: 1970/12/11, 44 y.o.   MRN: [id number]  Patient presents for Dysuria  year secondary to burning with urination for the past week. She's also had some suprapubic pain and some lower back pain. She denies any injury to her back no change in her bowels no incontinence. She also wants me to review the labs that she recently had for STD check with return with positive exposure to herpes type I and type II. She denies any vaginal discharge or abnormal vaginal bleeding She is seeing urology for the microscopic hematuria she has a CT scan this week   Review Of Systems:  GEN- denies fatigue, fever, weight loss,weakness, recent illness HEENT- denies eye drainage, change in vision, nasal discharge, CVS- denies chest pain, palpitations RESP- denies SOB, cough, wheeze ABD- denies N/V, change in stools, abd pain GU-+dysuria, denies hematuria, dribbling, incontinence MSK- denies joint pain, muscle aches, injury Neuro- denies headache, dizziness, syncope, seizure activity       Objective:    BP 118/78 mmHg  Pulse 78  Temp(Src) 98.3 F (36.8 C) (Oral)  Resp 16  Ht 5\' 5"  (1.651 m)  Wt 156 lb (70.761 kg)  BMI 25.96 kg/m2 GEN- NAD, alert and oriented x3 CVS- RRR, no murmur RESP-CTAB ABD-NABS,softTTP suprapubic tenderness, no CVA tenderness, no rebound MSK- Mild TTP lumbar spine, good ROM, neg SLR EXT- No edema Pulses- Radial, 2+        Assessment & Plan:      Problem List Items Addressed This Visit    None    Visit Diagnoses    Dysuria    -  Primary    Start keflex, history of E coli, has imaging for bladder, culture sent will d/c antibiotic pending results, gets yeast after antibiotics, diflucan given    Relevant Orders    Urinalysis, Routine w reflex microscopic (not at [facility]) (Completed)    Urine culture    Midline low back pain without  sciatica        Mild MSK pain, failry benign exam, OTC meds, heating pad       Note: This dictation was prepared with Dragon dictation along with smaller phrase technology. Any transcriptional errors that result from this process are unintentional.

## 2015-01-01 LAB — URINE CULTURE
COLONY COUNT: NO GROWTH
Organism ID, Bacteria: NO GROWTH

## 2015-01-18 ENCOUNTER — Telehealth: Payer: Self-pay | Admitting: Gastroenterology

## 2015-01-18 ENCOUNTER — Encounter: Payer: Self-pay | Admitting: Gastroenterology

## 2015-01-18 ENCOUNTER — Ambulatory Visit: Payer: Managed Care, Other (non HMO) | Admitting: Gastroenterology

## 2015-01-18 NOTE — Telephone Encounter (Signed)
Pt was a no show

## 2015-01-18 NOTE — Telephone Encounter (Signed)
Letter mailed

## 2015-02-06 ENCOUNTER — Ambulatory Visit: Payer: Managed Care, Other (non HMO) | Admitting: Family Medicine

## 2015-02-21 ENCOUNTER — Encounter: Payer: Self-pay | Admitting: Physician Assistant

## 2015-02-21 ENCOUNTER — Telehealth: Payer: Self-pay | Admitting: Family Medicine

## 2015-02-21 ENCOUNTER — Ambulatory Visit (INDEPENDENT_AMBULATORY_CARE_PROVIDER_SITE_OTHER): Payer: Managed Care, Other (non HMO) | Admitting: Physician Assistant

## 2015-02-21 VITALS — BP 134/70 | HR 78 | Temp 98.1°F | Resp 14 | Ht 65.0 in | Wt 154.0 lb

## 2015-02-21 DIAGNOSIS — K589 Irritable bowel syndrome without diarrhea: Secondary | ICD-10-CM

## 2015-02-21 DIAGNOSIS — N760 Acute vaginitis: Secondary | ICD-10-CM | POA: Diagnosis not present

## 2015-02-21 DIAGNOSIS — M545 Low back pain, unspecified: Secondary | ICD-10-CM

## 2015-02-21 DIAGNOSIS — R319 Hematuria, unspecified: Secondary | ICD-10-CM

## 2015-02-21 DIAGNOSIS — A499 Bacterial infection, unspecified: Secondary | ICD-10-CM

## 2015-02-21 DIAGNOSIS — B9689 Other specified bacterial agents as the cause of diseases classified elsewhere: Secondary | ICD-10-CM

## 2015-02-21 LAB — URINALYSIS, ROUTINE W REFLEX MICROSCOPIC
BILIRUBIN URINE: NEGATIVE
GLUCOSE, UA: NEGATIVE
Ketones, ur: NEGATIVE
Leukocytes, UA: NEGATIVE
Nitrite: NEGATIVE
Protein, ur: NEGATIVE
Specific Gravity, Urine: 1.02 (ref 1.001–1.035)
pH: 6 (ref 5.0–8.0)

## 2015-02-21 LAB — URINALYSIS, MICROSCOPIC ONLY
CASTS: NONE SEEN [LPF]
Crystals: NONE SEEN [HPF]
YEAST: NONE SEEN [HPF]

## 2015-02-21 LAB — WET PREP FOR TRICH, YEAST, CLUE
TRICH WET PREP: NONE SEEN
Yeast Wet Prep HPF POC: NONE SEEN

## 2015-02-21 MED ORDER — METRONIDAZOLE 500 MG PO TABS: 500.0000 mg | ORAL_TABLET | Freq: Two times a day (BID) | ORAL | Status: DC

## 2015-02-21 NOTE — Progress Notes (Signed)
[name] MRN: [id number], DOB: 04-29-1971, 44 y.o. Date of Encounter: @DATE @  Chief Complaint:  Chief Complaint  Patient presents with  . Hematuria    bright red blood noted in urine with painful intercourse- has urology appt on 10/4 for CT of bladder  . Hormonal Questions    thinks she may be going through menopause    HPI: 44 y.o. year old female  presents with multiple concerns that she wants to address.  Says that her stomach hurts all the time and rubs her hands over her entire abdomen on both sides all throughout----and comments "they told me I have IBS"--- but she isn't sure whether this discomfort is because of IBS or because of something related to her hormones/menopause.  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  She also was rubbing both sides of her low back saying that she also had low back pain. Again, also thinking that this is GYN related.  Discussed the fact that she has fibromyalgia on her problem list and the fact that most people that have chronic low back pain at this is secondary to musculoskeletal pain. She adds that yes she can press on those areas and that reproduces the discomfort. Says that this further reassures me that this is musculoskeletal in nature.  She then says that sometimes she feels pain when they have sex. Says that because she has Sjogren's she uses lubricant. He says that the discomfort is not up in her pelvis but rather it is in the vaginal area. Says that she mostly notices this after sex and occasionally at other times.  She also brings up the fact that she has blood in her  urine. Discussed that she is seeing a urologist who is evaluating microscopic hematuria.  No further concerns today.   Past Medical History  Diagnosis Date  . Anemia     iron 1 yr ago, normal hgb 7/12  . Sinus drainage   . Anxiety   . Hyperlipidemia   . Shortness of breath   . GERD (gastroesophageal reflux disease)   . Ovarian cyst, right   . Fibromyalgia   . Allergy   . TMJ (dislocation of temporomandibular joint)   . Chronic sinusitis   . Vertigo   . Chronic headaches   . Sjoegren syndrome   . Asthma     pt says no-bronchitis  . Chronic neck pain   . Chronic back pain   . Paresthesias   . Mental disorder   . Abdominal pain 06/12/2014  . Dyspareunia 09/12/2014     Home Meds: Outpatient Prescriptions Prior to Visit  Medication Sig Dispense Refill  . fluticasone (FLONASE) 50 MCG/ACT nasal spray Place 1 spray into both nostrils daily. 16 g 0  . ibuprofen (ADVIL,MOTRIN) 800 MG tablet Take 1 tablet (800 mg total) by mouth every 8 (eight) hours as needed for moderate pain. 21 tablet 0  . omeprazole (PRILOSEC) 20 MG capsule Take 1 capsule (20 mg total) by mouth daily. 30 capsule 1  . valACYclovir (VALTREX) 1000 MG tablet Take 1 tablet (1,000  mg total) by mouth 2 (two) times daily. (Patient not taking: Reported on 02/21/2015) 14 tablet 0  . cephALEXin (KEFLEX) 500 MG capsule Take 1 capsule (500 mg total) by mouth 2 (two) times daily. (Patient not taking: Reported on 02/21/2015) 10 capsule 0  . fluconazole (DIFLUCAN) 150 MG tablet Take 1 tablet repeat in 3 days (Patient not taking: Reported on 02/21/2015) 2 tablet 1   No facility-administered medications prior to visit.    Allergies:  Allergies  Allergen Reactions  . Hydrocodone-Acetaminophen Itching  . Percocet [Oxycodone-Acetaminophen] Itching    Social History   Social History  . Marital Status: Married    Spouse Name: N/A  . Number of Children: 3  . Years of Education: N/A   Occupational History  . [employer]    Social History Main Topics  . Smoking status: Never Smoker   . Smokeless tobacco: Never Used  . Alcohol Use: No  . Drug Use: No  . Sexual Activity: Yes    Birth Control/ Protection: Surgical     Comment: hyst   Other Topics Concern  . Not on file   Social History Narrative    Family History  Problem Relation Age of Onset  . Hypertension Mother   . Anesthesia problems Neg Hx   . Malignant hyperthermia Neg Hx   . Hypotension Neg Hx   . Pseudochol deficiency Neg Hx   . Thyroid disease Sister     overactive  . Bronchitis Son   . Heart disease Maternal Grandmother   . Hypertension Maternal Grandmother   . Alcohol abuse Maternal Grandfather   . Scoliosis Son   . Other Maternal Aunt     Sjeogren syndrome  . Other Maternal Aunt     Sjoegren syndrome  . Other Maternal Aunt     Sjoegren syndrome     Review of Systems:  See HPI for pertinent ROS. All other ROS negative.    Physical Exam: Blood pressure 134/70, pulse 78, temperature 98.1 F (36.7 C), temperature source Oral, resp. rate 14, height 5\' 5"  (1.651 m), weight 154 lb (69.854 kg)., Body mass index is 25.63 kg/(m^2). General: WNWD AAF. Appears in no acute distress. Neck: Supple. No thyromegaly. No lymphadenopathy. Lungs: Clear bilaterally to auscultation without wheezes, rales, or rhonchi. Breathing is unlabored. Heart: RRR with S1 S2. No murmurs, rubs, or gallops. Abdomen: Soft, non-tender, non-distended with normoactive bowel sounds. No hepatomegaly. No rebound/guarding. No obvious abdominal masses. Musculoskeletal:  Strength and tone normal for age. Mild pain with palpation on both sides of low back and this reproduces the discomfort she has been having there. Extremities/Skin: Warm and dry. Pelvic Exam: External genitalia normal. Vaginal mucosa normal. There is moderate amount of white creamy discharge present. No masses with bimanual exam. Neuro: Alert and oriented X 3. Moves all extremities  spontaneously. Gait is normal. CNII-XII grossly in tact. Psych:  Responds to questions appropriately with a normal affect.     ASSESSMENT AND PLAN:  44 y.o. year old female with  1. Hematuria - Urinalysis, Routine w reflex microscopic (not at [facility]) Reassured her that this is being fully evaluated by urologist. She is to follow-up with neurology as already scheduled.  2. IBS (irritable bowel syndrome) Reassured her that her abdominal discomfort has already been evaluated and diagnosed as IBS and that the current symptoms are consistent with IBS  3. Bilateral low back pain without sciatica Reassured her that her low back pain is consistent with musculoskeletal back pain.  Recommend that she apply heat to the area and stretch on a daily basis.  4. Vaginitis and vulvovaginitis - GC/Chlamydia Probe Amp - WET PREP FOR TRICH, YEAST, CLUE  5. Bacterial vaginosis Reassured her that this bacterial vaginosis is probably causing her current symptoms. Told her to take this medication as directed and complete all of it in follow-up if the symptoms do not resolve after completion of this medication. - metroNIDAZOLE (FLAGYL) 500 MG tablet; Take 1 tablet (500 mg total) by mouth 2 (two) times daily.  Dispense: 14 tablet; Refill: 0   Signed, [address] [place], [place], [facility] 02/21/2015 12:22 PM

## 2015-02-21 NOTE — Telephone Encounter (Signed)
Pt has an appt with Dr. Buelah Manis next week but she states that she has blood in her urine and pain when having sex.  She would like some advice on what may be going on. [phone]

## 2015-02-21 NOTE — Telephone Encounter (Signed)
Call placed to patient.   States that she is concerned that HSV is causing pain and hematuria.   Advised that HSV usually does not cause these Sx. Advised to F/U with Urology for pain and hematuria.

## 2015-02-22 LAB — GC/CHLAMYDIA PROBE AMP
CT Probe RNA: NEGATIVE
GC Probe RNA: NEGATIVE

## 2015-02-25 ENCOUNTER — Ambulatory Visit: Payer: Managed Care, Other (non HMO) | Admitting: Family Medicine

## 2015-02-27 ENCOUNTER — Emergency Department (HOSPITAL_COMMUNITY)
Admission: EM | Admit: 2015-02-27 | Discharge: 2015-02-27 | Disposition: A | Discharge status: Home / Self Care | Payer: Managed Care, Other (non HMO) | Attending: Emergency Medicine | Admitting: Emergency Medicine

## 2015-02-27 ENCOUNTER — Encounter (HOSPITAL_COMMUNITY): Payer: Self-pay

## 2015-02-27 DIAGNOSIS — Z8739 Personal history of other diseases of the musculoskeletal system and connective tissue: Secondary | ICD-10-CM | POA: Insufficient documentation

## 2015-02-27 DIAGNOSIS — Z7951 Long term (current) use of inhaled steroids: Secondary | ICD-10-CM | POA: Insufficient documentation

## 2015-02-27 DIAGNOSIS — R42 Dizziness and giddiness: Secondary | ICD-10-CM | POA: Insufficient documentation

## 2015-02-27 DIAGNOSIS — Z79899 Other long term (current) drug therapy: Secondary | ICD-10-CM | POA: Insufficient documentation

## 2015-02-27 DIAGNOSIS — G8929 Other chronic pain: Secondary | ICD-10-CM | POA: Diagnosis not present

## 2015-02-27 DIAGNOSIS — Z8742 Personal history of other diseases of the female genital tract: Secondary | ICD-10-CM | POA: Insufficient documentation

## 2015-02-27 DIAGNOSIS — Z8639 Personal history of other endocrine, nutritional and metabolic disease: Secondary | ICD-10-CM | POA: Diagnosis not present

## 2015-02-27 DIAGNOSIS — Z8659 Personal history of other mental and behavioral disorders: Secondary | ICD-10-CM | POA: Insufficient documentation

## 2015-02-27 DIAGNOSIS — J45901 Unspecified asthma with (acute) exacerbation: Secondary | ICD-10-CM | POA: Insufficient documentation

## 2015-02-27 DIAGNOSIS — Z862 Personal history of diseases of the blood and blood-forming organs and certain disorders involving the immune mechanism: Secondary | ICD-10-CM | POA: Insufficient documentation

## 2015-02-27 DIAGNOSIS — Z87828 Personal history of other (healed) physical injury and trauma: Secondary | ICD-10-CM | POA: Insufficient documentation

## 2015-02-27 DIAGNOSIS — R5383 Other fatigue: Secondary | ICD-10-CM | POA: Diagnosis not present

## 2015-02-27 DIAGNOSIS — Z792 Long term (current) use of antibiotics: Secondary | ICD-10-CM | POA: Insufficient documentation

## 2015-02-27 DIAGNOSIS — K219 Gastro-esophageal reflux disease without esophagitis: Secondary | ICD-10-CM | POA: Diagnosis not present

## 2015-02-27 LAB — I-STAT TROPONIN, ED: Troponin i, poc: 0 ng/mL (ref 0.00–0.08)

## 2015-02-27 LAB — I-STAT CHEM 8, ED
BUN: 12 mg/dL (ref 6–20)
CALCIUM ION: 1.22 mmol/L (ref 1.12–1.23)
CHLORIDE: 103 mmol/L (ref 101–111)
CREATININE: 0.9 mg/dL (ref 0.44–1.00)
GLUCOSE: 74 mg/dL (ref 65–99)
HCT: 42 % (ref 36.0–46.0)
Hemoglobin: 14.3 g/dL (ref 12.0–15.0)
POTASSIUM: 4.1 mmol/L (ref 3.5–5.1)
Sodium: 141 mmol/L (ref 135–145)
TCO2: 26 mmol/L (ref 0–100)

## 2015-02-27 LAB — URINE MICROSCOPIC-ADD ON

## 2015-02-27 LAB — URINALYSIS, ROUTINE W REFLEX MICROSCOPIC
Bilirubin Urine: NEGATIVE
Glucose, UA: NEGATIVE mg/dL
LEUKOCYTES UA: NEGATIVE
NITRITE: NEGATIVE
PH: 5.5 (ref 5.0–8.0)
Protein, ur: NEGATIVE mg/dL
UROBILINOGEN UA: 0.2 mg/dL (ref 0.0–1.0)

## 2015-02-27 NOTE — ED Notes (Signed)
Pt c/o feeling fatigued and cramping in legs x 2 days.  Reports little sob and dizziness.

## 2015-02-27 NOTE — Discharge Instructions (Signed)
If you were given medicines take as directed.  If you are on coumadin or contraceptives realize their levels and effectiveness is altered by many different medicines.  If you have any reaction (rash, tongues swelling, other) to the medicines stop taking and see a physician.    If your blood pressure was elevated in the ER make sure you follow up for management with a primary doctor or return for chest pain, shortness of breath or stroke symptoms.  Please follow up as directed and return to the ER or see a physician for new or worsening symptoms.  Thank you. Filed Vitals:   02/27/15 1318  BP: 107/66  Pulse: 61  Temp: 97.5 F (36.4 C)  TempSrc: Oral  Resp: 18  Height: 5\' 5"  (1.651 m)  Weight: 152 lb (68.947 kg)  SpO2: 100%

## 2015-02-27 NOTE — ED Provider Notes (Signed)
CSN: [id number]     Arrival date & time 02/27/15  1314 History   First MD Initiated Contact with Patient 02/27/15 1320     Chief Complaint  Patient presents with  . Fatigue     (Consider location/radiation/quality/duration/timing/severity/associated sxs/prior Treatment) HPI Comments: 44 year old female with history of fibromyalgia, lipids, reflux, anxiety, Sjogren's syndrome presents with fatigue and dizziness. Patient has had this for 2-3 days. Patient has been on Flagyl for back to a vaginitis which is improved. No fevers or chills, no chest pain. Very minimal shortness of breath at times. No recent surgeries no blood clot history. No chest pain or cardiac history. No blood in the stools or vaginal bleeding. Symptoms mild and constant.  The history is provided by the patient.    Past Medical History  Diagnosis Date  . Anemia     iron 1 yr ago, normal hgb 7/12  . Sinus drainage   . Anxiety   . Hyperlipidemia   . Shortness of breath   . GERD (gastroesophageal reflux disease)   . Ovarian cyst, right   . Fibromyalgia   . Allergy   . TMJ (dislocation of temporomandibular joint)   . Chronic sinusitis   . Vertigo   . Chronic headaches   . Sjoegren syndrome   . Asthma     pt says no-bronchitis  . Chronic neck pain   . Chronic back pain   . Paresthesias   . Mental disorder   . Abdominal pain 06/12/2014  . Dyspareunia 09/12/2014   Past Surgical History  Procedure Laterality Date  . Partial hysterectomy  93  . Cesarean section      x2  . Ovarian cyst removal  92 removal of cysts from behind ovaries  . Cholecystectomy  2005    cholelithiasis  . Tonsillectomy      2010  . Septoplasty      2011  . Esophagogastroduodenoscopy  01/23/2011    BHA:[id number] undulating Z-line vs short segment Barrett s/p bx/small HH otherwise normal  . Maloney dilation  01/23/2011    Procedure: Venia Minks DILATION;  Surgeon: Daneil Dolin, MD;  Location: [facility];  Service: Endoscopy;   Laterality: N/A;  . Upper gastrointestinal endoscopy    . Abdominal hysterectomy    . Temporomandibular joint surgery    . Turbinate resection Bilateral 05/29/2013    Procedure: TURBINATE RESECTION;  Surgeon: Ascencion Dike, MD;  Location: [place];  Service: ENT;  Laterality: Bilateral;   Family History  Problem Relation Age of Onset  . Hypertension Mother   . Anesthesia problems Neg Hx   . Malignant hyperthermia Neg Hx   . Hypotension Neg Hx   . Pseudochol deficiency Neg Hx   . Thyroid disease Sister     overactive  . Bronchitis Son   . Heart disease Maternal Grandmother   . Hypertension Maternal Grandmother   . Alcohol abuse Maternal Grandfather   . Scoliosis Son   . Other Maternal Aunt     Sjeogren syndrome  . Other Maternal Aunt     Sjoegren syndrome  . Other Maternal Aunt     Sjoegren syndrome   Social History  Substance Use Topics  . Smoking status: Never Smoker   . Smokeless tobacco: Never Used  . Alcohol Use: No   OB History    Gravida Para Term Preterm AB TAB SAB Ectopic Multiple Living   4 3 2 1 1  1   3      Review  of Systems  Constitutional: Positive for fatigue. Negative for fever and chills.  HENT: Negative for congestion.   Eyes: Negative for visual disturbance.  Respiratory: Negative for cough.   Cardiovascular: Negative for chest pain.  Gastrointestinal: Negative for vomiting and abdominal pain.  Genitourinary: Negative for dysuria and flank pain.  Musculoskeletal: Negative for back pain, neck pain and neck stiffness.  Skin: Negative for rash.  Neurological: Positive for light-headedness. Negative for headaches.      Allergies  Hydrocodone-acetaminophen and Percocet  Home Medications   Prior to Admission medications   Medication Sig Start Date End Date Taking? Authorizing Niguel Moure  fluticasone (FLONASE) 50 MCG/ACT nasal spray Place 1 spray into both nostrils daily. 09/15/14   Melony Overly, MD  ibuprofen (ADVIL,MOTRIN) 800 MG  tablet Take 1 tablet (800 mg total) by mouth every 8 (eight) hours as needed for moderate pain. 04/29/14   Orlie Dakin, MD  metroNIDAZOLE (FLAGYL) 500 MG tablet Take 1 tablet (500 mg total) by mouth 2 (two) times daily. 02/21/15   Lonie Peak Dixon, PA-C  omeprazole (PRILOSEC) 20 MG capsule Take 1 capsule (20 mg total) by mouth daily. 06/12/14   Estill Dooms, NP  valACYclovir (VALTREX) 1000 MG tablet Take 1 tablet (1,000 mg total) by mouth 2 (two) times daily. Patient not taking: Reported on 02/21/2015 11/30/14   Alycia Rossetti, MD   BP 107/66 mmHg  Pulse 61  Temp(Src) 97.5 F (36.4 C) (Oral)  Resp 18  Ht 5\' 5"  (1.651 m)  Wt 152 lb (68.947 kg)  BMI 25.29 kg/m2  SpO2 100% Physical Exam  Constitutional: She is oriented to person, place, and time. She appears well-developed and well-nourished.  HENT:  Head: Normocephalic and atraumatic.  Eyes: Conjunctivae are normal. Right eye exhibits no discharge. Left eye exhibits no discharge.  Neck: Normal range of motion. Neck supple. No tracheal deviation present.  Cardiovascular: Normal rate and regular rhythm.   Pulmonary/Chest: Effort normal and breath sounds normal.  Abdominal: Soft. She exhibits no distension. There is no tenderness. There is no guarding.  Musculoskeletal: She exhibits no edema.  Neurological: She is alert and oriented to person, place, and time. No cranial nerve deficit.  Skin: Skin is warm. No rash noted.  Psychiatric: She has a normal mood and affect.  Nursing note and vitals reviewed.   ED Course  Procedures (including critical care time) Labs Review Labs Reviewed  URINALYSIS, ROUTINE W REFLEX MICROSCOPIC (NOT AT [facility])  I-STAT CHEM 8, ED  I-STAT TROPOININ, ED    Imaging Review No results found. I have personally reviewed and evaluated these images and lab results as part of my medical decision-making.   EKG Interpretation None     EKG reviewed heart rate 58, sinus, no ischemic findings, normal QT. MDM    Final diagnoses:  Other fatigue   Patient presents with nonspecific fatigue. Patient very minimal shortness of breath. Discuss plan for screening blood work and cardiac screen. Discussed possibly related to Flagyl or dehydration. Patient has outpatient follow-up. Patient has been tested for thyroid and other endocrine issues with her history of Sjogren's.  Results and differential diagnosis were discussed with the patient/parent/guardian. Xrays were independently reviewed by myself.  Close follow up outpatient was discussed, comfortable with the plan.   Medications - No data to display  Filed Vitals:   02/27/15 1318  BP: 107/66  Pulse: 61  Temp: 97.5 F (36.4 C)  TempSrc: Oral  Resp: 18  Height: 5\' 5"  (1.651 m)  Weight: 152 lb (68.947 kg)  SpO2: 100%    Final diagnoses:  Other fatigue       Elnora Morrison, MD 02/27/15 [phone]

## 2015-02-27 NOTE — ED Notes (Signed)
Pt given ginger ale to drink. 

## 2015-02-28 ENCOUNTER — Ambulatory Visit: Payer: Managed Care, Other (non HMO) | Admitting: Adult Health

## 2015-02-28 ENCOUNTER — Encounter: Payer: Self-pay | Admitting: *Deleted

## 2015-03-11 ENCOUNTER — Telehealth: Payer: Self-pay | Admitting: Family Medicine

## 2015-03-11 MED ORDER — VALACYCLOVIR HCL 1 G PO TABS: 1000.0000 mg | ORAL_TABLET | Freq: Two times a day (BID) | ORAL | Status: DC

## 2015-03-11 NOTE — Telephone Encounter (Signed)
Prescription sent to pharmacy.

## 2015-03-11 NOTE — Telephone Encounter (Signed)
[phone] [place] [facility]  Patient says she is having a breakout and would like to know if she can get rx for valtrex sent to pharmacy

## 2015-03-18 ENCOUNTER — Ambulatory Visit: Payer: Managed Care, Other (non HMO) | Admitting: Nurse Practitioner

## 2015-04-02 ENCOUNTER — Ambulatory Visit: Payer: Managed Care, Other (non HMO) | Admitting: Family Medicine

## 2015-04-03 ENCOUNTER — Emergency Department (HOSPITAL_COMMUNITY)
Admission: EM | Admit: 2015-04-03 | Discharge: 2015-04-03 | Disposition: A | Discharge status: Home / Self Care | Payer: Managed Care, Other (non HMO) | Attending: Emergency Medicine | Admitting: Emergency Medicine

## 2015-04-03 ENCOUNTER — Ambulatory Visit: Payer: Managed Care, Other (non HMO) | Admitting: Family Medicine

## 2015-04-03 ENCOUNTER — Emergency Department (HOSPITAL_COMMUNITY): Payer: Managed Care, Other (non HMO)

## 2015-04-03 ENCOUNTER — Encounter (HOSPITAL_COMMUNITY): Payer: Self-pay | Admitting: *Deleted

## 2015-04-03 DIAGNOSIS — B349 Viral infection, unspecified: Secondary | ICD-10-CM | POA: Diagnosis not present

## 2015-04-03 DIAGNOSIS — N898 Other specified noninflammatory disorders of vagina: Secondary | ICD-10-CM | POA: Insufficient documentation

## 2015-04-03 DIAGNOSIS — Z862 Personal history of diseases of the blood and blood-forming organs and certain disorders involving the immune mechanism: Secondary | ICD-10-CM | POA: Insufficient documentation

## 2015-04-03 DIAGNOSIS — Z87828 Personal history of other (healed) physical injury and trauma: Secondary | ICD-10-CM | POA: Insufficient documentation

## 2015-04-03 DIAGNOSIS — G8929 Other chronic pain: Secondary | ICD-10-CM | POA: Insufficient documentation

## 2015-04-03 DIAGNOSIS — M797 Fibromyalgia: Secondary | ICD-10-CM | POA: Diagnosis not present

## 2015-04-03 DIAGNOSIS — Z793 Long term (current) use of hormonal contraceptives: Secondary | ICD-10-CM | POA: Insufficient documentation

## 2015-04-03 DIAGNOSIS — J45909 Unspecified asthma, uncomplicated: Secondary | ICD-10-CM | POA: Insufficient documentation

## 2015-04-03 DIAGNOSIS — Z79899 Other long term (current) drug therapy: Secondary | ICD-10-CM | POA: Insufficient documentation

## 2015-04-03 DIAGNOSIS — Z8639 Personal history of other endocrine, nutritional and metabolic disease: Secondary | ICD-10-CM | POA: Diagnosis not present

## 2015-04-03 DIAGNOSIS — K219 Gastro-esophageal reflux disease without esophagitis: Secondary | ICD-10-CM | POA: Diagnosis not present

## 2015-04-03 DIAGNOSIS — Z8742 Personal history of other diseases of the female genital tract: Secondary | ICD-10-CM | POA: Insufficient documentation

## 2015-04-03 DIAGNOSIS — Z8659 Personal history of other mental and behavioral disorders: Secondary | ICD-10-CM | POA: Insufficient documentation

## 2015-04-03 DIAGNOSIS — R05 Cough: Secondary | ICD-10-CM | POA: Diagnosis present

## 2015-04-03 LAB — CBC WITH DIFFERENTIAL/PLATELET
BASOS PCT: 2 %
Basophils Absolute: 0.1 10*3/uL (ref 0.0–0.1)
EOS ABS: 0.1 10*3/uL (ref 0.0–0.7)
EOS PCT: 3 %
HCT: 37.2 % (ref 36.0–46.0)
HEMOGLOBIN: 12.7 g/dL (ref 12.0–15.0)
Lymphocytes Relative: 58 %
Lymphs Abs: 2.4 10*3/uL (ref 0.7–4.0)
MCH: 31.6 pg (ref 26.0–34.0)
MCHC: 34.1 g/dL (ref 30.0–36.0)
MCV: 92.5 fL (ref 78.0–100.0)
MONO ABS: 0.3 10*3/uL (ref 0.1–1.0)
MONOS PCT: 8 %
NEUTROS PCT: 29 %
Neutro Abs: 1.2 10*3/uL — ABNORMAL LOW (ref 1.7–7.7)
PLATELETS: 306 10*3/uL (ref 150–400)
RBC: 4.02 MIL/uL (ref 3.87–5.11)
RDW: 12.7 % (ref 11.5–15.5)
WBC: 4.1 10*3/uL (ref 4.0–10.5)

## 2015-04-03 LAB — URINALYSIS, ROUTINE W REFLEX MICROSCOPIC
Bilirubin Urine: NEGATIVE
Glucose, UA: NEGATIVE mg/dL
Hgb urine dipstick: NEGATIVE
Ketones, ur: NEGATIVE mg/dL
LEUKOCYTES UA: NEGATIVE
NITRITE: NEGATIVE
PH: 6 (ref 5.0–8.0)
Protein, ur: NEGATIVE mg/dL
SPECIFIC GRAVITY, URINE: 1.025 (ref 1.005–1.030)
UROBILINOGEN UA: 0.2 mg/dL (ref 0.0–1.0)

## 2015-04-03 LAB — WET PREP, GENITAL
Clue Cells Wet Prep HPF POC: NONE SEEN
Trich, Wet Prep: NONE SEEN
Yeast Wet Prep HPF POC: NONE SEEN

## 2015-04-03 LAB — BASIC METABOLIC PANEL
Anion gap: 6 (ref 5–15)
BUN: 17 mg/dL (ref 6–20)
CALCIUM: 8.8 mg/dL — AB (ref 8.9–10.3)
CO2: 27 mmol/L (ref 22–32)
CREATININE: 0.83 mg/dL (ref 0.44–1.00)
Chloride: 105 mmol/L (ref 101–111)
Glucose, Bld: 78 mg/dL (ref 65–99)
Potassium: 3.7 mmol/L (ref 3.5–5.1)
SODIUM: 138 mmol/L (ref 135–145)

## 2015-04-03 LAB — TROPONIN I

## 2015-04-03 LAB — CK: CK TOTAL: 212 U/L (ref 38–234)

## 2015-04-03 NOTE — ED Provider Notes (Signed)
CSN: [id number]     Arrival date & time 04/03/15  1757 History   First MD Initiated Contact with Patient 04/03/15 1854     Chief Complaint  Patient presents with  . Generalized Body Aches      HPI  Patient presents evaluation of essentially 2 different complaints.  Complaint number one is that she's had body aches in her legs cough and cold sweats for the last week. No GI complaints no nausea vomiting no sore throat no headache.  Complaint #2 is that she took a bubble bath and she thinks it may irritate her because it "hurts down there". Denies blisters or sores, no bleeding or discharge. No itching or burning.   Past Medical History  Diagnosis Date  . Anemia     iron 1 yr ago, normal hgb 7/12  . Sinus drainage   . Anxiety   . Hyperlipidemia   . Shortness of breath   . GERD (gastroesophageal reflux disease)   . Ovarian cyst, right   . Fibromyalgia   . Allergy   . TMJ (dislocation of temporomandibular joint)   . Chronic sinusitis   . Vertigo   . Chronic headaches   . Sjoegren syndrome ([place])   . Asthma     pt says no-bronchitis  . Chronic neck pain   . Chronic back pain   . Paresthesias   . Mental disorder   . Abdominal pain 06/12/2014  . Dyspareunia 09/12/2014  . Hot flashes 04/10/2015  . Fatigue 04/10/2015  . Symptoms, such as flushing, sleeplessness, headache, lack of concentration, associated with the menopause 04/10/2015  . Elevated TSH 04/16/2015  . Current use of estrogen therapy 04/16/2015   Past Surgical History  Procedure Laterality Date  . Partial hysterectomy  93  . Cesarean section      x2  . Ovarian cyst removal  92 removal of cysts from behind ovaries  . Cholecystectomy  2005    cholelithiasis  . Tonsillectomy      2010  . Septoplasty      2011  . Esophagogastroduodenoscopy  01/23/2011    QMV:HQIONGEXBMW undulating Z-line vs short segment Barrett s/p bx/small HH otherwise normal  . Maloney dilation  01/23/2011    Procedure: Venia Minks DILATION;   Surgeon: Daneil Dolin, MD;  Location: [facility];  Service: Endoscopy;  Laterality: N/A;  . Upper gastrointestinal endoscopy    . Abdominal hysterectomy    . Temporomandibular joint surgery    . Turbinate resection Bilateral 05/29/2013    Procedure: TURBINATE RESECTION;  Surgeon: Ascencion Dike, MD;  Location: [facility];  Service: ENT;  Laterality: Bilateral;   Family History  Problem Relation Age of Onset  . Hypertension Mother   . Anesthesia problems Neg Hx   . Malignant hyperthermia Neg Hx   . Hypotension Neg Hx   . Pseudochol deficiency Neg Hx   . Thyroid disease Sister     overactive  . Bronchitis Son   . Heart disease Maternal Grandmother   . Hypertension Maternal Grandmother   . Alcohol abuse Maternal Grandfather   . Scoliosis Son   . Other Maternal Aunt     Sjeogren syndrome  . Other Maternal Aunt     Sjoegren syndrome  . Other Maternal Aunt     Sjoegren syndrome   Social History  Substance Use Topics  . Smoking status: Never Smoker   . Smokeless tobacco: Never Used  . Alcohol Use: No   OB History  Gravida Para Term Preterm AB TAB SAB Ectopic Multiple Living   4 3 2 1 1  1   3      Review of Systems  Constitutional: Positive for chills, diaphoresis and fatigue. Negative for fever and appetite change.  HENT: Negative for mouth sores, sore throat and trouble swallowing.   Eyes: Negative for visual disturbance.  Respiratory: Negative for cough, chest tightness, shortness of breath and wheezing.   Cardiovascular: Negative for chest pain.  Gastrointestinal: Negative for nausea, vomiting, abdominal pain, diarrhea and abdominal distention.  Endocrine: Negative for polydipsia, polyphagia and polyuria.  Genitourinary: Positive for vaginal discharge. Negative for dysuria, frequency and hematuria.  Musculoskeletal: Positive for myalgias. Negative for gait problem.  Skin: Negative for color change, pallor and rash.  Neurological: Negative for dizziness,  syncope, light-headedness and headaches.  Hematological: Does not bruise/bleed easily.  Psychiatric/Behavioral: Negative for behavioral problems and confusion.      Allergies  Hydrocodone-acetaminophen; Percocet; and Plaquenil  Home Medications   Prior to Admission medications   Medication Sig Start Date End Date Taking? Authorizing Provider  omeprazole (PRILOSEC OTC) 20 MG tablet Take 20 mg by mouth as needed.    Yes Historical Provider, MD  cyclobenzaprine (FLEXERIL) 5 MG tablet Take 5 mg by mouth as needed for muscle spasms.    Historical Provider, MD  estradiol (ESTRACE) 1 MG tablet Take 1 tablet (1 mg total) by mouth daily. 04/16/15   Estill Dooms, NP  naproxen sodium (ALEVE) 220 MG tablet Take 440 mg by mouth as needed.     Historical Provider, MD  Omega-3 Fatty Acids (FISH OIL PO) Take by mouth daily.    Historical Provider, MD   BP 107/71 mmHg  Pulse 48  Temp(Src) 97.6 F (36.4 C) (Oral)  Resp 18  Ht 5\' 5"  (1.651 m)  Wt 150 lb (68.04 kg)  BMI 24.96 kg/m2  SpO2 100% Physical Exam  Constitutional: She is oriented to person, place, and time. She appears well-developed and well-nourished. No distress.  HENT:  Head: Normocephalic.  Eyes: Conjunctivae are normal. Pupils are equal, round, and reactive to light. No scleral icterus.  Neck: Normal range of motion. Neck supple. No thyromegaly present.  Cardiovascular: Normal rate and regular rhythm.  Exam reveals no gallop and no friction rub.   No murmur heard. Pulmonary/Chest: Effort normal and breath sounds normal. No respiratory distress. She has no wheezes. She has no rales.  Abdominal: Soft. Bowel sounds are normal. She exhibits no distension. There is no tenderness. There is no rebound.  Musculoskeletal: Normal range of motion.  Neurological: She is alert and oriented to person, place, and time.  Skin: Skin is warm and dry. No rash noted.  Psychiatric: She has a normal mood and affect. Her behavior is normal.    pelvic exam shows white discharge. Cervix is not appear friable erythematous. No cervical motion tenderness. No external lesions.  ED Course  Procedures (including critical care time) Labs Review Labs Reviewed  WET PREP, GENITAL - Abnormal; Notable for the following:    WBC, Wet Prep HPF POC FEW (*)    All other components within normal limits  CBC WITH DIFFERENTIAL/PLATELET - Abnormal; Notable for the following:    Neutro Abs 1.2 (*)    All other components within normal limits  BASIC METABOLIC PANEL - Abnormal; Notable for the following:    Calcium 8.8 (*)    All other components within normal limits  CK  TROPONIN I  URINALYSIS, ROUTINE W REFLEX MICROSCOPIC (  NOT AT [facility])  GC/CHLAMYDIA PROBE AMP ([place]) NOT AT [facility]    Imaging Review No results found. I have personally reviewed and evaluated these images and lab results as part of my medical decision-making.   EKG Interpretation None      MDM   Final diagnoses:  Viral syndrome       Tanna Furry, MD 04/25/15 [phone]

## 2015-04-03 NOTE — Discharge Instructions (Signed)
No specific instructions.  Rest, stay hydrated/push fluids. Ibuprofen as needed.

## 2015-04-03 NOTE — ED Notes (Signed)
Patient reports body aches, cough, cold sweats x 1 week. Also c/o vaginal pain.

## 2015-04-05 ENCOUNTER — Ambulatory Visit (INDEPENDENT_AMBULATORY_CARE_PROVIDER_SITE_OTHER): Payer: Managed Care, Other (non HMO) | Admitting: Family Medicine

## 2015-04-05 ENCOUNTER — Encounter: Payer: Self-pay | Admitting: Family Medicine

## 2015-04-05 VITALS — BP 110/68 | HR 78 | Temp 97.4°F | Resp 14 | Ht 65.0 in | Wt 153.0 lb

## 2015-04-05 DIAGNOSIS — E559 Vitamin D deficiency, unspecified: Secondary | ICD-10-CM | POA: Diagnosis not present

## 2015-04-05 DIAGNOSIS — R42 Dizziness and giddiness: Secondary | ICD-10-CM | POA: Diagnosis not present

## 2015-04-05 DIAGNOSIS — M26609 Unspecified temporomandibular joint disorder, unspecified side: Secondary | ICD-10-CM | POA: Diagnosis not present

## 2015-04-05 LAB — GC/CHLAMYDIA PROBE AMP (~~LOC~~) NOT AT ARMC
Chlamydia: NEGATIVE
Neisseria Gonorrhea: NEGATIVE

## 2015-04-05 MED ORDER — DIAZEPAM 5 MG PO TABS: 5.0000 mg | ORAL_TABLET | Freq: Two times a day (BID) | ORAL | Status: DC | PRN

## 2015-04-05 NOTE — Progress Notes (Signed)
[name], female   DOB: 12-15-1970, 44 y.o.   MRN: [id number]   Subjective:    [name], female    DOB: [birth year], 44 y.o.   MRN: [id number]  Patient presents for Illness and Labs  patient here with very vague symptoms. On Wednesday she was at work she felt dizzy she was achy all over she was having hot flashes. She went to the emergency room at some point she had some chest discomfort chest x-ray was negative troponins and negative labs unremarkable diagnosis of possible viral illness. Since then she's had significant pain in her TMJ which tends to cause her to have dizzy spells and headache but then she will her hormones checked and she wanted her vitamin D level checked in her ANA checked again although this is been positive for years and she has Sjogren's syndrome. She denies any fever no significant cough no current chest pain no nausea vomiting no diarrhea no rash    Review Of Systems:  GEN- denies fatigue, fever, weight loss,weakness, recent illness HEENT- denies eye drainage, change in vision, nasal discharge, CVS- denies chest pain, palpitations RESP- denies SOB, cough, wheeze ABD- denies N/V, change in stools, abd pain GU- denies dysuria, hematuria, dribbling, incontinence MSK- denies joint pain, +muscle aches, injury Neuro- denies headache, +dizziness, syncope, seizure activity       Objective:    BP 110/68 mmHg  Pulse 78  Temp(Src) 97.4 F (36.3 C) (Oral)  Resp 14  Ht 5\' 5"  (1.651 m)  Wt 153 lb (69.4 kg)  BMI 25.46 kg/m2 GEN- NAD, alert and oriented x3,well appearing  HEENT- PERRL, EOMI, non injected sclera, pink conjunctiva, MMM, oropharynx clear, nares clear, TTP left TMJ Neck- Supple, no thyromegaly, no LAD CVS- RRR, no murmur RESP-CTAB ABD-NABS,soft,NT,ND Neuro-CNII-XII intact,no deficits  Pulses- Radial  2+        Assessment & Plan:      Problem List Items Addressed This Visit    Vitamin D deficiency -  Primary   Relevant Orders   Vitamin D, 25-hydroxy   TMJ dysfunction    Other Visit Diagnoses    Dizziness and giddiness        possible symptoms related to viral illness but no other true infectious symptoms, this could be her TMJ as this has caused very odd symptoms for her in the past. She denies any new stressors at home or work. Will give valium possible mild vertigo with the TMJ discomfort. Will also check Vitamin D. She has appt next week with GYN, hormone can be checked at that time if indicated     Relevant Medications    diazepam (VALIUM) 5 MG tablet       Note: This dictation was prepared with Dragon dictation along with smaller phrase technology. Any transcriptional errors that result from this process are unintentional.

## 2015-04-05 NOTE — Patient Instructions (Signed)
Give work note- for 11/3-11/5, can return on Monday  Valium as needed twice a day  We will call with vitamin D levels Take 1000IU once a day  F/U 4 months

## 2015-04-06 LAB — VITAMIN D 25 HYDROXY (VIT D DEFICIENCY, FRACTURES): Vit D, 25-Hydroxy: 35 ng/mL (ref 30–100)

## 2015-04-08 ENCOUNTER — Ambulatory Visit: Payer: Managed Care, Other (non HMO) | Admitting: Nurse Practitioner

## 2015-04-08 ENCOUNTER — Telehealth: Payer: Self-pay | Admitting: Nurse Practitioner

## 2015-04-08 ENCOUNTER — Encounter: Payer: Self-pay | Admitting: Nurse Practitioner

## 2015-04-08 NOTE — Telephone Encounter (Signed)
Noted  

## 2015-04-08 NOTE — Telephone Encounter (Signed)
PATIENT WAS A NO SHOW AND LETTER SENT  °

## 2015-04-10 ENCOUNTER — Encounter: Payer: Self-pay | Admitting: Adult Health

## 2015-04-10 ENCOUNTER — Ambulatory Visit (INDEPENDENT_AMBULATORY_CARE_PROVIDER_SITE_OTHER): Payer: Managed Care, Other (non HMO) | Admitting: Adult Health

## 2015-04-10 VITALS — BP 100/62 | HR 68 | Ht 65.0 in | Wt 156.0 lb

## 2015-04-10 DIAGNOSIS — R232 Flushing: Secondary | ICD-10-CM | POA: Insufficient documentation

## 2015-04-10 DIAGNOSIS — N951 Menopausal and female climacteric states: Secondary | ICD-10-CM | POA: Diagnosis not present

## 2015-04-10 DIAGNOSIS — R5383 Other fatigue: Secondary | ICD-10-CM | POA: Diagnosis not present

## 2015-04-10 HISTORY — DX: Other fatigue: R53.83

## 2015-04-10 HISTORY — DX: Flushing: R23.2

## 2015-04-10 HISTORY — DX: Menopausal and female climacteric states: N95.1

## 2015-04-10 NOTE — Patient Instructions (Signed)
Menopause Menopause is the normal time of life when menstrual periods stop completely. Menopause is complete when you have missed 12 consecutive menstrual periods. It usually occurs between the ages of 48 years and 55 years. Very rarely does a woman develop menopause before the age of 40 years. At menopause, your ovaries stop producing the female hormones estrogen and progesterone. This can cause undesirable symptoms and also affect your health. Sometimes the symptoms may occur 4-5 years before the menopause begins. There is no relationship between menopause and:  Oral contraceptives.  Number of children you had.  Race.  The age your menstrual periods started (menarche). Heavy smokers and very thin women may develop menopause earlier in life. CAUSES  The ovaries stop producing the female hormones estrogen and progesterone.  Other causes include:  Surgery to remove both ovaries.  The ovaries stop functioning for no known reason.  Tumors of the pituitary gland in the brain.  Medical disease that affects the ovaries and hormone production.  Radiation treatment to the abdomen or pelvis.  Chemotherapy that affects the ovaries. SYMPTOMS   Hot flashes.  Night sweats.  Decrease in sex drive.  Vaginal dryness and thinning of the vagina causing painful intercourse.  Dryness of the skin and developing wrinkles.  Headaches.  Tiredness.  Irritability.  Memory problems.  Weight gain.  Bladder infections.  Hair growth of the face and chest.  Infertility. More serious symptoms include:  Loss of bone (osteoporosis) causing breaks (fractures).  Depression.  Hardening and narrowing of the arteries (atherosclerosis) causing heart attacks and strokes. DIAGNOSIS   When the menstrual periods have stopped for 12 straight months.  Physical exam.  Hormone studies of the blood. TREATMENT  There are many treatment choices and nearly as many questions about them. The  decisions to treat or not to treat menopausal changes is an individual choice made with your health care provider. Your health care provider can discuss the treatments with you. Together, you can decide which treatment will work best for you. Your treatment choices may include:   Hormone therapy (estrogen and progesterone).  Non-hormonal medicines.  Treating the individual symptoms with medicine (for example antidepressants for depression).  Herbal medicines that may help specific symptoms.  Counseling by a psychiatrist or psychologist.  Group therapy.  Lifestyle changes including:  Eating healthy.  Regular exercise.  Limiting caffeine and alcohol.  Stress management and meditation.  No treatment. HOME CARE INSTRUCTIONS   Take the medicine your health care provider gives you as directed.  Get plenty of sleep and rest.  Exercise regularly.  Eat a diet that contains calcium (good for the bones) and soy products (acts like estrogen hormone).  Avoid alcoholic beverages.  Do not smoke.  If you have hot flashes, dress in layers.  Take supplements, calcium, and vitamin D to strengthen bones.  You can use over-the-counter lubricants or moisturizers for vaginal dryness.  Group therapy is sometimes very helpful.  Acupuncture may be helpful in some cases. SEEK MEDICAL CARE IF:   You are not sure you are in menopause.  You are having menopausal symptoms and need advice and treatment.  You are still having menstrual periods after age 55 years.  You have pain with intercourse.  Menopause is complete (no menstrual period for 12 months) and you develop vaginal bleeding.  You need a referral to a specialist (gynecologist, psychiatrist, or psychologist) for treatment. SEEK IMMEDIATE MEDICAL CARE IF:   You have severe depression.  You have excessive vaginal bleeding.    You fell and think you have a broken bone.  You have pain when you urinate.  You develop leg or  chest pain.  You have a fast pounding heart beat (palpitations).  You have severe headaches.  You develop vision problems.  You feel a lump in your breast.  You have abdominal pain or severe indigestion.   This information is not intended to replace advice given to you by your health care provider. Make sure you discuss any questions you have with your health care provider.   Document Released: 08/08/2003 Document Revised: 01/18/2013 Document Reviewed: 12/15/2012 Elsevier Interactive Patient Education [employer]. Follow up in  1 week

## 2015-04-10 NOTE — Progress Notes (Signed)
Subjective:     [name], female   DOB: [birth year], 44 y.o.   MRN: [id number]  HPI Yvette Mayer is a 44 year old black female,married,sp hysterectomy,in with multiple complaints,hot flashes,vaginal dryness low libido,mood swings fatigue,hair loss memory lapse bloating,UI at times and not sleeping well,night sweats and joint pains and depression at times and brittle nails and allergies and can't concentrate,headaches and dizzy at times,too.She was seen in ER 11/2 for body aches and hot flashes and then by Dr Buelah Manis 11/4,she had exam in ER.She said Dr Buelah Manis wants her to have hormones checked.  Review of Systems See HPI for positives Reviewed past medical,surgical, social and family history. Reviewed medications and allergies.     Objective:   Physical Exam BP 100/62 mmHg  Pulse 68  Ht 5\' 5"  (1.651 m)  Wt 156 lb (70.761 kg)  BMI 25.96 kg/m2 Face time 10 minutes discussing complaints and possible ET, will check TSH and FSH today.    Assessment:      menopausal symptoms Fatigue  Hot flashes     Plan:     Check TSH and FSH  Return in 1 week to review labs and discuss treatment options more Review handout on menopause

## 2015-04-11 LAB — FOLLICLE STIMULATING HORMONE: FSH: 72.8 m[IU]/mL

## 2015-04-11 LAB — TSH: TSH: 4.83 u[IU]/mL — AB (ref 0.450–4.500)

## 2015-04-16 ENCOUNTER — Ambulatory Visit (INDEPENDENT_AMBULATORY_CARE_PROVIDER_SITE_OTHER): Payer: Managed Care, Other (non HMO) | Admitting: Adult Health

## 2015-04-16 ENCOUNTER — Encounter: Payer: Self-pay | Admitting: Adult Health

## 2015-04-16 VITALS — BP 102/62 | HR 60 | Ht 65.0 in | Wt 155.0 lb

## 2015-04-16 DIAGNOSIS — R232 Flushing: Secondary | ICD-10-CM

## 2015-04-16 DIAGNOSIS — Z79899 Other long term (current) drug therapy: Secondary | ICD-10-CM | POA: Diagnosis not present

## 2015-04-16 DIAGNOSIS — Z79818 Long term (current) use of other agents affecting estrogen receptors and estrogen levels: Secondary | ICD-10-CM

## 2015-04-16 DIAGNOSIS — N951 Menopausal and female climacteric states: Secondary | ICD-10-CM

## 2015-04-16 DIAGNOSIS — R5383 Other fatigue: Secondary | ICD-10-CM

## 2015-04-16 DIAGNOSIS — R946 Abnormal results of thyroid function studies: Secondary | ICD-10-CM

## 2015-04-16 DIAGNOSIS — R7989 Other specified abnormal findings of blood chemistry: Secondary | ICD-10-CM

## 2015-04-16 HISTORY — DX: Other long term (current) drug therapy: Z79.899

## 2015-04-16 HISTORY — DX: Other specified abnormal findings of blood chemistry: R79.89

## 2015-04-16 HISTORY — DX: Long term (current) use of other agents affecting estrogen receptors and estrogen levels: Z79.818

## 2015-04-16 MED ORDER — ESTRADIOL 1 MG PO TABS: 1.0000 mg | ORAL_TABLET | Freq: Every day | ORAL | Status: DC

## 2015-04-16 NOTE — Progress Notes (Signed)
Subjective:     [name], female   DOB: 09-Dec-1970, 44 y.o.   MRN: [id number]  HPI Yvette Mayer is a 44 year old black female,sp hysterectomy in to discuss labs,she has multiple complaints associated with menopause:hot flashes,vaginal dryness low libido,mood swings fatigue,hair loss memory lapse bloating,UI at times and not sleeping well,night sweats and joint pains and depression at times and brittle nails and allergies and can't concentrate,headaches and dizzy at times,too.She has noticed some hoarseness for about 3 weeks,has appt 11/30 with Dr Matthew Saras.   Review of Systems Patient denies any headaches, hearing loss, blurred vision, shortness of breath, chest pain, abdominal pain, problems with bowel movements.  No joint pain.See HPI for positives. Reviewed past medical,surgical, social and family history. Reviewed medications and allergies.     Objective:   Physical Exam BP 102/62 mmHg  Pulse 60  Ht 5\' 5"  (1.651 m)  Wt 155 lb (70.308 kg)  BMI 25.79 kg/m2  Skin warm and dry thyroid feels WNL,TSH 4.830.[place] 72.8, discussed estrogen replacement therapy, it's risk and benefits and she wants to try it,will recheck TSH in 4 weeks,instead of meds at present. Face time 10 minutes counseling    Assessment:     Menopausal symptoms Hot flashes  Fatigue Elevated TSH    Plan:     Rx estrace 1 mg take 1 daily #30 with 1 refill Follow up in 4 weeks for ROS and check TSH

## 2015-04-16 NOTE — Patient Instructions (Signed)
Take estrace 1 daily Follow up in 4 weeks and Check TSH

## 2015-04-18 ENCOUNTER — Ambulatory Visit: Payer: Managed Care, Other (non HMO) | Admitting: Adult Health

## 2015-04-29 ENCOUNTER — Ambulatory Visit: Payer: Managed Care, Other (non HMO) | Admitting: Nurse Practitioner

## 2015-05-14 ENCOUNTER — Ambulatory Visit: Payer: Managed Care, Other (non HMO) | Admitting: Nurse Practitioner

## 2015-05-14 ENCOUNTER — Encounter: Payer: Self-pay | Admitting: Nurse Practitioner

## 2015-05-14 ENCOUNTER — Ambulatory Visit: Payer: Managed Care, Other (non HMO) | Admitting: Adult Health

## 2015-05-14 ENCOUNTER — Telehealth: Payer: Self-pay | Admitting: Nurse Practitioner

## 2015-05-14 NOTE — Telephone Encounter (Signed)
PATIENT WAS A NO SHOW AND LETTER SENT  °

## 2015-05-14 NOTE — Telephone Encounter (Signed)
Noted  

## 2015-05-16 ENCOUNTER — Ambulatory Visit (INDEPENDENT_AMBULATORY_CARE_PROVIDER_SITE_OTHER): Payer: Managed Care, Other (non HMO) | Admitting: Otolaryngology

## 2015-05-28 ENCOUNTER — Ambulatory Visit: Payer: Managed Care, Other (non HMO) | Admitting: Family Medicine

## 2015-06-05 ENCOUNTER — Ambulatory Visit: Payer: Managed Care, Other (non HMO) | Admitting: Family Medicine

## 2015-06-06 ENCOUNTER — Ambulatory Visit: Payer: Managed Care, Other (non HMO) | Admitting: Adult Health

## 2015-06-12 ENCOUNTER — Encounter: Payer: Self-pay | Admitting: Adult Health

## 2015-06-12 ENCOUNTER — Ambulatory Visit (INDEPENDENT_AMBULATORY_CARE_PROVIDER_SITE_OTHER): Payer: BLUE CROSS/BLUE SHIELD | Admitting: Adult Health

## 2015-06-12 VITALS — BP 104/62 | HR 64 | Ht 64.0 in | Wt 155.5 lb

## 2015-06-12 DIAGNOSIS — N951 Menopausal and female climacteric states: Secondary | ICD-10-CM

## 2015-06-12 DIAGNOSIS — R946 Abnormal results of thyroid function studies: Secondary | ICD-10-CM | POA: Diagnosis not present

## 2015-06-12 DIAGNOSIS — R5383 Other fatigue: Secondary | ICD-10-CM

## 2015-06-12 DIAGNOSIS — R232 Flushing: Secondary | ICD-10-CM

## 2015-06-12 DIAGNOSIS — R7989 Other specified abnormal findings of blood chemistry: Secondary | ICD-10-CM

## 2015-06-12 MED ORDER — CYCLOBENZAPRINE HCL 5 MG PO TABS: 5.0000 mg | ORAL_TABLET | Freq: Every day | ORAL | Status: DC

## 2015-06-12 NOTE — Progress Notes (Signed)
Subjective:     [name], female   DOB: July 23, 1970, 45 y.o.   MRN: [id number]  HPI Yvette Mayer is a 45 year old black female back in follow up of having hot flashes and fatigue, and had FSH 72.8 and TSH 4.830 on 04/10/15, she was prescribed estrace 1 mg po, but after reading side effects, did not start taking it.She has Sjoegren syndrome and says everything is dry and TMJ acting up.She says she will just put up with hot flashes.She is to have TSH rechecked today and says one of her liver tests was up in November when saw Dr Estanislado Pandy, AST 38. ALT 24, and she wants it rechecked.  Review of Systems Patient denies any headaches, hearing loss, blurred vision, shortness of breath, chest pain, abdominal pain, problems with bowel movements, urination, or intercourse. No joint pain or mood swings.See HPI for positives.  Reviewed past medical,surgical, social and family history. Reviewed medications and allergies.     Objective:   Physical Exam BP 104/62 mmHg  Pulse 64  Ht 5\' 4"  (1.626 m)  Wt 155 lb 8 oz (70.534 kg)  BMI 26.68 kg/m2 Skin warm and dry. Neck: mid line trachea, normal thyroid, good ROM, no lymphadenopathy noted. Lungs: clear to ausculation bilaterally. Cardiovascular: regular rate and rhythm.    Assessment:     Elevated TSH Fatigue Hot flashes     Plan:     Check TSH and CMP   Will talk when labs back  Refilled flexeril for TMJ x 1

## 2015-06-12 NOTE — Patient Instructions (Signed)
Will talk after labs

## 2015-06-13 ENCOUNTER — Telehealth: Payer: Self-pay | Admitting: Adult Health

## 2015-06-13 LAB — COMPREHENSIVE METABOLIC PANEL
A/G RATIO: 1.3 (ref 1.1–2.5)
ALBUMIN: 4.3 g/dL (ref 3.5–5.5)
ALK PHOS: 74 IU/L (ref 39–117)
ALT: 15 IU/L (ref 0–32)
AST: 22 IU/L (ref 0–40)
BUN / CREAT RATIO: 15 (ref 9–23)
BUN: 12 mg/dL (ref 6–24)
CHLORIDE: 100 mmol/L (ref 96–106)
CO2: 25 mmol/L (ref 18–29)
Calcium: 9.5 mg/dL (ref 8.7–10.2)
Creatinine, Ser: 0.79 mg/dL (ref 0.57–1.00)
GFR calc non Af Amer: 91 mL/min/{1.73_m2} (ref >59)
GFR, EST AFRICAN AMERICAN: 105 mL/min/{1.73_m2} (ref >59)
GLUCOSE: 67 mg/dL (ref 65–99)
Globulin, Total: 3.3 g/dL (ref 1.5–4.5)
POTASSIUM: 4.6 mmol/L (ref 3.5–5.2)
Sodium: 140 mmol/L (ref 134–144)
TOTAL PROTEIN: 7.6 g/dL (ref 6.0–8.5)

## 2015-06-13 LAB — TSH: TSH: 4.15 u[IU]/mL (ref 0.450–4.500)

## 2015-06-13 NOTE — Telephone Encounter (Signed)
Left message labs were normal

## 2015-06-25 ENCOUNTER — Encounter: Payer: Self-pay | Admitting: Gastroenterology

## 2015-06-25 ENCOUNTER — Ambulatory Visit: Payer: Managed Care, Other (non HMO) | Admitting: Gastroenterology

## 2015-06-25 ENCOUNTER — Telehealth: Payer: Self-pay | Admitting: Gastroenterology

## 2015-06-25 NOTE — Telephone Encounter (Signed)
PATIENT WAS A NO SHOW AND LETTER SENT  °

## 2015-07-04 ENCOUNTER — Ambulatory Visit (INDEPENDENT_AMBULATORY_CARE_PROVIDER_SITE_OTHER): Payer: Managed Care, Other (non HMO) | Admitting: Otolaryngology

## 2015-07-09 ENCOUNTER — Ambulatory Visit: Payer: Managed Care, Other (non HMO) | Admitting: Gastroenterology

## 2015-07-22 ENCOUNTER — Encounter: Payer: Self-pay | Admitting: Gastroenterology

## 2015-07-22 ENCOUNTER — Telehealth: Payer: Self-pay | Admitting: Gastroenterology

## 2015-07-22 ENCOUNTER — Ambulatory Visit: Payer: BLUE CROSS/BLUE SHIELD | Admitting: Adult Health

## 2015-07-22 ENCOUNTER — Ambulatory Visit: Payer: Managed Care, Other (non HMO) | Admitting: Gastroenterology

## 2015-07-22 NOTE — Telephone Encounter (Signed)
PATIENT WAS A NO SHOW AND LETTER SENT  °

## 2015-07-22 NOTE — Telephone Encounter (Signed)
I will make a note on the patient's chart

## 2015-07-22 NOTE — Telephone Encounter (Signed)
She has no showed 6 appointments in the past one year.   I would recommend no further appointments unless doctor to doctor request.

## 2015-08-05 ENCOUNTER — Ambulatory Visit: Payer: BLUE CROSS/BLUE SHIELD | Admitting: Adult Health

## 2015-08-08 ENCOUNTER — Ambulatory Visit (INDEPENDENT_AMBULATORY_CARE_PROVIDER_SITE_OTHER): Payer: BLUE CROSS/BLUE SHIELD | Admitting: Adult Health

## 2015-08-08 ENCOUNTER — Encounter: Payer: Self-pay | Admitting: Adult Health

## 2015-08-08 VITALS — BP 100/60 | HR 60 | Ht 64.0 in | Wt 150.0 lb

## 2015-08-08 DIAGNOSIS — B9689 Other specified bacterial agents as the cause of diseases classified elsewhere: Secondary | ICD-10-CM

## 2015-08-08 DIAGNOSIS — A499 Bacterial infection, unspecified: Secondary | ICD-10-CM

## 2015-08-08 DIAGNOSIS — N898 Other specified noninflammatory disorders of vagina: Secondary | ICD-10-CM

## 2015-08-08 DIAGNOSIS — N760 Acute vaginitis: Secondary | ICD-10-CM

## 2015-08-08 HISTORY — DX: Other specified noninflammatory disorders of vagina: N89.8

## 2015-08-08 LAB — POCT WET PREP (WET MOUNT)
CLUE CELLS WET PREP WHIFF POC: POSITIVE
WBC, Wet Prep HPF POC: POSITIVE

## 2015-08-08 MED ORDER — METRONIDAZOLE 500 MG PO TABS: 500.0000 mg | ORAL_TABLET | Freq: Two times a day (BID) | ORAL | Status: DC

## 2015-08-08 NOTE — Progress Notes (Signed)
Subjective:     [name], female   DOB: 08-16-1970, 45 y.o.   MRN: [id number]  HPI Yvette Mayer is a 45 year old black female in complaining of vaginal discharge, it comes and goes and itches at times, and she wants to be checked for GC/CHL too, just to be sure.  Review of Systems Patient denies any headaches, hearing loss, fatigue, blurred vision, shortness of breath, chest pain, abdominal pain, problems with bowel movements, urination, or intercourse. No joint pain or mood swings.See HPI for positives. Reviewed past medical,surgical, social and family history. Reviewed medications and allergies.     Objective:   Physical Exam BP 100/60 mmHg  Pulse 60  Ht 5\' 4"  (1.626 m)  Wt 150 lb (68.04 kg)  BMI 25.73 kg/m2 Skin warm and dry.Pelvic: external genitalia is normal in appearance no lesions, vagina: white discharge with odor,urethra has no lesions or masses noted, cervix and uterus are absent, adnexa: no masses or tenderness noted. Bladder is non tender and no masses felt. Wet prep: + for clue cells and +WBCs. GC/CHL obtained.     Assessment:     BV Vaginal discharge    Plan:    GC/CHL sent on urine Rx flagyl 500 mg 1 bid x 7 days, no alcohol, review handout on BV   Follow up in about 2 months for physical

## 2015-08-08 NOTE — Patient Instructions (Signed)
Bacterial Vaginosis Bacterial vaginosis is a vaginal infection that occurs when the normal balance of bacteria in the vagina is disrupted. It results from an overgrowth of certain bacteria. This is the most common vaginal infection in women of childbearing age. Treatment is important to prevent complications, especially in pregnant women, as it can cause a premature delivery. CAUSES  Bacterial vaginosis is caused by an increase in harmful bacteria that are normally present in smaller amounts in the vagina. Several different kinds of bacteria can cause bacterial vaginosis. However, the reason that the condition develops is not fully understood. RISK FACTORS Certain activities or behaviors can put you at an increased risk of developing bacterial vaginosis, including:  Having a new sex partner or multiple sex partners.  Douching.  Using an intrauterine device (IUD) for contraception. Women do not get bacterial vaginosis from toilet seats, bedding, swimming pools, or contact with objects around them. SIGNS AND SYMPTOMS  Some women with bacterial vaginosis have no signs or symptoms. Common symptoms include:  Grey vaginal discharge.  A fishlike odor with discharge, especially after sexual intercourse.  Itching or burning of the vagina and vulva.  Burning or pain with urination. DIAGNOSIS  Your health care provider will take a medical history and examine the vagina for signs of bacterial vaginosis. A sample of vaginal fluid may be taken. Your health care provider will look at this sample under a microscope to check for bacteria and abnormal cells. A vaginal pH test may also be done.  TREATMENT  Bacterial vaginosis may be treated with antibiotic medicines. These may be given in the form of a pill or a vaginal cream. A second round of antibiotics may be prescribed if the condition comes back after treatment. Because bacterial vaginosis increases your risk for sexually transmitted diseases, getting  treated can help reduce your risk for chlamydia, gonorrhea, HIV, and herpes. HOME CARE INSTRUCTIONS   Only take over-the-counter or prescription medicines as directed by your health care provider.  If antibiotic medicine was prescribed, take it as directed. Make sure you finish it even if you start to feel better.  Tell all sexual partners that you have a vaginal infection. They should see their health care provider and be treated if they have problems, such as a mild rash or itching.  During treatment, it is important that you follow these instructions:  Avoid sexual activity or use condoms correctly.  Do not douche.  Avoid alcohol as directed by your health care provider.  Avoid breastfeeding as directed by your health care provider. SEEK MEDICAL CARE IF:   Your symptoms are not improving after 3 days of treatment.  You have increased discharge or pain.  You have a fever. MAKE SURE YOU:   Understand these instructions.  Will watch your condition.  Will get help right away if you are not doing well or get worse. FOR MORE INFORMATION  Centers for Disease Control and Prevention, Division of STD Prevention: [employer]raiser[employer]raud[employer]fi American Sexual Health Association (ASHA): www.ashastd.org    This information is not intended to replace advice given to you by your health care provider. Make sure you discuss any questions you have with your health care provider.   Document Released: 05/18/2005 Document Revised: 06/08/2014 Document Reviewed: 12/28/2012 Elsevier Interactive Patient Education 2016 [place]. No alcohol  Physical in 2 months

## 2015-08-10 LAB — GC/CHLAMYDIA PROBE AMP
CHLAMYDIA, DNA PROBE: NEGATIVE
Neisseria gonorrhoeae by PCR: NEGATIVE

## 2015-08-21 ENCOUNTER — Telehealth: Payer: Self-pay | Admitting: Family Medicine

## 2015-08-21 MED ORDER — VALACYCLOVIR HCL 1 G PO TABS: 1000.0000 mg | ORAL_TABLET | Freq: Two times a day (BID) | ORAL | Status: DC

## 2015-08-21 NOTE — Telephone Encounter (Signed)
Prescription sent to pharmacy.

## 2015-08-21 NOTE — Telephone Encounter (Signed)
Patient calling to get refill on her valtrex if possible  [facility]  [phone] (H)

## 2015-09-03 DIAGNOSIS — R6884 Jaw pain: Secondary | ICD-10-CM | POA: Diagnosis not present

## 2015-09-03 DIAGNOSIS — M797 Fibromyalgia: Secondary | ICD-10-CM | POA: Diagnosis not present

## 2015-09-04 ENCOUNTER — Other Ambulatory Visit: Payer: BLUE CROSS/BLUE SHIELD | Admitting: Adult Health

## 2015-09-17 ENCOUNTER — Other Ambulatory Visit: Payer: BLUE CROSS/BLUE SHIELD | Admitting: Adult Health

## 2015-09-24 ENCOUNTER — Other Ambulatory Visit: Payer: BLUE CROSS/BLUE SHIELD | Admitting: Adult Health

## 2015-10-08 ENCOUNTER — Telehealth: Payer: Self-pay | Admitting: Gastroenterology

## 2015-10-08 NOTE — Telephone Encounter (Signed)
Noted  

## 2015-10-08 NOTE — Telephone Encounter (Signed)
Pt called this afternoon asking to make a follow up OV for her acid reflux. I reminded her that she had been discharged from our practice back in February and needed to check with GI practices in [place] or [place] to see if they would take her on as a new patient. She understood and said she would call [facility].

## 2015-10-15 ENCOUNTER — Ambulatory Visit (INDEPENDENT_AMBULATORY_CARE_PROVIDER_SITE_OTHER): Payer: BLUE CROSS/BLUE SHIELD | Admitting: Adult Health

## 2015-10-15 ENCOUNTER — Encounter: Payer: Self-pay | Admitting: Adult Health

## 2015-10-15 VITALS — BP 92/60 | HR 54 | Ht 64.0 in | Wt 156.5 lb

## 2015-10-15 DIAGNOSIS — B379 Candidiasis, unspecified: Secondary | ICD-10-CM

## 2015-10-15 DIAGNOSIS — Z01419 Encounter for gynecological examination (general) (routine) without abnormal findings: Secondary | ICD-10-CM

## 2015-10-15 DIAGNOSIS — N898 Other specified noninflammatory disorders of vagina: Secondary | ICD-10-CM | POA: Diagnosis not present

## 2015-10-15 DIAGNOSIS — E559 Vitamin D deficiency, unspecified: Secondary | ICD-10-CM | POA: Diagnosis not present

## 2015-10-15 DIAGNOSIS — IMO0001 Reserved for inherently not codable concepts without codable children: Secondary | ICD-10-CM

## 2015-10-15 DIAGNOSIS — K219 Gastro-esophageal reflux disease without esophagitis: Secondary | ICD-10-CM

## 2015-10-15 DIAGNOSIS — R140 Abdominal distension (gaseous): Secondary | ICD-10-CM

## 2015-10-15 DIAGNOSIS — Z1212 Encounter for screening for malignant neoplasm of rectum: Secondary | ICD-10-CM | POA: Diagnosis not present

## 2015-10-15 DIAGNOSIS — Z113 Encounter for screening for infections with a predominantly sexual mode of transmission: Secondary | ICD-10-CM

## 2015-10-15 DIAGNOSIS — R1084 Generalized abdominal pain: Secondary | ICD-10-CM

## 2015-10-15 DIAGNOSIS — M797 Fibromyalgia: Secondary | ICD-10-CM

## 2015-10-15 HISTORY — DX: Reserved for inherently not codable concepts without codable children: IMO0001

## 2015-10-15 HISTORY — DX: Candidiasis, unspecified: B37.9

## 2015-10-15 HISTORY — DX: Abdominal distension (gaseous): R14.0

## 2015-10-15 LAB — HEMOCCULT GUIAC POC 1CARD (OFFICE): Fecal Occult Blood, POC: NEGATIVE

## 2015-10-15 LAB — POCT WET PREP (WET MOUNT)

## 2015-10-15 MED ORDER — FLUCONAZOLE 150 MG PO TABS: ORAL_TABLET | ORAL | Status: DC

## 2015-10-15 NOTE — Patient Instructions (Signed)
Get labs today [place] 5/23 at 8:30 at [facility] Get mammogram Follow up in 2 weeks  Physical in 1 year

## 2015-10-15 NOTE — Progress Notes (Signed)
[name], female   DOB: 08-Jun-1970, 45 y.o.   MRN: [id number] History of Present Illness: Yvette Mayer is a 45 year old black female in for a well woman gyn exam and she complains of stomach pain,body aches, reflux and vaginal discomfort and gas and bloating, has missed appt with GI.She says she has fibromyalgia and works third shift.She requests labs and STD testing. She is sp hysterectomy. She recently stopped Prilosec and is now trying Pepcid, has had hiatal hernia in the past.  PCP is Dr Buelah Manis.   Current Medications, Allergies, Past Medical History, Past Surgical History, Family History and Social History were reviewed in [employer] record.     Review of Systems: Patient denies any headaches, hearing loss, fatigue, blurred vision, shortness of breath, chest pain, problems with bowel movements, urination, or intercourse. No joint pain or mood swings.See HPI for positives.   Physical Exam:BP 92/60 mmHg  Pulse 54  Ht 5\' 4"  (1.626 m)  Wt 156 lb 8 oz (70.988 kg)  BMI 26.85 kg/m2 General:  Well developed, well nourished, no acute distress Skin:  Warm and dry,no rashes Neck:  Midline trachea, normal thyroid, good ROM, no lymphadenopathy Lungs; Clear to auscultation bilaterally Breast:  No dominant palpable mass, retraction, or nipple discharge Cardiovascular: Regular rate and rhythm Abdomen:  Soft, non tender, no hepatosplenomegaly, she thinks GB has been removed. Pelvic:  External genitalia is normal in appearance, no lesions.  The vagina has white grainy discharge with red side walls. Urethra has no lesions or masses. The cervix and uterus absent. No adnexal masses, some tenderness noted.Bladder is non tender, no masses felt. Wet prep: +yeast Rectal: Good sphincter tone, no polyps, or hemorrhoids felt.  Hemoccult negative. Extremities/musculoskeletal:  No swelling or varicosities noted, no clubbing or cyanosis Psych:  No mood changes, alert and  cooperative,seems happy   Impression: Well woman gyn exam no pap Abdominal pain Reflux Vitamin D def fibromyalgia  Vaginal discharge  Yeast infection Gas STD screening    Plan: Check CBC,CMP,TSH and lipids,A1c, vitamin D,HIV,RPR,HSV 2 and GC/CHL sent on urine Rx diflucan 150 mg #2 take 1 now and repeat 1 in 3 days with 1 refill Will get abdominal and pelvic US 5/23 at 8:30 am at [facility] Follow up in 2 weeks Physical in 1 year Get mammogram Make appt with GI

## 2015-10-16 ENCOUNTER — Telehealth: Payer: Self-pay | Admitting: Adult Health

## 2015-10-16 ENCOUNTER — Encounter: Payer: Self-pay | Admitting: Adult Health

## 2015-10-16 LAB — HEMOGLOBIN A1C
ESTIMATED AVERAGE GLUCOSE: 111 mg/dL
Hgb A1c MFr Bld: 5.5 % (ref 4.8–5.6)

## 2015-10-16 LAB — TSH: TSH: 5.3 u[IU]/mL — AB (ref 0.450–4.500)

## 2015-10-16 LAB — COMPREHENSIVE METABOLIC PANEL
ALBUMIN: 4.3 g/dL (ref 3.5–5.5)
ALK PHOS: 86 IU/L (ref 39–117)
ALT: 14 IU/L (ref 0–32)
AST: 22 IU/L (ref 0–40)
Albumin/Globulin Ratio: 1.2 (ref 1.2–2.2)
BILIRUBIN TOTAL: 0.2 mg/dL (ref 0.0–1.2)
BUN / CREAT RATIO: 19 (ref 9–23)
BUN: 13 mg/dL (ref 6–24)
CHLORIDE: 100 mmol/L (ref 96–106)
CO2: 25 mmol/L (ref 18–29)
Calcium: 9.5 mg/dL (ref 8.7–10.2)
Creatinine, Ser: 0.69 mg/dL (ref 0.57–1.00)
GFR calc Af Amer: 122 mL/min/{1.73_m2} (ref >59)
GFR calc non Af Amer: 105 mL/min/{1.73_m2} (ref >59)
Globulin, Total: 3.5 g/dL (ref 1.5–4.5)
Glucose: 86 mg/dL (ref 65–99)
Potassium: 4.3 mmol/L (ref 3.5–5.2)
Sodium: 141 mmol/L (ref 134–144)
Total Protein: 7.8 g/dL (ref 6.0–8.5)

## 2015-10-16 LAB — CBC
Hematocrit: 38.6 % (ref 34.0–46.6)
Hemoglobin: 12.9 g/dL (ref 11.1–15.9)
MCH: 31.2 pg (ref 26.6–33.0)
MCHC: 33.4 g/dL (ref 31.5–35.7)
MCV: 93 fL (ref 79–97)
PLATELETS: 332 10*3/uL (ref 150–379)
RBC: 4.14 x10E6/uL (ref 3.77–5.28)
RDW: 13.5 % (ref 12.3–15.4)
WBC: 3.8 10*3/uL (ref 3.4–10.8)

## 2015-10-16 LAB — RPR: RPR: NONREACTIVE

## 2015-10-16 LAB — LIPID PANEL
Chol/HDL Ratio: 3.1 ratio units (ref 0.0–4.4)
Cholesterol, Total: 212 mg/dL — ABNORMAL HIGH (ref 100–199)
HDL: 68 mg/dL (ref >39)
LDL Calculated: 119 mg/dL — ABNORMAL HIGH (ref 0–99)
Triglycerides: 125 mg/dL (ref 0–149)
VLDL Cholesterol Cal: 25 mg/dL (ref 5–40)

## 2015-10-16 LAB — VITAMIN D 25 HYDROXY (VIT D DEFICIENCY, FRACTURES): VIT D 25 HYDROXY: 39.5 ng/mL (ref 30.0–100.0)

## 2015-10-16 LAB — HIV ANTIBODY (ROUTINE TESTING W REFLEX): HIV Screen 4th Generation wRfx: NONREACTIVE

## 2015-10-16 LAB — GC/CHLAMYDIA PROBE AMP
CHLAMYDIA, DNA PROBE: NEGATIVE
Neisseria gonorrhoeae by PCR: NEGATIVE

## 2015-10-16 LAB — HSV 2 ANTIBODY, IGG: HSV 2 Glycoprotein G Ab, IgG: 20.7 index — ABNORMAL HIGH (ref 0.00–0.90)

## 2015-10-16 NOTE — Telephone Encounter (Signed)
Pt aware of labs, will check TSH in 3 months, in recall. She was aware of +HSV.

## 2015-10-16 NOTE — Telephone Encounter (Signed)
Left message to call about labs 

## 2015-10-18 ENCOUNTER — Encounter: Payer: BLUE CROSS/BLUE SHIELD | Admitting: Family Medicine

## 2015-10-22 ENCOUNTER — Ambulatory Visit (HOSPITAL_COMMUNITY): Admission: RE | Admit: 2015-10-22 | Payer: BLUE CROSS/BLUE SHIELD | Source: Ambulatory Visit

## 2015-10-23 ENCOUNTER — Ambulatory Visit (HOSPITAL_COMMUNITY): Admission: RE | Admit: 2015-10-23 | Payer: BLUE CROSS/BLUE SHIELD | Source: Ambulatory Visit

## 2015-10-24 ENCOUNTER — Ambulatory Visit (HOSPITAL_COMMUNITY)
Admission: RE | Admit: 2015-10-24 | Discharge: 2015-10-24 | Disposition: A | Discharge status: Home / Self Care | Payer: BLUE CROSS/BLUE SHIELD | Source: Ambulatory Visit | Attending: Adult Health | Admitting: Adult Health

## 2015-10-24 ENCOUNTER — Telehealth: Payer: Self-pay | Admitting: Adult Health

## 2015-10-24 ENCOUNTER — Encounter: Payer: Self-pay | Admitting: Adult Health

## 2015-10-24 DIAGNOSIS — R109 Unspecified abdominal pain: Secondary | ICD-10-CM | POA: Diagnosis not present

## 2015-10-24 DIAGNOSIS — N200 Calculus of kidney: Secondary | ICD-10-CM

## 2015-10-24 DIAGNOSIS — R140 Abdominal distension (gaseous): Secondary | ICD-10-CM | POA: Insufficient documentation

## 2015-10-24 DIAGNOSIS — Z90710 Acquired absence of both cervix and uterus: Secondary | ICD-10-CM | POA: Diagnosis not present

## 2015-10-24 DIAGNOSIS — R1084 Generalized abdominal pain: Secondary | ICD-10-CM

## 2015-10-24 HISTORY — DX: Calculus of kidney: N20.0

## 2015-10-24 NOTE — Telephone Encounter (Signed)
Pt aware of [place] results and that has right kidney stone, non obstructing

## 2015-10-29 ENCOUNTER — Ambulatory Visit: Payer: BLUE CROSS/BLUE SHIELD | Admitting: Adult Health

## 2015-10-30 DIAGNOSIS — S9032XA Contusion of left foot, initial encounter: Secondary | ICD-10-CM | POA: Diagnosis not present

## 2015-10-30 DIAGNOSIS — T1490 Injury, unspecified: Secondary | ICD-10-CM | POA: Diagnosis not present

## 2015-11-05 ENCOUNTER — Other Ambulatory Visit: Payer: Self-pay | Admitting: Adult Health

## 2015-11-05 DIAGNOSIS — Z1231 Encounter for screening mammogram for malignant neoplasm of breast: Secondary | ICD-10-CM

## 2015-11-07 ENCOUNTER — Ambulatory Visit: Payer: BLUE CROSS/BLUE SHIELD | Admitting: Adult Health

## 2015-11-18 ENCOUNTER — Ambulatory Visit (HOSPITAL_COMMUNITY)
Admission: RE | Admit: 2015-11-18 | Discharge: 2015-11-18 | Disposition: A | Discharge status: Home / Self Care | Payer: BLUE CROSS/BLUE SHIELD | Source: Ambulatory Visit | Attending: Adult Health | Admitting: Adult Health

## 2015-11-18 DIAGNOSIS — Z1231 Encounter for screening mammogram for malignant neoplasm of breast: Secondary | ICD-10-CM | POA: Diagnosis not present

## 2015-12-20 ENCOUNTER — Other Ambulatory Visit: Payer: Self-pay | Admitting: Family Medicine

## 2015-12-23 NOTE — Telephone Encounter (Signed)
Refill appropriate and filled per protocol. 

## 2015-12-30 ENCOUNTER — Ambulatory Visit: Payer: BLUE CROSS/BLUE SHIELD | Admitting: Adult Health

## 2016-01-16 ENCOUNTER — Ambulatory Visit: Payer: BLUE CROSS/BLUE SHIELD | Admitting: Adult Health

## 2016-01-20 ENCOUNTER — Ambulatory Visit (INDEPENDENT_AMBULATORY_CARE_PROVIDER_SITE_OTHER): Payer: BLUE CROSS/BLUE SHIELD | Admitting: Family Medicine

## 2016-01-20 ENCOUNTER — Encounter: Payer: Self-pay | Admitting: Family Medicine

## 2016-01-20 VITALS — BP 118/68 | HR 82 | Temp 98.0°F | Resp 16 | Ht 64.0 in | Wt 158.0 lb

## 2016-01-20 DIAGNOSIS — N760 Acute vaginitis: Secondary | ICD-10-CM

## 2016-01-20 DIAGNOSIS — N644 Mastodynia: Secondary | ICD-10-CM

## 2016-01-20 DIAGNOSIS — R7989 Other specified abnormal findings of blood chemistry: Secondary | ICD-10-CM

## 2016-01-20 DIAGNOSIS — E785 Hyperlipidemia, unspecified: Secondary | ICD-10-CM

## 2016-01-20 LAB — LIPID PANEL
Cholesterol: 188 mg/dL (ref 125–200)
HDL: 65 mg/dL (ref >=46)
LDL Cholesterol: 97 mg/dL (ref <130)
Total CHOL/HDL Ratio: 2.9 Ratio (ref <=5.0)
Triglycerides: 130 mg/dL (ref <150)
VLDL: 26 mg/dL (ref <30)

## 2016-01-20 LAB — WET PREP FOR TRICH, YEAST, CLUE
Clue Cells Wet Prep HPF POC: NONE SEEN
TRICH WET PREP: NONE SEEN
YEAST WET PREP: NONE SEEN

## 2016-01-20 LAB — TSH: TSH: 2.78 m[IU]/L

## 2016-01-20 LAB — T3, FREE: T3 FREE: 3.2 pg/mL (ref 2.3–4.2)

## 2016-01-20 LAB — T4, FREE: FREE T4: 0.9 ng/dL (ref 0.8–1.8)

## 2016-01-20 NOTE — Patient Instructions (Signed)
F/u AS NEEDED  We will call with lab results

## 2016-01-20 NOTE — Assessment & Plan Note (Signed)
Recheck labs on fish oil

## 2016-01-20 NOTE — Progress Notes (Signed)
   Subjective:    [name], female    DOB: 04-27-1971, 45 y.o.   MRN: [id number]  Patient presents for Vaginal Irritation (x1 week- white discharge and itchiness) and Nipple Soreness (x1 week- reports increased soreness to B nipples)  Here with vaginal discharge irritation for the past week This is a common complaint for her. She is status post hysterectomy. She is typically followed by GYN. She did use Monistat last night. Sexually active with one partner. She denies any abdominal pain or dysuria  She's also had some nipple soreness of the past week denies any drainage from the nipple. Abnormal mammogram a couple months ago  She was seen by her GYN for physical exam back in May she had mildly abnormal TSH which needs to be repeated her cholesterol was also a little elevated she is taking sexual L now would like to have this rechecked    Review Of Systems:  GEN- denies fatigue, fever, weight loss,weakness, recent illness HEENT- denies eye drainage, change in vision, nasal discharge, CVS- denies chest pain, palpitations RESP- denies SOB, cough, wheeze ABD- denies N/V, change in stools, abd pain GU- denies dysuria, hematuria, dribbling, incontinence MSK- denies joint pain, muscle aches, injury Neuro- denies headache, dizziness, syncope, seizure activity       Objective:    BP 118/68 (BP Location: Left Arm, Patient Position: Sitting, Cuff Size: Normal)   Pulse 82   Temp 98 F (36.7 C) (Oral)   Resp 16   Ht 5\' 4"  (1.626 m)   Wt 158 lb (71.7 kg)   BMI 27.12 kg/m  GEN- NAD, alert and oriented x3 Neck- no thyromegaly  Breast- normal symmetry, no nipple inversion,no nipple drainage, no nodules or lumps felt Nodes- no axillary nodes GU- normal external genitalia, vaginal mucosa pink and moist, no cervix  n, + white  Discharge - mostly monistat, no CMT, no ovarian masses, ABD-NABS,soft,NT,ND EXT- No edema Pulses- Radial, DP- 2+        Assessment & Plan:      Problem List Items Addressed This Visit    Hyperlipidemia    Recheck labs on fish oil        Relevant Orders   Lipid panel    Other Visit Diagnoses    Abnormal TSH    -  Primary   recheck TFT   Relevant Orders   TSH   T3, free   T4, free   Vaginitis and vulvovaginitis       check wet prep and GC at patient request, has already used monistat   Relevant Orders   WET PREP FOR [place], YEAST, CLUE   GC/Chlamydia Probe Amp   Soreness breast        normal breast exam, advised to decrease caffiene which can also cause some breast soreness, normal mammogram 2 months ago, no discharge from nipple       Note: This dictation was prepared with Dragon dictation along with smaller phrase technology. Any transcriptional errors that result from this process are unintentional.

## 2016-01-20 NOTE — Addendum Note (Signed)
Addended by: Sheral Flow on: 01/20/2016 04:11 PM   Modules accepted: Orders

## 2016-01-21 LAB — GC/CHLAMYDIA PROBE AMP
CT PROBE, AMP APTIMA: NOT DETECTED
GC PROBE AMP APTIMA: NOT DETECTED

## 2016-01-22 ENCOUNTER — Ambulatory Visit: Payer: BLUE CROSS/BLUE SHIELD | Admitting: Family Medicine

## 2016-01-27 ENCOUNTER — Ambulatory Visit (INDEPENDENT_AMBULATORY_CARE_PROVIDER_SITE_OTHER): Payer: BLUE CROSS/BLUE SHIELD | Admitting: Physician Assistant

## 2016-01-27 ENCOUNTER — Encounter: Payer: Self-pay | Admitting: Physician Assistant

## 2016-01-27 VITALS — BP 122/64 | HR 52 | Temp 97.5°F | Resp 16 | Wt 158.0 lb

## 2016-01-27 DIAGNOSIS — M545 Low back pain, unspecified: Secondary | ICD-10-CM

## 2016-01-27 DIAGNOSIS — R350 Frequency of micturition: Secondary | ICD-10-CM | POA: Diagnosis not present

## 2016-01-27 LAB — URINALYSIS, ROUTINE W REFLEX MICROSCOPIC
BILIRUBIN URINE: NEGATIVE
GLUCOSE, UA: NEGATIVE
Ketones, ur: NEGATIVE
Leukocytes, UA: NEGATIVE
Nitrite: NEGATIVE
PH: 6.5 (ref 5.0–8.0)
Protein, ur: NEGATIVE
SPECIFIC GRAVITY, URINE: 1.015 (ref 1.001–1.035)

## 2016-01-27 LAB — URINALYSIS, MICROSCOPIC ONLY
CRYSTALS: NONE SEEN [HPF]
Casts: NONE SEEN [LPF]
Yeast: NONE SEEN [HPF]

## 2016-01-27 MED ORDER — CYCLOBENZAPRINE HCL 5 MG PO TABS: 5.0000 mg | ORAL_TABLET | Freq: Every day | ORAL | 2 refills | Status: DC

## 2016-01-27 NOTE — Progress Notes (Signed)
[name] MRN: [id number], DOB: 1971/01/11, 45 y.o. Date of Encounter: 01/27/2016, 11:47 AM    Chief Complaint:  Chief Complaint  Patient presents with  . Acute Visit    urine pain frequency history of kidney stones x 2wks, lower back and abdomen pain     HPI: 45 y.o. year old female presents with above.   Says that in May GYN did ultrasound of her abdomen. Says that last week they checked for STDs and that was negative.  Says that she is having discomfort in her low abdomen, low back, and what she refers to as "hips" but points to both sides of her low back. Says that her "vagina feels flared up "  Reviewed that on 01/20/16 she had gonorrhea Chlamydia negative wet prep negative. Reveiwed that 10/24/2015 she had ultrasound of abdomen transvaginal ultrasound and ultrasound pelvis. Reviewed those findings.  She says that for her job she stands for 6 and half hours. Has 3  30 minute breaks. Had her demonstrate the movements. She is standing forward and packing---  she is standing forward and moving her arms from middle over to the right --- then middle over to the left ----side to side.  Discussed with her that I think that most of her symptoms are musculoskeletal. From tight muscles in her low back region. Asked if she is using Flexeril. Says that she is out of this. Recommend that she use Flexeril on a regular basis apply heat to the area using heating pad and warm water in the shower and also needs to stretch. She has follow-up appointment with her GYN.     Home Meds:   Outpatient Medications Prior to Visit  Medication Sig Dispense Refill  . pilocarpine (SALAGEN) 5 MG tablet 5 mg 2 (two) times daily.     . cholecalciferol (VITAMIN D) 1000 units tablet Take 1,000 Units by mouth daily.    . famotidine (PEPCID) 10 MG tablet Take 10 mg by mouth 2 (two) times daily.    . naproxen sodium (ALEVE) 220 MG tablet Take 440 mg by mouth as needed.     . Omega-3 Fatty  Acids (FISH OIL PO) Take by mouth as needed.     . valACYclovir (VALTREX) 1000 MG tablet TAKE ONE TABLET BY MOUTH TWICE DAILY (Patient not taking: Reported on 01/27/2016) 14 tablet 0  . cyclobenzaprine (FLEXERIL) 5 MG tablet Take 1 tablet (5 mg total) by mouth at bedtime. (Patient not taking: Reported on 01/20/2016) 30 tablet 0   No facility-administered medications prior to visit.     Allergies:  Allergies  Allergen Reactions  . Hydrocodone-Acetaminophen Itching  . Percocet [Oxycodone-Acetaminophen] Itching  . Plaquenil [Hydroxychloroquine Sulfate] Rash      Review of Systems: See HPI for pertinent ROS. All other ROS negative.    Physical Exam: Blood pressure 122/64, pulse (!) 52, temperature 97.5 F (36.4 C), temperature source Oral, resp. rate 16, weight 158 lb (71.7 kg)., Body mass index is 27.12 kg/m. General:  WNWD AAF. Appears in no acute distress. Neck: Supple. No thyromegaly. No lymphadenopathy. Lungs: Clear bilaterally to auscultation without wheezes, rales, or rhonchi. Breathing is unlabored. Heart: Regular rhythm. No murmurs, rubs, or gallops. Abdomen: Soft, non-tender, non-distended with normoactive bowel sounds. No hepatomegaly. No rebound/guarding. No obvious abdominal masses. Msk:  Strength and tone normal for age. Extremities/Skin: Warm and dry. Neuro: Alert and oriented X 3. Moves all extremities spontaneously. Gait is normal. CNII-XII grossly in tact. Psych:  Responds  to questions appropriately with a normal affect.      ASSESSMENT AND PLAN:  45 y.o. year old female with  1. Frequency of urination - Urinalysis Dipstick---normal  2. Bilateral low back pain without sciatica - cyclobenzaprine (FLEXERIL) 5 MG tablet; Take 1 tablet (5 mg total) by mouth at bedtime.  Dispense: 30 tablet; Refill: 2  Discussed with her that I think that most of her symptoms are musculoskeletal. From tight muscles in her low back region. Asked if she is using Flexeril. Says that  she is out of this. Recommend that she use Flexeril on a regular basis apply heat to the area using heating pad and warm water in the shower and also needs to stretch. She has follow-up appointment with her GYN.  Signed, [id number] [place], [place], [facility] 01/27/2016 11:47 AM

## 2016-02-04 ENCOUNTER — Ambulatory Visit: Payer: BLUE CROSS/BLUE SHIELD | Admitting: Adult Health

## 2016-02-11 ENCOUNTER — Ambulatory Visit: Payer: BLUE CROSS/BLUE SHIELD | Admitting: Adult Health

## 2016-02-19 DIAGNOSIS — M9902 Segmental and somatic dysfunction of thoracic region: Secondary | ICD-10-CM | POA: Diagnosis not present

## 2016-02-19 DIAGNOSIS — M546 Pain in thoracic spine: Secondary | ICD-10-CM | POA: Diagnosis not present

## 2016-02-19 DIAGNOSIS — M9901 Segmental and somatic dysfunction of cervical region: Secondary | ICD-10-CM | POA: Diagnosis not present

## 2016-02-19 DIAGNOSIS — M542 Cervicalgia: Secondary | ICD-10-CM | POA: Diagnosis not present

## 2016-03-06 DIAGNOSIS — M542 Cervicalgia: Secondary | ICD-10-CM | POA: Diagnosis not present

## 2016-03-06 DIAGNOSIS — M9901 Segmental and somatic dysfunction of cervical region: Secondary | ICD-10-CM | POA: Diagnosis not present

## 2016-03-06 DIAGNOSIS — M9902 Segmental and somatic dysfunction of thoracic region: Secondary | ICD-10-CM | POA: Diagnosis not present

## 2016-03-06 DIAGNOSIS — M546 Pain in thoracic spine: Secondary | ICD-10-CM | POA: Diagnosis not present

## 2016-03-09 ENCOUNTER — Ambulatory Visit: Payer: BLUE CROSS/BLUE SHIELD | Admitting: Adult Health

## 2016-03-13 DIAGNOSIS — M542 Cervicalgia: Secondary | ICD-10-CM | POA: Diagnosis not present

## 2016-03-13 DIAGNOSIS — M546 Pain in thoracic spine: Secondary | ICD-10-CM | POA: Diagnosis not present

## 2016-03-13 DIAGNOSIS — M9902 Segmental and somatic dysfunction of thoracic region: Secondary | ICD-10-CM | POA: Diagnosis not present

## 2016-03-13 DIAGNOSIS — M9901 Segmental and somatic dysfunction of cervical region: Secondary | ICD-10-CM | POA: Diagnosis not present

## 2016-04-15 ENCOUNTER — Ambulatory Visit (INDEPENDENT_AMBULATORY_CARE_PROVIDER_SITE_OTHER): Payer: BLUE CROSS/BLUE SHIELD | Admitting: Adult Health

## 2016-04-15 ENCOUNTER — Ambulatory Visit: Payer: BLUE CROSS/BLUE SHIELD | Admitting: Adult Health

## 2016-04-15 ENCOUNTER — Encounter: Payer: Self-pay | Admitting: Adult Health

## 2016-04-15 VITALS — BP 104/60 | HR 68 | Ht 65.0 in | Wt 157.0 lb

## 2016-04-15 DIAGNOSIS — K219 Gastro-esophageal reflux disease without esophagitis: Secondary | ICD-10-CM | POA: Diagnosis not present

## 2016-04-15 DIAGNOSIS — Z113 Encounter for screening for infections with a predominantly sexual mode of transmission: Secondary | ICD-10-CM

## 2016-04-15 DIAGNOSIS — N898 Other specified noninflammatory disorders of vagina: Secondary | ICD-10-CM

## 2016-04-15 LAB — POCT WET PREP (WET MOUNT)
CLUE CELLS WET PREP WHIFF POC: NEGATIVE
Trichomonas Wet Prep HPF POC: ABSENT
WBC, Wet Prep HPF POC: POSITIVE

## 2016-04-15 MED ORDER — FAMOTIDINE 20 MG PO TABS: 20.0000 mg | ORAL_TABLET | Freq: Every day | ORAL | 6 refills | Status: DC

## 2016-04-15 NOTE — Patient Instructions (Signed)
Follow up prn
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
## 2016-04-17 LAB — GC/CHLAMYDIA PROBE AMP
CHLAMYDIA, DNA PROBE: NEGATIVE
NEISSERIA GONORRHOEAE BY PCR: NEGATIVE

## 2016-05-03 ENCOUNTER — Encounter (HOSPITAL_COMMUNITY): Payer: Self-pay | Admitting: Emergency Medicine

## 2016-05-03 ENCOUNTER — Emergency Department (HOSPITAL_COMMUNITY)
Admission: EM | Admit: 2016-05-03 | Discharge: 2016-05-03 | Disposition: A | Discharge status: Home / Self Care | Payer: BLUE CROSS/BLUE SHIELD | Attending: Emergency Medicine | Admitting: Emergency Medicine

## 2016-05-03 DIAGNOSIS — Z79899 Other long term (current) drug therapy: Secondary | ICD-10-CM | POA: Diagnosis not present

## 2016-05-03 DIAGNOSIS — M25552 Pain in left hip: Secondary | ICD-10-CM | POA: Insufficient documentation

## 2016-05-03 DIAGNOSIS — J45909 Unspecified asthma, uncomplicated: Secondary | ICD-10-CM | POA: Diagnosis not present

## 2016-05-03 MED ORDER — IBUPROFEN 600 MG PO TABS: 600.0000 mg | ORAL_TABLET | Freq: Four times a day (QID) | ORAL | 0 refills | Status: DC | PRN

## 2016-05-03 MED ORDER — KETOROLAC TROMETHAMINE 30 MG/ML IJ SOLN
30.0000 mg | Freq: Once | INTRAMUSCULAR | Status: AC
  Administered 2016-05-03: 30 mg via INTRAMUSCULAR
  Filled 2016-05-03: qty 1
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
## 2016-05-03 NOTE — ED Triage Notes (Signed)
Pt c/o left hip pain x 1 week. Denies injury.
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
## 2016-05-26 ENCOUNTER — Emergency Department (HOSPITAL_COMMUNITY): Payer: BLUE CROSS/BLUE SHIELD

## 2016-05-26 ENCOUNTER — Emergency Department (HOSPITAL_COMMUNITY)
Admission: EM | Admit: 2016-05-26 | Discharge: 2016-05-26 | Disposition: A | Discharge status: Home / Self Care | Payer: BLUE CROSS/BLUE SHIELD | Attending: Emergency Medicine | Admitting: Emergency Medicine

## 2016-05-26 ENCOUNTER — Encounter (HOSPITAL_COMMUNITY): Payer: Self-pay | Admitting: *Deleted

## 2016-05-26 DIAGNOSIS — J45909 Unspecified asthma, uncomplicated: Secondary | ICD-10-CM | POA: Diagnosis not present

## 2016-05-26 DIAGNOSIS — K5900 Constipation, unspecified: Secondary | ICD-10-CM | POA: Diagnosis not present

## 2016-05-26 DIAGNOSIS — R1084 Generalized abdominal pain: Secondary | ICD-10-CM | POA: Diagnosis not present

## 2016-05-26 DIAGNOSIS — Z791 Long term (current) use of non-steroidal anti-inflammatories (NSAID): Secondary | ICD-10-CM | POA: Diagnosis not present
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
## 2016-05-26 NOTE — ED Triage Notes (Signed)
Pt c/o generalized abdominal pain and constipation for the last couple of days

## 2016-05-26 NOTE — Discharge Instructions (Signed)
Use dulcolax suppositories to soften the stool. You can also use a fleets enema OTC. Get miralax and put one dose or 17 g in 8 ounces of water,  take 1 dose every 30 minutes for 2-3 hours or until you  get good results and then once or twice daily to prevent constipation.  Recheck if you get a fever, vomiting or worsening pain.

## 2016-05-31 ENCOUNTER — Encounter (HOSPITAL_COMMUNITY): Payer: Self-pay | Admitting: Emergency Medicine

## 2016-05-31 ENCOUNTER — Emergency Department (HOSPITAL_COMMUNITY): Payer: BLUE CROSS/BLUE SHIELD

## 2016-05-31 ENCOUNTER — Emergency Department (HOSPITAL_COMMUNITY)
Admission: EM | Admit: 2016-05-31 | Discharge: 2016-05-31 | Disposition: A | Discharge status: Home / Self Care | Payer: BLUE CROSS/BLUE SHIELD | Attending: Emergency Medicine | Admitting: Emergency Medicine

## 2016-05-31 DIAGNOSIS — J45909 Unspecified asthma, uncomplicated: Secondary | ICD-10-CM | POA: Insufficient documentation

## 2016-05-31 DIAGNOSIS — R1084 Generalized abdominal pain: Secondary | ICD-10-CM | POA: Diagnosis present

## 2016-05-31 DIAGNOSIS — Z79899 Other long term (current) drug therapy: Secondary | ICD-10-CM | POA: Insufficient documentation

## 2016-05-31 DIAGNOSIS — R10817 Generalized abdominal tenderness: Secondary | ICD-10-CM | POA: Diagnosis not present

## 2016-05-31 DIAGNOSIS — K5900 Constipation, unspecified: Secondary | ICD-10-CM | POA: Diagnosis not present

## 2016-05-31 DIAGNOSIS — K2970 Gastritis, unspecified, without bleeding: Secondary | ICD-10-CM | POA: Diagnosis not present

## 2016-05-31 DIAGNOSIS — R112 Nausea with vomiting, unspecified: Secondary | ICD-10-CM | POA: Diagnosis not present

## 2016-05-31 MED ORDER — KETOROLAC TROMETHAMINE 30 MG/ML IJ SOLN
60.0000 mg | Freq: Once | INTRAMUSCULAR | Status: AC
  Administered 2016-05-31: 60 mg via INTRAMUSCULAR
  Filled 2016-05-31: qty 2

## 2016-05-31 MED ORDER — DICYCLOMINE HCL 10 MG/ML IM SOLN
20.0000 mg | Freq: Once | INTRAMUSCULAR | Status: AC
  Administered 2016-05-31: 20 mg via INTRAMUSCULAR
  Filled 2016-05-31: qty 2

## 2016-05-31 MED ORDER — DOCUSATE SODIUM 100 MG PO CAPS: 100.0000 mg | ORAL_CAPSULE | Freq: Two times a day (BID) | ORAL | 1 refills | Status: DC

## 2016-05-31 MED ORDER — ONDANSETRON 4 MG PO TBDP: 4.0000 mg | ORAL_TABLET | Freq: Three times a day (TID) | ORAL | 0 refills | Status: DC | PRN

## 2016-05-31 MED ORDER — ONDANSETRON 8 MG PO TBDP
8.0000 mg | ORAL_TABLET | Freq: Once | ORAL | Status: AC
  Administered 2016-05-31: 8 mg via ORAL
  Filled 2016-05-31: qty 1

## 2016-05-31 MED ORDER — DICYCLOMINE HCL 20 MG PO TABS: 20.0000 mg | ORAL_TABLET | Freq: Three times a day (TID) | ORAL | 0 refills | Status: DC

## 2016-05-31 MED ORDER — POLYETHYLENE GLYCOL 3350 17 G PO PACK: 17.0000 g | PACK | Freq: Two times a day (BID) | ORAL | 1 refills | Status: DC

## 2016-05-31 NOTE — ED Triage Notes (Signed)
Pt states she was seen here on 12/26 for abd pain and was told she was constipated.  Pt states she has been taking laxatives and an enema but has not been able to have a BM.  Having increased abd pain over last 3 days

## 2016-05-31 NOTE — Discharge Instructions (Signed)
Please take MiraLAX 17 g in a large glass of water twice a day. Please take stool softener Colace (also called docusate sodium) 100 mg twice a day. Please take either Benefiber or Metamucil once a day. You may hold these medications if he began having diarrhea. Please increase your fiber intake and water intake at home. You may alternate between Tylenol 1000 mg every 6 hours as needed for pain and ibuprofen 800 mg every 8 hours as needed for pain. Please take ibuprofen with food.  You may also use over-the-counter magnesium citrate and over-the-counter Fleet enemas once daily as needed for constipation.

## 2016-05-31 NOTE — ED Notes (Signed)
Pt given water to drink and was able to keep it down. Pt walked out to waiting room upon discharge. Pt stating she didn't need a wheelchair.

## 2016-05-31 NOTE — ED Provider Notes (Signed)
TIME SEEN: 4:05 AM  CHIEF COMPLAINT: Abdominal pain  HPI: Pt is a 45 y.o. female who presents the emergency department with diffuse crampy, sharp "severe" abdominal pain. Was seen here on December 26 for the same. States pain has progressively gotten worse. She states that she has been constipated. Has not had a normal bowel movement in a week. Is still passing gas. Began vomiting tonight. No fevers, chills, dysuria, hematuria, vaginal bleeding or discharge. States on the 26th she did take MiraLAX every 30 minutes for the first 2 hours and had a small bowel movement. She used an enema that day and again tonight without any relief. She has not been taking a bowel regimen daily. No history of bowel obstruction but has had a cholecystectomy, C-section, hysterectomy.  ROS: See HPI Constitutional: no fever  Eyes: no drainage  ENT: no runny nose   Cardiovascular:  no chest pain  Resp: no SOB  GI:  vomiting GU: no dysuria Integumentary: no rash  Allergy: no hives  Musculoskeletal: no leg swelling  Neurological: no slurred speech ROS otherwise negative  PAST MEDICAL HISTORY/PAST SURGICAL HISTORY:  Past Medical History:  Diagnosis Date  . Abdominal pain 06/12/2014  . Allergy   . Anemia    iron 1 yr ago, normal hgb 7/12  . Anxiety   . Asthma    pt says no-bronchitis  . Bloated abdomen 10/15/2015  . Chronic back pain   . Chronic headaches   . Chronic neck pain   . Chronic sinusitis   . Current use of estrogen therapy 04/16/2015  . Dyspareunia 09/12/2014  . Elevated TSH 04/16/2015  . Fatigue 04/10/2015  . Fibromyalgia   . Gas 10/15/2015  . GERD (gastroesophageal reflux disease)   . Herpes simplex virus (HSV) infection   . Hot flashes 04/10/2015  . Hyperlipidemia   . Kidney stone on right side 10/24/2015  . Mental disorder   . Ovarian cyst, right   . Paresthesias   . Shortness of breath   . Sinus drainage   . Sjoegren syndrome ([place])   . Symptoms, such as flushing, sleeplessness,  headache, lack of concentration, associated with the menopause 04/10/2015  . TMJ (dislocation of temporomandibular joint)   . Vaginal discharge 08/08/2015  . Vertigo   . Yeast infection 10/15/2015    MEDICATIONS:  Prior to Admission medications   Medication Sig Start Date End Date Taking? Authorizing Provider  cyclobenzaprine (FLEXERIL) 5 MG tablet Take 1 tablet (5 mg total) by mouth at bedtime. 01/27/16   Lonie Peak Dixon, PA-C  famotidine (PEPCID) 20 MG tablet Take 1 tablet (20 mg total) by mouth daily. 04/15/16   Estill Dooms, NP  ibuprofen (ADVIL,MOTRIN) 600 MG tablet Take 1 tablet (600 mg total) by mouth every 6 (six) hours as needed. 05/03/16   Shary Decamp, PA-C  naproxen sodium (ALEVE) 220 MG tablet Take 440 mg by mouth as needed.     Historical Provider, MD  pilocarpine (SALAGEN) 5 MG tablet 5 mg 2 (two) times daily.  05/01/15   Historical Provider, MD  valACYclovir (VALTREX) 1000 MG tablet TAKE ONE TABLET BY MOUTH TWICE DAILY Patient taking differently: TAKE ONE TABLET BY MOUTH TWICE DAILY PRN 12/23/15   Alycia Rossetti, MD    ALLERGIES:  Allergies  Allergen Reactions  . Hydrocodone-Acetaminophen Itching  . Percocet [Oxycodone-Acetaminophen] Itching  . Plaquenil [Hydroxychloroquine Sulfate] Rash    SOCIAL HISTORY:  Social History  Substance Use Topics  . Smoking status: Never Smoker  . Smokeless  tobacco: Never Used  . Alcohol use No    FAMILY HISTORY: Family History  Problem Relation Age of Onset  . Hypertension Mother   . Thyroid disease Sister     overactive  . Bronchitis Son   . Heart disease Maternal Grandmother   . Hypertension Maternal Grandmother   . Alcohol abuse Maternal Grandfather   . Scoliosis Son   . Other Maternal Aunt     Sjeogren syndrome  . Other Maternal Aunt     Sjoegren syndrome  . Other Maternal Aunt     Sjoegren syndrome  . Anesthesia problems Neg Hx   . Malignant hyperthermia Neg Hx   . Hypotension Neg Hx   . Pseudochol deficiency Neg  Hx     EXAM: BP 106/65 (BP Location: Left Arm)   Pulse 72   Temp 97.9 F (36.6 C) (Oral)   Resp 20   Ht 5\' 5"  (1.651 m)   Wt 150 lb (68 kg)   SpO2 99%   BMI 24.96 kg/m  CONSTITUTIONAL: Alert and oriented and responds appropriately to questions. Appears uncomfortable but is afebrile and nontoxic, appears well hydrated; well-nourished HEAD: Normocephalic EYES: Conjunctivae clear, PERRL, EOMI ENT: normal nose; no rhinorrhea; moist mucous membranes NECK: Supple, no meningismus, no nuchal rigidity, no LAD  CARD: RRR; S1 and S2 appreciated; no murmurs, no clicks, no rubs, no gallops RESP: Normal chest excursion without splinting or tachypnea; breath sounds clear and equal bilaterally; no wheezes, no rhonchi, no rales, no hypoxia or respiratory distress, speaking full sentences ABD/GI: Decreased bowel sounds; non-distended; soft, very minimally tender to palpation throughout her abdomen, no rebound, no guarding, no peritoneal signs, no hepatosplenomegaly, no tympany or fluid wave RECTAL:  Normal rectal tone, no fecal impaction, no gross blood or melena, minimal soft brown stool in the rectal vault BACK:  The back appears normal and is non-tender to palpation, there is no CVA tenderness EXT: Normal ROM in all joints; non-tender to palpation; no edema; normal capillary refill; no cyanosis, no calf tenderness or swelling    SKIN: Normal color for age and race; warm; no rash NEURO: Moves all extremities equally, sensation to light touch intact diffusely, cranial nerves II through XII intact, normal speech PSYCH: The patient's mood and manner are appropriate. Grooming and personal hygiene are appropriate.  MEDICAL DECISION MAKING: Patient here with what appears to be constipation. Clinically it does not appear that she has a bowel obstruction and she has decreased bowel sounds, no distention and no tympany. She does however have history of multiple abdominal surgeries and is now vomiting. We will  repeat an x-ray to evaluate for any signs of bowel obstruction. No fecal impaction on exam. I do not feel an enema will be helpful and she does not have a large volume of stool in her rectal vault. I think that she needs to be ideally bowel regimen to get her bowels moving. Discussed with her that I do not feel narcotics are appropriate as it could make her symptoms worse. We'll try to treat her symptoms here with Toradol, Bentyl and Zofran. Doubt appendicitis, colitis, diverticulitis, pancreatitis.  ED PROGRESS: 5:15 AM  Pt's pain and nausea have improved. We will fluid challenge patient. Her x-ray shows moderate amount of retained large bowel stool with a normal bowel gas pattern and no sign of obstruction or free air. Abdominal exam is still benign. She has been able to pass a small amount of stool in the emergency department. I feel she is  safe to be discharged home. I discussed with her and her partner at bedside that she should use MiraLAX twice a day, Colace twice a day and Metamucil once a day. She has seen Dr. Oneida Alar in the past. Have recommended follow-up if symptoms continue. Discussed return precautions. They verbalized understanding and are comfortable with this plan.   At this time, I do not feel there is any life-threatening condition present. I have reviewed and discussed all results (EKG, imaging, lab, urine as appropriate) and exam findings with patient/family. I have reviewed nursing notes and appropriate previous records.  I feel the patient is safe to be discharged home without further emergent workup and can continue workup as an outpatient as needed. Discussed usual and customary return precautions. Patient/family verbalize understanding and are comfortable with this plan.  Outpatient follow-up has been provided. All questions have been answered.      [place], DO 05/31/16 [phone]

## 2016-06-11 ENCOUNTER — Encounter: Payer: Self-pay | Admitting: Adult Health

## 2016-06-11 ENCOUNTER — Ambulatory Visit (INDEPENDENT_AMBULATORY_CARE_PROVIDER_SITE_OTHER): Payer: BLUE CROSS/BLUE SHIELD | Admitting: Adult Health

## 2016-06-11 ENCOUNTER — Telehealth: Payer: Self-pay | Admitting: Family Medicine

## 2016-06-11 VITALS — BP 100/62 | HR 72 | Ht 65.0 in | Wt 158.0 lb

## 2016-06-11 DIAGNOSIS — N898 Other specified noninflammatory disorders of vagina: Secondary | ICD-10-CM | POA: Diagnosis not present

## 2016-06-11 DIAGNOSIS — L298 Other pruritus: Secondary | ICD-10-CM

## 2016-06-11 DIAGNOSIS — N760 Acute vaginitis: Secondary | ICD-10-CM | POA: Diagnosis not present

## 2016-06-11 DIAGNOSIS — B9689 Other specified bacterial agents as the cause of diseases classified elsewhere: Secondary | ICD-10-CM

## 2016-06-11 DIAGNOSIS — J0110 Acute frontal sinusitis, unspecified: Secondary | ICD-10-CM

## 2016-06-11 LAB — POCT WET PREP (WET MOUNT)
Clue Cells Wet Prep Whiff POC: NEGATIVE
WBC, Wet Prep HPF POC: POSITIVE

## 2016-06-11 MED ORDER — FLUCONAZOLE 150 MG PO TABS: 150.0000 mg | ORAL_TABLET | Freq: Once | ORAL | 1 refills | Status: AC

## 2016-06-11 MED ORDER — AZITHROMYCIN 250 MG PO TABS: ORAL_TABLET | ORAL | 0 refills | Status: DC

## 2016-06-11 MED ORDER — BENZONATATE 100 MG PO CAPS: 100.0000 mg | ORAL_CAPSULE | Freq: Three times a day (TID) | ORAL | 0 refills | Status: DC | PRN

## 2016-06-11 MED ORDER — METRONIDAZOLE 0.75 % VA GEL: 1.0000 | Freq: Two times a day (BID) | VAGINAL | 0 refills | Status: DC

## 2016-06-11 NOTE — Progress Notes (Signed)
Subjective:     [name], female   DOB: 1970/12/27, 46 y.o.   MRN: [id number]  HPI Yvette Mayer is a 46 year old black female in complaining of vaginal itching and irritation and discharge and cold symptoms, like cough and yellow nasal mucous and hoarse at times. Has taken OTC meds.without resolution.  Review of Systems +vaginal itching and irritation + vaginal discharge  +cough and nasal mucous and hoarse at times Reviewed past medical,surgical, social and family history. Reviewed medications and allergies.     Objective:   Physical Exam BP 100/62 (BP Location: Left Arm, Patient Position: Sitting, Cuff Size: Normal)   Pulse 72   Ht 5\' 5"  (1.651 m)   Wt 158 lb (71.7 kg)   BMI 26.29 kg/m   PHQ 2 score 0. Skin warm and dry. Neck: mid line trachea, normal thyroid, good ROM, no lymphadenopathy noted. Lungs: clear to ausculation bilaterally. Cardiovascular: regular rate and rhythm.Right ear red, throat clear, frontal sinus tender. .Pelvic: external genitalia is normal in appearance no lesions, vagina: white discharge without odor,urethra has no lesions or masses noted, cervix and uterus are absent, adnexa: no masses or tenderness noted. Bladder is non tender and no masses felt. Wet prep: + for clue cells and +WBCs.     Assessment:     1. Vaginal discharge   2. Vaginal itching   3. BV (bacterial vaginosis)   4. Subacute frontal sinusitis       Plan:     Rx metrogel use 1 applicator in vagina bid #70 gm, no refills Rx diflucan 150 mg #1 take 1 now and 1 refill Rx Z pack Rx tessalon perles 100 mg #30 take 1 tid prn cough, no refills Push fluids Try luvena and astro glide Follow up in May for physical

## 2016-06-11 NOTE — Telephone Encounter (Signed)
Patient called requesting a referral to [facility] in [place] for Reflux.  CB# [phone]

## 2016-06-15 ENCOUNTER — Ambulatory Visit: Payer: BLUE CROSS/BLUE SHIELD | Admitting: Family Medicine

## 2016-06-15 NOTE — Telephone Encounter (Signed)
Referral sent per pt request

## 2016-06-23 ENCOUNTER — Telehealth: Payer: Self-pay | Admitting: Family Medicine

## 2016-06-23 DIAGNOSIS — K219 Gastro-esophageal reflux disease without esophagitis: Secondary | ICD-10-CM

## 2016-06-23 NOTE — Telephone Encounter (Signed)
noted 

## 2016-06-23 NOTE — Telephone Encounter (Signed)
Pt had requested referral to GI in [facility] for reflux.  Referral was sent them.  They are calling today to say that office is closed.  Their provider has moved to [place].  I called [facility] back ( had been seen there before) and she has been dismissed by them for too many No Call/No Show appts.  Will submit to Dr Olevia Perches office and told pt.  Needs to keep appts and cancel ahead of time if can not make.

## 2016-06-30 ENCOUNTER — Ambulatory Visit: Payer: BLUE CROSS/BLUE SHIELD | Admitting: Family Medicine

## 2016-07-07 ENCOUNTER — Ambulatory Visit: Payer: BLUE CROSS/BLUE SHIELD | Admitting: Family Medicine

## 2016-07-08 NOTE — Telephone Encounter (Signed)
I spoke with the patient and made her aware that since she's had several "no shows" she will need to be referred by her pcp prior to [place] seeing her.  She stated she will have Dr. Buelah Manis send [place] a referral and she apologized for her "no shows".  She was also made aware that if she continues to no show for her appointment it may result in her being discharged from our practice.  She voiced understanding and disconnected the call.

## 2016-07-15 ENCOUNTER — Encounter: Payer: Self-pay | Admitting: Family Medicine

## 2016-07-15 ENCOUNTER — Encounter: Payer: Self-pay | Admitting: Gastroenterology

## 2016-07-15 ENCOUNTER — Ambulatory Visit (INDEPENDENT_AMBULATORY_CARE_PROVIDER_SITE_OTHER): Payer: BLUE CROSS/BLUE SHIELD | Admitting: Family Medicine

## 2016-07-15 VITALS — BP 106/70 | HR 76 | Temp 97.9°F | Resp 14 | Ht 65.0 in | Wt 149.0 lb

## 2016-07-15 DIAGNOSIS — K5900 Constipation, unspecified: Secondary | ICD-10-CM | POA: Diagnosis not present

## 2016-07-15 DIAGNOSIS — B9689 Other specified bacterial agents as the cause of diseases classified elsewhere: Secondary | ICD-10-CM | POA: Diagnosis not present

## 2016-07-15 DIAGNOSIS — K219 Gastro-esophageal reflux disease without esophagitis: Secondary | ICD-10-CM

## 2016-07-15 DIAGNOSIS — K449 Diaphragmatic hernia without obstruction or gangrene: Secondary | ICD-10-CM

## 2016-07-15 DIAGNOSIS — N760 Acute vaginitis: Secondary | ICD-10-CM

## 2016-07-15 DIAGNOSIS — N898 Other specified noninflammatory disorders of vagina: Secondary | ICD-10-CM | POA: Diagnosis not present

## 2016-07-15 LAB — WET PREP FOR TRICH, YEAST, CLUE
Trich, Wet Prep: NONE SEEN
YEAST WET PREP: NONE SEEN

## 2016-07-15 MED ORDER — PILOCARPINE HCL 5 MG PO TABS: 5.0000 mg | ORAL_TABLET | Freq: Two times a day (BID) | ORAL | 3 refills | Status: DC

## 2016-07-15 MED ORDER — LINACLOTIDE 145 MCG PO CAPS: 145.0000 ug | ORAL_CAPSULE | Freq: Every day | ORAL | 1 refills | Status: DC

## 2016-07-15 MED ORDER — METRONIDAZOLE 500 MG PO TABS: 500.0000 mg | ORAL_TABLET | Freq: Two times a day (BID) | ORAL | 0 refills | Status: DC

## 2016-07-15 MED ORDER — FLUCONAZOLE 150 MG PO TABS: 150.0000 mg | ORAL_TABLET | Freq: Once | ORAL | 0 refills | Status: AC

## 2016-07-15 NOTE — Assessment & Plan Note (Signed)
Chronic constipation also with IBS history Given Linzess Advised to stop the colonics Send to GI For her gerd , barretts follow up, may need EGD, she is on PPI

## 2016-07-15 NOTE — Progress Notes (Signed)
   Subjective:    [name], female    DOB: 1970/11/13, 46 y.o.   MRN: [id number]  Patient presents for Constipation (was seen in ER in Dec- states she has seen a colon specialist on 2/13 and was told she has parasites)      Has had difficulty with constipation for past few months, has been going upwards 5-6 days without bowel movement, had colonic a few times, which removed small amounts of stool, went to ER on 12/31 large amount of stool, using Enema's/suppository. She states the provider told her she had worms  In the past has seen Dr. Oneida Alar for bowel issues and indigestion issues. Has short segment of Barrett's due for Endoscopy as well.   She has tried changing her diet, eating more organic, also trying to increase water. Has used miralax and benefiber in the past, fleet enema    Has Sjorgen's automimmune disorder-     Has been going to [facility]- colon cleaning    Has had vaginal irritation, no discharge, used baby oil to help with dryness and this caused irritation. Just wants to be checked      Review Of Systems:  GEN- denies fatigue, fever, weight loss,weakness, recent illness HEENT- denies eye drainage, change in vision, nasal discharge, CVS- denies chest pain, palpitations RESP- denies SOB, cough, wheeze ABD- denies N/V, change in stools, abd pain GU- denies dysuria, hematuria, dribbling, incontinence MSK- denies joint pain, muscle aches, injury Neuro- denies headache, dizziness, syncope, seizure activity       Objective:    BP 106/70   Pulse 76   Temp 97.9 F (36.6 C) (Oral)   Resp 14   Ht 5\' 5"  (1.651 m)   Wt 149 lb (67.6 kg)   SpO2 98%   BMI 24.79 kg/m  GEN- NAD, alert and oriented x3 CVS- RRR, no murmur RESP-CTAB ABD-NABS,soft,NT, mild bloating GU- normal external genitalia, vaginal mucosa pink and moist, s/p hysterectomy . minimal thin clear discharge, no ovarian masses,         Assessment & Plan:      Problem List  Items Addressed This Visit    GERD (gastroesophageal reflux disease)   Relevant Medications   linaclotide (LINZESS) 145 MCG CAPS capsule   Other Relevant Orders   Ambulatory referral to Gastroenterology   Constipation - Primary    Chronic constipation also with IBS history Given Linzess Advised to stop the colonics Send to GI For her gerd , barretts follow up, may need EGD, she is on PPI          Relevant Orders   Ambulatory referral to Gastroenterology   BV (bacterial vaginosis)    Treat with flagyl      Relevant Medications   metroNIDAZOLE (FLAGYL) 500 MG tablet   fluconazole (DIFLUCAN) 150 MG tablet   Other Relevant Orders   WET PREP FOR [place], YEAST, CLUE (Completed)   GC/Chlamydia Probe Amp    Other Visit Diagnoses    Hiatal hernia       Relevant Orders   Ambulatory referral to Gastroenterology      Note: This dictation was prepared with Dragon dictation along with smaller phrase technology. Any transcriptional errors that result from this process are unintentional.

## 2016-07-15 NOTE — Patient Instructions (Signed)
F/u AS NEEDED Referral to GI Take antibiotics as prescribed  Linzess samples for constipation

## 2016-07-15 NOTE — Assessment & Plan Note (Signed)
Treat with flagyl. 

## 2016-07-16 ENCOUNTER — Ambulatory Visit (INDEPENDENT_AMBULATORY_CARE_PROVIDER_SITE_OTHER): Payer: BLUE CROSS/BLUE SHIELD | Admitting: Adult Health

## 2016-07-16 ENCOUNTER — Encounter: Payer: Self-pay | Admitting: Adult Health

## 2016-07-16 VITALS — BP 92/60 | HR 60 | Ht 64.0 in | Wt 149.0 lb

## 2016-07-16 DIAGNOSIS — N816 Rectocele: Secondary | ICD-10-CM | POA: Diagnosis not present

## 2016-07-16 DIAGNOSIS — K5900 Constipation, unspecified: Secondary | ICD-10-CM

## 2016-07-16 LAB — GC/CHLAMYDIA PROBE AMP
CT Probe RNA: NOT DETECTED
GC PROBE AMP APTIMA: NOT DETECTED

## 2016-07-16 NOTE — Progress Notes (Signed)
Subjective:     [name], female   DOB: July 26, 1970, 46 y.o.   MRN: [id number]  HPI Yvette Mayer is a 46 year old black female in complaining of constipation but saw Dr Buelah Manis yesterday and was started on linzess and has had 4 BMS today, but feels "like something hung up in butt". Has appt with Dr Oneida Alar 08/03/16 at 10 am, she says. PCP is Dr Buelah Manis.  Review of Systems +constipation Feels like "something hung up in butt" Reviewed past medical,surgical, social and family history. Reviewed medications and allergies.     Objective:   Physical Exam BP 92/60 (BP Location: Left Arm, Patient Position: Sitting, Cuff Size: Normal)   Pulse 60   Ht 5\' 4"  (1.626 m)   Wt 149 lb (67.6 kg)   BMI 25.58 kg/m   PHQ 2 score 0 On rectal exam, has good tone no hemorrhoids but has +rectocele, no stool in vault.   Explained rectocele to her with pictures.  Assessment:     Rectocele Constipation     Plan:     Given medical explainer #4 about rectocele and handout Keep appt with Dr Oneida Alar 08/03/16 at 10 am Take linzess per Dr Buelah Manis

## 2016-07-16 NOTE — Patient Instructions (Signed)
Pelvic Organ Prolapse Introduction Pelvic organ prolapse is the stretching, bulging, or dropping of pelvic organs into an abnormal position. It happens when the muscles and tissues that surround and support pelvic structures are stretched or weak. Pelvic organ prolapse can involve:  Vagina (vaginal prolapse).  Uterus (uterine prolapse).  Bladder (cystocele).  Rectum (rectocele).  Intestines (enterocele). When organs other than the vagina are involved, they often bulge into the vagina or protrude from the vagina, depending on how severe the prolapse is. What are the causes? Causes of this condition include:  Pregnancy, labor, and childbirth.  Long-lasting (chronic) cough.  Chronic constipation.  Obesity.  Past pelvic surgery.  Aging. During and after menopause, a decreased production of the hormone estrogen can weaken pelvic ligaments and muscles.  Consistently lifting more than 50 lb (23 kg).  Buildup of fluid in the abdomen due to certain diseases and other conditions. What are the signs or symptoms? Symptoms of this condition include:  Loss of bladder control when you cough, sneeze, strain, and exercise (stress incontinence). This may be worse immediately following childbirth, and it may gradually improve over time.  Feeling pressure in your pelvis or vagina. This pressure may increase when you cough or when you are having a bowel movement.  A bulge that protrudes from the opening of your vagina or against your vaginal wall. If your uterus protrudes through the opening of your vagina and rubs against your clothing, you may also experience soreness, ulcers, infection, pain, and bleeding.  Increased effort to have a bowel movement or urinate.  Pain in your low back.  Pain, discomfort, or disinterest in sexual intercourse.  Repeated bladder infections (urinary tract infections).  Difficulty inserting or inability to insert a tampon or applicator. In some people, this  condition does not cause any symptoms. How is this diagnosed? Your health care provider may perform an internal and external vaginal and rectal exam. During the exam, you may be asked to cough and strain while you are lying down, sitting, and standing up. Your health care provider will determine if other tests are required, such as bladder function tests. How is this treated? In most cases, this condition needs to be treated only if it produces symptoms. No treatment is guaranteed to correct the prolapse or relieve the symptoms completely. Treatment may include:  Lifestyle changes, such as:  Avoiding drinking beverages that contain caffeine.  Increasing your intake of high-fiber foods. This can help to decrease constipation and straining during bowel movements.  Emptying your bladder at scheduled times (bladder training therapy). This can help to reduce or avoid urinary incontinence.  Losing weight if you are overweight or obese.  Estrogen. Estrogen may help mild prolapse by increasing the strength and tone of pelvic floor muscles.  Kegel exercises. These may help mild cases of prolapse by strengthening and tightening the muscles of the pelvic floor.  Pessary insertion. A pessary is a soft, flexible device that is placed into your vagina by your health care provider to help support the vaginal walls and keep pelvic organs in place.  Surgery. This is often the only form of treatment for severe prolapse. Different types of surgeries are available. Follow these instructions at home:  Wear a sanitary pad or absorbent product if you have urinary incontinence.  Avoid heavy lifting and straining with exercise and work. Do not hold your breath when you perform mild to moderate lifting and exercise activities. Limit your activities as directed by your health care provider.  Take   medicines only as directed by your health care provider.  Perform Kegel exercises as directed by your health care  provider.  If you have a pessary, take care of it as directed by your health care provider. Contact a health care provider if:  Your symptoms interfere with your daily activities or sex life.  You need medicine to help with the discomfort.  You notice bleeding from the vagina that is not related to your period.  You have a fever.  You have pain or bleeding when you urinate.  You have bleeding when you have a bowel movement.  You lose urine when you have sex.  You have chronic constipation.  You have a pessary that falls out.  You have vaginal discharge that has a bad smell.  You have low abdominal pain or cramping that is unusual for you. This information is not intended to replace advice given to you by your health care provider. Make sure you discuss any questions you have with your health care provider. Document Released: 12/13/2013 Document Revised: 10/24/2015 Document Reviewed: 07/31/2013  2017 [employer]vier  

## 2016-07-20 ENCOUNTER — Telehealth: Payer: Self-pay | Admitting: *Deleted

## 2016-07-20 NOTE — Telephone Encounter (Signed)
Left message that she can come get another picture,medical explainer #4

## 2016-07-21 ENCOUNTER — Telehealth: Payer: Self-pay | Admitting: Adult Health

## 2016-07-21 NOTE — Telephone Encounter (Signed)
Left message that she needs appt to discuss hormones

## 2016-07-28 ENCOUNTER — Ambulatory Visit: Payer: BLUE CROSS/BLUE SHIELD | Admitting: Family Medicine

## 2016-08-03 ENCOUNTER — Encounter: Payer: Self-pay | Admitting: Gastroenterology

## 2016-08-03 ENCOUNTER — Other Ambulatory Visit: Payer: Self-pay

## 2016-08-03 ENCOUNTER — Ambulatory Visit (INDEPENDENT_AMBULATORY_CARE_PROVIDER_SITE_OTHER): Payer: BLUE CROSS/BLUE SHIELD | Admitting: Gastroenterology

## 2016-08-03 VITALS — BP 102/69 | HR 65 | Temp 97.8°F | Ht 64.0 in | Wt 151.6 lb

## 2016-08-03 DIAGNOSIS — R1310 Dysphagia, unspecified: Secondary | ICD-10-CM | POA: Diagnosis not present

## 2016-08-03 DIAGNOSIS — K219 Gastro-esophageal reflux disease without esophagitis: Secondary | ICD-10-CM | POA: Diagnosis not present

## 2016-08-03 DIAGNOSIS — K5900 Constipation, unspecified: Secondary | ICD-10-CM | POA: Diagnosis not present

## 2016-08-03 DIAGNOSIS — Z1211 Encounter for screening for malignant neoplasm of colon: Secondary | ICD-10-CM

## 2016-08-03 DIAGNOSIS — R1013 Epigastric pain: Secondary | ICD-10-CM

## 2016-08-03 MED ORDER — PEG 3350-KCL-NA BICARB-NACL 420 G PO SOLR: 4000.0000 mL | ORAL | 0 refills | Status: DC

## 2016-08-03 NOTE — Patient Instructions (Addendum)
1. Colonoscopy and upper endoscopy as scheduled. See separate instructions. STARTING FIVE DAYS BEFORE YOUR PROCEDURE, TAKE 2 OF THE LINZESS CAPSULES EVERY MORNING. WE HAVE PROVIDED YOU WITH SAMPLES FOR THIS TIME FRAME.  2. Continue Linzess 146mcg daily and pepcid daily for now.

## 2016-08-03 NOTE — Progress Notes (Signed)
Primary Care Physician:  Vic Blackbird, MD  Primary Gastroenterologist:  Garfield Cornea, MD   Chief Complaint  Patient presents with  . Constipation    HPI:  Yvette Mayer is a 46 y.o. female here At the request of PCP for further evaluation of constipation, GERD, hiatal hernia.  Patient had EGD 12/2010 by Dr. Gala Romney. Accentuated undulation Z-line noted, bx negative for Barrett's, small hiatal hernia. Esophagus dilated.   States she was diagnosed with Sjogren's 2013. She has dry mouth/dry eyes.   Complains of frequent heartburn, indigestion especially with meals. Pain in the epigastric region. Sensation of food not going down well, sticking in chest, but unsure if related to Sjogren's or missing teeth. Complains of constipation. Was going up to a week without BM. Tried multiple OTC agents without relief. Saw Colon therapist and had her colon cleaned out but felt like this made things worse. Recently started Linzess 110mcg daily. BM bristol 4 approximately 5 days per week. Eating more organic food. No melena, brbpr. Hb better if takes pepcid every day. Drinks a lot of water. Feels hoarse all the time. Previously was on Nexium but didn't like it. Stopped prilosec for unclear reasons.   Current Outpatient Prescriptions  Medication Sig Dispense Refill  . famotidine (PEPCID) 20 MG tablet Take 1 tablet (20 mg total) by mouth daily. 30 tablet 6  . linaclotide (LINZESS) 145 MCG CAPS capsule Take 1 capsule (145 mcg total) by mouth daily before breakfast. 90 capsule 1  . pilocarpine (SALAGEN) 5 MG tablet Take 1 tablet (5 mg total) by mouth 2 (two) times daily. 60 tablet 3  . valACYclovir (VALTREX) 1000 MG tablet TAKE ONE TABLET BY MOUTH TWICE DAILY (Patient taking differently: prn) 14 tablet 0   No current facility-administered medications for this visit.     Allergies as of 08/03/2016 - Review Complete 08/03/2016  Allergen Reaction Noted  . Hydrocodone-acetaminophen Itching 01/13/2011  .  Percocet [oxycodone-acetaminophen] Itching 01/13/2011  . Plaquenil [hydroxychloroquine sulfate] Rash 04/16/2015    Past Medical History:  Diagnosis Date  . Abdominal pain 06/12/2014  . Allergy   . Anemia    iron 1 yr ago, normal hgb 7/12  . Anxiety   . Asthma    pt says no-bronchitis  . Bloated abdomen 10/15/2015  . Chronic back pain   . Chronic headaches   . Chronic neck pain   . Chronic sinusitis   . Current use of estrogen therapy 04/16/2015  . Dyspareunia 09/12/2014  . Elevated TSH 04/16/2015  . Fatigue 04/10/2015  . Fibromyalgia   . Gas 10/15/2015  . GERD (gastroesophageal reflux disease)   . Herpes simplex virus (HSV) infection   . Hot flashes 04/10/2015  . Hyperlipidemia   . Kidney stone on right side 10/24/2015  . Mental disorder   . Ovarian cyst, right   . Paresthesias   . Shortness of breath   . Sinus drainage   . Sjoegren syndrome ([place])   . Symptoms, such as flushing, sleeplessness, headache, lack of concentration, associated with the menopause 04/10/2015  . TMJ (dislocation of temporomandibular joint)   . Vaginal discharge 08/08/2015  . Vertigo   . Yeast infection 10/15/2015    Past Surgical History:  Procedure Laterality Date  . ABDOMINAL HYSTERECTOMY    . CESAREAN SECTION     x2  . CHOLECYSTECTOMY  2005   cholelithiasis  . ESOPHAGOGASTRODUODENOSCOPY  01/23/2011   [id number] undulating Z-line vs short segment Barrett s/p bx (NOT BARRETT's)/small HH otherwise normal  .  MALONEY DILATION  01/23/2011   Procedure: Venia Minks DILATION;  Surgeon: Daneil Dolin, MD;  [facility];  Service: Endoscopy;  Laterality: N/A;  . OVARIAN CYST REMOVAL  92 removal of cysts from behind ovaries  . PARTIAL HYSTERECTOMY  93  . SEPTOPLASTY     2011  . TEMPOROMANDIBULAR JOINT SURGERY    . TONSILLECTOMY     2010  . TURBINATE RESECTION Bilateral 05/29/2013   Procedure: TURBINATE RESECTION;  Surgeon: Ascencion Dike, MD;  Location: [facility];  Service: ENT;   Laterality: Bilateral;  . UPPER GASTROINTESTINAL ENDOSCOPY      Family History  Problem Relation Age of Onset  . Hypertension Mother   . Thyroid disease Sister     overactive  . Bronchitis Son   . Heart disease Maternal Grandmother   . Hypertension Maternal Grandmother   . Alcohol abuse Maternal Grandfather   . Scoliosis Son   . Other Maternal Aunt     Sjeogren syndrome  . Other Maternal Aunt     Sjoegren syndrome  . Other Maternal Aunt     Sjoegren syndrome  . Anesthesia problems Neg Hx   . Malignant hyperthermia Neg Hx   . Hypotension Neg Hx   . Pseudochol deficiency Neg Hx     Social History   Social History  . Marital status: Legally Separated    Spouse name: N/A  . Number of children: 3  . Years of education: N/A   Occupational History  . [employer]   Social History Main Topics  . Smoking status: Never Smoker  . Smokeless tobacco: Never Used  . Alcohol use No  . Drug use: No  . Sexual activity: Yes    Birth control/ protection: Surgical     Comment: hyst   Other Topics Concern  . Not on file   Social History Narrative  . No narrative on file      ROS:  General: Negative for anorexia, weight loss, fever, chills, fatigue, weakness. Eyes: Negative for vision changes.  ENT: Negative for   nasal congestion. See hpi CV: Negative for chest pain, angina, palpitations, dyspnea on exertion, peripheral edema.  Respiratory: Negative for dyspnea at rest, dyspnea on exertion, cough, sputum, wheezing.  GI: See history of present illness. GU:  Negative for dysuria, hematuria, urinary incontinence, urinary frequency, nocturnal urination.  MS: Negative for joint pain, low back pain.  Derm: Negative for rash or itching.  Neuro: Negative for weakness, abnormal sensation, seizure, frequent headaches, memory loss, confusion.  Psych: Negative for anxiety, depression, suicidal ideation, hallucinations.  Endo: Negative for unusual weight change.  Heme:  Negative for bruising or bleeding. Allergy: Negative for rash or hives.    Physical Examination:  BP 102/69   Pulse 65   Temp 97.8 F (36.6 C) (Oral)   Ht 5\' 4"  (1.626 m)   Wt 151 lb 9.6 oz (68.8 kg)   BMI 26.02 kg/m    General: Well-nourished, well-developed in no acute distress.  Head: Normocephalic, atraumatic.   Eyes: Conjunctiva pink, no icterus. Mouth: Oropharyngeal mucosa moist and pink , no lesions erythema or exudate. Neck: Supple without thyromegaly, masses, or lymphadenopathy.  Lungs: Clear to auscultation bilaterally.  Heart: Regular rate and rhythm, no murmurs rubs or gallops.  Abdomen: Bowel sounds are normal, nontender, nondistended, no hepatosplenomegaly or masses, no abdominal bruits or    hernia , no rebound or guarding.   Rectal: deferred Extremities: No lower extremity edema. No clubbing  or deformities.  Neuro: Alert and oriented x 4 , grossly normal neurologically.  Skin: Warm and dry, no rash or jaundice.   Psych: Alert and cooperative, normal mood and affect.

## 2016-08-03 NOTE — Progress Notes (Signed)
cc'ed to pcp °

## 2016-08-03 NOTE — Assessment & Plan Note (Signed)
Doing much better on Linzess 141mcg daily. Continue current dose but we did discuss potentially increasing dose if she loses efficacy.  She has never had colonoscopy, based on ethnicity she should have her first ever screening colonoscopy at age 46. Offer her screening colonoscopy in the near future.  I have discussed the risks, alternatives, benefits with regards to but not limited to the risk of reaction to medication, bleeding, infection, perforation and the patient is agreeable to proceed. Written consent to be obtained.

## 2016-08-03 NOTE — Assessment & Plan Note (Signed)
46 year old female with history of Sjogren's syndrome who presents with swallowing difficulty, predominantly to solid foods. She has chronic GERD, typically managed with Pepcid daily. Offered her EGD with dilation in the near future to rule out stricture/complicated GERD.  Cannot rule out underlying motility disorder. I have discussed the risks, alternatives, benefits with regards to but not limited to the risk of reaction to medication, bleeding, infection, perforation and the patient is agreeable to proceed. Written consent to be obtained.

## 2016-08-04 ENCOUNTER — Ambulatory Visit: Payer: BLUE CROSS/BLUE SHIELD | Admitting: Adult Health
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
## 2016-08-13 ENCOUNTER — Ambulatory Visit (INDEPENDENT_AMBULATORY_CARE_PROVIDER_SITE_OTHER): Payer: BLUE CROSS/BLUE SHIELD | Admitting: Rheumatology

## 2016-08-13 ENCOUNTER — Encounter: Payer: Self-pay | Admitting: Rheumatology

## 2016-08-13 VITALS — BP 107/69 | HR 57 | Resp 12 | Ht 64.0 in | Wt 149.0 lb

## 2016-08-13 DIAGNOSIS — Z87898 Personal history of other specified conditions: Secondary | ICD-10-CM | POA: Diagnosis not present

## 2016-08-13 DIAGNOSIS — M3509 Sicca syndrome with other organ involvement: Secondary | ICD-10-CM | POA: Diagnosis not present

## 2016-08-13 DIAGNOSIS — G4711 Idiopathic hypersomnia with long sleep time: Secondary | ICD-10-CM

## 2016-08-13 DIAGNOSIS — Z79899 Other long term (current) drug therapy: Secondary | ICD-10-CM

## 2016-08-13 DIAGNOSIS — Z8719 Personal history of other diseases of the digestive system: Secondary | ICD-10-CM

## 2016-08-13 DIAGNOSIS — Z8639 Personal history of other endocrine, nutritional and metabolic disease: Secondary | ICD-10-CM

## 2016-08-13 LAB — CBC WITH DIFFERENTIAL/PLATELET
BASOS PCT: 2 %
Basophils Absolute: 60 cells/uL (ref 0–200)
EOS ABS: 30 {cells}/uL (ref 15–500)
Eosinophils Relative: 1 %
HCT: 44.2 % (ref 35.0–45.0)
Hemoglobin: 14.4 g/dL (ref 11.7–15.5)
LYMPHS PCT: 56 %
Lymphs Abs: 1680 cells/uL (ref 850–3900)
MCH: 30 pg (ref 27.0–33.0)
MCHC: 32.6 g/dL (ref 32.0–36.0)
MCV: 92.1 fL (ref 80.0–100.0)
MONOS PCT: 9 %
MPV: 10.3 fL (ref 7.5–12.5)
Monocytes Absolute: 270 cells/uL (ref 200–950)
Neutro Abs: 960 cells/uL — ABNORMAL LOW (ref 1500–7800)
Neutrophils Relative %: 32 %
PLATELETS: 338 10*3/uL (ref 140–400)
RBC: 4.8 MIL/uL (ref 3.80–5.10)
RDW: 13.1 % (ref 11.0–15.0)
WBC: 3 10*3/uL — AB (ref 3.8–10.8)

## 2016-08-13 LAB — COMPLETE METABOLIC PANEL WITH GFR
ALT: 13 U/L (ref 6–29)
AST: 20 U/L (ref 10–35)
Albumin: 4.3 g/dL (ref 3.6–5.1)
Alkaline Phosphatase: 74 U/L (ref 33–115)
BUN: 10 mg/dL (ref 7–25)
CHLORIDE: 105 mmol/L (ref 98–110)
CO2: 26 mmol/L (ref 20–31)
CREATININE: 0.82 mg/dL (ref 0.50–1.10)
Calcium: 10.1 mg/dL (ref 8.6–10.2)
GFR, EST NON AFRICAN AMERICAN: 86 mL/min (ref >=60)
Glucose, Bld: 86 mg/dL (ref 65–99)
POTASSIUM: 5.2 mmol/L (ref 3.5–5.3)
Sodium: 140 mmol/L (ref 135–146)
Total Bilirubin: 0.5 mg/dL (ref 0.2–1.2)
Total Protein: 7.7 g/dL (ref 6.1–8.1)

## 2016-08-13 MED ORDER — AZATHIOPRINE 50 MG PO TABS: 50.0000 mg | ORAL_TABLET | Freq: Every day | ORAL | 1 refills | Status: DC

## 2016-08-13 MED ORDER — PILOCARPINE HCL 5 MG PO TABS: 5.0000 mg | ORAL_TABLET | Freq: Two times a day (BID) | ORAL | 3 refills | Status: DC

## 2016-08-13 NOTE — Patient Instructions (Addendum)
Standing Labs We placed an order today for your standing lab work.    Please come back and get your standing labs in 2 weeks then in 2 months.    We have open lab Monday through Friday from 8:30-11:30 AM and 1:30-4 PM at the office of Dr. Tresa Moore, PA.   The office is located at [address], [place], [place], [place] [address] No appointment is necessary.   Labs are drawn by [facility].  You may receive a bill from [facility] for your lab work.     Azathioprine tablets What is this medicine? AZATHIOPRINE (ay za THYE oh preen) suppresses the immune system. It is used to prevent organ rejection after a transplant. It is also used to treat rheumatoid arthritis. This medicine may be used for other purposes; ask your health care provider or pharmacist if you have questions. COMMON BRAND NAME(S): Azasan, Imuran What should I tell my health care provider before I take this medicine? They need to know if you have any of these conditions: -infection -kidney disease -liver disease -an unusual or allergic reaction to azathioprine, other medicines, lactose, foods, dyes, or preservatives -pregnant or trying to get pregnant -breast feeding How should I use this medicine? Take this medicine by mouth with a full glass of water. Follow the directions on the prescription label. Take your medicine at regular intervals. Do not take your medicine more often than directed. Continue to take your medicine even if you feel better. Do not stop taking except on your doctor's advice. Talk to your pediatrician regarding the use of this medicine in children. Special care may be needed. Overdosage: If you think you have taken too much of this medicine contact a poison control center or emergency room at once. NOTE: This medicine is only for you. Do not share this medicine with others. What if I miss a dose? If you miss a dose, take it as soon as you can. If it is almost time for your next dose,  take only that dose. Do not take double or extra doses. What may interact with this medicine? Do not take this medicine with any of the following medications: -febuxostat -mercaptopurine This medicine may also interact with the following medications: -allopurinol -aminosalicylates like sulfasalazine, mesalamine, balsalazide, and olsalazine -leflunomide -medicines called ACE inhibitors like benazepril, captopril, enalapril, fosinopril, quinapril, lisinopril, ramipril, and trandolapril -mycophenolate -sulfamethoxazole; trimethoprim -vaccines -warfarin This list may not describe all possible interactions. Give your health care provider a list of all the medicines, herbs, non-prescription drugs, or dietary supplements you use. Also tell them if you smoke, drink alcohol, or use illegal drugs. Some items may interact with your medicine. What should I watch for while using this medicine? Visit your doctor or health care professional for regular checks on your progress. You will need frequent blood checks during the first few months you are receiving the medicine. If you get a cold or other infection while receiving this medicine, call your doctor or health care professional. Do not treat yourself. The medicine may increase your risk of getting an infection. Women should inform their doctor if they wish to become pregnant or think they might be pregnant. There is a potential for serious side effects to an unborn child. Talk to your health care professional or pharmacist for more information. Men may have a reduced sperm count while they are taking this medicine. Talk to your health care professional for more information. This medicine may increase your risk of getting certain  kinds of cancer. Talk to your doctor about healthy lifestyle choices, important screenings, and your risk. What side effects may I notice from receiving this medicine? Side effects that you should report to your doctor or health  care professional as soon as possible: -allergic reactions like skin rash, itching or hives, swelling of the face, lips, or tongue -changes in vision -confusion -fever, chills, or any other sign of infection -loss of balance or coordination -severe stomach pain -unusual bleeding, bruising -unusually weak or tired -vomiting -yellowing of the eyes or skin Side effects that usually do not require medical attention (report to your doctor or health care professional if they continue or are bothersome): -hair loss -nausea This list may not describe all possible side effects. Call your doctor for medical advice about side effects. You may report side effects to FDA at 1-800-FDA-1088. Where should I keep my medicine? Keep out of the reach of children. Store at room temperature between 15 and 25 degrees C (59 and 77 degrees F). Protect from light. Throw away any unused medicine after the expiration date. NOTE: This sheet is a summary. It may not cover all possible information. If you have questions about this medicine, talk to your doctor, pharmacist, or health care provider.  2018 Elsevier/Gold Standard (2013-09-12 12:00:31)

## 2016-08-13 NOTE — Progress Notes (Signed)
Pharmacy Note  Subjective: Patient presents today to the [facility] to see Mr. Panwala/Dr. Estanislado Pandy.  Patient seen by the pharmacist for counseling on azathioprine (Imuran).    Objective: CMP     Component Value Date/Time   NA 141 10/15/2015 0933   K 4.3 10/15/2015 0933   CL 100 10/15/2015 0933   CO2 25 10/15/2015 0933   GLUCOSE 86 10/15/2015 0933   GLUCOSE 78 04/03/2015 1938   BUN 13 10/15/2015 0933   CREATININE 0.69 10/15/2015 0933   CREATININE 0.72 08/09/2013 1016   CALCIUM 9.5 10/15/2015 0933   PROT 7.8 10/15/2015 0933   ALBUMIN 4.3 10/15/2015 0933   AST 22 10/15/2015 0933   ALT 14 10/15/2015 0933   ALKPHOS 86 10/15/2015 0933   BILITOT 0.2 10/15/2015 0933   GFRNONAA 105 10/15/2015 0933   GFRAA 122 10/15/2015 0933   CBC    Component Value Date/Time   WBC 3.8 10/15/2015 0933   WBC 4.1 04/03/2015 1938   RBC 4.14 10/15/2015 0933   RBC 4.02 04/03/2015 1938   HGB 12.7 04/03/2015 1938   HCT 38.6 10/15/2015 0933   PLT 332 10/15/2015 0933   MCV 93 10/15/2015 0933   MCH 31.2 10/15/2015 0933   MCH 31.6 04/03/2015 1938   MCHC 33.4 10/15/2015 0933   MCHC 34.1 04/03/2015 1938   RDW 13.5 10/15/2015 0933   LYMPHSABS 2.4 04/03/2015 1938   MONOABS 0.3 04/03/2015 1938   EOSABS 0.1 04/03/2015 1938   BASOSABS 0.1 04/03/2015 1938   TPMT: normal (05/01/2015)  Assessment/Plan: Patient was counseled on the purpose, proper use, and adverse effects of azathioprine including risk of infection, nausea, rash, and hair loss.  Reviewed risk of cancer after long term use.  Discussed risk of myelosupression and reviewed importance of frequent lab work to monitor blood counts.  Provided patient with educational materials on azathioprine and answered all questions.  Patient consented to azathioprine.  Will upload consent into the media tab.    Elisabeth Most, Pharm.D., BCPS Clinical Pharmacist Pager: [phone] Phone: [phone] 08/13/2016 10:12 AM

## 2016-08-17 ENCOUNTER — Other Ambulatory Visit: Payer: Self-pay | Admitting: Family Medicine

## 2016-08-17 ENCOUNTER — Emergency Department (HOSPITAL_COMMUNITY): Payer: BLUE CROSS/BLUE SHIELD

## 2016-08-17 ENCOUNTER — Emergency Department (HOSPITAL_COMMUNITY)
Admission: EM | Admit: 2016-08-17 | Discharge: 2016-08-17 | Disposition: A | Discharge status: Home / Self Care | Payer: BLUE CROSS/BLUE SHIELD | Attending: Emergency Medicine | Admitting: Emergency Medicine

## 2016-08-17 ENCOUNTER — Telehealth: Payer: Self-pay | Admitting: Adult Health

## 2016-08-17 ENCOUNTER — Telehealth: Payer: Self-pay | Admitting: Family Medicine

## 2016-08-17 ENCOUNTER — Encounter (HOSPITAL_COMMUNITY): Payer: Self-pay | Admitting: Emergency Medicine

## 2016-08-17 DIAGNOSIS — M542 Cervicalgia: Secondary | ICD-10-CM | POA: Insufficient documentation

## 2016-08-17 DIAGNOSIS — J45909 Unspecified asthma, uncomplicated: Secondary | ICD-10-CM | POA: Diagnosis not present

## 2016-08-17 DIAGNOSIS — R55 Syncope and collapse: Secondary | ICD-10-CM | POA: Diagnosis not present

## 2016-08-17 DIAGNOSIS — N390 Urinary tract infection, site not specified: Secondary | ICD-10-CM | POA: Diagnosis not present

## 2016-08-17 DIAGNOSIS — R319 Hematuria, unspecified: Secondary | ICD-10-CM | POA: Diagnosis not present

## 2016-08-17 DIAGNOSIS — Z79899 Other long term (current) drug therapy: Secondary | ICD-10-CM | POA: Insufficient documentation

## 2016-08-17 DIAGNOSIS — R51 Headache: Secondary | ICD-10-CM | POA: Diagnosis present

## 2016-08-17 DIAGNOSIS — R42 Dizziness and giddiness: Secondary | ICD-10-CM | POA: Diagnosis not present

## 2016-08-17 DIAGNOSIS — S199XXA Unspecified injury of neck, initial encounter: Secondary | ICD-10-CM | POA: Diagnosis not present

## 2016-08-17 DIAGNOSIS — R404 Transient alteration of awareness: Secondary | ICD-10-CM | POA: Diagnosis not present

## 2016-08-17 DIAGNOSIS — S0003XA Contusion of scalp, initial encounter: Secondary | ICD-10-CM | POA: Diagnosis not present

## 2016-08-17 LAB — BASIC METABOLIC PANEL
Anion gap: 7 (ref 5–15)
BUN: 17 mg/dL (ref 6–20)
CO2: 25 mmol/L (ref 22–32)
CREATININE: 1.01 mg/dL — AB (ref 0.44–1.00)
Calcium: 9.5 mg/dL (ref 8.9–10.3)
Chloride: 105 mmol/L (ref 101–111)
GFR calc Af Amer: >60 mL/min (ref >60)
GLUCOSE: 91 mg/dL (ref 65–99)
POTASSIUM: 4.4 mmol/L (ref 3.5–5.1)
SODIUM: 137 mmol/L (ref 135–145)

## 2016-08-17 LAB — CBC WITH DIFFERENTIAL/PLATELET
Basophils Absolute: 0.1 10*3/uL (ref 0.0–0.1)
Basophils Relative: 3 %
EOS ABS: 0.1 10*3/uL (ref 0.0–0.7)
EOS PCT: 3 %
HCT: 39.7 % (ref 36.0–46.0)
Hemoglobin: 13.8 g/dL (ref 12.0–15.0)
LYMPHS ABS: 1.5 10*3/uL (ref 0.7–4.0)
LYMPHS PCT: 52 %
MCH: 31.4 pg (ref 26.0–34.0)
MCHC: 34.8 g/dL (ref 30.0–36.0)
MCV: 90.2 fL (ref 78.0–100.0)
MONO ABS: 0.2 10*3/uL (ref 0.1–1.0)
MONOS PCT: 6 %
Neutro Abs: 1.1 10*3/uL — ABNORMAL LOW (ref 1.7–7.7)
Neutrophils Relative %: 36 %
PLATELETS: 265 10*3/uL (ref 150–400)
RBC: 4.4 MIL/uL (ref 3.87–5.11)
RDW: 12.3 % (ref 11.5–15.5)
WBC: 3 10*3/uL — AB (ref 4.0–10.5)

## 2016-08-17 LAB — URINALYSIS, MICROSCOPIC (REFLEX)

## 2016-08-17 LAB — URINALYSIS, ROUTINE W REFLEX MICROSCOPIC
Bilirubin Urine: NEGATIVE
GLUCOSE, UA: NEGATIVE mg/dL
Nitrite: NEGATIVE
PROTEIN: NEGATIVE mg/dL
Specific Gravity, Urine: >1.03 — ABNORMAL HIGH (ref 1.005–1.030)
pH: 5.5 (ref 5.0–8.0)

## 2016-08-17 LAB — HEPATIC FUNCTION PANEL
ALT: 16 U/L (ref 14–54)
AST: 23 U/L (ref 15–41)
Albumin: 4.1 g/dL (ref 3.5–5.0)
Alkaline Phosphatase: 68 U/L (ref 38–126)
TOTAL PROTEIN: 7.8 g/dL (ref 6.5–8.1)
Total Bilirubin: 0.3 mg/dL (ref 0.3–1.2)

## 2016-08-17 MED ORDER — SODIUM CHLORIDE 0.9 % IV BOLUS (SEPSIS)
1000.0000 mL | Freq: Once | INTRAVENOUS | Status: AC
  Administered 2016-08-17: 1000 mL via INTRAVENOUS

## 2016-08-17 MED ORDER — CEPHALEXIN 500 MG PO CAPS: 500.0000 mg | ORAL_CAPSULE | Freq: Four times a day (QID) | ORAL | 0 refills | Status: DC
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
## 2016-08-17 NOTE — ED Triage Notes (Signed)
Pt reports feeling dizzy while at work and then having a syncopal episode, falling and striking head on the ground.

## 2016-08-17 NOTE — ED Notes (Signed)
Pt returned from ct

## 2016-08-17 NOTE — Addendum Note (Signed)
Addended by: Sheral Flow on: 08/17/2016 02:35 PM   Modules accepted: Level of Service, SmartSet

## 2016-08-17 NOTE — Discharge Instructions (Signed)
Follow-up with your doctor in 3-4 days for recheck. Make sure you eat something in the morning when you get up. Try to get 8 hours of sleep. Try to eat 3 meals a day. Drink plenty of fluids.

## 2016-08-17 NOTE — Telephone Encounter (Signed)
This encounter was created in error - please disregard.

## 2016-08-17 NOTE — Telephone Encounter (Signed)
Closing encounter

## 2016-08-18 ENCOUNTER — Telehealth: Payer: Self-pay | Admitting: Adult Health

## 2016-08-18 LAB — URINE CULTURE: Culture: <10000 — AB

## 2016-08-18 NOTE — Telephone Encounter (Signed)
Pt called stating that she would like for Yvette Mayer to write an order for labs, pt states that she has passed out at work and they states that she was dehydrated and a level of something was low. Please contact pt

## 2016-08-18 NOTE — Telephone Encounter (Signed)
Patient called stating she passed out at work and was found to be dehydrated. She was wanting to know if she needed her labs redrawn sooner, states she is to have labs redone in 4 weeks. Advised patient to follow the orders from the ordering provider and to push fluids to help her hydration. Patient states she has been under a lot of stress lately. Advised patient to follow-up with her PCP.

## 2016-08-24 ENCOUNTER — Ambulatory Visit (INDEPENDENT_AMBULATORY_CARE_PROVIDER_SITE_OTHER): Payer: BLUE CROSS/BLUE SHIELD | Admitting: Family Medicine

## 2016-08-24 ENCOUNTER — Encounter: Payer: Self-pay | Admitting: Family Medicine

## 2016-08-24 VITALS — BP 106/72 | HR 80 | Temp 97.9°F | Resp 14 | Ht 64.0 in | Wt 150.0 lb

## 2016-08-24 DIAGNOSIS — M3509 Sicca syndrome with other organ involvement: Secondary | ICD-10-CM

## 2016-08-24 DIAGNOSIS — M797 Fibromyalgia: Secondary | ICD-10-CM | POA: Diagnosis not present

## 2016-08-24 DIAGNOSIS — N390 Urinary tract infection, site not specified: Secondary | ICD-10-CM | POA: Diagnosis not present

## 2016-08-24 DIAGNOSIS — F4323 Adjustment disorder with mixed anxiety and depressed mood: Secondary | ICD-10-CM | POA: Diagnosis not present

## 2016-08-24 MED ORDER — DULOXETINE HCL 30 MG PO CPEP: 30.0000 mg | ORAL_CAPSULE | Freq: Every day | ORAL | 3 refills | Status: DC

## 2016-08-24 NOTE — Patient Instructions (Signed)
F/U 6 weeks Complete antibiotics Try the cymbalta Take the Imuran in the morning for your Sjogren's

## 2016-08-24 NOTE — Assessment & Plan Note (Signed)
I believe her stress is at Center of most of her complaints. She is not sleeping well this is causing worsening fibromyalgia. She is separating from her husband planning to move out of the house this week. She also is in divorce proceedings. We'll start her on Cymbalta 30 mg once a day thinking that this will help with that the fibromyalgia and the depression and anxiety and sleep. We'll follow her up in 6 weeks

## 2016-08-24 NOTE — Assessment & Plan Note (Signed)
Recommend that she go ahead and take the medication prescribed by rheumatology. At this time she has nonspecific low white blood cells. She has normal thyroid function and renal function liver function. This can be rechecked by rheumatology as they directed at the end of April

## 2016-08-24 NOTE — Progress Notes (Signed)
Subjective:    [name], female    DOB: March 10, 1971, 46 y.o.   MRN: [id number]  Patient presents for Hormone Check (would like estrogen levels to be checked) and ED F/U (s/p fall- dx: dehydration)   The patient was seen at the emergency room on March 19 for syncopal event with urinary tract infection her culture did come back with insignificant growth but she was given Keflex prior to culture result.  She is STILL TAKNG this - prescribed 4 a day but has not been taking consistently still has 3 days left  There is concern that she had not eaten anything before she became weak  She was also seen by her rheumatology a few days before the episode the emergency room on March 15 she had labs done which showed mildly low white blood cell count 3.0 with a few atypical lymphocytes they recommend that she have her repeat it in 4 weeks. SHe complains of fatigue, feeling like her body is "dry", she was given Imuran but has only taken 1 dos eof the medication, worried about the side effects    Stress at home is has significant stress she states for the past 5 years. She is actually left her husband before and came back he promises to counseling but none there marital issues worse all. This time she is sleeping for good. Shortly has an apartment and she is planning to move out this week he is aware that she is planning to leave but does not know will be the same. She is also in the middle of divorce proceedings. She states she has not slept for the past 2 weeks has bags under her eyes. She is stressed all the time which makes her body hurt from her fibromyalgia. She is having more problems with her autoimmune disorder per above. She is in agreement with trying something to help take the edge off. States she lays awake at night with her mind racing.  She's also requesting her estrogen levels be checked??? Advised this will be deferred to GYN but I do not think that this is needed at this  time.  Review Of Systems:  GEN- denies fatigue, fever, weight loss,weakness, recent illness HEENT- denies eye drainage, change in vision, nasal discharge, CVS- denies chest pain, palpitations RESP- denies SOB, cough, wheeze ABD- denies N/V, change in stools, abd pain GU- denies dysuria, hematuria, dribbling, incontinence MSK- denies joint pain, muscle aches, injury Neuro- denies headache, dizziness, syncope, seizure activity       Objective:    BP 106/72   Pulse 80   Temp 97.9 F (36.6 C) (Oral)   Resp 14   Ht 5\' 4"  (1.626 m)   Wt 150 lb (68 kg)   SpO2 99%   BMI 25.75 kg/m  GEN- NAD, alert and oriented x3 HEENT- PERRL, EOMI, non injected sclera, pink conjunctiva, MMM, oropharynx clear, has bags under her eyes  Neck- Supple, no thyromegaly, no bruit  CVS- RRR, no murmur RESP-CTAB ABD-NABS,soft,NT,ND, no CVA tenderness Psych- stressed appearing, fatigued, not depressed, no hallucnations  Pulses- Radial, DP- 2+        Assessment & Plan:      Problem List Items Addressed This Visit    Sjoegren syndrome ([place])    Recommend that she go ahead and take the medication prescribed by rheumatology. At this time she has nonspecific low white blood cells. She has normal thyroid function and renal function liver function. This can be rechecked by  rheumatology as they directed at the end of April      Situational mixed anxiety and depressive disorder   Fibromyalgia    I believe her stress is at Center of most of her complaints. She is not sleeping well this is causing worsening fibromyalgia. She is separating from her husband planning to move out of the house this week. She also is in divorce proceedings. We'll start her on Cymbalta 30 mg once a day thinking that this will help with that the fibromyalgia and the depression and anxiety and sleep. We'll follow her up in 6 weeks       Other Visit Diagnoses    Urinary tract infection without hematuria, site unspecified    -   Primary   unclear if true infection, not really symptomatic, she is 4 days into therapyl have her complete, repeating culture still has 3+ leukocytes   Relevant Orders   Urine culture      Note: This dictation was prepared with Dragon dictation along with smaller phrase technology. Any transcriptional errors that result from this process are unintentional.

## 2016-08-26 ENCOUNTER — Ambulatory Visit: Payer: BLUE CROSS/BLUE SHIELD | Admitting: Adult Health

## 2016-08-26 LAB — URINE CULTURE: Organism ID, Bacteria: NO GROWTH

## 2016-09-03 ENCOUNTER — Encounter: Payer: Self-pay | Admitting: Family Medicine

## 2016-09-04 ENCOUNTER — Ambulatory Visit: Payer: BLUE CROSS/BLUE SHIELD | Admitting: Adult Health

## 2016-09-04 ENCOUNTER — Telehealth: Payer: Self-pay | Admitting: Family Medicine

## 2016-09-04 MED ORDER — CYCLOBENZAPRINE HCL 5 MG PO TABS
5.0000 mg | ORAL_TABLET | Freq: Three times a day (TID) | ORAL | 0 refills | Status: DC | PRN
Start: 2016-09-04 — End: 2016-12-21

## 2016-09-04 NOTE — Telephone Encounter (Signed)
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

## 2016-09-04 NOTE — Telephone Encounter (Signed)
Rx called into pharmacy and pt made aware

## 2016-09-04 NOTE — Telephone Encounter (Signed)
Pt wants flexeril called in because she is having pain

## 2016-09-08 ENCOUNTER — Encounter: Payer: Self-pay | Admitting: Adult Health

## 2016-09-08 ENCOUNTER — Ambulatory Visit (INDEPENDENT_AMBULATORY_CARE_PROVIDER_SITE_OTHER): Payer: BLUE CROSS/BLUE SHIELD | Admitting: Adult Health

## 2016-09-08 VITALS — BP 92/54 | HR 60 | Ht 64.0 in | Wt 147.0 lb

## 2016-09-08 DIAGNOSIS — Z131 Encounter for screening for diabetes mellitus: Secondary | ICD-10-CM | POA: Diagnosis not present

## 2016-09-08 DIAGNOSIS — E789 Disorder of lipoprotein metabolism, unspecified: Secondary | ICD-10-CM | POA: Diagnosis not present

## 2016-09-08 DIAGNOSIS — Z87898 Personal history of other specified conditions: Secondary | ICD-10-CM

## 2016-09-08 DIAGNOSIS — Z8639 Personal history of other endocrine, nutritional and metabolic disease: Secondary | ICD-10-CM | POA: Diagnosis not present

## 2016-09-08 DIAGNOSIS — R232 Flushing: Secondary | ICD-10-CM | POA: Diagnosis not present

## 2016-09-08 DIAGNOSIS — Z862 Personal history of diseases of the blood and blood-forming organs and certain disorders involving the immune mechanism: Secondary | ICD-10-CM | POA: Diagnosis not present

## 2016-09-08 DIAGNOSIS — R5383 Other fatigue: Secondary | ICD-10-CM

## 2016-09-08 NOTE — Progress Notes (Signed)
Subjective:     [name], female   DOB: 1971-04-02, 46 y.o.   MRN: [id number]  HPI Yvette Mayer is a 46 year old black female in requesting labs.She has numerous complaints of fatigue, body aches, bruising easily, hot flashes and skin dry, everything dry. She fainted at work 2 weeks ago, went to ER and  was dehydrated. She says she is having EGD and colonoscopy in am, with Dr Gala Romney. PCP is Dr Buelah Manis.  Review of Systems Fatigue Body aches Bruising easily Hot flashes Skin dry (everything dry)  Reviewed past medical,surgical, social and family history. Reviewed medications and allergies.     Objective:   Physical Exam BP (!) 92/54 (BP Location: Left Arm, Patient Position: Sitting, Cuff Size: Normal)   Pulse 60   Ht 5\' 4"  (1.626 m)   Wt 147 lb (66.7 kg)   BMI 25.23 kg/m  Skin warm and dry. Neck: mid line trachea, normal thyroid, good ROM, no lymphadenopathy noted. Lungs: clear to ausculation bilaterally. Cardiovascular: regular rate and rhythm.Has about 5 bruises right leg, no known injury.  Will get labs today, and will discuss in 24-48 hours.   Assessment:     1. Fatigue, unspecified type   2. History of vitamin D deficiency   3. History of bruising easily   4. Borderline high cholesterol   5. Screening for diabetes mellitus   6. Hot flashes       Plan:     Check CBC,CMP,TSH and lipids,FSH, A1c and vitamin D Return in May for physical

## 2016-09-09 ENCOUNTER — Ambulatory Visit (HOSPITAL_COMMUNITY)
Admission: RE | Admit: 2016-09-09 | Discharge: 2016-09-09 | Disposition: A | Discharge status: Home / Self Care | Payer: BLUE CROSS/BLUE SHIELD | Source: Ambulatory Visit | Attending: Internal Medicine | Admitting: Internal Medicine

## 2016-09-09 ENCOUNTER — Encounter (HOSPITAL_COMMUNITY)
Admission: RE | Disposition: A | Discharge status: Home / Self Care | Payer: Self-pay | Source: Ambulatory Visit | Attending: Internal Medicine

## 2016-09-09 ENCOUNTER — Telehealth: Payer: Self-pay | Admitting: Adult Health

## 2016-09-09 ENCOUNTER — Encounter (HOSPITAL_COMMUNITY): Payer: Self-pay | Admitting: *Deleted

## 2016-09-09 DIAGNOSIS — R1310 Dysphagia, unspecified: Secondary | ICD-10-CM | POA: Diagnosis not present

## 2016-09-09 DIAGNOSIS — K640 First degree hemorrhoids: Secondary | ICD-10-CM | POA: Diagnosis not present

## 2016-09-09 DIAGNOSIS — Z1211 Encounter for screening for malignant neoplasm of colon: Secondary | ICD-10-CM

## 2016-09-09 DIAGNOSIS — M3500 Sicca syndrome, unspecified: Secondary | ICD-10-CM | POA: Insufficient documentation

## 2016-09-09 DIAGNOSIS — K449 Diaphragmatic hernia without obstruction or gangrene: Secondary | ICD-10-CM | POA: Diagnosis not present

## 2016-09-09 DIAGNOSIS — Z79899 Other long term (current) drug therapy: Secondary | ICD-10-CM | POA: Diagnosis not present

## 2016-09-09 DIAGNOSIS — Z9049 Acquired absence of other specified parts of digestive tract: Secondary | ICD-10-CM | POA: Insufficient documentation

## 2016-09-09 DIAGNOSIS — K5909 Other constipation: Secondary | ICD-10-CM | POA: Diagnosis not present

## 2016-09-09 DIAGNOSIS — R1013 Epigastric pain: Secondary | ICD-10-CM

## 2016-09-09 DIAGNOSIS — K2950 Unspecified chronic gastritis without bleeding: Secondary | ICD-10-CM | POA: Diagnosis not present

## 2016-09-09 DIAGNOSIS — K219 Gastro-esophageal reflux disease without esophagitis: Secondary | ICD-10-CM | POA: Diagnosis not present

## 2016-09-09 DIAGNOSIS — F419 Anxiety disorder, unspecified: Secondary | ICD-10-CM | POA: Diagnosis not present

## 2016-09-09 DIAGNOSIS — K5730 Diverticulosis of large intestine without perforation or abscess without bleeding: Secondary | ICD-10-CM | POA: Diagnosis not present

## 2016-09-09 DIAGNOSIS — K2270 Barrett's esophagus without dysplasia: Secondary | ICD-10-CM

## 2016-09-09 DIAGNOSIS — Z1212 Encounter for screening for malignant neoplasm of rectum: Secondary | ICD-10-CM

## 2016-09-09 DIAGNOSIS — K5900 Constipation, unspecified: Secondary | ICD-10-CM

## 2016-09-09 HISTORY — PX: MALONEY DILATION: SHX5535

## 2016-09-09 HISTORY — PX: COLONOSCOPY: SHX5424

## 2016-09-09 HISTORY — PX: ESOPHAGOGASTRODUODENOSCOPY: SHX5428

## 2016-09-09 HISTORY — PX: BIOPSY: SHX5522

## 2016-09-09 LAB — COMPREHENSIVE METABOLIC PANEL
ALK PHOS: 73 IU/L (ref 39–117)
ALT: 16 IU/L (ref 0–32)
AST: 24 IU/L (ref 0–40)
Albumin/Globulin Ratio: 1.4 (ref 1.2–2.2)
Albumin: 4.4 g/dL (ref 3.5–5.5)
BILIRUBIN TOTAL: 0.6 mg/dL (ref 0.0–1.2)
BUN / CREAT RATIO: 15 (ref 9–23)
BUN: 11 mg/dL (ref 6–24)
CHLORIDE: 101 mmol/L (ref 96–106)
CO2: 26 mmol/L (ref 18–29)
Calcium: 9.6 mg/dL (ref 8.7–10.2)
Creatinine, Ser: 0.73 mg/dL (ref 0.57–1.00)
GFR calc Af Amer: 114 mL/min/{1.73_m2} (ref >59)
GFR calc non Af Amer: 99 mL/min/{1.73_m2} (ref >59)
GLUCOSE: 46 mg/dL — AB (ref 65–99)
Globulin, Total: 3.2 g/dL (ref 1.5–4.5)
Potassium: 4 mmol/L (ref 3.5–5.2)
Sodium: 142 mmol/L (ref 134–144)
Total Protein: 7.6 g/dL (ref 6.0–8.5)

## 2016-09-09 LAB — LIPID PANEL
CHOL/HDL RATIO: 3.1 ratio (ref 0.0–4.4)
Cholesterol, Total: 206 mg/dL — ABNORMAL HIGH (ref 100–199)
HDL: 67 mg/dL (ref >39)
LDL Calculated: 127 mg/dL — ABNORMAL HIGH (ref 0–99)
TRIGLYCERIDES: 58 mg/dL (ref 0–149)
VLDL Cholesterol Cal: 12 mg/dL (ref 5–40)

## 2016-09-09 LAB — FOLLICLE STIMULATING HORMONE: FSH: 80.2 m[IU]/mL

## 2016-09-09 LAB — TSH: TSH: 3.63 u[IU]/mL (ref 0.450–4.500)

## 2016-09-09 LAB — CBC
HEMATOCRIT: 42 % (ref 34.0–46.6)
HEMOGLOBIN: 14 g/dL (ref 11.1–15.9)
MCH: 30.4 pg (ref 26.6–33.0)
MCHC: 33.3 g/dL (ref 31.5–35.7)
MCV: 91 fL (ref 79–97)
PLATELETS: 337 10*3/uL (ref 150–379)
RBC: 4.6 x10E6/uL (ref 3.77–5.28)
RDW: 13.9 % (ref 12.3–15.4)
WBC: 2.9 10*3/uL — ABNORMAL LOW (ref 3.4–10.8)

## 2016-09-09 LAB — HEMOGLOBIN A1C
Est. average glucose Bld gHb Est-mCnc: 108 mg/dL
Hgb A1c MFr Bld: 5.4 % (ref 4.8–5.6)

## 2016-09-09 LAB — VITAMIN D 25 HYDROXY (VIT D DEFICIENCY, FRACTURES): VIT D 25 HYDROXY: 39 ng/mL (ref 30.0–100.0)

## 2016-09-09 SURGERY — COLONOSCOPY
Anesthesia: Moderate Sedation

## 2016-09-09 MED ORDER — MEPERIDINE HCL 100 MG/ML IJ SOLN
INTRAMUSCULAR | Status: AC
  Filled 2016-09-09: qty 2

## 2016-09-09 MED ORDER — MEPERIDINE HCL 100 MG/ML IJ SOLN
INTRAMUSCULAR | Status: DC | PRN
  Administered 2016-09-09: 50 mg via INTRAVENOUS
  Administered 2016-09-09: 25 mg via INTRAVENOUS

## 2016-09-09 MED ORDER — LIDOCAINE VISCOUS 2 % MT SOLN
OROMUCOSAL | Status: AC
  Filled 2016-09-09: qty 15

## 2016-09-09 MED ORDER — SODIUM CHLORIDE 0.9 % IV SOLN
INTRAVENOUS | Status: DC
  Administered 2016-09-09: 1000 mL via INTRAVENOUS

## 2016-09-09 MED ORDER — MIDAZOLAM HCL 5 MG/5ML IJ SOLN
INTRAMUSCULAR | Status: AC
  Filled 2016-09-09: qty 10

## 2016-09-09 MED ORDER — MIDAZOLAM HCL 5 MG/5ML IJ SOLN
INTRAMUSCULAR | Status: DC | PRN
  Administered 2016-09-09: 2 mg via INTRAVENOUS
  Administered 2016-09-09 (×3): 1 mg via INTRAVENOUS

## 2016-09-09 MED ORDER — ONDANSETRON HCL 4 MG/2ML IJ SOLN
INTRAMUSCULAR | Status: DC | PRN
  Administered 2016-09-09: 4 mg via INTRAVENOUS

## 2016-09-09 MED ORDER — ONDANSETRON HCL 4 MG/2ML IJ SOLN
INTRAMUSCULAR | Status: AC
  Filled 2016-09-09: qty 2

## 2016-09-09 MED ORDER — LIDOCAINE VISCOUS 2 % MT SOLN
OROMUCOSAL | Status: DC | PRN
  Administered 2016-09-09: 1 via OROMUCOSAL

## 2016-09-09 NOTE — H&P (Signed)
@LOGO @   Primary Care Physician:  Vic Blackbird, MD Primary Gastroenterologist:  Dr. Gala Romney  Pre-Procedure History & Physical: HPI:  Yvette Mayer is a 46 y.o. female here for screening colonoscopy. Chronic constipation.  Better with Linzess. Worsening GERD recently. Vague esophageal dysphagia.  Negative EGD 2012. Responded nicely to empiric dilation (56 French Maloney dilation).  Past Medical History:  Diagnosis Date  . Abdominal pain 06/12/2014  . Allergy   . Anemia    iron 1 yr ago, normal hgb 7/12  . Anxiety   . Asthma    pt says no-bronchitis  . Bloated abdomen 10/15/2015  . Chronic back pain   . Chronic headaches   . Chronic neck pain   . Chronic sinusitis   . Current use of estrogen therapy 04/16/2015  . Dyspareunia 09/12/2014  . Elevated TSH 04/16/2015  . Fatigue 04/10/2015  . Fibromyalgia   . Gas 10/15/2015  . GERD (gastroesophageal reflux disease)   . Herpes simplex virus (HSV) infection   . Hot flashes 04/10/2015  . Hyperlipidemia   . Kidney stone on right side 10/24/2015  . Mental disorder   . Ovarian cyst, right   . Paresthesias   . Shortness of breath   . Sinus drainage   . Sjoegren syndrome ([place])   . Symptoms, such as flushing, sleeplessness, headache, lack of concentration, associated with the menopause 04/10/2015  . TMJ (dislocation of temporomandibular joint)   . Vaginal discharge 08/08/2015  . Vertigo   . Yeast infection 10/15/2015    Past Surgical History:  Procedure Laterality Date  . ABDOMINAL HYSTERECTOMY    . CESAREAN SECTION     x2  . CHOLECYSTECTOMY  2005   cholelithiasis  . ESOPHAGOGASTRODUODENOSCOPY  01/23/2011   NKN:LZJQBHALPFX undulating Z-line vs short segment Barrett s/p bx (NOT BARRETT's)/small HH otherwise normal  . MALONEY DILATION  01/23/2011   Procedure: Venia Minks DILATION;  Surgeon: Daneil Dolin, MD;  Location: [facility];  Service: Endoscopy;  Laterality: N/A;  . OVARIAN CYST REMOVAL  92 removal of cysts from behind ovaries   . PARTIAL HYSTERECTOMY  93  . SEPTOPLASTY     2011  . TEMPOROMANDIBULAR JOINT SURGERY    . TONSILLECTOMY     2010  . TURBINATE RESECTION Bilateral 05/29/2013   Procedure: TURBINATE RESECTION;  Surgeon: Ascencion Dike, MD;  Location: [facility];  Service: ENT;  Laterality: Bilateral;  . UPPER GASTROINTESTINAL ENDOSCOPY      Prior to Admission medications   Medication Sig Start Date End Date Taking? Authorizing Provider  antiseptic oral rinse (BIOTENE) LIQD 15 mLs by Mouth Rinse route as needed for dry mouth.   Yes Historical Provider, MD  cycloSPORINE (RESTASIS) 0.05 % ophthalmic emulsion Place 1 drop into both eyes 2 (two) times daily.   Yes Historical Provider, MD  DULoxetine (CYMBALTA) 30 MG capsule Take 1 capsule (30 mg total) by mouth daily. 08/24/16  Yes Alycia Rossetti, MD  famotidine (PEPCID) 20 MG tablet Take 1 tablet (20 mg total) by mouth daily. 04/15/16  Yes Estill Dooms, NP  linaclotide [facility]) 145 MCG CAPS capsule Take 1 capsule (145 mcg total) by mouth daily before breakfast. 07/15/16  Yes Alycia Rossetti, MD  polyethylene glycol-electrolytes (TRILYTE) 420 g solution Take 4,000 mLs by mouth as directed. 08/03/16  Yes Daneil Dolin, MD  Polyvinyl Alcohol-Povidone (REFRESH OP) Apply 1 drop to eye 3 (three) times daily as needed (dry eyes).   Yes Historical Provider, MD  valACYclovir (  VALTREX) 1000 MG tablet TAKE ONE TABLET BY MOUTH TWICE DAILY 08/17/16  Yes Alycia Rossetti, MD  azaTHIOprine (IMURAN) 50 MG tablet Take 1 tablet (50 mg total) by mouth daily. 08/13/16   Naitik Panwala, PA-C  cephALEXin (KEFLEX) 500 MG capsule Take 1 capsule (500 mg total) by mouth 4 (four) times daily. Patient not taking: Reported on 09/04/2016 08/17/16   Milton Ferguson, MD  cyclobenzaprine (FLEXERIL) 5 MG tablet Take 1 tablet (5 mg total) by mouth 3 (three) times daily as needed for muscle spasms. Do not take while driving or operating equipment. 09/04/16   Alycia Rossetti, MD   ibuprofen (ADVIL,MOTRIN) 200 MG tablet Take 400 mg by mouth daily as needed for headache or moderate pain.    Historical Provider, MD  pilocarpine (SALAGEN) 5 MG tablet Take 1 tablet (5 mg total) by mouth 2 (two) times daily. 08/13/16   Naitik Panwala, PA-C    Allergies as of 08/03/2016 - Review Complete 08/03/2016  Allergen Reaction Noted  . Hydrocodone-acetaminophen Itching 01/13/2011  . Percocet [oxycodone-acetaminophen] Itching 01/13/2011  . Plaquenil [hydroxychloroquine sulfate] Rash 04/16/2015    Family History  Problem Relation Age of Onset  . Hypertension Mother   . Thyroid disease Sister     overactive  . Bronchitis Son   . Heart disease Maternal Grandmother   . Hypertension Maternal Grandmother   . Alcohol abuse Maternal Grandfather   . Scoliosis Son   . Other Maternal Aunt     Sjeogren syndrome  . Other Maternal Aunt     Sjoegren syndrome  . Other Maternal Aunt     Sjoegren syndrome  . Anesthesia problems Neg Hx   . Malignant hyperthermia Neg Hx   . Hypotension Neg Hx   . Pseudochol deficiency Neg Hx     Social History   Social History  . Marital status: Legally Separated    Spouse name: N/A  . Number of children: 3  . Years of education: N/A   Occupational History  . [employer]   Social History Main Topics  . Smoking status: Never Smoker  . Smokeless tobacco: Never Used  . Alcohol use No  . Drug use: No  . Sexual activity: Not Currently    Birth control/ protection: Surgical     Comment: hyst   Other Topics Concern  . Not on file   Social History Narrative  . No narrative on file    Review of Systems: See HPI, otherwise negative ROS  Physical Exam: BP 107/65   Pulse (!) 52   Temp 97.7 F (36.5 C) (Oral)   Resp 15   Ht 5\' 5"  (1.651 m)   Wt 147 lb (66.7 kg)   SpO2 100%   BMI 24.46 kg/m  General:   Alert,  Well-developed, well-nourished, pleasant and cooperative in NAD Skin:  Intact without significant lesions or  rashes.  Lungs:  Clear throughout to auscultation.   No wheezes, crackles, or rhonchi. No acute distress. Heart:  Regular rate and rhythm; no murmurs, clicks, rubs,  or gallops. Abdomen: Non-distended, normal bowel sounds.  Soft and nontender without appreciable mass or hepatosplenomegaly.  Pulses:  Normal pulses noted. Extremities:  Without clubbing or edema.  Impression:  Pleasant 46 year old lady with recurrent esophageal dysphagia setting Sjorgrens  syndrome. Only intermittent acid suppression therapy. Responded nicely to empiric dilation 2012. Also, needs first-ever average risk screening colonoscopy.  I've offered the patient EGD/EGD as feasible/appropriate today along with a screening colonoscopy.  The risks,  benefits, limitations, imponderables and alternatives regarding both EGD and colonoscopy have been reviewed with the patient. Questions have been answered. All parties agreeable.           Notice: This dictation was prepared with Dragon dictation along with smaller phrase technology. Any transcriptional errors that result from this process are unintentional and may not be corrected upon review.

## 2016-09-09 NOTE — Telephone Encounter (Signed)
Pt called stating that she would like to know the results of her blood work that she had yesterday. Please contact pt

## 2016-09-09 NOTE — Discharge Instructions (Addendum)
°Colonoscopy °Discharge Instructions ° °Read the instructions outlined below and refer to this sheet in the next few weeks. These discharge instructions provide you with general information on caring for yourself after you leave the hospital. Your doctor may also give you specific instructions. While your treatment has been planned according to the most current medical practices available, unavoidable complications occasionally occur. If you have any problems or questions after discharge, call Dr. Rourk at [phone]. °ACTIVITY °· You may resume your regular activity, but move at a slower pace for the next 24 hours.  °· Take frequent rest periods for the next 24 hours.  °· Walking will help get rid of the air and reduce the bloated feeling in your belly (abdomen).  °· No driving for 24 hours (because of the medicine (anesthesia) used during the test).   °· Do not sign any important legal documents or operate any machinery for 24 hours (because of the anesthesia used during the test).  °NUTRITION °· Drink plenty of fluids.  °· You may resume your normal diet as instructed by your doctor.  °· Begin with a light meal and progress to your normal diet. Heavy or fried foods are harder to digest and may make you feel sick to your stomach (nauseated).  °· Avoid alcoholic beverages for 24 hours or as instructed.  °MEDICATIONS °· You may resume your normal medications unless your doctor tells you otherwise.  °WHAT YOU CAN EXPECT TODAY °· Some feelings of bloating in the abdomen.  °· Passage of more gas than usual.  °· Spotting of blood in your stool or on the toilet paper.  °IF YOU HAD POLYPS REMOVED DURING THE COLONOSCOPY: °· No aspirin products for 7 days or as instructed.  °· No alcohol for 7 days or as instructed.  °· Eat a soft diet for the next 24 hours.  °FINDING OUT THE RESULTS OF YOUR TEST °Not all test results are available during your visit. If your test results are not back during the visit, make an appointment  with your caregiver to find out the results. Do not assume everything is normal if you have not heard from your caregiver or the medical facility. It is important for you to follow up on all of your test results.  °SEEK IMMEDIATE MEDICAL ATTENTION IF: °· You have more than a spotting of blood in your stool.  °· Your belly is swollen (abdominal distention).  °· You are nauseated or vomiting.  °· You have a temperature over 101.  °· You have abdominal pain or discomfort that is severe or gets worse throughout the day.  °EGD °Discharge instructions °Please read the instructions outlined below and refer to this sheet in the next few weeks. These discharge instructions provide you with general information on caring for yourself after you leave the hospital. Your doctor may also give you specific instructions. While your treatment has been planned according to the most current medical practices available, unavoidable complications occasionally occur. If you have any problems or questions after discharge, please call your doctor. °ACTIVITY °· You may resume your regular activity but move at a slower pace for the next 24 hours.  °· Take frequent rest periods for the next 24 hours.  °· Walking will help expel (get rid of) the air and reduce the bloated feeling in your abdomen.  °· No driving for 24 hours (because of the anesthesia (medicine) used during the test).  °· You may shower.  °· Do not sign any important   legal documents or operate any machinery for 24 hours (because of the anesthesia used during the test).  NUTRITION  Drink plenty of fluids.   You may resume your normal diet.   Begin with a light meal and progress to your normal diet.   Avoid alcoholic beverages for 24 hours or as instructed by your caregiver.  MEDICATIONS  You may resume your normal medications unless your caregiver tells you otherwise.  WHAT YOU CAN EXPECT TODAY  You may experience abdominal discomfort such as a feeling of fullness  or gas pains.  FOLLOW-UP  Your doctor will discuss the results of your test with you.  SEEK IMMEDIATE MEDICAL ATTENTION IF ANY OF THE FOLLOWING OCCUR:  Excessive nausea (feeling sick to your stomach) and/or vomiting.   Severe abdominal pain and distention (swelling).   Trouble swallowing.   Temperature over 101 F (37.8 C).   Rectal bleeding or vomiting of blood.    Stop famotidine; begin Protonix 40 mg daily  GERD, constipation and diverticulosis information provided  Continue Linzess daily  Begin Benefiber 1 tablespoon twice daily to regimen  Office visit with [place] in 3 months  Further recommendations to follow pending review of pathology report  Repeat colonoscopy in 10 years for screening purposes    Gastroesophageal Reflux Disease, Adult Normally, food travels down the esophagus and stays in the stomach to be digested. If a person has gastroesophageal reflux disease (GERD), food and stomach acid move back up into the esophagus. When this happens, the esophagus becomes sore and swollen (inflamed). Over time, GERD can make small holes (ulcers) in the lining of the esophagus. Follow these instructions at home: Diet   Follow a diet as told by your doctor. You may need to avoid foods and drinks such as:  Coffee and tea (with or without caffeine).  Drinks that contain alcohol.  Energy drinks and sports drinks.  Carbonated drinks or sodas.  Chocolate and cocoa.  Peppermint and mint flavorings.  Garlic and onions.  Horseradish.  Spicy and acidic foods, such as peppers, chili powder, curry powder, vinegar, hot sauces, and BBQ sauce.  Citrus fruit juices and citrus fruits, such as oranges, lemons, and limes.  Tomato-based foods, such as red sauce, chili, salsa, and pizza with red sauce.  Fried and fatty foods, such as donuts, french fries, potato chips, and high-fat dressings.  High-fat meats, such as hot dogs, rib eye steak, sausage, ham, and  bacon.  High-fat dairy items, such as whole milk, butter, and cream cheese.  Eat small meals often. Avoid eating large meals.  Avoid drinking large amounts of liquid with your meals.  Avoid eating meals during the 2-3 hours before bedtime.  Avoid lying down right after you eat.  Do not exercise right after you eat. General instructions   Pay attention to any changes in your symptoms.  Take over-the-counter and prescription medicines only as told by your doctor. Do not take aspirin, ibuprofen, or other NSAIDs unless your doctor says it is okay.  Do not use any tobacco products, including cigarettes, chewing tobacco, and e-cigarettes. If you need help quitting, ask your doctor.  Wear loose clothes. Do not wear anything tight around your waist.  Raise (elevate) the head of your bed about 6 inches (15 cm).  Try to lower your stress. If you need help doing this, ask your doctor.  If you are overweight, lose an amount of weight that is healthy for you. Ask your doctor about a safe weight loss goal.  Keep all follow-up visits as told by your doctor. This is important. Contact a doctor if:  You have new symptoms.  You lose weight and you do not know why it is happening.  You have trouble swallowing, or it hurts to swallow.  You have wheezing or a cough that keeps happening.  Your symptoms do not get better with treatment.  You have a hoarse voice. Get help right away if:  You have pain in your arms, neck, jaw, teeth, or back.  You feel sweaty, dizzy, or light-headed.  You have chest pain or shortness of breath.  You throw up (vomit) and your throw up looks like blood or coffee grounds.  You pass out (faint).  Your poop (stool) is bloody or black.  You cannot swallow, drink, or eat.    Constipation, Adult Constipation is when a person has fewer bowel movements in a week than normal, has difficulty having a bowel movement, or has stools that are dry, hard, or  larger than normal. Constipation may be caused by an underlying condition. It may become worse with age if a person takes certain medicines and does not take in enough fluids. Follow these instructions at home: Eating and drinking    Eat foods that have a lot of fiber, such as fresh fruits and vegetables, whole grains, and beans.  Limit foods that are high in fat, low in fiber, or overly processed, such as french fries, hamburgers, cookies, candies, and soda.  Drink enough fluid to keep your urine clear or pale yellow. General instructions   Exercise regularly or as told by your health care provider.  Go to the restroom when you have the urge to go. Do not hold it in.  Take over-the-counter and prescription medicines only as told by your health care provider. These include any fiber supplements.  Practice pelvic floor retraining exercises, such as deep breathing while relaxing the lower abdomen and pelvic floor relaxation during bowel movements.  Watch your condition for any changes.  Keep all follow-up visits as told by your health care provider. This is important. Contact a health care provider if:  You have pain that gets worse.  You have a fever.  You do not have a bowel movement after 4 days.  You vomit.  You are not hungry.  You lose weight.  You are bleeding from the anus.  You have thin, pencil-like stools. Get help right away if:  You have a fever and your symptoms suddenly get worse.  You leak stool or have blood in your stool.  Your abdomen is bloated.  You have severe pain in your abdomen.  You feel dizzy or you faint. This information is not intended to replace advice given to you by your health care provider. Make sure you discuss any questions you have with your health care provider. Document Released: 02/14/2004 Document Revised: 12/06/2015 Document Reviewed: 11/06/2015 Elsevier Interactive Patient Education  2017 [employer]   Diverticulosis Diverticulosis is a condition that develops when small pouches (diverticula) form in the wall of the large intestine (colon). The colon is where water is absorbed and stool is formed. The pouches form when the inside layer of the colon pushes through weak spots in the outer layers of the colon. You may have a few pouches or many of them. What are the causes? The cause of this condition is not known. What increases the risk? The following factors may make you more likely to develop this condition:  Being  older than age 36. Your risk for this condition increases with age. Diverticulosis is rare among people younger than age 40. By age 3, many people have it.  Eating a low-fiber diet.  Having frequent constipation.  Being overweight.  Not getting enough exercise.  Smoking.  Taking over-the-counter pain medicines, like aspirin and ibuprofen.  Having a family history of diverticulosis. What are the signs or symptoms? In most people, there are no symptoms of this condition. If you do have symptoms, they may include:  Bloating.  Cramps in the abdomen.  Constipation or diarrhea.  Pain in the lower left side of the abdomen. How is this diagnosed? This condition is most often diagnosed during an exam for other colon problems. Because diverticulosis usually has no symptoms, it often cannot be diagnosed independently. This condition may be diagnosed by:  Using a flexible scope to examine the colon (colonoscopy).  Taking an X-ray of the colon after dye has been put into the colon (barium enema).  Doing a CT scan. How is this treated? You may not need treatment for this condition if you have never developed an infection related to diverticulosis. If you have had an infection before, treatment may include:  Eating a high-fiber diet. This may include eating more fruits, vegetables, and grains.  Taking a fiber supplement.  Taking a live bacteria supplement  (probiotic).  Taking medicine to relax your colon.  Taking antibiotic medicines. Follow these instructions at home:  Drink 6-8 glasses of water or more each day to prevent constipation.  Try not to strain when you have a bowel movement.  If you have had an infection before:  Eat more fiber as directed by your health care provider or your diet and nutrition specialist (dietitian).  Take a fiber supplement or probiotic, if your health care provider approves.  Take over-the-counter and prescription medicines only as told by your health care provider.  If you were prescribed an antibiotic, take it as told by your health care provider. Do not stop taking the antibiotic even if you start to feel better.  Keep all follow-up visits as told by your health care provider. This is important. Contact a health care provider if:  You have pain in your abdomen.  You have bloating.  You have cramps.  You have not had a bowel movement in 3 days. Get help right away if:  Your pain gets worse.  Your bloating becomes very bad.  You have a fever or chills, and your symptoms suddenly get worse.  You vomit.  You have bowel movements that are bloody or black.  You have bleeding from your rectum. Summary  Diverticulosis is a condition that develops when small pouches (diverticula) form in the wall of the large intestine (colon).  You may have a few pouches or many of them.  This condition is most often diagnosed during an exam for other colon problems.  If you have had an infection related to diverticulosis, treatment may include increasing the fiber in your diet, taking supplements, or taking medicines. This information is not intended to replace advice given to you by your health care provider. Make sure you discuss any questions you have with your health care provider. Document Released: 02/13/2004 Document Revised: 04/06/2016 Document Reviewed: 04/06/2016 Elsevier Interactive Patient  Education  2017 [employer].

## 2016-09-09 NOTE — Op Note (Signed)
[facility] Patient Name: Yvette Mayer Procedure Date: 09/09/2016 8:56 AM MRN: [id number] Date of Birth: 1970-06-25 Attending MD: Norvel Richards , MD CSN: [id number] Age: 46 Admit Type: Outpatient Procedure:                Ileo-colonoscopy - screening Indications:              Screening for colorectal malignant neoplasm Providers:                Norvel Richards, MD, Lurline Del, RN, Rosina Lowenstein, RN Referring MD:             Modena Nunnery. [place] Medicines:                Midazolam 5 mg IV, Meperidine 75 mg IV, Ondansetron                            4 mg IV Complications:            No immediate complications. Estimated Blood Loss:     Estimated blood loss: none. Procedure:                Pre-Anesthesia Assessment:                           - Prior to the procedure, a History and Physical                            was performed, and patient medications and                            allergies were reviewed. The patient's tolerance of                            previous anesthesia was also reviewed. The risks                            and benefits of the procedure and the sedation                            options and risks were discussed with the patient.                            All questions were answered, and informed consent                            was obtained. Prior Anticoagulants: The patient has                            taken no previous anticoagulant or antiplatelet                            agents. ASA Grade Assessment: II - A patient with  mild systemic disease. After reviewing the risks                            and benefits, the patient was deemed in                            satisfactory condition to undergo the procedure.                           After obtaining informed consent, the colonoscope                            was passed under direct vision. Throughout the        procedure, the patient's blood pressure, pulse, and                            oxygen saturations were monitored continuously. The                            EC-[id number] ([id number]) scope was introduced through                            the anus and advanced to the the terminal ileum.                            The colonoscopy was performed without difficulty.                            The patient tolerated the procedure well. The                            quality of the bowel preparation was adequate. The                            terminal ileum, ileocecal valve, appendiceal                            orifice, and rectum were photographed. The entire                            colon was well visualized. Scope In: 9:00:20 AM Scope Out: 9:16:37 AM Scope Withdrawal Time: 0 hours 10 minutes 26 seconds  Total Procedure Duration: 0 hours 16 minutes 17 seconds  Findings:      The perianal and digital rectal examinations were normal.      Multiple small and large-mouthed diverticula were found in the entire       colon.      Internal hemorrhoids were found during retroflexion. The hemorrhoids       were Grade I (internal hemorrhoids that do not prolapse).      The exam was otherwise without abnormality on direct and retroflexion       views. Impression:               - Moderate diverticulosis in the entire examined  colon.                           - Internal hemorrhoids.                           - The examination was otherwise normal on direct                            and retroflexion views.                           - No specimens collected. Moderate Sedation:      Moderate (conscious) sedation was administered by the endoscopy nurse       and supervised by the endoscopist. The following parameters were       monitored: oxygen saturation, heart rate, blood pressure, respiratory       rate, EKG, adequacy of pulmonary ventilation, and response to care.        Total physician intraservice time was 37 minutes. Recommendation:           - Patient has a contact number available for                            emergencies. The signs and symptoms of potential                            delayed complications were discussed with the                            patient. Return to normal activities tomorrow.                            Written discharge instructions were provided to the                            patient.                           - Resume previous diet. Continue Linzess daily. Add                            Benefiber 1 tablespoon twice daily to regimen.                           - Continue present medications.                           - Repeat colonoscopy in 10 years for screening                            purposes.                           - Return to GI clinic in 3 months. Procedure Code(s):        --- Professional ---  [phone], Colonoscopy, flexible; diagnostic, including                            collection of specimen(s) by brushing or washing,                            when performed (separate procedure)                           99152, Moderate sedation services provided by the                            same physician or other qualified health care                            professional performing the diagnostic or                            therapeutic service that the sedation supports,                            requiring the presence of an independent trained                            observer to assist in the monitoring of the                            patient's level of consciousness and physiological                            status; initial 15 minutes of intraservice time,                            patient age 13 years or older                           [phone], Moderate sedation services; each additional                            15 minutes intraservice time Diagnosis Code(s):        ---  Professional ---                           Z12.11, Encounter for screening for malignant                            neoplasm of colon                           K64.0, First degree hemorrhoids                           K57.30, Diverticulosis of large intestine without                            perforation or  abscess without bleeding CPT copyright 2016 [employer] Association. All rights reserved. The codes documented in this report are preliminary and upon coder review may  be revised to meet current compliance requirements. Cristopher Estimable. Wonda Goodgame, MD Norvel Richards, MD 09/09/2016 9:31:54 AM This report has been signed electronically. Number of Addenda: 0

## 2016-09-10 ENCOUNTER — Telehealth: Payer: Self-pay | Admitting: Obstetrics & Gynecology

## 2016-09-10 NOTE — Telephone Encounter (Signed)
Patient informed that Yvette Mayer had not reviewed labs yet but would call her with results when possible. Pt verbalized understanding.

## 2016-09-10 NOTE — Op Note (Signed)
NAME:  HOMER, PFEIFER                  ACCOUNT NO.:  MEDICAL RECORD NO.:  [id number]  LOCATION:                                 FACILITY:  PHYSICIAN:  R. Garfield Cornea, MD Nenzel:  Jun 14, 1970  DATE OF PROCEDURE:  09/09/2016 DATE OF DISCHARGE:                              OPERATIVE REPORT   PROCEDURE:  EGD with Venia Minks dilation followed by esophageal biopsy.  INDICATIONS FOR PROCEDURE:  A 46 year old lady with recurrent esophageal dysphagia, history of Sjogren's syndrome, and poorly controlled GERD. EGD is now being done.  Risks, benefits, limitations, alternatives, and imponderables have been discussed and questions answered.  Please see documentation medical record.  PROCEDURE NOTE:  O2 saturation, blood pressure, pulse, respirations were monitored throughout the entire procedure.  Versed 4 mg IV, Demerol 75 mg IV in divided doses.  Xylocaine gel for anesthesia.  Zofran 4 mg IV.  INSTRUMENT:  Pentax video chip system.  FINDINGS:  Examination of the tubular esophagus revealed a 1.5 cm_ "Tongue" of salmon colored epithelium.  No esophagitis.  The tubular esophagus was patent throughout its course.  Stomach empty; insufflated well with air.  Small hiatal hernia present, gastric mucosa appeared normal. Patent pylorus.  Normal-appearing first and second portion of the duodenum.  THERAPEUTIC/DIAGNOSTIC MANEUVERS PERFORMED:  Scope was withdrawn.  A 54- French Maloney dilators passed to full insertion with no resistance subsequently, a 56-French Maloney dilator was passed to full insertion with no resistance.  A look back revealed a slight amount of blood at the GE junction, but no apparent complication.  No major tear seen.  Finally, the abnormal distal esophagus were biopsied for histologic study.  The patient tolerated the procedure well.   IMPRESSION: 1. Abnormal distal esophagus; query short segment Barrett's,    status post biopsy following Maloney dilation  as described above. 2. Small hiatal hernia.  RECOMMENDATIONS: 1. Stopped Pepcid. 2. Begin Protonix 40 mg daily. 3. Follow up on pathology. 4. See colonoscopy report. 5. Office visit with [place] in 3 months.     Bridgette Habermann, MD Quentin Ore     RMR/MEDQ  D:  09/09/2016  T:  09/09/2016  Job:  [id number]  cc:   [facility]  Derrek Monaco, FNP  Vic Blackbird, MD

## 2016-09-10 NOTE — Telephone Encounter (Signed)
Pt aware of labs, she is PM, and WBC is low, make appt with Dr Buelah Manis, lipids, TSH, kidney and liver function good, A1c and vitamin D good.

## 2016-09-11 ENCOUNTER — Encounter (HOSPITAL_COMMUNITY): Payer: Self-pay | Admitting: Internal Medicine

## 2016-09-11 ENCOUNTER — Encounter: Payer: Self-pay | Admitting: Internal Medicine

## 2016-09-11 NOTE — Progress Notes (Signed)
.  rmr 

## 2016-09-15 ENCOUNTER — Ambulatory Visit: Payer: BLUE CROSS/BLUE SHIELD | Admitting: Family Medicine

## 2016-09-21 NOTE — Progress Notes (Signed)
REVIEWED-NO ADDITIONAL RECOMMENDATIONS. 

## 2016-09-28 ENCOUNTER — Encounter: Payer: Self-pay | Admitting: Family Medicine

## 2016-09-28 ENCOUNTER — Ambulatory Visit (INDEPENDENT_AMBULATORY_CARE_PROVIDER_SITE_OTHER): Payer: BLUE CROSS/BLUE SHIELD | Admitting: Family Medicine

## 2016-09-28 ENCOUNTER — Other Ambulatory Visit: Payer: Self-pay | Admitting: *Deleted

## 2016-09-28 VITALS — BP 102/68 | HR 90 | Temp 97.9°F | Resp 14 | Ht 64.0 in | Wt 150.0 lb

## 2016-09-28 DIAGNOSIS — R888 Abnormal findings in other body fluids and substances: Secondary | ICD-10-CM | POA: Diagnosis not present

## 2016-09-28 DIAGNOSIS — D7289 Other specified disorders of white blood cells: Secondary | ICD-10-CM

## 2016-09-28 DIAGNOSIS — D72819 Decreased white blood cell count, unspecified: Secondary | ICD-10-CM | POA: Diagnosis not present

## 2016-09-28 DIAGNOSIS — N760 Acute vaginitis: Secondary | ICD-10-CM

## 2016-09-28 LAB — CBC WITH DIFFERENTIAL/PLATELET
Basophils Absolute: 96 cells/uL (ref 0–200)
Basophils Relative: 3 %
EOS PCT: 3 %
Eosinophils Absolute: 96 cells/uL (ref 15–500)
HCT: 39.8 % (ref 35.0–45.0)
HEMOGLOBIN: 13.1 g/dL (ref 12.0–15.0)
Lymphocytes Relative: 53 %
Lymphs Abs: 1696 cells/uL (ref 850–3900)
MCH: 30.5 pg (ref 27.0–33.0)
MCHC: 32.9 g/dL (ref 32.0–36.0)
MCV: 92.6 fL (ref 80.0–100.0)
MPV: 9.3 fL (ref 7.5–12.5)
Monocytes Absolute: 320 cells/uL (ref 200–950)
Monocytes Relative: 10 %
Neutro Abs: 992 cells/uL — ABNORMAL LOW (ref 1500–7800)
Neutrophils Relative %: 31 %
Platelets: 350 10*3/uL (ref 140–400)
RBC: 4.3 MIL/uL (ref 3.80–5.10)
RDW: 14 % (ref 11.0–15.0)
WBC: 3.2 10*3/uL — AB (ref 3.8–10.8)

## 2016-09-28 LAB — WET PREP FOR TRICH, YEAST, CLUE
Trich, Wet Prep: NONE SEEN
YEAST WET PREP: NONE SEEN

## 2016-09-28 MED ORDER — METRONIDAZOLE 500 MG PO TABS: 500.0000 mg | ORAL_TABLET | Freq: Three times a day (TID) | ORAL | 0 refills | Status: DC

## 2016-09-28 NOTE — Progress Notes (Signed)
   Subjective:    [name], female    DOB: [birth year], 46 y.o.   MRN: [id number]  Patient presents for Elevated WBC (is not fasting (had sweet tea)) Patient here concerned about abnormal white blood cell count. Her rheumatologist that checked a CBC back in March at that time her white blood cell count was 3.0. Prior to that she had normal white blood cell count this was in May 2017. She had a repeat performed on April 10 white blood cell count was 2.9 with normal platelets she did have a few atypical lymphocytes per her rheumatologist back in March She has not had any B symptoms she has not had any abnormal weight loss.  She also has known rectocele but since her colonoscopy a few weeks ago, has felt some irritation and mild discharge, wants to be checked for yeast infection, no abnormal vaginal bleeding    Review Of Systems:  GEN- denies fatigue, fever, weight loss,weakness, recent illness HEENT- denies eye drainage, change in vision, nasal discharge, CVS- denies chest pain, palpitations RESP- denies SOB, cough, wheeze ABD- denies N/V, change in stools, abd pain GU- denies dysuria, hematuria, dribbling, incontinence MSK- denies joint pain, muscle aches, injury Neuro- denies headache, dizziness, syncope, seizure activity       Objective:    BP 102/68   Pulse 90   Temp 97.9 F (36.6 C) (Oral)   Resp 14   Ht 5\' 4"  (1.626 m)   Wt 150 lb (68 kg)   SpO2 99%   BMI 25.75 kg/m  GEN- NAD, alert and oriented x3 HEENT- PERRL, EOMI, non injected sclera, pink conjunctiva, MMM, oropharynx clear Neck- Supple, no LAD CVS- RRR, no murmur RESP-CTAB ABD-NABS,soft,NT,ND GU- normal external genitalia, vaginal mucosa pink and moist, uterus absent no ovarian masse, minimal discharge        Assessment & Plan:      Problem List Items Addressed This Visit    None    Visit Diagnoses    Leukopenia, unspecified type    -  Primary   Non specific no symptoms, recheck CBC  with peripheral smear and EBV due to the atyical lymphocytes, pending results hematology referral   Relevant Orders   CBC with Differential/Platelet   Pathologist smear review   Epstein-Barr Virus VCA Antibody Panel   Atypical lymphocytes present on peripheral blood smear       Relevant Orders   CBC with Differential/Platelet   Pathologist smear review   Epstein-Barr Virus VCA Antibody Panel   Acute vaginitis       Wet prep done. defer rectocele to GYN who has already discussed treatment with her   Relevant Orders   WET PREP FOR [place], YEAST, CLUE      Note: This dictation was prepared with Dragon dictation along with smaller phrase technology. Any transcriptional errors that result from this process are unintentional.

## 2016-09-28 NOTE — Patient Instructions (Addendum)
f/u pending results

## 2016-09-29 ENCOUNTER — Telehealth: Payer: Self-pay | Admitting: *Deleted

## 2016-09-29 ENCOUNTER — Other Ambulatory Visit: Payer: Self-pay | Admitting: *Deleted

## 2016-09-29 LAB — EPSTEIN-BARR VIRUS VCA ANTIBODY PANEL
EBV NA IgG: 475 U/mL — ABNORMAL HIGH
EBV VCA IGG: 285 U/mL — AB

## 2016-09-29 LAB — PATHOLOGIST SMEAR REVIEW

## 2016-09-29 MED ORDER — CETIRIZINE HCL 10 MG PO TABS: 10.0000 mg | ORAL_TABLET | Freq: Every day | ORAL | 11 refills | Status: DC

## 2016-09-29 MED ORDER — METRONIDAZOLE 500 MG PO TABS: 500.0000 mg | ORAL_TABLET | Freq: Two times a day (BID) | ORAL | 0 refills | Status: DC

## 2016-09-29 MED ORDER — FLUCONAZOLE 150 MG PO TABS: 150.0000 mg | ORAL_TABLET | Freq: Once | ORAL | 0 refills | Status: AC

## 2016-09-29 NOTE — Telephone Encounter (Signed)
Received call from patient.   States that she would like "Flonase" for itching while taking ABTx.   Call placed to patient to clarify.   Inquired as to if patient requesting one time dose of Diflucan for yeast. Patient stated yes. Inquired as to if patient requires any medications for allergies to which patient replies "yes, I want some Zantac." Clarified if patient is requesting Zyrtec. Patient agreed.   MD made aware and approved requests.   Prescription sent to pharmacy.

## 2016-09-30 ENCOUNTER — Encounter: Payer: Self-pay | Admitting: Family Medicine

## 2016-10-16 ENCOUNTER — Ambulatory Visit: Payer: BLUE CROSS/BLUE SHIELD | Admitting: Adult Health

## 2016-10-20 DIAGNOSIS — M9902 Segmental and somatic dysfunction of thoracic region: Secondary | ICD-10-CM | POA: Diagnosis not present

## 2016-10-20 DIAGNOSIS — M9901 Segmental and somatic dysfunction of cervical region: Secondary | ICD-10-CM | POA: Diagnosis not present

## 2016-10-20 DIAGNOSIS — M546 Pain in thoracic spine: Secondary | ICD-10-CM | POA: Diagnosis not present

## 2016-10-20 DIAGNOSIS — M542 Cervicalgia: Secondary | ICD-10-CM | POA: Diagnosis not present

## 2016-11-03 ENCOUNTER — Encounter: Payer: Self-pay | Admitting: Adult Health

## 2016-11-03 ENCOUNTER — Ambulatory Visit (INDEPENDENT_AMBULATORY_CARE_PROVIDER_SITE_OTHER): Payer: BLUE CROSS/BLUE SHIELD | Admitting: Adult Health

## 2016-11-03 VITALS — BP 88/50 | HR 60 | Ht 64.0 in | Wt 150.5 lb

## 2016-11-03 DIAGNOSIS — R222 Localized swelling, mass and lump, trunk: Secondary | ICD-10-CM

## 2016-11-03 DIAGNOSIS — Z1212 Encounter for screening for malignant neoplasm of rectum: Secondary | ICD-10-CM | POA: Diagnosis not present

## 2016-11-03 DIAGNOSIS — Z01419 Encounter for gynecological examination (general) (routine) without abnormal findings: Secondary | ICD-10-CM

## 2016-11-03 DIAGNOSIS — L290 Pruritus ani: Secondary | ICD-10-CM | POA: Diagnosis not present

## 2016-11-03 DIAGNOSIS — N760 Acute vaginitis: Secondary | ICD-10-CM

## 2016-11-03 DIAGNOSIS — Z1211 Encounter for screening for malignant neoplasm of colon: Secondary | ICD-10-CM | POA: Diagnosis not present

## 2016-11-03 DIAGNOSIS — B9689 Other specified bacterial agents as the cause of diseases classified elsewhere: Secondary | ICD-10-CM | POA: Diagnosis not present

## 2016-11-03 DIAGNOSIS — N898 Other specified noninflammatory disorders of vagina: Secondary | ICD-10-CM | POA: Diagnosis not present

## 2016-11-03 LAB — POCT WET PREP (WET MOUNT)
CLUE CELLS WET PREP WHIFF POC: POSITIVE
WBC, Wet Prep HPF POC: POSITIVE

## 2016-11-03 LAB — HEMOCCULT GUIAC POC 1CARD (OFFICE): FECAL OCCULT BLD: NEGATIVE

## 2016-11-03 MED ORDER — HYDROCORTISONE ACE-PRAMOXINE 1-1 % RE FOAM: 1.0000 | Freq: Two times a day (BID) | RECTAL | 0 refills | Status: DC

## 2016-11-03 MED ORDER — METRONIDAZOLE 0.75 % VA GEL: 1.0000 | Freq: Two times a day (BID) | VAGINAL | 0 refills | Status: DC

## 2016-11-03 NOTE — Progress Notes (Signed)
[name], female   DOB: 1970-09-18, 46 y.o.   MRN: [id number] History of Present Illness: Yvette Mayer is a 46 year old black female, going though divorce in for well woman gyn exam, she is spp hysterectomy.She is complaining of rectal itching and low back pain and stomach hurts, just had colonoscopy in April. PCP is  Yvette Mayer.   Current Medications, Allergies, Past Medical History, Past Surgical History, Family History and Social History were reviewed in [place] record.     Review of Systems:  Patient denies any headaches, hearing loss, fatigue, blurred vision, shortness of breath, chest pain, problems with bowel movements, urination, or intercourse(not having sex). No joint pain or mood swings.See HPI for positives.   Physical Exam:BP (!) 88/50 (BP Location: Left Arm, Patient Position: Sitting, Cuff Size: Normal)   Pulse 60   Ht 5\' 4"  (1.626 m)   Wt 150 lb 8 oz (68.3 kg)   BMI 25.83 kg/m  General:  Well developed, well nourished, no acute distress Skin:  Warm and dry Neck:  Midline trachea, normal thyroid, good ROM, no lymphadenopathy Lungs; Clear to auscultation bilaterally Breast:  No dominant palpable mass, retraction, or nipple discharge Cardiovascular: Regular rate and rhythm Abdomen:  Soft, non tender, no hepatosplenomegaly Pelvic:  External genitalia is normal in appearance, no lesions.  The vagina is normal in appearance, with white discharge with odor, wet prep was positive for WBC and clue cells. Urethra has no lesions or masses. The cervix and uterus are absent.  No adnexal masses or tenderness noted.Bladder is non tender, no masses felt. Back: has oval 7 x 5 cm mass, mobile and tender right lower back above buttock, Yvette Mayer in for co exam, will get [place] to assess, spoke with Yvette Mayer at [facility], will get pelvic limited. Not sure of cystic or not.  Rectal: Good sphincter tone, no polyps, or hemorrhoids felt.  Hemoccult  negative. Extremities/musculoskeletal:  No swelling or varicosities noted, no clubbing or cyanosis Psych:  No mood changes, alert and cooperative,seems happy PHQ 2 score 0.  Impression: 1. Well woman exam with routine gynecological exam   2. Encounter for colorectal cancer screening   3. Vaginal discharge   4. BV (bacterial vaginosis)   5. Rectal itching   6. Palpable mass of lower back       Plan: Meds ordered this encounter  Medications  . metroNIDAZOLE (METROGEL VAGINAL) 0.75 % vaginal gel    Sig: Place 1 Applicatorful vaginally 2 (two) times daily.    Dispense:  70 g    Refill:  0    Order Specific Question:   Supervising Provider    Answer:   Yvette Mayer [[id number]]  . hydrocortisone-pramoxine (PROCTOFOAM HC) rectal foam    Sig: Place 1 applicator rectally 2 (two) times daily.    Dispense:  10 g    Refill:  0    Order Specific Question:   Supervising Provider    Answer:   Yvette Mayer [[id number]]  Pelvic US limited at [facility] 6/8 at 2:30 pm Physical in 1 year Mammogram yearly Colonoscopy per Yvette Yvette Mayer, F/U with Yvette Yvette Mayer if stomach continues to hurt

## 2016-11-06 ENCOUNTER — Ambulatory Visit (HOSPITAL_COMMUNITY)
Admission: RE | Admit: 2016-11-06 | Discharge: 2016-11-06 | Disposition: A | Discharge status: Home / Self Care | Payer: BLUE CROSS/BLUE SHIELD | Source: Ambulatory Visit | Attending: Adult Health | Admitting: Adult Health

## 2016-11-06 DIAGNOSIS — M545 Low back pain: Secondary | ICD-10-CM | POA: Diagnosis not present

## 2016-11-06 DIAGNOSIS — R222 Localized swelling, mass and lump, trunk: Secondary | ICD-10-CM | POA: Diagnosis not present

## 2016-11-09 ENCOUNTER — Telehealth: Payer: Self-pay | Admitting: Adult Health

## 2016-11-09 NOTE — Telephone Encounter (Signed)
Pt aware [place] did not show anything, since she has appt with Dr Buelah Manis Wednesday, can talk with her, may need CT, if continues to hurt

## 2016-11-11 ENCOUNTER — Ambulatory Visit (INDEPENDENT_AMBULATORY_CARE_PROVIDER_SITE_OTHER): Payer: BLUE CROSS/BLUE SHIELD | Admitting: Family Medicine

## 2016-11-11 ENCOUNTER — Encounter: Payer: Self-pay | Admitting: Family Medicine

## 2016-11-11 VITALS — BP 112/64 | HR 78 | Temp 98.3°F | Resp 14 | Ht 64.0 in | Wt 150.0 lb

## 2016-11-11 DIAGNOSIS — M545 Low back pain, unspecified: Secondary | ICD-10-CM

## 2016-11-11 DIAGNOSIS — F4323 Adjustment disorder with mixed anxiety and depressed mood: Secondary | ICD-10-CM

## 2016-11-11 DIAGNOSIS — M3509 Sicca syndrome with other organ involvement: Secondary | ICD-10-CM

## 2016-11-11 DIAGNOSIS — M25551 Pain in right hip: Secondary | ICD-10-CM | POA: Diagnosis not present

## 2016-11-11 DIAGNOSIS — M25552 Pain in left hip: Secondary | ICD-10-CM

## 2016-11-11 DIAGNOSIS — D72819 Decreased white blood cell count, unspecified: Secondary | ICD-10-CM | POA: Diagnosis not present

## 2016-11-11 MED ORDER — DULOXETINE HCL 30 MG PO CPEP: 30.0000 mg | ORAL_CAPSULE | Freq: Every day | ORAL | 3 refills | Status: DC
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
## 2016-11-11 NOTE — Assessment & Plan Note (Signed)
Recheck CBC, if still low send to hematology

## 2016-11-11 NOTE — Patient Instructions (Signed)
We will call with lab results  Get the xrays of your back and hips  F/U pending results

## 2016-11-11 NOTE — Assessment & Plan Note (Signed)
Restart cymbalta 30mg  daily

## 2016-11-12 ENCOUNTER — Ambulatory Visit (HOSPITAL_COMMUNITY)
Admission: RE | Admit: 2016-11-12 | Discharge: 2016-11-12 | Disposition: A | Discharge status: Home / Self Care | Payer: BLUE CROSS/BLUE SHIELD | Source: Ambulatory Visit | Attending: Family Medicine | Admitting: Family Medicine

## 2016-11-12 ENCOUNTER — Other Ambulatory Visit: Payer: Self-pay | Admitting: Family Medicine

## 2016-11-12 DIAGNOSIS — M25552 Pain in left hip: Secondary | ICD-10-CM | POA: Insufficient documentation

## 2016-11-12 DIAGNOSIS — Z1231 Encounter for screening mammogram for malignant neoplasm of breast: Secondary | ICD-10-CM

## 2016-11-12 DIAGNOSIS — M545 Low back pain, unspecified: Secondary | ICD-10-CM

## 2016-11-12 DIAGNOSIS — M25551 Pain in right hip: Secondary | ICD-10-CM | POA: Diagnosis not present

## 2016-11-12 DIAGNOSIS — M247 Protrusio acetabuli: Secondary | ICD-10-CM | POA: Insufficient documentation

## 2016-11-12 LAB — CBC WITH DIFFERENTIAL/PLATELET
Basophils Absolute: 38 cells/uL (ref 0–200)
Basophils Relative: 1 %
EOS PCT: 2 %
Eosinophils Absolute: 76 cells/uL (ref 15–500)
HEMATOCRIT: 38.2 % (ref 35.0–45.0)
HEMOGLOBIN: 12.5 g/dL (ref 12.0–15.0)
LYMPHS ABS: 2242 {cells}/uL (ref 850–3900)
Lymphocytes Relative: 59 %
MCH: 30.9 pg (ref 27.0–33.0)
MCHC: 32.7 g/dL (ref 32.0–36.0)
MCV: 94.3 fL (ref 80.0–100.0)
MONO ABS: 304 {cells}/uL (ref 200–950)
MPV: 9.6 fL (ref 7.5–12.5)
Monocytes Relative: 8 %
NEUTROS ABS: 1140 {cells}/uL — AB (ref 1500–7800)
NEUTROS PCT: 30 %
Platelets: 339 10*3/uL (ref 140–400)
RBC: 4.05 MIL/uL (ref 3.80–5.10)
RDW: 12.9 % (ref 11.0–15.0)
WBC: 3.8 10*3/uL (ref 3.8–10.8)

## 2016-11-13 ENCOUNTER — Other Ambulatory Visit: Payer: Self-pay | Admitting: *Deleted

## 2016-11-13 DIAGNOSIS — M1610 Unilateral primary osteoarthritis, unspecified hip: Secondary | ICD-10-CM

## 2016-11-20 DIAGNOSIS — R768 Other specified abnormal immunological findings in serum: Secondary | ICD-10-CM | POA: Insufficient documentation
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
## 2016-11-24 ENCOUNTER — Ambulatory Visit: Payer: BLUE CROSS/BLUE SHIELD | Admitting: Family Medicine

## 2016-11-25 ENCOUNTER — Ambulatory Visit (HOSPITAL_COMMUNITY): Payer: BLUE CROSS/BLUE SHIELD

## 2016-11-25 ENCOUNTER — Ambulatory Visit: Payer: BLUE CROSS/BLUE SHIELD | Admitting: Rheumatology

## 2016-11-30 ENCOUNTER — Encounter: Payer: Self-pay | Admitting: Family Medicine

## 2016-11-30 ENCOUNTER — Ambulatory Visit (INDEPENDENT_AMBULATORY_CARE_PROVIDER_SITE_OTHER): Payer: BLUE CROSS/BLUE SHIELD | Admitting: Family Medicine

## 2016-11-30 VITALS — BP 118/62 | HR 80 | Temp 98.1°F | Resp 14 | Ht 64.0 in | Wt 151.0 lb

## 2016-11-30 DIAGNOSIS — N760 Acute vaginitis: Secondary | ICD-10-CM | POA: Diagnosis not present

## 2016-11-30 DIAGNOSIS — B9689 Other specified bacterial agents as the cause of diseases classified elsewhere: Secondary | ICD-10-CM

## 2016-11-30 DIAGNOSIS — B373 Candidiasis of vulva and vagina: Secondary | ICD-10-CM

## 2016-11-30 DIAGNOSIS — B3731 Acute candidiasis of vulva and vagina: Secondary | ICD-10-CM

## 2016-11-30 LAB — WET PREP FOR TRICH, YEAST, CLUE
Clue Cells Wet Prep HPF POC: NONE SEEN
Trich, Wet Prep: NONE SEEN

## 2016-11-30 MED ORDER — FLUCONAZOLE 150 MG PO TABS: 150.0000 mg | ORAL_TABLET | Freq: Once | ORAL | 1 refills | Status: AC

## 2016-11-30 NOTE — Assessment & Plan Note (Signed)
Cleared, but has yeast infection

## 2016-11-30 NOTE — Progress Notes (Signed)
   Subjective:    [name], female    DOB: September 20, 1970, 46 y.o.   MRN: [id number]  Patient presents for Repeat Vaginal Culture (would like to be re-checked for BV) Pt is  here for repeat wet prep she was to make sure her bacterial vaginosis is clear and she's had this multiple times. She has minimal discharge no significant itching no abnormal bleeding. No UTI symptoms.    Review Of Systems:  GEN- denies fatigue, fever, weight loss,weakness, recent illness HEENT- denies eye drainage, change in vision, nasal discharge, CVS- denies chest pain, palpitations RESP- denies SOB, cough, wheeze ABD- denies N/V, change in stools, abd pain GU- denies dysuria, hematuria, dribbling, incontinence       Objective:    BP 118/62   Pulse 80   Temp 98.1 F (36.7 C) (Oral)   Resp 14   Ht 5\' 4"  (1.626 m)   Wt 151 lb (68.5 kg)   SpO2 98%   BMI 25.92 kg/m  GEN- NAD, alert and oriented x3 GU- normal external genitalia, vaginal mucosa pink and moist, cervix visualized no growth, no blood form os, minimal thin clear discharge,  no ovarian masses, uterus absent        Assessment & Plan:      Problem List Items Addressed This Visit    BV (bacterial vaginosis) - Primary    Cleared, but has yeast infection      Relevant Medications   fluconazole (DIFLUCAN) 150 MG tablet   Other Relevant Orders   WET PREP FOR [place], YEAST, CLUE (Completed)    Other Visit Diagnoses    Yeast infection of the vagina       Treat with diflucan   Relevant Medications   fluconazole (DIFLUCAN) 150 MG tablet      Note: This dictation was prepared with Dragon dictation along with smaller phrase technology. Any transcriptional errors that result from this process are unintentional.

## 2016-12-03 ENCOUNTER — Ambulatory Visit (HOSPITAL_COMMUNITY)
Admission: RE | Admit: 2016-12-03 | Discharge: 2016-12-03 | Disposition: A | Discharge status: Home / Self Care | Payer: BLUE CROSS/BLUE SHIELD | Source: Ambulatory Visit | Attending: Family Medicine | Admitting: Family Medicine

## 2016-12-03 ENCOUNTER — Encounter (HOSPITAL_COMMUNITY): Payer: Self-pay

## 2016-12-03 DIAGNOSIS — Z1231 Encounter for screening mammogram for malignant neoplasm of breast: Secondary | ICD-10-CM | POA: Diagnosis not present

## 2016-12-08 ENCOUNTER — Ambulatory Visit: Payer: BLUE CROSS/BLUE SHIELD | Admitting: Gastroenterology

## 2016-12-16 ENCOUNTER — Encounter: Payer: Self-pay | Admitting: Family Medicine

## 2016-12-21 ENCOUNTER — Encounter: Payer: Self-pay | Admitting: Orthopedic Surgery

## 2016-12-21 ENCOUNTER — Ambulatory Visit (INDEPENDENT_AMBULATORY_CARE_PROVIDER_SITE_OTHER): Payer: BLUE CROSS/BLUE SHIELD | Admitting: Orthopedic Surgery

## 2016-12-21 VITALS — BP 104/67 | HR 60 | Wt 152.0 lb

## 2016-12-21 DIAGNOSIS — M160 Bilateral primary osteoarthritis of hip: Secondary | ICD-10-CM | POA: Diagnosis not present

## 2016-12-21 DIAGNOSIS — G8929 Other chronic pain: Secondary | ICD-10-CM | POA: Diagnosis not present

## 2016-12-21 DIAGNOSIS — M545 Low back pain: Secondary | ICD-10-CM

## 2016-12-21 NOTE — Progress Notes (Signed)
NEW PATIENT OFFICE VISIT    Chief Complaint  Patient presents with  . Hip Pain    right hip pain    46 year old female presents with a one-year history of mild to moderate dull aching lower back pain worse on the right than the left primarily in the right buttock nonradiating constant worsening despite taking ibuprofen.  She was sent for lumbar spine and x-rays of the hips and the x-rays showed mild arthritis in both hips  She does not have any groin or anterior thigh pain    Review of Systems  Constitutional: Negative for chills, fever and weight loss.  Respiratory: Negative for shortness of breath.   Cardiovascular: Negative for chest pain.  Neurological: Negative for tingling.     Past Medical History:  Diagnosis Date  . Abdominal pain 06/12/2014  . Allergy   . Anemia    iron 1 yr ago, normal hgb 7/12  . Anxiety   . Asthma    pt says no-bronchitis  . Bloated abdomen 10/15/2015  . Chronic back pain   . Chronic headaches   . Chronic neck pain   . Chronic sinusitis   . Current use of estrogen therapy 04/16/2015  . Dyspareunia 09/12/2014  . Elevated TSH 04/16/2015  . Fatigue 04/10/2015  . Fibromyalgia   . Gas 10/15/2015  . GERD (gastroesophageal reflux disease)   . Herpes simplex virus (HSV) infection   . Hot flashes 04/10/2015  . Hyperlipidemia   . Kidney stone on right side 10/24/2015  . Mental disorder   . Ovarian cyst, right   . Paresthesias   . Shortness of breath   . Sinus drainage   . Sjoegren syndrome ([place])   . Symptoms, such as flushing, sleeplessness, headache, lack of concentration, associated with the menopause 04/10/2015  . TMJ (dislocation of temporomandibular joint)   . Vaginal discharge 08/08/2015  . Vertigo   . Yeast infection 10/15/2015    Past Surgical History:  Procedure Laterality Date  . ABDOMINAL HYSTERECTOMY    . BIOPSY  09/09/2016   Procedure: BIOPSY;  Surgeon: Daneil Dolin, MD;  [facility];  Service: Endoscopy;;   esophageal  . BREAST CYST EXCISION Right 1980   benign  . CESAREAN SECTION     x2  . CHOLECYSTECTOMY  2005   cholelithiasis  . COLONOSCOPY N/A 09/09/2016   Procedure: COLONOSCOPY;  Surgeon: Daneil Dolin, MD;  [facility];  Service: Endoscopy;  Laterality: N/A;  8:30am  . ESOPHAGOGASTRODUODENOSCOPY  01/23/2011   LPF:[id number] undulating Z-line vs short segment Barrett s/p bx (NOT BARRETT's)/small HH otherwise normal  . ESOPHAGOGASTRODUODENOSCOPY N/A 09/09/2016   Procedure: ESOPHAGOGASTRODUODENOSCOPY (EGD);  Surgeon: Daneil Dolin, MD;  [facility];  Service: Endoscopy;  Laterality: N/A;  Venia Minks DILATION  01/23/2011   Procedure: Venia Minks DILATION;  Surgeon: Daneil Dolin, MD;  [facility];  Service: Endoscopy;  Laterality: N/A;  . Venia Minks DILATION N/A 09/09/2016   Procedure: Venia Minks DILATION;  Surgeon: Daneil Dolin, MD;  [facility];  Service: Endoscopy;  Laterality: N/A;  . OVARIAN CYST REMOVAL  92 removal of cysts from behind ovaries  . PARTIAL HYSTERECTOMY  93  . SEPTOPLASTY     2011  . TEMPOROMANDIBULAR JOINT SURGERY    . TONSILLECTOMY     2010  . TURBINATE RESECTION Bilateral 05/29/2013   Procedure: TURBINATE RESECTION;  Surgeon: Ascencion Dike, MD;  Location: [place];  Service: ENT;  Laterality:  Bilateral;  . UPPER GASTROINTESTINAL ENDOSCOPY      Family History  Problem Relation Age of Onset  . Hypertension Mother   . Thyroid disease Sister        overactive  . Bronchitis Son   . Heart disease Maternal Grandmother   . Hypertension Maternal Grandmother   . Alcohol abuse Maternal Grandfather   . Scoliosis Son   . Other Maternal Aunt        Sjeogren syndrome  . Other Maternal Aunt        Sjoegren syndrome  . Other Maternal Aunt        Sjoegren syndrome  . Anesthesia problems Neg Hx   . Malignant hyperthermia Neg Hx   . Hypotension Neg Hx   . Pseudochol deficiency Neg Hx    Social History  Substance  Use Topics  . Smoking status: Never Smoker  . Smokeless tobacco: Never Used  . Alcohol use No    BP 104/67   Pulse 60   Wt 152 lb (68.9 kg)   BMI 26.09 kg/m   Physical Exam  Constitutional: She is oriented to person, place, and time. She appears well-developed and well-nourished.  Cardiovascular: Intact distal pulses.   Neurological: She is alert and oriented to person, place, and time. She displays no atrophy and no tremor. No sensory deficit. She exhibits normal muscle tone. Gait normal. She displays no Babinski's sign on the right side. She displays no Babinski's sign on the left side.  Psychiatric: She has a normal mood and affect.  Vitals reviewed.   Left Hip Exam   Tenderness  None  Range of Motion  Normal left hip ROM  Muscle Strength  Normal left hip strength  Tests  [place]: Negative  Right Hip Exam   Tenderness  None  Range of Motion  Normal right hip ROM  Muscle Strength  Normal right hip strength  Tests  [place]: Negative  Back Exam   Tenderness  The patient is experiencing tenderness in the lumbar, sacroiliac tenderness.  Reflexes  Patellar:  Normal Achilles:  Normal    Encounter Diagnoses  Name Primary?  . Bilateral hip joint arthritis   . Chronic midline low back pain without sciatica Yes   I was able to get a copy of her lumbar spine films which looked normal. I also saw her pelvic film in her [facility] and lateral right and left hip film and they both show mild joint space narrowing symmetric  Most of her pain is in her lower back. She does not have any radicular symptoms. She does have a history of fibromyalgia most of her pain is musculoskeletal and soft tissue in nature  PLAN:  Recommend continue NSAIDs for pain relief. Physical therapy. No follow-up has been scheduled. No surgery is needed.

## 2016-12-21 NOTE — Patient Instructions (Addendum)

## 2016-12-23 ENCOUNTER — Telehealth (HOSPITAL_COMMUNITY): Payer: Self-pay

## 2016-12-23 ENCOUNTER — Ambulatory Visit (HOSPITAL_COMMUNITY): Payer: BLUE CROSS/BLUE SHIELD

## 2016-12-23 NOTE — Telephone Encounter (Signed)
Patient is unable to make it today °

## 2016-12-23 NOTE — Telephone Encounter (Signed)
Patient canceled her eval  on 12-23-16 and 12-24-16 appt was changed to a eval

## 2016-12-24 ENCOUNTER — Telehealth (HOSPITAL_COMMUNITY): Payer: Self-pay | Admitting: Family Medicine

## 2016-12-24 ENCOUNTER — Ambulatory Visit (HOSPITAL_COMMUNITY): Payer: BLUE CROSS/BLUE SHIELD

## 2016-12-24 ENCOUNTER — Telehealth (HOSPITAL_COMMUNITY): Payer: Self-pay | Admitting: Physical Therapy

## 2016-12-24 NOTE — Telephone Encounter (Signed)
12/24/16  pt left a message to cx because she has to go to work

## 2016-12-24 NOTE — Telephone Encounter (Signed)
She called back this afternoon and wanted to be reschedule for this afternoon at 2:30 or for Friday at 2:30pm. There was timie on the schedule that the patient wanted. NF

## 2016-12-29 ENCOUNTER — Ambulatory Visit (HOSPITAL_COMMUNITY): Payer: BLUE CROSS/BLUE SHIELD | Admitting: Physical Therapy

## 2016-12-29 ENCOUNTER — Telehealth (HOSPITAL_COMMUNITY): Payer: Self-pay | Admitting: Physical Therapy

## 2016-12-29 NOTE — Telephone Encounter (Signed)
Offered pt a 3:15 pm apptment if she is available. Yvette Mayer

## 2016-12-30 ENCOUNTER — Ambulatory Visit (HOSPITAL_COMMUNITY): Payer: BLUE CROSS/BLUE SHIELD | Attending: Orthopedic Surgery | Admitting: Physical Therapy

## 2016-12-30 DIAGNOSIS — M6281 Muscle weakness (generalized): Secondary | ICD-10-CM

## 2016-12-30 DIAGNOSIS — G8929 Other chronic pain: Secondary | ICD-10-CM | POA: Diagnosis not present

## 2016-12-30 DIAGNOSIS — R0981 Nasal congestion: Secondary | ICD-10-CM | POA: Diagnosis not present

## 2016-12-30 DIAGNOSIS — M545 Low back pain, unspecified: Secondary | ICD-10-CM

## 2016-12-30 DIAGNOSIS — R29898 Other symptoms and signs involving the musculoskeletal system: Secondary | ICD-10-CM | POA: Insufficient documentation

## 2016-12-30 DIAGNOSIS — H6983 Other specified disorders of Eustachian tube, bilateral: Secondary | ICD-10-CM | POA: Diagnosis not present
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
## 2016-12-30 NOTE — Patient Instructions (Signed)
   SINGLE KNEE TO CHEST STRETCH - Yvette Mayer  While Lying on your back, hold your knee and gently pull it up towards your chest.  Hold for 10 seconds.  Repeat 5 times each side, twice a day.    DOUBLE KNEE TO CHEST STRETCH - DKTC  While Lying on your back,  hold your knees and gently pull them up towards your chest.  Hold for 10 seconds.  Repeat 5 times, twice a day.    Lumbar Rotations   Lying on your back with your knees bent, slowly drop your legs to one side and hold the stretch. Come back to the middle and switch sides. You should feel the stretch in your back on the opposite side that your legs are leaning.  Hold for 10 seconds then relax.   Repeat 5 times each side, twice a day.     BRIDGING  While lying on your back, tighten your lower abdominals, squeeze your buttocks and then raise your buttocks off the floor/bed as creating a "Bridge" with your body.  Repeat 10-15 times, twice a day.

## 2017-01-04 ENCOUNTER — Telehealth (HOSPITAL_COMMUNITY): Payer: Self-pay | Admitting: Family Medicine

## 2017-01-04 NOTE — Telephone Encounter (Signed)
01/04/17  pt left a message to cancel this week she just said that she couldn't make it this week

## 2017-01-05 ENCOUNTER — Ambulatory Visit (HOSPITAL_COMMUNITY): Payer: BLUE CROSS/BLUE SHIELD

## 2017-01-07 ENCOUNTER — Ambulatory Visit (HOSPITAL_COMMUNITY): Payer: BLUE CROSS/BLUE SHIELD

## 2017-01-11 ENCOUNTER — Ambulatory Visit: Payer: BLUE CROSS/BLUE SHIELD | Admitting: Family Medicine

## 2017-01-12 ENCOUNTER — Ambulatory Visit (HOSPITAL_COMMUNITY): Payer: BLUE CROSS/BLUE SHIELD

## 2017-01-12 NOTE — Telephone Encounter (Signed)
No show, called and spoke to pt who stated she forgot about the apt today and requested to cancel rest of apts scheduled for this week, stated she will call back to reschedule.    [address], [place]; [facility] [phone]

## 2017-01-14 ENCOUNTER — Ambulatory Visit (HOSPITAL_COMMUNITY): Payer: BLUE CROSS/BLUE SHIELD

## 2017-01-14 ENCOUNTER — Telehealth (HOSPITAL_COMMUNITY): Payer: Self-pay

## 2017-01-14 NOTE — Telephone Encounter (Signed)
No show, called and left message concerning missed apt.  Included next apt date and time with contact information given.    Euell Schiff, LPTA; [facility] [phone] 

## 2017-01-19 ENCOUNTER — Ambulatory Visit (INDEPENDENT_AMBULATORY_CARE_PROVIDER_SITE_OTHER): Payer: BLUE CROSS/BLUE SHIELD | Admitting: Adult Health

## 2017-01-19 ENCOUNTER — Encounter (HOSPITAL_COMMUNITY): Payer: Self-pay | Admitting: Physical Therapy

## 2017-01-19 ENCOUNTER — Ambulatory Visit (HOSPITAL_COMMUNITY): Payer: BLUE CROSS/BLUE SHIELD | Admitting: Physical Therapy

## 2017-01-19 ENCOUNTER — Telehealth (HOSPITAL_COMMUNITY): Payer: Self-pay | Admitting: Physical Therapy

## 2017-01-19 ENCOUNTER — Encounter: Payer: Self-pay | Admitting: Adult Health

## 2017-01-19 VITALS — BP 110/60 | HR 62 | Ht 65.0 in | Wt 153.0 lb

## 2017-01-19 DIAGNOSIS — N898 Other specified noninflammatory disorders of vagina: Secondary | ICD-10-CM | POA: Diagnosis not present

## 2017-01-19 DIAGNOSIS — N760 Acute vaginitis: Secondary | ICD-10-CM

## 2017-01-19 DIAGNOSIS — B9689 Other specified bacterial agents as the cause of diseases classified elsewhere: Secondary | ICD-10-CM

## 2017-01-19 MED ORDER — METRONIDAZOLE 500 MG PO TABS: 500.0000 mg | ORAL_TABLET | Freq: Two times a day (BID) | ORAL | 1 refills | Status: DC

## 2017-01-19 MED ORDER — FLUCONAZOLE 150 MG PO TABS: ORAL_TABLET | ORAL | 1 refills | Status: DC

## 2017-01-19 NOTE — Progress Notes (Signed)
Subjective:     [name], female   DOB: 1971/05/01, 46 y.o.   MRN: [id number]  HPI [place] is a 46 year old black female, recently divorced, in complaining of vaginal discharge, and left hip hurts.   Review of Systems Vaginal discharge Left hip hurts, has seen orthopedic, has arthritis Reviewed past medical,surgical, social and family history. Reviewed medications and allergies.     Objective:   Physical Exam BP 110/60 (BP Location: Left Arm, Patient Position: Sitting, Cuff Size: Normal)   Pulse 62   Ht 5\' 5"  (1.651 m)   Wt 153 lb (69.4 kg)   BMI 25.46 kg/m  Skin warm and dry.Pelvic: external genitalia is normal in appearance no lesions, vagina: white discharge with slight odor,urethra has no lesions or masses noted, cervix and uterus are absent, adnexa: no masses or tenderness noted. Bladder is non tender and no masses felt. Wet prep: + for clue cells and +WBCs.     Assessment:     1. Vaginal discharge   2. BV (bacterial vaginosis)       Plan:    Rx flagyl 500 mg 1 bid x 7 days, no alcohol, review handout on BV,with 1 refill Rx diflucan 150 mg #2 take 1 now and repeat 1 in 3 days, with 1 refill Follow up prn

## 2017-01-19 NOTE — Therapy (Signed)
[facility] [place], [place], [address] Phone: [phone]   Fax:  [phone]  Patient Details  Name: Yvette Mayer MRN: [id number] Date of Birth: 1970/12/13 Referring Provider:  No ref. provider found  Encounter Date: 01/19/2017   PHYSICAL THERAPY DISCHARGE SUMMARY  Visits from Start of Care: 1  Current functional level related to goals / functional outcomes: Patient has had 3 consecutive no-shows; DC today per policy.    Remaining deficits: Unable to assess    Education / Equipment: N/A  Plan: Patient agrees to discharge.  Patient goals were not met. Patient is being discharged due to not returning since the last visit.  ?????      Deniece Ree PT, DPT [facility] [address] [place], [place], [address] Phone: [phone]   Fax:  [phone]

## 2017-01-19 NOTE — Telephone Encounter (Signed)
Patient had her 3rd consecutive no-show today; called and left voicemail indicating this being her 3rd no show as well as DC today per [facility] policy, she will need new MD order to continue with [place], DPT [phone]

## 2017-01-19 NOTE — Patient Instructions (Signed)
Bacterial Vaginosis Bacterial vaginosis is a vaginal infection that occurs when the normal balance of bacteria in the vagina is disrupted. It results from an overgrowth of certain bacteria. This is the most common vaginal infection among women ages 15-44. Because bacterial vaginosis increases your risk for STIs (sexually transmitted infections), getting treated can help reduce your risk for chlamydia, gonorrhea, herpes, and HIV (human immunodeficiency virus). Treatment is also important for preventing complications in pregnant women, because this condition can cause an early (premature) delivery. What are the causes? This condition is caused by an increase in harmful bacteria that are normally present in small amounts in the vagina. However, the reason that the condition develops is not fully understood. What increases the risk? The following factors may make you more likely to develop this condition:  Having a new sexual partner or multiple sexual partners.  Having unprotected sex.  Douching.  Having an intrauterine device (IUD).  Smoking.  Drug and alcohol abuse.  Taking certain antibiotic medicines.  Being pregnant.  You cannot get bacterial vaginosis from toilet seats, bedding, swimming pools, or contact with objects around you. What are the signs or symptoms? Symptoms of this condition include:  Grey or white vaginal discharge. The discharge can also be watery or foamy.  A fish-like odor with discharge, especially after sexual intercourse or during menstruation.  Itching in and around the vagina.  Burning or pain with urination.  Some women with bacterial vaginosis have no signs or symptoms. How is this diagnosed? This condition is diagnosed based on:  Your medical history.  A physical exam of the vagina.  Testing a sample of vaginal fluid under a microscope to look for a large amount of bad bacteria or abnormal cells. Your health care provider may use a cotton swab  or a small wooden spatula to collect the sample.  How is this treated? This condition is treated with antibiotics. These may be given as a pill, a vaginal cream, or a medicine that is put into the vagina (suppository). If the condition comes back after treatment, a second round of antibiotics may be needed. Follow these instructions at home: Medicines  Take over-the-counter and prescription medicines only as told by your health care provider.  Take or use your antibiotic as told by your health care provider. Do not stop taking or using the antibiotic even if you start to feel better. General instructions  If you have a female sexual partner, tell her that you have a vaginal infection. She should see her health care provider and be treated if she has symptoms. If you have a female sexual partner, he does not need treatment.  During treatment: ? Avoid sexual activity until you finish treatment. ? Do not douche. ? Avoid alcohol as directed by your health care provider. ? Avoid breastfeeding as directed by your health care provider.  Drink enough water and fluids to keep your urine clear or pale yellow.  Keep the area around your vagina and rectum clean. ? Wash the area daily with warm water. ? Wipe yourself from front to back after using the toilet.  Keep all follow-up visits as told by your health care provider. This is important. How is this prevented?  Do not douche.  Wash the outside of your vagina with warm water only.  Use protection when having sex. This includes latex condoms and dental dams.  Limit how many sexual partners you have. To help prevent bacterial vaginosis, it is best to have sex with just   one partner (monogamous).  Make sure you and your sexual partner are tested for STIs.  Wear cotton or cotton-lined underwear.  Avoid wearing tight pants and pantyhose, especially during summer.  Limit the amount of alcohol that you drink.  Do not use any products that  contain nicotine or tobacco, such as cigarettes and e-cigarettes. If you need help quitting, ask your health care provider.  Do not use illegal drugs. Where to find more information:  Centers for Disease Control and Prevention: www.cdc.gov/std  American Sexual Health Association (ASHA): www.ashastd.org  U.S. Department of Health and Human Services, Office on Women's Health: www.womenshealth.gov/ or https://www.womenshealth.gov/a-z-topics/bacterial-vaginosis Contact a health care provider if:  Your symptoms do not improve, even after treatment.  You have more discharge or pain when urinating.  You have a fever.  You have pain in your abdomen.  You have pain during sex.  You have vaginal bleeding between periods. Summary  Bacterial vaginosis is a vaginal infection that occurs when the normal balance of bacteria in the vagina is disrupted.  Because bacterial vaginosis increases your risk for STIs (sexually transmitted infections), getting treated can help reduce your risk for chlamydia, gonorrhea, herpes, and HIV (human immunodeficiency virus). Treatment is also important for preventing complications in pregnant women, because the condition can cause an early (premature) delivery.  This condition is treated with antibiotic medicines. These may be given as a pill, a vaginal cream, or a medicine that is put into the vagina (suppository). This information is not intended to replace advice given to you by your health care provider. Make sure you discuss any questions you have with your health care provider. Document Released: 05/18/2005 Document Revised: 02/01/2016 Document Reviewed: 02/01/2016 Elsevier Interactive Patient Education  2017 [employer]  

## 2017-01-21 ENCOUNTER — Ambulatory Visit (HOSPITAL_COMMUNITY): Payer: BLUE CROSS/BLUE SHIELD | Admitting: Physical Therapy

## 2017-01-27 ENCOUNTER — Ambulatory Visit (INDEPENDENT_AMBULATORY_CARE_PROVIDER_SITE_OTHER): Payer: BLUE CROSS/BLUE SHIELD | Admitting: Nurse Practitioner

## 2017-01-27 ENCOUNTER — Other Ambulatory Visit: Payer: Self-pay | Admitting: *Deleted

## 2017-01-27 ENCOUNTER — Encounter: Payer: Self-pay | Admitting: Nurse Practitioner

## 2017-01-27 VITALS — BP 98/64 | HR 59 | Temp 97.1°F | Ht 65.0 in | Wt 150.8 lb

## 2017-01-27 DIAGNOSIS — L290 Pruritus ani: Secondary | ICD-10-CM

## 2017-01-27 DIAGNOSIS — K5900 Constipation, unspecified: Secondary | ICD-10-CM

## 2017-01-27 MED ORDER — VALACYCLOVIR HCL 1 G PO TABS: 1000.0000 mg | ORAL_TABLET | Freq: Two times a day (BID) | ORAL | 0 refills | Status: DC

## 2017-01-27 MED ORDER — LINACLOTIDE 290 MCG PO CAPS
290.0000 ug | ORAL_CAPSULE | Freq: Every day | ORAL | 0 refills | Status: DC
Start: 2017-01-27 — End: 2017-06-02

## 2017-01-27 NOTE — Progress Notes (Signed)
Referring Provider: Alycia Rossetti, MD Primary Care Physician:  Alycia Rossetti, MD Primary GI:  Dr. Gala Romney  Chief Complaint  Patient presents with  . Constipation  . Abdominal Pain  . Anal Itching    wants to be checked for parasites    HPI:   Yvette Mayer is a 46 y.o. female who presents Dysphagia, GERD, constipation, abdominal pain. The patient was last seen in our office 08/03/2016 for constipation, GERD, dysphagia. EGD completed in 2012 with accentuated undulation Z line noted, biopsy negative for Barrett's, small hiatal hernia, esophageal dilation. Diagnosed with Sjogren syndrome in 2013. At her last visit she complained of heartburn especially with meals, pain in the epigastric region, dysphagia. Admits she has some missing teeth and this could be complicating her issues. Also complained of constipation going to up to a week without a bowel movement and is tried multiple over-the-counter agents without relief. Saw a ": Therapist" and had her colon cleaned out but felt like this made things worse. Recently started Linzess 145 daily and having more frequent stools. Jodi Mourning better if she uses Pepcid every day. No other GI symptoms. She was recommended to have a colonoscopy due to her ethnicity indicating first-ever screening colonoscopy at age 42.  EGD completed 09/09/2016 which found abnormal distal esophagus square short segment Barrett's status post biopsy and status post Maloney dilation, small hiatal hernia. Recommended stop Pepcid, begin Protonix 40 mg daily. Surgical pathology found the biopsies to be benign gastric mucosa with slight chronic irritation, negative for dysplasia, hyperplasia, malignancy. Colonoscopy completed the same day found moderate diverticulosis in the entire examined colon, internal hemorrhoids, otherwise normal. Recommend continue Linzess daily, add Benefiber 1 tablespoon twice a day, repeat colonoscopy in 10 years for screening purposes.  Today she  states she's doing ok. States she used to have a colon cleanse. Has abdominal pain with CT scans unremarkable. Is having rectal itching and thinks she may have parasites and feels she has a lot of symptoms of parasites (based on Internet reading.) Abdominal pain is lower abdomen, described as crampy, constant, has bloating and excessive flatus. No change in intensity of her abdominal pain. Nothing makes it better (including pain medication) and nothing makes it better. Has a bowel movement about once a day. Some days consistent with bristol 4, but lately has been more consistent with [facility] 2, requires straining. Denies hematochezia, melena, fever, chills, unintentional weight loss (has had about 8 lb intentional weight loss with dietary changes). Denies chest pain, dyspnea, dizziness, lightheadedness, syncope, near syncope. Denies any other upper or lower GI symptoms.  Past Medical History:  Diagnosis Date  . Abdominal pain 06/12/2014  . Allergy   . Anemia    iron 1 yr ago, normal hgb 7/12  . Anxiety   . Asthma    pt says no-bronchitis  . Bloated abdomen 10/15/2015  . Chronic back pain   . Chronic headaches   . Chronic neck pain   . Chronic sinusitis   . Current use of estrogen therapy 04/16/2015  . Dyspareunia 09/12/2014  . Elevated TSH 04/16/2015  . Fatigue 04/10/2015  . Fibromyalgia   . Gas 10/15/2015  . GERD (gastroesophageal reflux disease)   . Herpes simplex virus (HSV) infection   . Hot flashes 04/10/2015  . Hyperlipidemia   . Kidney stone on right side 10/24/2015  . Mental disorder   . Ovarian cyst, right   . Paresthesias   . Shortness of breath   . Sinus drainage   .  Sjoegren syndrome ([place])   . Symptoms, such as flushing, sleeplessness, headache, lack of concentration, associated with the menopause 04/10/2015  . TMJ (dislocation of temporomandibular joint)   . Vaginal discharge 08/08/2015  . Vertigo   . Yeast infection 10/15/2015    Past Surgical History:  Procedure  Laterality Date  . ABDOMINAL HYSTERECTOMY    . BIOPSY  09/09/2016   Procedure: BIOPSY;  Surgeon: Daneil Dolin, MD;  Location: [facility];  Service: Endoscopy;;  esophageal  . BREAST CYST EXCISION Right 1980   benign  . CESAREAN SECTION     x2  . CHOLECYSTECTOMY  2005   cholelithiasis  . COLONOSCOPY N/A 09/09/2016   Procedure: COLONOSCOPY;  Surgeon: Daneil Dolin, MD;  Location: [facility];  Service: Endoscopy;  Laterality: N/A;  8:30am  . ESOPHAGOGASTRODUODENOSCOPY  01/23/2011   NWG:NFAOZHYQMVH undulating Z-line vs short segment Barrett s/p bx (NOT BARRETT's)/small HH otherwise normal  . ESOPHAGOGASTRODUODENOSCOPY N/A 09/09/2016   Procedure: ESOPHAGOGASTRODUODENOSCOPY (EGD);  Surgeon: Daneil Dolin, MD;  Location: [facility];  Service: Endoscopy;  Laterality: N/A;  Venia Minks DILATION  01/23/2011   Procedure: Venia Minks DILATION;  Surgeon: Daneil Dolin, MD;  [facility]: [facility];  Service: Endoscopy;  Laterality: N/A;  . Venia Minks DILATION N/A 09/09/2016   Procedure: Venia Minks DILATION;  Surgeon: Daneil Dolin, MD;  Location: [facility];  Service: Endoscopy;  Laterality: N/A;  . OVARIAN CYST REMOVAL  92 removal of cysts from behind ovaries  . PARTIAL HYSTERECTOMY  93  . SEPTOPLASTY     2011  . TEMPOROMANDIBULAR JOINT SURGERY    . TONSILLECTOMY     2010  . TURBINATE RESECTION Bilateral 05/29/2013   Procedure: TURBINATE RESECTION;  Surgeon: Ascencion Dike, MD;  Location: [place];  Service: ENT;  Laterality: Bilateral;  . UPPER GASTROINTESTINAL ENDOSCOPY      Current Outpatient Prescriptions  Medication Sig Dispense Refill  . cyclobenzaprine (FLEXERIL) 10 MG tablet Take 5 mg by mouth as needed.     . pantoprazole (PROTONIX) 40 MG tablet Take 40 mg by mouth as needed.     . Polyvinyl Alcohol-Povidone (REFRESH OP) Apply to eye every 3 (three) hours.    . RESTASIS 0.05 % ophthalmic emulsion Place 1 drop into both eyes 2 (two) times daily.      No current  facility-administered medications for this visit.     Allergies as of 01/27/2017 - Review Complete 01/27/2017  Allergen Reaction Noted  . Hydrocodone-acetaminophen Itching 01/13/2011  . Percocet [oxycodone-acetaminophen] Itching 01/13/2011  . Plaquenil [hydroxychloroquine sulfate] Rash 04/16/2015    Family History  Problem Relation Age of Onset  . Hypertension Mother   . Thyroid disease Sister        overactive  . Bronchitis Son   . Heart disease Maternal Grandmother   . Hypertension Maternal Grandmother   . Alcohol abuse Maternal Grandfather   . Scoliosis Son   . Other Maternal Aunt        Sjeogren syndrome  . Other Maternal Aunt        Sjoegren syndrome  . Other Maternal Aunt        Sjoegren syndrome  . Anesthesia problems Neg Hx   . Malignant hyperthermia Neg Hx   . Hypotension Neg Hx   . Pseudochol deficiency Neg Hx   . Colon cancer Neg Hx     Social History   Social History  . Marital status: Legally Separated    Spouse name:  N/A  . Number of children: 3  . Years of education: N/A   Occupational History  . [employer]   Social History Main Topics  . Smoking status: Never Smoker  . Smokeless tobacco: Never Used  . Alcohol use No  . Drug use: No  . Sexual activity: Not Currently    Birth control/ protection: Surgical     Comment: hyst   Other Topics Concern  . None   Social History Narrative  . None    Review of Systems: General: Negative for anorexia, weight loss, fever, chills, fatigue. ENT: Negative for hoarseness, difficulty swallowing. CV: Negative for chest pain, angina, palpitations, peripheral edema.  Respiratory: Negative for dyspnea at rest, cough, sputum, wheezing.  GI: See history of present illness. Psych: Admits significant stress.  Endo: Negative for unusual weight change.  Heme: Negative for bruising or bleeding.   Physical Exam: BP 98/64   Pulse (!) 59   Temp (!) 97.1 F (36.2 C) (Oral)   Ht 5\' 5"  (1.651 m)    Wt 150 lb 12.8 oz (68.4 kg)   BMI 25.09 kg/m  General:   Alert and oriented. Pleasant and cooperative. Well-nourished and well-developed.  Eyes:  Without icterus, sclera clear and conjunctiva pink.  Ears:  Normal auditory acuity. Cardiovascular:  S1, S2 present without murmurs appreciated. Extremities without clubbing or edema. Respiratory:  Clear to auscultation bilaterally. No wheezes, rales, or rhonchi. No distress.  Gastrointestinal:  +BS, soft, and non-distended. Mild soreness to palpation lower abdomen. No HSM noted. No guarding or rebound. Rectal:  Deferred  Musculoskalatal:  Symmetrical without gross deformities. Neurologic:  Alert and oriented x4;  grossly normal neurologically. Psych:  Alert and cooperative. Normal mood and affect. Heme/Lymph/Immune: No excessive bruising noted.    01/27/2017 4:05 PM   Disclaimer: This note was dictated with voice recognition software. Similar sounding words can inadvertently be transcribed and may not be corrected upon review.

## 2017-01-27 NOTE — Assessment & Plan Note (Signed)
The patient states she has rectal itching and would like to be tested for parasites. She states she has read a lot about parasites and worms and think she has a lot of the same symptoms. At this point I'm not opposed to stool for O&P if this will help her impression of her symptoms. She could also have hemorrhoids given her constipation causing rectal itching. If her O&P is negative and she continues to have rectal symptoms we can try over-the-counter hemorrhoid options such as Preparation H or Anusol steroid cream. Return for follow-up in 3 months.

## 2017-01-27 NOTE — Telephone Encounter (Signed)
Received call from patient.   Requested refill on Valtrex.  Prescription sent to pharmacy.

## 2017-01-27 NOTE — Patient Instructions (Signed)
1. Start taking Linzess 290 g once a day, on an empty stomach. 2. I am giving you samples to last 2 weeks. 3. Call us in 1-2 weeks and let us know if your constipation and abdominal pain is getting any better on Linzess. 4. We'll provide you with stool cups to collect a sample to bring to the lab to check for ova and parasites. 5. Return for follow-up in 3 months. 6. Call us before then if you have any questions or concerns.

## 2017-01-27 NOTE — Assessment & Plan Note (Signed)
The patient admits significant constipation and abdominal pain. Her symptoms are worsened by stress. She is previously tried Linzess 145 g and this initially helped, but no longer did. She is no longer taking anything for constipation. At this point I will trial her on an increased dose of Linzess at 290 g daily. Her symptoms are likely due to irritable bowel syndrome. I will provide samples for 2 weeks and request a progress report in 1-2 weeks. If it is not effective for her and we can try another option such as Amitiza or Trulance.

## 2017-01-28 ENCOUNTER — Encounter: Payer: Self-pay | Admitting: Nurse Practitioner

## 2017-01-28 NOTE — Progress Notes (Signed)
cc'ed to pcp °

## 2017-02-02 DIAGNOSIS — K5900 Constipation, unspecified: Secondary | ICD-10-CM | POA: Diagnosis not present

## 2017-02-02 DIAGNOSIS — L290 Pruritus ani: Secondary | ICD-10-CM | POA: Diagnosis not present

## 2017-02-03 ENCOUNTER — Ambulatory Visit: Payer: BLUE CROSS/BLUE SHIELD | Admitting: Adult Health

## 2017-02-03 LAB — OVA AND PARASITE EXAMINATION: OP: NONE SEEN

## 2017-02-09 ENCOUNTER — Telehealth: Payer: Self-pay | Admitting: Internal Medicine

## 2017-02-09 NOTE — Telephone Encounter (Signed)
PLEASE CALL PATIENT IF THE RESULTS TO HER LABS ARE BACK

## 2017-02-09 NOTE — Telephone Encounter (Signed)
Routing to EG 

## 2017-02-10 NOTE — Telephone Encounter (Signed)
See result note.  

## 2017-02-23 ENCOUNTER — Ambulatory Visit: Payer: BLUE CROSS/BLUE SHIELD | Admitting: Adult Health

## 2017-03-04 ENCOUNTER — Ambulatory Visit: Payer: BLUE CROSS/BLUE SHIELD | Admitting: Adult Health

## 2017-03-11 ENCOUNTER — Ambulatory Visit (INDEPENDENT_AMBULATORY_CARE_PROVIDER_SITE_OTHER): Payer: BLUE CROSS/BLUE SHIELD | Admitting: Adult Health

## 2017-03-11 ENCOUNTER — Encounter: Payer: Self-pay | Admitting: Adult Health

## 2017-03-11 VITALS — BP 100/60 | HR 82 | Ht 65.0 in | Wt 155.0 lb

## 2017-03-11 DIAGNOSIS — N816 Rectocele: Secondary | ICD-10-CM | POA: Diagnosis not present

## 2017-03-11 DIAGNOSIS — K5900 Constipation, unspecified: Secondary | ICD-10-CM

## 2017-03-11 NOTE — Patient Instructions (Signed)
Pelvic Organ Prolapse Pelvic organ prolapse is the stretching, bulging, or dropping of pelvic organs into an abnormal position. It happens when the muscles and tissues that surround and support pelvic structures are stretched or weak. Pelvic organ prolapse can involve:  Vagina (vaginal prolapse).  Uterus (uterine prolapse).  Bladder (cystocele).  Rectum (rectocele).  Intestines (enterocele).  When organs other than the vagina are involved, they often bulge into the vagina or protrude from the vagina, depending on how severe the prolapse is. What are the causes? Causes of this condition include:  Pregnancy, labor, and childbirth.  Long-lasting (chronic) cough.  Chronic constipation.  Obesity.  Past pelvic surgery.  Aging. During and after menopause, a decreased production of the hormone estrogen can weaken pelvic ligaments and muscles.  Consistently lifting more than 50 lb (23 kg).  Buildup of fluid in the abdomen due to certain diseases and other conditions.  What are the signs or symptoms? Symptoms of this condition include:  Loss of bladder control when you cough, sneeze, strain, and exercise (stress incontinence). This may be worse immediately following childbirth, and it may gradually improve over time.  Feeling pressure in your pelvis or vagina. This pressure may increase when you cough or when you are having a bowel movement.  A bulge that protrudes from the opening of your vagina or against your vaginal wall. If your uterus protrudes through the opening of your vagina and rubs against your clothing, you may also experience soreness, ulcers, infection, pain, and bleeding.  Increased effort to have a bowel movement or urinate.  Pain in your low back.  Pain, discomfort, or disinterest in sexual intercourse.  Repeated bladder infections (urinary tract infections).  Difficulty inserting or inability to insert a tampon or applicator.  In some people, this  condition does not cause any symptoms. How is this diagnosed? Your health care provider may perform an internal and external vaginal and rectal exam. During the exam, you may be asked to cough and strain while you are lying down, sitting, and standing up. Your health care provider will determine if other tests are required, such as bladder function tests. How is this treated? In most cases, this condition needs to be treated only if it produces symptoms. No treatment is guaranteed to correct the prolapse or relieve the symptoms completely. Treatment may include:  Lifestyle changes, such as: ? Avoiding drinking beverages that contain caffeine. ? Increasing your intake of high-fiber foods. This can help to decrease constipation and straining during bowel movements. ? Emptying your bladder at scheduled times (bladder training therapy). This can help to reduce or avoid urinary incontinence. ? Losing weight if you are overweight or obese.  Estrogen. Estrogen may help mild prolapse by increasing the strength and tone of pelvic floor muscles.  Kegel exercises. These may help mild cases of prolapse by strengthening and tightening the muscles of the pelvic floor.  Pessary insertion. A pessary is a soft, flexible device that is placed into your vagina by your health care provider to help support the vaginal walls and keep pelvic organs in place.  Surgery. This is often the only form of treatment for severe prolapse. Different types of surgeries are available.  Follow these instructions at home:  Wear a sanitary pad or absorbent product if you have urinary incontinence.  Avoid heavy lifting and straining with exercise and work. Do not hold your breath when you perform mild to moderate lifting and exercise activities. Limit your activities as directed by your health care   provider.  Take medicines only as directed by your health care provider.  Perform Kegel exercises as directed by your health care  provider.  If you have a pessary, take care of it as directed by your health care provider. Contact a health care provider if:  Your symptoms interfere with your daily activities or sex life.  You need medicine to help with the discomfort.  You notice bleeding from the vagina that is not related to your period.  You have a fever.  You have pain or bleeding when you urinate.  You have bleeding when you have a bowel movement.  You lose urine when you have sex.  You have chronic constipation.  You have a pessary that falls out.  You have vaginal discharge that has a bad smell.  You have low abdominal pain or cramping that is unusual for you. This information is not intended to replace advice given to you by your health care provider. Make sure you discuss any questions you have with your health care provider. Document Released: 12/13/2013 Document Revised: 10/24/2015 Document Reviewed: 07/31/2013 Elsevier Interactive Patient Education  2018 [employer]  

## 2017-03-11 NOTE — Progress Notes (Signed)
Subjective:     [name], female   DOB: December 17, 1970, 46 y.o.   MRN: [id number]  HPI Yvette Mayer is a 46 year old black female, divorced,in wanting to discuss her rectocele.Has some constipation, has stopped linzess and has discomfort with sex at times.She stresses over things.   Review of Systems +constipation  +discomfort with sex at times  Reviewed past medical,surgical, social and family history. Reviewed medications and allergies.     Objective:   Physical Exam BP 100/60 (BP Location: Left Arm, Patient Position: Sitting, Cuff Size: Small)   Pulse 82   Ht 5\' 5"  (1.651 m)   Wt 155 lb (70.3 kg)   BMI 25.79 kg/m    Talk only.Discussed that should resume linzess and that will get her an appointment with MD to assess to see if surgical intervention necessary. She declined exam today.  Assessment:     1. Rectocele   2. Constipation, unspecified constipation type       Plan:     Resume taking linzess, daily or at least every other day Follow up in 2 weeks with Dr Elonda Husky to discuss rectocele  Review handout on rectocele

## 2017-03-15 ENCOUNTER — Ambulatory Visit: Payer: BLUE CROSS/BLUE SHIELD | Admitting: Allergy

## 2017-03-26 ENCOUNTER — Ambulatory Visit: Payer: BLUE CROSS/BLUE SHIELD | Admitting: Obstetrics & Gynecology

## 2017-04-05 ENCOUNTER — Ambulatory Visit: Payer: BLUE CROSS/BLUE SHIELD | Admitting: Obstetrics & Gynecology

## 2017-04-08 ENCOUNTER — Other Ambulatory Visit: Payer: Self-pay | Admitting: *Deleted

## 2017-04-08 MED ORDER — VALACYCLOVIR HCL 1 G PO TABS: 1000.0000 mg | ORAL_TABLET | Freq: Two times a day (BID) | ORAL | 0 refills | Status: DC

## 2017-04-19 ENCOUNTER — Ambulatory Visit: Payer: BLUE CROSS/BLUE SHIELD | Admitting: Obstetrics & Gynecology

## 2017-04-29 ENCOUNTER — Ambulatory Visit: Payer: BLUE CROSS/BLUE SHIELD | Admitting: Obstetrics & Gynecology

## 2017-05-04 ENCOUNTER — Ambulatory Visit: Payer: BLUE CROSS/BLUE SHIELD | Admitting: Family Medicine

## 2017-05-06 ENCOUNTER — Ambulatory Visit: Payer: BLUE CROSS/BLUE SHIELD | Admitting: Nurse Practitioner

## 2017-05-17 ENCOUNTER — Ambulatory Visit: Payer: BLUE CROSS/BLUE SHIELD | Admitting: Obstetrics & Gynecology

## 2017-05-19 DIAGNOSIS — J343 Hypertrophy of nasal turbinates: Secondary | ICD-10-CM | POA: Diagnosis not present

## 2017-05-19 DIAGNOSIS — J324 Chronic pansinusitis: Secondary | ICD-10-CM | POA: Diagnosis not present

## 2017-05-19 DIAGNOSIS — J3089 Other allergic rhinitis: Secondary | ICD-10-CM | POA: Diagnosis not present

## 2017-05-19 DIAGNOSIS — H6983 Other specified disorders of Eustachian tube, bilateral: Secondary | ICD-10-CM | POA: Diagnosis not present

## 2017-05-28 ENCOUNTER — Encounter (HOSPITAL_COMMUNITY): Payer: Self-pay | Admitting: Emergency Medicine

## 2017-05-28 ENCOUNTER — Other Ambulatory Visit: Payer: Self-pay

## 2017-05-28 ENCOUNTER — Emergency Department (HOSPITAL_COMMUNITY)
Admission: EM | Admit: 2017-05-28 | Discharge: 2017-05-28 | Disposition: A | Discharge status: Home / Self Care | Payer: BLUE CROSS/BLUE SHIELD | Attending: Emergency Medicine | Admitting: Emergency Medicine

## 2017-05-28 DIAGNOSIS — R1084 Generalized abdominal pain: Secondary | ICD-10-CM | POA: Insufficient documentation

## 2017-05-28 DIAGNOSIS — M549 Dorsalgia, unspecified: Secondary | ICD-10-CM | POA: Diagnosis not present

## 2017-05-28 DIAGNOSIS — R6883 Chills (without fever): Secondary | ICD-10-CM | POA: Diagnosis not present

## 2017-05-28 DIAGNOSIS — J45909 Unspecified asthma, uncomplicated: Secondary | ICD-10-CM | POA: Insufficient documentation

## 2017-05-28 DIAGNOSIS — Z79899 Other long term (current) drug therapy: Secondary | ICD-10-CM | POA: Diagnosis not present

## 2017-05-28 DIAGNOSIS — N3090 Cystitis, unspecified without hematuria: Secondary | ICD-10-CM | POA: Diagnosis not present

## 2017-05-28 DIAGNOSIS — R300 Dysuria: Secondary | ICD-10-CM | POA: Diagnosis not present

## 2017-05-28 DIAGNOSIS — R350 Frequency of micturition: Secondary | ICD-10-CM | POA: Diagnosis not present

## 2017-05-28 LAB — CBC WITH DIFFERENTIAL/PLATELET
BASOS ABS: 0.1 10*3/uL (ref 0.0–0.1)
Basophils Relative: 1 %
EOS ABS: 0.2 10*3/uL (ref 0.0–0.7)
EOS PCT: 4 %
HCT: 39.8 % (ref 36.0–46.0)
HEMOGLOBIN: 13.1 g/dL (ref 12.0–15.0)
LYMPHS ABS: 2 10*3/uL (ref 0.7–4.0)
LYMPHS PCT: 50 %
MCH: 30.6 pg (ref 26.0–34.0)
MCHC: 32.9 g/dL (ref 30.0–36.0)
MCV: 93 fL (ref 78.0–100.0)
Monocytes Absolute: 0.3 10*3/uL (ref 0.1–1.0)
Monocytes Relative: 7 %
NEUTROS PCT: 38 %
Neutro Abs: 1.5 10*3/uL — ABNORMAL LOW (ref 1.7–7.7)
PLATELETS: 299 10*3/uL (ref 150–400)
RBC: 4.28 MIL/uL (ref 3.87–5.11)
RDW: 12.4 % (ref 11.5–15.5)
WBC: 4 10*3/uL (ref 4.0–10.5)

## 2017-05-28 LAB — URINALYSIS, ROUTINE W REFLEX MICROSCOPIC
Bacteria, UA: NONE SEEN
Bilirubin Urine: NEGATIVE
GLUCOSE, UA: NEGATIVE mg/dL
KETONES UR: NEGATIVE mg/dL
NITRITE: NEGATIVE
PH: 5 (ref 5.0–8.0)
PROTEIN: NEGATIVE mg/dL
Specific Gravity, Urine: 1.017 (ref 1.005–1.030)

## 2017-05-28 LAB — COMPREHENSIVE METABOLIC PANEL
ALT: 17 U/L (ref 14–54)
AST: 24 U/L (ref 15–41)
Albumin: 3.7 g/dL (ref 3.5–5.0)
Alkaline Phosphatase: 78 U/L (ref 38–126)
Anion gap: 9 (ref 5–15)
BILIRUBIN TOTAL: 0.5 mg/dL (ref 0.3–1.2)
BUN: 18 mg/dL (ref 6–20)
CHLORIDE: 105 mmol/L (ref 101–111)
CO2: 27 mmol/L (ref 22–32)
CREATININE: 0.74 mg/dL (ref 0.44–1.00)
Calcium: 9.3 mg/dL (ref 8.9–10.3)
Glucose, Bld: 100 mg/dL — ABNORMAL HIGH (ref 65–99)
POTASSIUM: 3.8 mmol/L (ref 3.5–5.1)
Sodium: 141 mmol/L (ref 135–145)
TOTAL PROTEIN: 7.5 g/dL (ref 6.5–8.1)

## 2017-05-28 LAB — LIPASE, BLOOD: LIPASE: 46 U/L (ref 11–51)

## 2017-05-28 MED ORDER — CEPHALEXIN 500 MG PO CAPS: 500.0000 mg | ORAL_CAPSULE | Freq: Two times a day (BID) | ORAL | 0 refills | Status: DC
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
## 2017-05-28 NOTE — ED Triage Notes (Signed)
Pt c/o dysuria, urinary frequency, lower abd/back pain x 2 days.

## 2017-06-02 ENCOUNTER — Encounter: Payer: Self-pay | Admitting: Family Medicine

## 2017-06-02 ENCOUNTER — Ambulatory Visit: Payer: BLUE CROSS/BLUE SHIELD | Admitting: Family Medicine

## 2017-06-02 ENCOUNTER — Other Ambulatory Visit: Payer: Self-pay

## 2017-06-02 VITALS — BP 106/64 | HR 82 | Temp 97.9°F | Resp 16 | Ht 65.0 in | Wt 162.0 lb

## 2017-06-02 DIAGNOSIS — M797 Fibromyalgia: Secondary | ICD-10-CM | POA: Diagnosis not present

## 2017-06-02 DIAGNOSIS — M79671 Pain in right foot: Secondary | ICD-10-CM

## 2017-06-02 DIAGNOSIS — N3000 Acute cystitis without hematuria: Secondary | ICD-10-CM | POA: Diagnosis not present

## 2017-06-02 DIAGNOSIS — E782 Mixed hyperlipidemia: Secondary | ICD-10-CM | POA: Diagnosis not present

## 2017-06-02 DIAGNOSIS — M3509 Sicca syndrome with other organ involvement: Secondary | ICD-10-CM | POA: Diagnosis not present

## 2017-06-02 NOTE — Progress Notes (Signed)
   Subjective:    [name], female    DOB: 04-22-1971, 47 y.o.   MRN: [id number]  Patient presents for Follow-up (is not fasting- had labs from job in 03/2017); R Foot Pain (states that she has throbbing in foot- thinks it's nerve pain); and B Ear Pain  Is here to follow-up chronic medical problems. Hyperlipidemia  had increased LDL on her lipids at her physical exam with her GYN in the spring  No Current medications was treated with dietary changes. She had recent labs done at her Job in Oct. TC was 180 LDL 98.   Sjorgen's syndrome- no new issues, continues on eye drops   GERD/Chronic constipation managed by GI, on PPI , she stopped Linzess  She was seen at the emergency room last week secondary to UTI symptoms.  Her labs were unremarkable white blood cells were normal she was treated with Keflex  Bilateral ear pain- following with ENT, has seen multiple specalist over the years, Has been given multiple meds has not helped, she wants to have tube placed to have it drained. Has follow up next week- Dr. Janace Hoard  She is stressed because she has suffered so long with this  Right foot pain- pain for past month, feels a tightness around mid foot, no tingling or numbness, pain started after wearing high heels, also wears steel toe boots on her job . Gets throbbing on occasion   Review Of Systems:  GEN- denies fatigue, fever, weight loss,weakness, recent illness HEENT- denies eye drainage, change in vision, nasal discharge, CVS- denies chest pain, palpitations RESP- denies SOB, cough, wheeze ABD- denies N/V, change in stools, abd pain GU- denies dysuria, hematuria, dribbling, incontinence MSK- +joint pain, muscle aches, injury Neuro- denies headache, dizziness, syncope, seizure activity       Objective:    BP 106/64   Pulse 82   Temp 97.9 F (36.6 C) (Oral)   Resp 16   Ht 5\' 5"  (1.651 m)   Wt 162 lb (73.5 kg)   SpO2 98%   BMI 26.96 kg/m  GEN- NAD, alert and  oriented x3 HEENT- PERRL, EOMI, non injected sclera, pink conjunctiva, MMM, oropharynx clear, TM clear bilat no effusion Neck- Supple, no thyromegaly CVS- RRR, no murmur RESP-CTAB ABD-NABS,soft,NT,ND, No CVA tenderness Psych- normal affect and mood EXT- No edema  MSK- FROM Ankle,foot, no visible bony abnormality, mild TTP mid foot, no cyst or nodule palpated, normal weight bearing , non antalgic gait  Pulses- Radial, DP- 2+        Assessment & Plan:      Problem List Items Addressed This Visit      Unprioritized   Fibromyalgia   Sjoegren syndrome ([place]) - Primary    Continues to opthalomogy, on drops      Hyperlipidemia    Recent lipids, hyperlupidemia resolved now       Other Visit Diagnoses    Acute cystitis without hematuria       symptoms resolved complete antibiotics, reviewed ER note   Right foot pain       Mid foot pain from type of shoes, though OA underlying could be possible, no sign of fracture, normal weight bearing, try insoles, epson salt, discussed footwea      Note: This dictation was prepared with Dragon dictation along with smaller phrase technology. Any transcriptional errors that result from this process are unintentional.

## 2017-06-02 NOTE — Patient Instructions (Addendum)
F/U 6 months PHYSICAL  

## 2017-06-02 NOTE — Assessment & Plan Note (Signed)
Continues to opthalomogy, on drops

## 2017-06-02 NOTE — Assessment & Plan Note (Signed)
Recent lipids, hyperlupidemia resolved now

## 2017-06-11 ENCOUNTER — Ambulatory Visit: Payer: BLUE CROSS/BLUE SHIELD | Admitting: Obstetrics & Gynecology

## 2017-06-23 ENCOUNTER — Ambulatory Visit: Payer: BLUE CROSS/BLUE SHIELD | Admitting: Family Medicine

## 2017-06-25 ENCOUNTER — Other Ambulatory Visit: Payer: Self-pay

## 2017-06-25 ENCOUNTER — Ambulatory Visit: Payer: BLUE CROSS/BLUE SHIELD | Admitting: Family Medicine

## 2017-06-25 ENCOUNTER — Encounter: Payer: Self-pay | Admitting: Family Medicine

## 2017-06-25 VITALS — BP 110/60 | HR 62 | Temp 98.0°F | Resp 16 | Ht 65.0 in | Wt 161.0 lb

## 2017-06-25 DIAGNOSIS — N898 Other specified noninflammatory disorders of vagina: Secondary | ICD-10-CM | POA: Diagnosis not present

## 2017-06-25 DIAGNOSIS — B9689 Other specified bacterial agents as the cause of diseases classified elsewhere: Secondary | ICD-10-CM

## 2017-06-25 DIAGNOSIS — N760 Acute vaginitis: Secondary | ICD-10-CM | POA: Diagnosis not present

## 2017-06-25 DIAGNOSIS — R809 Proteinuria, unspecified: Secondary | ICD-10-CM

## 2017-06-25 DIAGNOSIS — R300 Dysuria: Secondary | ICD-10-CM | POA: Diagnosis not present

## 2017-06-25 LAB — MICROSCOPIC MESSAGE

## 2017-06-25 LAB — URINALYSIS, ROUTINE W REFLEX MICROSCOPIC
BILIRUBIN URINE: NEGATIVE
Bacteria, UA: NONE SEEN /HPF
GLUCOSE, UA: NEGATIVE
Leukocytes, UA: NEGATIVE
NITRITE: NEGATIVE
PH: 7 (ref 5.0–8.0)
Specific Gravity, Urine: 1.02 (ref 1.001–1.03)
WBC, UA: NONE SEEN /HPF (ref 0–5)

## 2017-06-25 LAB — BASIC METABOLIC PANEL
BUN: 12 mg/dL (ref 7–25)
CALCIUM: 9 mg/dL (ref 8.6–10.2)
CHLORIDE: 106 mmol/L (ref 98–110)
CO2: 28 mmol/L (ref 20–32)
Creat: 0.84 mg/dL (ref 0.50–1.10)
Glucose, Bld: 84 mg/dL (ref 65–99)
POTASSIUM: 4.7 mmol/L (ref 3.5–5.3)
SODIUM: 142 mmol/L (ref 135–146)

## 2017-06-25 LAB — EXTRA LAV TOP TUBE

## 2017-06-25 LAB — WET PREP FOR TRICH, YEAST, CLUE

## 2017-06-25 MED ORDER — FLUCONAZOLE 150 MG PO TABS: 150.0000 mg | ORAL_TABLET | Freq: Once | ORAL | 1 refills | Status: AC

## 2017-06-25 NOTE — Patient Instructions (Addendum)
Take Flagyl you have at home  Diflucan  F/U as needed

## 2017-06-25 NOTE — Progress Notes (Signed)
   Subjective:    [name], female    DOB: Feb 20, 1971, 47 y.o.   MRN: [id number]  Patient presents for Vaginal Irritation (x1 week- thick white discharge, itching)  Pt here with discharge for the past week. Was on antibiotics from ER at end of December.  s/p hysterectomy, no vaginal bleeding  No abdominal pain,no fever, no N/V Still occasional burning with urination    Review Of Systems:  GEN- denies fatigue, fever, weight loss,weakness, recent illness HEENT- denies eye drainage, change in vision, nasal discharge, CVS- denies chest pain, palpitations RESP- denies SOB, cough, wheeze ABD- denies N/V, change in stools, abd pain GU- denies dysuria, hematuria, dribbling, incontinence MSK- denies joint pain, muscle aches, injury Neuro- denies headache, dizziness, syncope, seizure activity       Objective:    BP 110/60   Pulse 62   Temp 98 F (36.7 C) (Oral)   Resp 16   Ht 5\' 5"  (1.651 m)   Wt 161 lb (73 kg)   SpO2 99%   BMI 26.79 kg/m  GEN- NAD, alert and oriented x3 ABD-NABS,soft,mild TTP suprapubic region,ND, no CVA tenderness  GU- normal external genitalia, vaginal mucosa pink and moist, Uterus absent, , no ovarian masses, + white discharge, palpable rectocele         Assessment & Plan:      Problem List Items Addressed This Visit    None    Visit Diagnoses    Dysuria    -  Primary   No sign of UTI, but ketones and some protein, more water, check renal function due to the protein   Relevant Orders   Urinalysis, Routine w reflex microscopic (Completed)   BV (bacterial vaginosis)       Recurrent BV, she has flagyl at home, treat for 7 days, has f/u GYN soon for rectocele advised to discuss the recurrent BV with them   Relevant Medications   fluconazole (DIFLUCAN) 150 MG tablet   Other Relevant Orders   WET PREP FOR [place], YEAST, CLUE (Completed)   C. trachomatis/N. gonorrhoeae RNA   Proteinuria, unspecified type       Relevant Orders   Basic  metabolic panel      Note: This dictation was prepared with Dragon dictation along with smaller phrase technology. Any transcriptional errors that result from this process are unintentional.

## 2017-06-26 LAB — C. TRACHOMATIS/N. GONORRHOEAE RNA
C. TRACHOMATIS RNA, TMA: NOT DETECTED
N. gonorrhoeae RNA, TMA: NOT DETECTED

## 2017-06-29 ENCOUNTER — Ambulatory Visit: Payer: BLUE CROSS/BLUE SHIELD | Admitting: Obstetrics & Gynecology

## 2017-06-29 ENCOUNTER — Other Ambulatory Visit: Payer: Self-pay

## 2017-06-29 ENCOUNTER — Encounter (INDEPENDENT_AMBULATORY_CARE_PROVIDER_SITE_OTHER): Payer: Self-pay

## 2017-06-29 ENCOUNTER — Encounter: Payer: Self-pay | Admitting: Obstetrics & Gynecology

## 2017-06-29 VITALS — BP 100/58 | HR 85 | Ht 65.0 in | Wt 160.8 lb

## 2017-06-29 DIAGNOSIS — N952 Postmenopausal atrophic vaginitis: Secondary | ICD-10-CM

## 2017-06-29 MED ORDER — ESTRADIOL 0.1 MG/24HR TD PTTW: 1.0000 | MEDICATED_PATCH | TRANSDERMAL | 12 refills | Status: DC

## 2017-06-29 MED ORDER — ESTROGENS, CONJUGATED 0.625 MG/GM VA CREA: 1.0000 | TOPICAL_CREAM | Freq: Every day | VAGINAL | 11 refills | Status: DC

## 2017-06-29 NOTE — Progress Notes (Signed)
Chief Complaint  Patient presents with  . Follow-up    rectocele      47 y.o. [id number] No LMP recorded. Patient has had a hysterectomy. The current method of family planning is status post hysterectomy.  Outpatient Encounter Medications as of 06/29/2017  Medication Sig Note  . metroNIDAZOLE (FLAGYL) 500 MG tablet Take 500 mg by mouth 3 (three) times daily.   . pantoprazole (PROTONIX) 40 MG tablet Take 40 mg by mouth as needed.    . Polyvinyl Alcohol-Povidone (REFRESH OP) Apply to eye every 3 (three) hours.   . RESTASIS 0.05 % ophthalmic emulsion Place 1 drop into both eyes 2 (two) times daily.    Marland Kitchen conjugated estrogens (PREMARIN) vaginal cream Place 1 Applicatorful vaginally daily.   Derrill Memo ON 07/01/2017] estradiol (VIVELLE-DOT) 0.1 MG/24HR patch Place 1 patch (0.1 mg total) onto the skin 2 (two) times a week.   . valACYclovir (VALTREX) 1000 MG tablet Take 1 tablet (1,000 mg total) 2 (two) times daily by mouth. (Patient not taking: Reported on 06/29/2017) 06/02/2017: PRN  . [DISCONTINUED] cyclobenzaprine (FLEXERIL) 10 MG tablet Take 5 mg by mouth as needed.     No facility-administered encounter medications on file as of 06/29/2017.     Subjective Yvette Mayer is in for evaluation of a rectocoele Has a history of constipation over the past couple of years Does have some discomfort with insertion with intercourse Does not have to splint Also has recurrent BV for the past year or so Uses linzess which improves the constipation  Past Medical History:  Diagnosis Date  . Abdominal pain 06/12/2014  . Allergy   . Anemia    iron 1 yr ago, normal hgb 7/12  . Anxiety   . Asthma    pt says no-bronchitis  . Bloated abdomen 10/15/2015  . Chronic back pain   . Chronic headaches   . Chronic neck pain   . Chronic sinusitis   . Current use of estrogen therapy 04/16/2015  . Dyspareunia 09/12/2014  . Elevated TSH 04/16/2015  . Fatigue 04/10/2015  . Fibromyalgia   . Gas 10/15/2015  .  GERD (gastroesophageal reflux disease)   . Herpes simplex virus (HSV) infection   . Hot flashes 04/10/2015  . Hyperlipidemia   . Kidney stone on right side 10/24/2015  . Mental disorder   . Ovarian cyst, right   . Paresthesias   . Shortness of breath   . Sinus drainage   . Sjoegren syndrome ([place])   . Symptoms, such as flushing, sleeplessness, headache, lack of concentration, associated with the menopause 04/10/2015  . TMJ (dislocation of temporomandibular joint)   . Vaginal discharge 08/08/2015  . Vertigo   . Yeast infection 10/15/2015    Past Surgical History:  Procedure Laterality Date  . ABDOMINAL HYSTERECTOMY    . BIOPSY  09/09/2016   Procedure: BIOPSY;  Surgeon: Daneil Dolin, MD;  [facility];  Service: Endoscopy;;  esophageal  . BREAST CYST EXCISION Right 1980   benign  . CESAREAN SECTION     x2  . CHOLECYSTECTOMY  2005   cholelithiasis  . COLONOSCOPY N/A 09/09/2016   Procedure: COLONOSCOPY;  Surgeon: Daneil Dolin, MD;  [facility];  Service: Endoscopy;  Laterality: N/A;  8:30am  . ESOPHAGOGASTRODUODENOSCOPY  01/23/2011   VZD:GLOVFIEPPIR undulating Z-line vs short segment Barrett s/p bx (NOT BARRETT's)/small HH otherwise normal  . ESOPHAGOGASTRODUODENOSCOPY N/A 09/09/2016   Procedure: ESOPHAGOGASTRODUODENOSCOPY (EGD);  Surgeon: Cristopher Estimable  Rourk, MD;  [facility];  Service: Endoscopy;  Laterality: N/A;  Venia Minks DILATION  01/23/2011   Procedure: Venia Minks DILATION;  Surgeon: Daneil Dolin, MD;  [facility];  Service: Endoscopy;  Laterality: N/A;  . Venia Minks DILATION N/A 09/09/2016   Procedure: Venia Minks DILATION;  Surgeon: Daneil Dolin, MD;  [facility];  Service: Endoscopy;  Laterality: N/A;  . OVARIAN CYST REMOVAL  92 removal of cysts from behind ovaries  . PARTIAL HYSTERECTOMY  93  . SEPTOPLASTY     2011  . TEMPOROMANDIBULAR JOINT SURGERY    . TONSILLECTOMY     2010  . TURBINATE RESECTION Bilateral 05/29/2013    Procedure: TURBINATE RESECTION;  Surgeon: Ascencion Dike, MD;  Location: [place];  Service: ENT;  Laterality: Bilateral;  . UPPER GASTROINTESTINAL ENDOSCOPY      OB History    Gravida Para Term Preterm AB Living   4 3 2 1 1 3    SAB TAB Ectopic Multiple Live Births   1       3      Allergies  Allergen Reactions  . Hydrocodone-Acetaminophen Itching  . Percocet [Oxycodone-Acetaminophen] Itching  . Plaquenil [Hydroxychloroquine Sulfate] Rash    Social History   Socioeconomic History  . Marital status: Legally Separated    Spouse name: None  . Number of children: 3  . Years of education: None  . Highest education level: None  Social Needs  . Financial resource strain: None  . Food insecurity - worry: None  . Food insecurity - inability: None  . Transportation needs - medical: None  . Transportation needs - non-medical: None  Occupational History  . Occupation: Packer Transport planner: [employer]  Tobacco Use  . Smoking status: Never Smoker  . Smokeless tobacco: Never Used  Substance and Sexual Activity  . Alcohol use: No  . Drug use: No  . Sexual activity: Not Currently    Birth control/protection: Surgical    Comment: hyst  Other Topics Concern  . None  Social History Narrative  . None    Family History  Problem Relation Age of Onset  . Hypertension Mother   . Thyroid disease Sister        overactive  . Bronchitis Son   . Heart disease Maternal Grandmother   . Hypertension Maternal Grandmother   . Alcohol abuse Maternal Grandfather   . Scoliosis Son   . Other Maternal Aunt        Sjeogren syndrome  . Other Maternal Aunt        Sjoegren syndrome  . Other Maternal Aunt        Sjoegren syndrome  . Anesthesia problems Neg Hx   . Malignant hyperthermia Neg Hx   . Hypotension Neg Hx   . Pseudochol deficiency Neg Hx   . Colon cancer Neg Hx     Medications:       Current Outpatient Medications:  .  metroNIDAZOLE (FLAGYL) 500 MG tablet,  Take 500 mg by mouth 3 (three) times daily., Disp: , Rfl:  .  pantoprazole (PROTONIX) 40 MG tablet, Take 40 mg by mouth as needed. , Disp: , Rfl:  .  Polyvinyl Alcohol-Povidone (REFRESH OP), Apply to eye every 3 (three) hours., Disp: , Rfl:  .  RESTASIS 0.05 % ophthalmic emulsion, Place 1 drop into both eyes 2 (two) times daily. , Disp: , Rfl:  .  conjugated estrogens (PREMARIN) vaginal cream, Place 1  Applicatorful vaginally daily., Disp: 30 g, Rfl: 11 .  [START ON 07/01/2017] estradiol (VIVELLE-DOT) 0.1 MG/24HR patch, Place 1 patch (0.1 mg total) onto the skin 2 (two) times a week., Disp: 8 patch, Rfl: 12 .  valACYclovir (VALTREX) 1000 MG tablet, Take 1 tablet (1,000 mg total) 2 (two) times daily by mouth. (Patient not taking: Reported on 06/29/2017), Disp: 20 tablet, Rfl: 0  Objective Blood pressure (!) 100/58, pulse 85, height 5\' 5"  (1.651 m), weight 160 lb 12.8 oz (72.9 kg).  General WDWN female NAD Vulva:  normal appearing vulva with no masses, tenderness or lesions Vagina:  atrophic, no discharge, no  Lesions, no rectocoele is noted today good muscle tone Cervix:  absent Uterus:  uterus absent Adnexa: ovaries:present,  normal adnexa in size, nontender and no masses   Pertinent ROS No burning with urination, frequency or urgency No nausea, vomiting or diarrhea Nor fever chills or other constitutional symptoms   Labs or studies Reviewed previous labs, [facility] 2016 72    Impression Diagnoses this Encounter::   ICD-10-CM   1. Vaginal atrophy N95.2   2. 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 estradiol (VIVELLE-DOT) 0.1 MG/24HR patch    Sig: Place 1 patch (0.1 mg total) onto the skin 2 (two) times a week.    Dispense:  8 patch    Refill:  12  . conjugated estrogens (PREMARIN) vaginal cream    Sig: Place 1 Applicatorful vaginally daily.    Dispense:  30 g    Refill:   11    Labs or Scans Ordered: No orders of the defined types were placed in this encounter.   Management:: Topical Systemic estrogen + vaginal estrogen Reassess in 3 months  Follow up No Follow-up on file.      All questions were answered.

## 2017-06-30 ENCOUNTER — Ambulatory Visit: Payer: BLUE CROSS/BLUE SHIELD | Admitting: Nurse Practitioner

## 2017-07-01 ENCOUNTER — Telehealth: Payer: Self-pay | Admitting: Obstetrics & Gynecology

## 2017-07-01 NOTE — Telephone Encounter (Signed)
LMOVM to draw up a quarter of an applicatorful 7.2W of premarin. Advised to call if further questions.

## 2017-07-12 ENCOUNTER — Ambulatory Visit: Payer: BLUE CROSS/BLUE SHIELD | Admitting: Adult Health

## 2017-07-16 ENCOUNTER — Ambulatory Visit: Payer: BLUE CROSS/BLUE SHIELD | Admitting: Family Medicine

## 2017-07-16 ENCOUNTER — Encounter: Payer: Self-pay | Admitting: Family Medicine

## 2017-07-16 VITALS — BP 108/68 | HR 62 | Temp 97.9°F | Resp 16 | Ht 65.0 in | Wt 160.4 lb

## 2017-07-16 DIAGNOSIS — N898 Other specified noninflammatory disorders of vagina: Secondary | ICD-10-CM | POA: Diagnosis not present

## 2017-07-16 LAB — WET PREP FOR TRICH, YEAST, CLUE

## 2017-07-16 NOTE — Progress Notes (Signed)
   Subjective:    [name], female    DOB: [birth year], 47 y.o.   MRN: [id number]  Patient presents for Vaginitis (irritation since using estrogen cream)  Pt here with vaginal irritation, started after using a vaginal estrogen cream given by GYN last week for atrophic vaginitis . Denies any bleeding, just intense itching, no discharge. She used the cream only 1 day then symptoms started, she then used monistat thinking it may have been yeast infection Still feels irritated Also given estrogen patch but has not started that yet    Review Of Systems:  GEN- denies fatigue, fever, weight loss,weakness, recent illness ABD- denies N/V, change in stools, abd pain GU- denies dysuria, hematuria, dribbling, incontinence        Objective:    BP 108/68   Pulse 62   Temp 97.9 F (36.6 C)   Resp 16   Ht 5\' 5"  (1.651 m)   Wt 160 lb 6.4 oz (72.8 kg)   SpO2 98%   BMI 26.69 kg/m  GEN- NAD, alert and oriented x3 GU- normal external genitalia, erythema of labia minora/ lower part of labia majora, white discharge noted in vault vaginal mucosa pink and moist, Uterus absent, , no ovarian masses,          Assessment & Plan:      Problem List Items Addressed This Visit    None    Visit Diagnoses    Vaginal irritation    -  Primary   Wet prep no yeast, this looks more contact irritation,, I dont think she has active BV as no symptoms. Will use vagisil or hydrocortisone topically, call her GYN   Relevant Orders   WET PREP FOR [place], YEAST, CLUE      Note: This dictation was prepared with Dragon dictation along with smaller phrase technology. Any transcriptional errors that result from this process are unintentional.

## 2017-07-16 NOTE — Patient Instructions (Addendum)
F/U as previous Try Vagisil or hydrocortisone 1% for irritation Call your GYN

## 2017-07-20 ENCOUNTER — Ambulatory Visit: Payer: BLUE CROSS/BLUE SHIELD | Admitting: Adult Health

## 2017-07-29 ENCOUNTER — Ambulatory Visit: Payer: BLUE CROSS/BLUE SHIELD | Admitting: Adult Health

## 2017-07-29 ENCOUNTER — Encounter: Payer: Self-pay | Admitting: Adult Health

## 2017-07-29 ENCOUNTER — Other Ambulatory Visit: Payer: BLUE CROSS/BLUE SHIELD

## 2017-07-29 VITALS — BP 100/70 | HR 98 | Ht 64.0 in | Wt 158.0 lb

## 2017-07-29 DIAGNOSIS — N952 Postmenopausal atrophic vaginitis: Secondary | ICD-10-CM

## 2017-07-29 DIAGNOSIS — R809 Proteinuria, unspecified: Secondary | ICD-10-CM

## 2017-07-29 DIAGNOSIS — N898 Other specified noninflammatory disorders of vagina: Secondary | ICD-10-CM | POA: Diagnosis not present

## 2017-07-29 DIAGNOSIS — B9689 Other specified bacterial agents as the cause of diseases classified elsewhere: Secondary | ICD-10-CM

## 2017-07-29 DIAGNOSIS — N760 Acute vaginitis: Secondary | ICD-10-CM

## 2017-07-29 LAB — POCT WET PREP (WET MOUNT)
CLUE CELLS WET PREP WHIFF POC: NEGATIVE
WBC WET PREP: POSITIVE

## 2017-07-29 LAB — URINALYSIS, ROUTINE W REFLEX MICROSCOPIC
Bilirubin Urine: NEGATIVE
GLUCOSE, UA: NEGATIVE
Nitrite: NEGATIVE
Specific Gravity, Urine: 1.025 (ref 1.001–1.03)
pH: 5.5 (ref 5.0–8.0)

## 2017-07-29 LAB — MICROSCOPIC MESSAGE

## 2017-07-29 MED ORDER — METRONIDAZOLE 500 MG PO TABS: 500.0000 mg | ORAL_TABLET | Freq: Three times a day (TID) | ORAL | 1 refills | Status: DC

## 2017-07-29 MED ORDER — FLUCONAZOLE 150 MG PO TABS: ORAL_TABLET | ORAL | 1 refills | Status: DC

## 2017-07-29 NOTE — Progress Notes (Signed)
Subjective:     [name], female   DOB: [birth year], 47 y.o.   MRN: [id number]  HPI Yvette Mayer is a 47 year old black female, divorced in complaining of vaginal irritation after using premarin vaginal cream so she stopped.   Review of Systems Vagina irritation and discharge  Reviewed past medical,surgical, social and family history. Reviewed medications and allergies.     Objective:   Physical Exam BP 100/70 (BP Location: Left Arm, Patient Position: Sitting, Cuff Size: Small)   Pulse 98   Ht 5\' 4"  (1.626 m)   Wt 158 lb (71.7 kg)   BMI 27.12 kg/m   Skin warm and dry.Pelvic: external genitalia is normal in appearance no lesions, vagina: white discharge without odor,side walls red,urethra has no lesions or masses noted, cervix and uterus are absent,adnexa: no masses or tenderness noted. Bladder is non tender and no masses felt. Wet prep: + for clue cells and +WBCs.     Assessment:     1. Vaginal discharge   2. BV (bacterial vaginosis)   3. Vaginal atrophy   4. Vaginal irritation       Plan:     Meds ordered this encounter  Medications  . metroNIDAZOLE (FLAGYL) 500 MG tablet    Sig: Take 1 tablet (500 mg total) by mouth 3 (three) times daily.    Dispense:  21 tablet    Refill:  1    Order Specific Question:   Supervising Provider    Answer:   Elonda Husky, LUTHER H [[id number]]  . fluconazole (DIFLUCAN) 150 MG tablet    Sig: Take 1 now and in 3 days    Dispense:  2 tablet    Refill:  1    Order Specific Question:   Supervising Provider    Answer:   Tania Ade H [[id number]]  Review handout on BV F/U in 2 weeks

## 2017-07-29 NOTE — Patient Instructions (Signed)
Bacterial Vaginosis Bacterial vaginosis is a vaginal infection that occurs when the normal balance of bacteria in the vagina is disrupted. It results from an overgrowth of certain bacteria. This is the most common vaginal infection among women ages 15-44. Because bacterial vaginosis increases your risk for STIs (sexually transmitted infections), getting treated can help reduce your risk for chlamydia, gonorrhea, herpes, and HIV (human immunodeficiency virus). Treatment is also important for preventing complications in pregnant women, because this condition can cause an early (premature) delivery. What are the causes? This condition is caused by an increase in harmful bacteria that are normally present in small amounts in the vagina. However, the reason that the condition develops is not fully understood. What increases the risk? The following factors may make you more likely to develop this condition:  Having a new sexual partner or multiple sexual partners.  Having unprotected sex.  Douching.  Having an intrauterine device (IUD).  Smoking.  Drug and alcohol abuse.  Taking certain antibiotic medicines.  Being pregnant.  You cannot get bacterial vaginosis from toilet seats, bedding, swimming pools, or contact with objects around you. What are the signs or symptoms? Symptoms of this condition include:  Grey or white vaginal discharge. The discharge can also be watery or foamy.  A fish-like odor with discharge, especially after sexual intercourse or during menstruation.  Itching in and around the vagina.  Burning or pain with urination.  Some women with bacterial vaginosis have no signs or symptoms. How is this diagnosed? This condition is diagnosed based on:  Your medical history.  A physical exam of the vagina.  Testing a sample of vaginal fluid under a microscope to look for a large amount of bad bacteria or abnormal cells. Your health care provider may use a cotton swab  or a small wooden spatula to collect the sample.  How is this treated? This condition is treated with antibiotics. These may be given as a pill, a vaginal cream, or a medicine that is put into the vagina (suppository). If the condition comes back after treatment, a second round of antibiotics may be needed. Follow these instructions at home: Medicines  Take over-the-counter and prescription medicines only as told by your health care provider.  Take or use your antibiotic as told by your health care provider. Do not stop taking or using the antibiotic even if you start to feel better. General instructions  If you have a female sexual partner, tell her that you have a vaginal infection. She should see her health care provider and be treated if she has symptoms. If you have a female sexual partner, he does not need treatment.  During treatment: ? Avoid sexual activity until you finish treatment. ? Do not douche. ? Avoid alcohol as directed by your health care provider. ? Avoid breastfeeding as directed by your health care provider.  Drink enough water and fluids to keep your urine clear or pale yellow.  Keep the area around your vagina and rectum clean. ? Wash the area daily with warm water. ? Wipe yourself from front to back after using the toilet.  Keep all follow-up visits as told by your health care provider. This is important. How is this prevented?  Do not douche.  Wash the outside of your vagina with warm water only.  Use protection when having sex. This includes latex condoms and dental dams.  Limit how many sexual partners you have. To help prevent bacterial vaginosis, it is best to have sex with just   one partner (monogamous).  Make sure you and your sexual partner are tested for STIs.  Wear cotton or cotton-lined underwear.  Avoid wearing tight pants and pantyhose, especially during summer.  Limit the amount of alcohol that you drink.  Do not use any products that  contain nicotine or tobacco, such as cigarettes and e-cigarettes. If you need help quitting, ask your health care provider.  Do not use illegal drugs. Where to find more information:  Centers for Disease Control and Prevention: www.cdc.gov/std  American Sexual Health Association (ASHA): www.ashastd.org  U.S. Department of Health and Human Services, Office on Women's Health: www.womenshealth.gov/ or https://www.womenshealth.gov/a-z-topics/bacterial-vaginosis Contact a health care provider if:  Your symptoms do not improve, even after treatment.  You have more discharge or pain when urinating.  You have a fever.  You have pain in your abdomen.  You have pain during sex.  You have vaginal bleeding between periods. Summary  Bacterial vaginosis is a vaginal infection that occurs when the normal balance of bacteria in the vagina is disrupted.  Because bacterial vaginosis increases your risk for STIs (sexually transmitted infections), getting treated can help reduce your risk for chlamydia, gonorrhea, herpes, and HIV (human immunodeficiency virus). Treatment is also important for preventing complications in pregnant women, because the condition can cause an early (premature) delivery.  This condition is treated with antibiotic medicines. These may be given as a pill, a vaginal cream, or a medicine that is put into the vagina (suppository). This information is not intended to replace advice given to you by your health care provider. Make sure you discuss any questions you have with your health care provider. Document Released: 05/18/2005 Document Revised: 09/21/2016 Document Reviewed: 02/01/2016 Elsevier Interactive Patient Education  2018 Elsevier Inc.  

## 2017-08-04 ENCOUNTER — Other Ambulatory Visit: Payer: Self-pay | Admitting: *Deleted

## 2017-08-04 DIAGNOSIS — N200 Calculus of kidney: Secondary | ICD-10-CM

## 2017-08-04 MED ORDER — NITROFURANTOIN MONOHYD MACRO 100 MG PO CAPS: 100.0000 mg | ORAL_CAPSULE | Freq: Two times a day (BID) | ORAL | 0 refills | Status: DC

## 2017-08-05 ENCOUNTER — Ambulatory Visit (HOSPITAL_COMMUNITY)
Admission: RE | Admit: 2017-08-05 | Discharge: 2017-08-05 | Disposition: A | Discharge status: Home / Self Care | Payer: BLUE CROSS/BLUE SHIELD | Source: Ambulatory Visit | Attending: Family Medicine | Admitting: Family Medicine

## 2017-08-05 DIAGNOSIS — N200 Calculus of kidney: Secondary | ICD-10-CM | POA: Diagnosis not present

## 2017-08-05 DIAGNOSIS — R109 Unspecified abdominal pain: Secondary | ICD-10-CM | POA: Diagnosis not present

## 2017-08-13 ENCOUNTER — Encounter: Payer: Self-pay | Admitting: Adult Health

## 2017-08-13 ENCOUNTER — Ambulatory Visit: Payer: BLUE CROSS/BLUE SHIELD | Admitting: Adult Health

## 2017-08-13 ENCOUNTER — Other Ambulatory Visit: Payer: Self-pay

## 2017-08-13 VITALS — BP 98/60 | HR 62 | Ht 64.0 in | Wt 156.0 lb

## 2017-08-13 DIAGNOSIS — N952 Postmenopausal atrophic vaginitis: Secondary | ICD-10-CM

## 2017-08-13 DIAGNOSIS — Z87440 Personal history of urinary (tract) infections: Secondary | ICD-10-CM

## 2017-08-13 LAB — POCT URINALYSIS DIPSTICK
Blood, UA: NEGATIVE
Glucose, UA: NEGATIVE
Ketones, UA: NEGATIVE
LEUKOCYTES UA: NEGATIVE
Nitrite, UA: NEGATIVE
Protein, UA: NEGATIVE

## 2017-08-13 NOTE — Progress Notes (Signed)
Subjective:     [name], female   DOB: 1970/08/29, 47 y.o.   MRN: [id number]  HPI Yvette Mayer is a 47 year old black female back in follow up had yeast and BV and was dry.Feels less dry.   Review of Systems  Vaginal feels less dry Reviewed past medical,surgical, social and family history. Reviewed medications and allergies.     Objective:   Physical Exam BP 98/60 (BP Location: Right Arm, Patient Position: Sitting, Cuff Size: Normal)   Pulse 62   Ht 5\' 4"  (1.626 m)   Wt 156 lb (70.8 kg)   BMI 26.78 kg/m urine negative.Skin warm and dry.Pelvic: external genitalia is normal in appearance no lesions, vagina: white discharge with odor,urethra has no lesions or masses noted, cervix:smooth and bulbous, uterus: normal size, shape and contour, non tender, no masses felt, adnexa: no masses or tenderness noted. Bladder is non tender and no masses felt.     Assessment:     1. Vaginal atrophy   2. History of UTI       Plan:     Keep using Luvena F/U prn

## 2017-08-16 ENCOUNTER — Ambulatory Visit: Payer: BLUE CROSS/BLUE SHIELD | Admitting: Family Medicine

## 2017-08-17 ENCOUNTER — Ambulatory Visit: Payer: BLUE CROSS/BLUE SHIELD | Admitting: Nurse Practitioner

## 2017-08-17 DIAGNOSIS — J300 Vasomotor rhinitis: Secondary | ICD-10-CM | POA: Diagnosis not present

## 2017-08-17 DIAGNOSIS — H6993 Unspecified Eustachian tube disorder, bilateral: Secondary | ICD-10-CM | POA: Diagnosis not present

## 2017-08-25 ENCOUNTER — Other Ambulatory Visit: Payer: Self-pay | Admitting: Internal Medicine

## 2017-08-31 ENCOUNTER — Encounter: Payer: Self-pay | Admitting: Family Medicine

## 2017-09-10 ENCOUNTER — Ambulatory Visit: Payer: BLUE CROSS/BLUE SHIELD | Admitting: Family Medicine

## 2017-09-17 ENCOUNTER — Other Ambulatory Visit: Payer: Self-pay

## 2017-09-17 ENCOUNTER — Emergency Department (HOSPITAL_COMMUNITY): Payer: BLUE CROSS/BLUE SHIELD

## 2017-09-17 ENCOUNTER — Emergency Department (HOSPITAL_COMMUNITY)
Admission: EM | Admit: 2017-09-17 | Discharge: 2017-09-17 | Disposition: A | Discharge status: Home / Self Care | Payer: BLUE CROSS/BLUE SHIELD | Attending: Emergency Medicine | Admitting: Emergency Medicine

## 2017-09-17 ENCOUNTER — Encounter (HOSPITAL_COMMUNITY): Payer: Self-pay | Admitting: *Deleted

## 2017-09-17 DIAGNOSIS — R350 Frequency of micturition: Secondary | ICD-10-CM | POA: Insufficient documentation

## 2017-09-17 DIAGNOSIS — Y990 Civilian activity done for income or pay: Secondary | ICD-10-CM | POA: Diagnosis not present

## 2017-09-17 DIAGNOSIS — M79642 Pain in left hand: Secondary | ICD-10-CM | POA: Insufficient documentation

## 2017-09-17 DIAGNOSIS — R1030 Lower abdominal pain, unspecified: Secondary | ICD-10-CM | POA: Insufficient documentation

## 2017-09-17 DIAGNOSIS — W2209XA Striking against other stationary object, initial encounter: Secondary | ICD-10-CM | POA: Insufficient documentation

## 2017-09-17 DIAGNOSIS — Y9289 Other specified places as the place of occurrence of the external cause: Secondary | ICD-10-CM | POA: Insufficient documentation

## 2017-09-17 DIAGNOSIS — M79645 Pain in left finger(s): Secondary | ICD-10-CM | POA: Diagnosis not present

## 2017-09-17 DIAGNOSIS — J45909 Unspecified asthma, uncomplicated: Secondary | ICD-10-CM | POA: Insufficient documentation

## 2017-09-17 DIAGNOSIS — Z79899 Other long term (current) drug therapy: Secondary | ICD-10-CM | POA: Insufficient documentation

## 2017-09-17 DIAGNOSIS — Y9389 Activity, other specified: Secondary | ICD-10-CM | POA: Diagnosis not present

## 2017-09-17 DIAGNOSIS — M7989 Other specified soft tissue disorders: Secondary | ICD-10-CM | POA: Diagnosis not present

## 2017-09-17 DIAGNOSIS — R300 Dysuria: Secondary | ICD-10-CM | POA: Diagnosis not present

## 2017-09-17 LAB — URINALYSIS, ROUTINE W REFLEX MICROSCOPIC
Bacteria, UA: NONE SEEN
Bilirubin Urine: NEGATIVE
Glucose, UA: NEGATIVE mg/dL
KETONES UR: NEGATIVE mg/dL
Leukocytes, UA: NEGATIVE
Nitrite: NEGATIVE
PROTEIN: NEGATIVE mg/dL
Specific Gravity, Urine: 1.011 (ref 1.005–1.030)
pH: 5 (ref 5.0–8.0)

## 2017-09-17 MED ORDER — MELOXICAM 7.5 MG PO TABS: 7.5000 mg | ORAL_TABLET | Freq: Every day | ORAL | 0 refills | Status: DC

## 2017-09-17 NOTE — ED Triage Notes (Signed)
Pt c/o left hand pain and swelling x 1 week ago after she scraped it at work. Pt c/o pain when she tries to grip with her left hand. Pt also c/o lower abdominal pain and back pain x 3 days. Pt reports lower abdominal pressure when she urinates. Denies fever, hematuria.

## 2017-09-17 NOTE — Discharge Instructions (Signed)
Take mobic once a day with meals.  Do not take other anti-inflammatories at the same time open (Advil, Motrin, ibuprofen, naproxen, Aleve). You may supplement with Tylenol if you need further pain control. Make sure you are staying well-hydrated with water. Try to avoid products with caffeine including sodas, teas, and coffee. Follow-up with your OB/GYN for your scheduled appointment on Monday. Follow-up with your primary care doctor in 1 week if your hand pain is not improving. Return to the emergency room if you develop numbness, inability to move your hand, fevers, vomiting, vaginal bleeding, or any new or concerning symptoms.

## 2017-09-17 NOTE — ED Provider Notes (Addendum)
[facility] EMERGENCY DEPARTMENT Provider Note   CSN: [id number] Arrival date & time: 09/17/17  6010     History   Chief Complaint Chief Complaint  Patient presents with  . Hand Pain  . Abdominal Pain    HPI Yvette Mayer is a 47 y.o. female presenting for evaluation of left hand pain and urinary symptoms.  Patient states that last week, she hit the back of her hand against a box while at work.  Since then, she has had continued pain of her third and fourth metacarpals, with increasing swelling today.  She has not taken anything for this including Tylenol or ibuprofen.  Movement and palpation makes the pain worse, nothing makes it better.  She denies history of hand problems.  She denies pain in her wrist.  She denies numbness or tingling.  She is not on any blood thinners.  Pain is sharp and constant. Additionally, patient reports 3-day history of lower abdominal pressure with associated dysuria and urinary frequency.  She denies hematuria.  She denies fevers, chills, chest pain, shortness breath, nausea, vomiting, upper abdominal pain, or abnormal bowel movements.  She states this feels similar to when she had a UTI in the past.  HPI  Past Medical History:  Diagnosis Date  . Abdominal pain 06/12/2014  . Allergy   . Anemia    iron 1 yr ago, normal hgb 7/12  . Anxiety   . Asthma    pt says no-bronchitis  . Bloated abdomen 10/15/2015  . Chronic back pain   . Chronic headaches   . Chronic neck pain   . Chronic sinusitis   . Current use of estrogen therapy 04/16/2015  . Dyspareunia 09/12/2014  . Elevated TSH 04/16/2015  . Fatigue 04/10/2015  . Fibromyalgia   . Gas 10/15/2015  . GERD (gastroesophageal reflux disease)   . Herpes simplex virus (HSV) infection   . Hot flashes 04/10/2015  . Hyperlipidemia   . Kidney stone on right side 10/24/2015  . Mental disorder   . Ovarian cyst, right   . Paresthesias   . Shortness of breath   . Sinus drainage   . Sjoegren syndrome   .  Symptoms, such as flushing, sleeplessness, headache, lack of concentration, associated with the menopause 04/10/2015  . TMJ (dislocation of temporomandibular joint)   . Vaginal discharge 08/08/2015  . Vertigo   . Yeast infection 10/15/2015    Patient Active Problem List   Diagnosis Date Noted  . Vaginal irritation 07/29/2017  . Vaginal atrophy 07/29/2017  . ANA positive Speckled 1:320 titer ENA negative  11/20/2016  . Leukopenia 11/11/2016  . History of bruising easily 09/08/2016  . Borderline high cholesterol 09/08/2016  . Situational mixed anxiety and depressive disorder 08/24/2016  . Rectocele 07/16/2016  . Kidney stone on right side 10/24/2015  . Vaginal discharge 08/08/2015  . Hot flashes 04/10/2015  . Symptoms, such as flushing, sleeplessness, headache, lack of concentration, associated with the menopause 04/10/2015  . 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 Dyspareunia 09/12/2014  . BV (bacterial vaginosis) 03/21/2014  . Constipation 12/26/2013  . Overweight (BMI 25.0-29.9) 12/26/2013  . Sjoegren syndrome 08/09/2013  . Insomnia 09/13/2012  . GERD (gastroesophageal reflux disease) 09/18/2011  . Anxiety 09/03/2011  . Hyperlipidemia 09/02/2011  . Fibromyalgia 09/02/2011  . Chronic nasal congestion 09/02/2011  . Vitamin D deficiency 09/02/2011  . Dysphagia 01/13/2011    Past Surgical History:  Procedure Laterality Date  . ABDOMINAL HYSTERECTOMY    . BIOPSY  09/09/2016  Procedure: BIOPSY;  Surgeon: Daneil Dolin, MD;  Location: [facility];  Service: Endoscopy;;  esophageal  . BREAST CYST EXCISION Right 1980   benign  . CESAREAN SECTION     x2  . CHOLECYSTECTOMY  2005   cholelithiasis  . COLONOSCOPY N/A 09/09/2016   Procedure: COLONOSCOPY;  Surgeon: Daneil Dolin, MD;  Location: [facility];  Service: Endoscopy;  Laterality: N/A;  8:30am  . 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 ESOPHAGOGASTRODUODENOSCOPY N/A 09/09/2016   Procedure: ESOPHAGOGASTRODUODENOSCOPY (EGD);  Surgeon: Daneil Dolin, MD;  Location: [facility];  Service: Endoscopy;  Laterality: N/A;  Venia Minks DILATION  01/23/2011   Procedure: Venia Minks DILATION;  Surgeon: Daneil Dolin, MD;  Location: [facility];  Service: Endoscopy;  Laterality: N/A;  . Venia Minks DILATION N/A 09/09/2016   Procedure: Venia Minks DILATION;  Surgeon: Daneil Dolin, MD;  Location: [facility];  Service: Endoscopy;  Laterality: N/A;  . OVARIAN CYST REMOVAL  92 removal of cysts from behind ovaries  . PARTIAL HYSTERECTOMY  93  . SEPTOPLASTY     2011  . TEMPOROMANDIBULAR JOINT SURGERY    . TONSILLECTOMY     2010  . TURBINATE RESECTION Bilateral 05/29/2013   Procedure: TURBINATE RESECTION;  Surgeon: Ascencion Dike, MD;  Location: [facility];  Service: ENT;  Laterality: Bilateral;  . UPPER GASTROINTESTINAL ENDOSCOPY       OB History    Gravida  4   Para  3   Term  2   Preterm  1   AB  1   Living  3     SAB  1   TAB      Ectopic      Multiple      Live Births  3            Home Medications    Prior to Admission medications   Medication Sig Start Date End Date Taking? Authorizing Provider  conjugated estrogens (PREMARIN) vaginal cream Place 1 Applicatorful vaginally daily. Patient not taking: Reported on 07/29/2017 06/29/17   Florian Buff, MD  estradiol (VIVELLE-DOT) 0.1 MG/24HR patch Place 1 patch (0.1 mg total) onto the skin 2 (two) times a week. Patient not taking: Reported on 07/29/2017 07/01/17   Florian Buff, MD  fluconazole (DIFLUCAN) 150 MG tablet Take 1 now and in 3 days 07/29/17   Estill Dooms, NP  meloxicam (MOBIC) 7.5 MG tablet Take 1 tablet (7.5 mg total) by mouth daily. 09/17/17   Benay Pomeroy, PA-C  nitrofurantoin, macrocrystal-monohydrate, (MACROBID) 100 MG capsule Take 1 capsule (100 mg total) by mouth 2 (two) times daily. 08/04/17   Alycia Rossetti, MD  pantoprazole (PROTONIX) 40 MG tablet TAKE 1 TABLET BY MOUTH EVERY DAY 08/25/17   Annitta Needs, NP  Polyvinyl Alcohol-Povidone (REFRESH OP) Apply to eye every 3 (three) hours.    [provider]  RESTASIS 0.05 % ophthalmic emulsion Place 1 drop into both eyes 2 (two) times daily.  12/31/16   [provider]  valACYclovir (VALTREX) 1000 MG tablet Take 1 tablet (1,000 mg total) 2 (two) times daily by mouth. Patient not taking: Reported on 06/29/2017 04/08/17   Alycia Rossetti, MD    Family History Family History  Problem Relation Age of Onset  . Hypertension Mother   . Thyroid disease Sister  overactive  . Bronchitis Son   . Heart disease Maternal Grandmother   . Hypertension Maternal Grandmother   . Alcohol abuse Maternal Grandfather   . Scoliosis Son   . Other Maternal Aunt        Sjeogren syndrome  . Other Maternal Aunt        Sjoegren syndrome  . Other Maternal Aunt        Sjoegren syndrome  . Anesthesia problems Neg Hx   . Malignant hyperthermia Neg Hx   . Hypotension Neg Hx   . Pseudochol deficiency Neg Hx   . Colon cancer Neg Hx     Social History Social History   Tobacco Use  . Smoking status: Never Smoker  . Smokeless tobacco: Never Used  Substance Use Topics  . Alcohol use: No  . Drug use: No     Allergies   Hydrocodone-acetaminophen; Percocet [oxycodone-acetaminophen]; and Plaquenil [hydroxychloroquine sulfate]   Review of Systems Review of Systems  Genitourinary: Positive for dysuria and frequency. Negative for flank pain and vaginal discharge.       Lower abdominal pressure/fullness  Musculoskeletal: Positive for arthralgias and joint swelling.  Skin: Negative for wound.       No open wound/scrape  Neurological: Negative for numbness.  All other systems reviewed and are negative.    Physical Exam Updated Vital Signs BP 106/74   Pulse 63   Temp 98.3 F (36.8 C) (Oral)   Resp 16   Ht 5\' 4"  (1.626 m)   Wt  70.8 kg (156 lb)   SpO2 100%   BMI 26.78 kg/m   Physical Exam  Constitutional: She is oriented to person, place, and time. She appears well-developed and well-nourished. No distress.  Patient sitting comfortably in bed in no apparent distress.  HENT:  Head: Normocephalic and atraumatic.  Eyes: Pupils are equal, round, and reactive to light. Conjunctivae and EOM are normal.  Neck: Normal range of motion. Neck supple.  Cardiovascular: Normal rate, regular rhythm and intact distal pulses.  Pulmonary/Chest: Effort normal and breath sounds normal. No respiratory distress. She has no wheezes.  Abdominal: Soft. Bowel sounds are normal. She exhibits no distension and no mass. There is no tenderness. There is no rebound and no guarding.  No tenderness to palpation of the abdomen.  Soft without rigidity, guarding, or distention.  Musculoskeletal: Normal range of motion. She exhibits tenderness. She exhibits no edema or deformity.  No obvious swelling, warmth, erythema, or deformity of the left hand.  Mild tenderness to palpation of third and fourth metacarpals.  Strength against resistance intact.  Full active range of motion of wrist and fingers.  No pain at the anatomic snuffbox.  Grip strength intact bilaterally.  Radial pulses equal bilaterally.  Color and warmth equal bilaterally.  Sensation intact bilaterally.  Neurological: She is alert and oriented to person, place, and time. No sensory deficit.  Skin: Skin is warm and dry.  Psychiatric: She has a normal mood and affect.  Nursing note and vitals reviewed.    ED Treatments / Results  Labs (all labs ordered are listed, but only abnormal results are displayed) Labs Reviewed  URINALYSIS, ROUTINE W REFLEX MICROSCOPIC - Abnormal; Notable for the following components:      Result Value   Hgb urine dipstick SMALL (*)    Squamous Epithelial / LPF 0-5 (*)    All other components within normal limits    EKG None  Radiology Dg Hand Complete  Left  Result Date: 09/17/2017 CLINICAL  DATA:  Pain and swelling 1 week.  No injury EXAM: LEFT HAND - COMPLETE 3+ VIEW COMPARISON:  None. FINDINGS: There is no evidence of fracture or dislocation. There is no evidence of arthropathy or other focal bone abnormality. Soft tissues are unremarkable. IMPRESSION: Negative. Electronically Signed   By: Franchot Gallo M.D.   On: 09/17/2017 10:19    Procedures Procedures (including critical care time)  Medications Ordered in ED Medications - No data to display   Initial Impression / Assessment and Plan / ED Course  I have reviewed the triage vital signs and the nursing notes.  Pertinent labs & imaging results that were available during my care of the patient were reviewed by me and considered in my medical decision making (see chart for details).     Patient presenting for evaluation of left hand pain and urinary symptoms.  Physical exam shows patient who is afebrile not tachycardic.  She appears nontoxic.  Mild tenderness palpation of left metacarpals.  Will obtain hand x-ray and urine sample for further evaluation.  X-ray reviewed and interpreted by me, no sign of fracture dislocation.  Likely soft tissue injury.  neurovascualrly intact. Discussed findings with patient.  Discussed treatment with NSAIDs and Ace wrap.  Follow-up with primary care as needed.  Urine without signs of infection.  Discussed causes for bladder irritation including dehydration and caffeine use.  Patient states this began when she started drinking more tea with high levels of caffeine.  Discussed follow-up with OB/GYN as needed.  No vaginal discharge, doubt vaginal infection.  Discussed option of pelvic exam, but patient states she has an appointment with her OB/GYN on Monday, does not want one today.  At this time, patient appears safe for discharge.  Return precautions given.  Patient states she understands and agrees to plan.   Final Clinical Impressions(s) / ED Diagnoses    Final diagnoses:  Left hand pain  Dysuria    ED Discharge Orders        Ordered    meloxicam (MOBIC) 7.5 MG tablet  Daily     09/17/17 1031       Nicut, Danni Shima, PA-C 09/17/17 1540    Ashtyn Freilich, PA-C 09/17/17 1541    Milton Ferguson, MD 09/21/17 [phone]

## 2017-09-20 ENCOUNTER — Ambulatory Visit: Payer: BLUE CROSS/BLUE SHIELD | Admitting: Adult Health

## 2017-09-27 ENCOUNTER — Ambulatory Visit: Payer: BLUE CROSS/BLUE SHIELD | Admitting: Obstetrics & Gynecology

## 2017-10-06 ENCOUNTER — Encounter: Payer: Self-pay | Admitting: Adult Health

## 2017-10-06 ENCOUNTER — Ambulatory Visit (INDEPENDENT_AMBULATORY_CARE_PROVIDER_SITE_OTHER): Payer: BLUE CROSS/BLUE SHIELD | Admitting: Adult Health

## 2017-10-06 VITALS — BP 112/60 | HR 58 | Ht 64.0 in | Wt 157.0 lb

## 2017-10-06 DIAGNOSIS — Z01419 Encounter for gynecological examination (general) (routine) without abnormal findings: Secondary | ICD-10-CM

## 2017-10-06 DIAGNOSIS — Z1211 Encounter for screening for malignant neoplasm of colon: Secondary | ICD-10-CM

## 2017-10-06 DIAGNOSIS — Z1212 Encounter for screening for malignant neoplasm of rectum: Secondary | ICD-10-CM | POA: Diagnosis not present

## 2017-10-06 LAB — HEMOCCULT GUIAC POC 1CARD (OFFICE): FECAL OCCULT BLD: NEGATIVE

## 2017-10-06 MED ORDER — NYSTATIN-TRIAMCINOLONE 100000-0.1 UNIT/GM-% EX CREA: 1.0000 "application " | TOPICAL_CREAM | Freq: Two times a day (BID) | CUTANEOUS | 0 refills | Status: DC

## 2017-10-06 NOTE — Progress Notes (Signed)
[name], female   DOB: 01-08-1971, 47 y.o.   MRN: [id number] History of Present Illness: Yvette Mayer is a 47 year old black female, divorced in for a well woman gyn exam,she is sp hysterectomy. PCP is Dr Buelah Manis.    Current Medications, Allergies, Past Medical History, Past Surgical History, Family History and Social History were reviewed in [facility] record.     Review of Systems: Patient denies any headaches, hearing loss, fatigue, blurred vision, shortness of breath, chest pain, abdominal pain, problems with bowel movements, urination, or intercourse(not currently active). No joint pain or mood swings. +vaginal irritation.   Physical Exam:BP 112/60 (BP Location: Left Arm, Patient Position: Sitting, Cuff Size: Normal)   Pulse (!) 58   Ht 5\' 4"  (1.626 m)   Wt 157 lb (71.2 kg)   BMI 26.95 kg/m  General:  Well developed, well nourished, no acute distress Skin:  Warm and dry Neck:  Midline trachea, normal thyroid, good ROM, no lymphadenopathy Lungs; Clear to auscultation bilaterally Breast:  No dominant palpable mass, retraction, or nipple discharge Cardiovascular: Regular rate and rhythm Abdomen:  Soft, non tender, no hepatosplenomegaly Pelvic:  External genitalia is normal in appearance, no lesions.  The vagina is normal in appearance. Urethra has no lesions or masses. The cervix and uterus are absent.  No adnexal masses or tenderness noted.Bladder is non tender, no masses felt. Rectal: Good sphincter tone, no polyps, or hemorrhoids felt.  Hemoccult negative. Extremities/musculoskeletal:  No swelling or varicosities noted, no clubbing or cyanosis,?plantar wart on her left foot, has some pain third finger, lefthand. Psych:  No mood changes, alert and cooperative,seems happy PHQ 2 score 0. Will give mytrex for vulva irritation   Impression: 1. Well woman exam with routine gynecological exam   2. Screening for colorectal cancer        Plan:  Physical in 1 year Mammogram yearly Labs in near future Meds ordered this encounter  Medications  . nystatin-triamcinolone (MYCOLOG II) cream    Sig: Apply 1 application topically 2 (two) times daily.    Dispense:  30 g    Refill:  0    Order Specific Question:   Supervising Provider    Answer:   Tania Ade H [[id number]]

## 2017-10-11 ENCOUNTER — Encounter: Payer: Self-pay | Admitting: Family Medicine

## 2017-10-11 ENCOUNTER — Ambulatory Visit: Payer: BLUE CROSS/BLUE SHIELD | Admitting: Family Medicine

## 2017-10-12 ENCOUNTER — Ambulatory Visit: Payer: BLUE CROSS/BLUE SHIELD | Admitting: Allergy & Immunology

## 2017-10-12 ENCOUNTER — Encounter: Payer: Self-pay | Admitting: Allergy & Immunology

## 2017-10-12 VITALS — BP 120/74 | HR 62 | Temp 97.7°F | Resp 17 | Ht 64.17 in | Wt 158.0 lb

## 2017-10-12 DIAGNOSIS — H938X3 Other specified disorders of ear, bilateral: Secondary | ICD-10-CM | POA: Diagnosis not present

## 2017-10-12 DIAGNOSIS — T781XXD Other adverse food reactions, not elsewhere classified, subsequent encounter: Secondary | ICD-10-CM

## 2017-10-12 DIAGNOSIS — J302 Other seasonal allergic rhinitis: Secondary | ICD-10-CM | POA: Insufficient documentation

## 2017-10-12 DIAGNOSIS — J3089 Other allergic rhinitis: Secondary | ICD-10-CM | POA: Diagnosis not present

## 2017-10-12 DIAGNOSIS — T781XXA Other adverse food reactions, not elsewhere classified, initial encounter: Secondary | ICD-10-CM | POA: Insufficient documentation

## 2017-10-12 MED ORDER — FLUTICASONE PROPIONATE 93 MCG/ACT NA EXHU: 1.0000 | INHALANT_SUSPENSION | Freq: Two times a day (BID) | NASAL | 5 refills | Status: DC | PRN

## 2017-10-12 NOTE — Progress Notes (Signed)
NEW PATIENT  Date of Service/Encounter:  10/12/17  Referring provider: Alycia Rossetti, MD   Assessment:   Seasonal and perennial allergic rhinitis (ragweed, grasses, indoor molds, outdoor molds, dust mites and cockroach)  Sensation of fullness in both ears  Adverse food reaction (milk) - likely lactose intolerance  Plan/Recommendations:   1. Chronic rhinitis - Testing today showed: ragweed, grasses, indoor molds, outdoor molds, dust mites and cockroach - Avoidance measures provided. - Stop taking: Flonase - Start taking: Xhance (fluticasone) 1-2 sprays per nostril twice daily and an antihistamine daily (provided samples of Allegra and Xyzal) - Call us to let us know what works best and we can send in a prescription.   - We will send in the script to the [facility] outpatient pharmacy, and they will call you to confirm your shipping address. - You can review how to use the device here: https://www.xhance.com - Ask to be enrolled in the auto-refill program so you can get a year for free. - You can use an extra dose of the antihistamine, if needed, for breakthrough symptoms.  - Consider nasal saline rinses 1-2 times daily to remove allergens from the nasal cavities as well as help with mucous clearance (this is especially helpful to do before the nasal sprays are given). - You could consider allergy shots as well, which control symptoms in 75-85% of patients and provide long-lasting immune change.  - I would give this 1-2 months to see if it is going to help with the ear fullness. - If this does not work, I would go back to see Dr. Janace Hoard for possible surgical intervention.   2. Return in about 3 months (around 01/12/2018).  Subjective:   Griselda Bramblett Treinen is a 47 y.o. female presenting today for evaluation of  Chief Complaint  Patient presents with  . Nasal Congestion  . Sinus Problem    Caydance Kuehnle Cranfield has a history of the following: Patient Active Problem List   Diagnosis Date  Noted  . Seasonal and perennial allergic rhinitis 10/12/2017  . Adverse food reaction 10/12/2017  . Screening for colorectal cancer 10/06/2017  . Well woman exam with routine gynecological exam 10/06/2017  . Vaginal irritation 07/29/2017  . Vaginal atrophy 07/29/2017  . ANA positive Speckled 1:320 titer ENA negative  11/20/2016  . Leukopenia 11/11/2016  . History of bruising easily 09/08/2016  . Borderline high cholesterol 09/08/2016  . Situational mixed anxiety and depressive disorder 08/24/2016  . Rectocele 07/16/2016  . Kidney stone on right side 10/24/2015  . Vaginal discharge 08/08/2015  . Hot flashes 04/10/2015  . Symptoms, such as flushing, sleeplessness, headache, lack of concentration, associated with the menopause 04/10/2015  . 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 Dyspareunia 09/12/2014  . BV (bacterial vaginosis) 03/21/2014  . Constipation 12/26/2013  . Overweight (BMI 25.0-29.9) 12/26/2013  . Sjoegren syndrome 08/09/2013  . Insomnia 09/13/2012  . GERD (gastroesophageal reflux disease) 09/18/2011  . Anxiety 09/03/2011  . Hyperlipidemia 09/02/2011  . Fibromyalgia 09/02/2011  . Chronic nasal congestion 09/02/2011  . Vitamin D deficiency 09/02/2011  . Dysphagia 01/13/2011    History obtained from: chart review and patient .  Pleas Patricia Yakel was referred by Alycia Rossetti, MD.     Kayliah is a 47 y.o. female presenting for an evaluation of ear fullness. She has a history of TMJ. She had a wisdom tooth pulled in 2009. She reports that she has had congestion that has been ongoing. She feels that her "tubes are clogged up".  She reports postnasal drip and problems with nasal congestion. She does use a fancy nasal saline rinse machine which does help somewhat. She reports some crackling when this happens. She has problems with hearing. She was given antibiotics on a few occasions, but this never seems to help at all. She did get put on prednisone a while ago, but this did not seem to  help at all.   She went to see ENT (Dr. Janace Hoard in December 2018) and then she saw someone else prior to that. It was recommended that she get allergy testing before pursuing tube placement. She denies sneezing and itchy watery eyes. She does have some nasal pruritis. She has intermittent sneezing but this is fairly rare for her. She has been on Flonase without improvement. She has been taking Benadryl which helps her to sleep only. It has never helped the ear fullness. She has been on Claritin, Singulair, and Zantac without improvement in her symptoms.   She tolerates all of the major food allergens without adverse event. She does have some abdominal pain with gassiness, although she continues to drink milk anyway. She does have a history of IBS and has a very limited diet. She eats a lot of lettuce and greens as well as seafood, nuts, and peanuts. She also eats eggs. All of these she tolerates with adverse event aside from the cow's milk.     She works at [employer]. She has been there for seven years.   Otherwise, there is no history of other atopic diseases, including asthma, drug allergies, stinging insect allergies, or urticaria. There is no significant infectious history. Vaccinations are up to date.    Past Medical History: Patient Active Problem List   Diagnosis Date Noted  . Seasonal and perennial allergic rhinitis 10/12/2017  . Adverse food reaction 10/12/2017  . Screening for colorectal cancer 10/06/2017  . Well woman exam with routine gynecological exam 10/06/2017  . Vaginal irritation 07/29/2017  . Vaginal atrophy 07/29/2017  . ANA positive Speckled 1:320 titer ENA negative  11/20/2016  . Leukopenia 11/11/2016  . History of bruising easily 09/08/2016  . Borderline high cholesterol 09/08/2016  . Situational mixed anxiety and depressive disorder 08/24/2016  . Rectocele 07/16/2016  . Kidney stone on right side 10/24/2015  . Vaginal discharge 08/08/2015  . Hot  flashes 04/10/2015  . Symptoms, such as flushing, sleeplessness, headache, lack of concentration, associated with the menopause 04/10/2015  . 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 Dyspareunia 09/12/2014  . BV (bacterial vaginosis) 03/21/2014  . Constipation 12/26/2013  . Overweight (BMI 25.0-29.9) 12/26/2013  . Sjoegren syndrome 08/09/2013  . Insomnia 09/13/2012  . GERD (gastroesophageal reflux disease) 09/18/2011  . Anxiety 09/03/2011  . Hyperlipidemia 09/02/2011  . Fibromyalgia 09/02/2011  . Chronic nasal congestion 09/02/2011  . Vitamin D deficiency 09/02/2011  . Dysphagia 01/13/2011    Medication List:  Allergies as of 10/12/2017      Reactions   Hydrocodone-acetaminophen Itching   Percocet [oxycodone-acetaminophen] Itching   Plaquenil [hydroxychloroquine Sulfate] Rash      Medication List        Accurate as of 10/12/17  1:20 PM. Always use your most recent med list.          Fluticasone Propionate 93 MCG/ACT Exhu Commonly known as:  XHANCE Place 1-2 sprays into the nose 2 (two) times daily as needed.   pantoprazole 40 MG tablet Commonly known as:  PROTONIX TAKE 1 TABLET BY MOUTH EVERY DAY   REFRESH OP Apply  to eye every 3 (three) hours.       Birth History: non-contributory.   Developmental History: non-contributory.   Past Surgical History: Past Surgical History:  Procedure Laterality Date  . ABDOMINAL HYSTERECTOMY    . BIOPSY  09/09/2016   Procedure: BIOPSY;  Surgeon: Daneil Dolin, MD;  Location: [facility];  Service: Endoscopy;;  esophageal  . BREAST CYST EXCISION Right 1980   benign  . CESAREAN SECTION     x2  . CHOLECYSTECTOMY  2005   cholelithiasis  . COLONOSCOPY N/A 09/09/2016   Procedure: COLONOSCOPY;  Surgeon: Daneil Dolin, MD;  Location: [facility];  Service: Endoscopy;  Laterality: N/A;  8:30am  . 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 ESOPHAGOGASTRODUODENOSCOPY N/A 09/09/2016   Procedure: ESOPHAGOGASTRODUODENOSCOPY (EGD);  Surgeon: Daneil Dolin, MD;  Location: [facility];  Service: Endoscopy;  Laterality: N/A;  Venia Minks DILATION  01/23/2011   Procedure: Venia Minks DILATION;  Surgeon: Daneil Dolin, MD;  [facility]: [facility];  Service: Endoscopy;  Laterality: N/A;  . Venia Minks DILATION N/A 09/09/2016   Procedure: Venia Minks DILATION;  Surgeon: Daneil Dolin, MD;  Location: [facility];  Service: Endoscopy;  Laterality: N/A;  . OVARIAN CYST REMOVAL  92 removal of cysts from behind ovaries  . PARTIAL HYSTERECTOMY  93  . SEPTOPLASTY     2011  . TEMPOROMANDIBULAR JOINT SURGERY    . TONSILLECTOMY     2010  . TURBINATE RESECTION Bilateral 05/29/2013   Procedure: TURBINATE RESECTION;  Surgeon: Ascencion Dike, MD;  Location: [facility];  Service: ENT;  Laterality: Bilateral;  . UPPER GASTROINTESTINAL ENDOSCOPY       Family History: Family History  Problem Relation Age of Onset  . Hypertension Mother   . Thyroid disease Sister        overactive  . Asthma Sister   . Eczema Sister   . Bronchitis Son   . Heart disease Maternal Grandmother   . Hypertension Maternal Grandmother   . Alcohol abuse Maternal Grandfather   . Scoliosis Son   . Other Maternal Aunt        Sjeogren syndrome  . Other Maternal Aunt        Sjoegren syndrome  . Other Maternal Aunt        Sjoegren syndrome  . Anesthesia problems Neg Hx   . Malignant hyperthermia Neg Hx   . Hypotension Neg Hx   . Pseudochol deficiency Neg Hx   . Colon cancer Neg Hx      Social History: Nevah lives at home by herself. She lives in a townhome that is 48 years old. There is carpeting in the bedroom and the main living areas. There is electric heating and central cooling. There are no animals inside or outside of the home. There are dust mite coverings on the bedding. There is no tobacco exposure in the home.     Review of Systems: a  14-point review of systems is pertinent for what is mentioned in HPI.  Otherwise, all other systems were negative. Constitutional: negative other than that listed in the HPI Eyes: negative other than that listed in the HPI Ears, nose, mouth, throat, and face: negative other than that listed in the HPI Respiratory: negative other than that listed in the HPI Cardiovascular: negative other than that listed in the HPI Gastrointestinal: negative other than that listed in  the HPI Genitourinary: negative other than that listed in the HPI Integument: negative other than that listed in the HPI Hematologic: negative other than that listed in the HPI Musculoskeletal: negative other than that listed in the HPI Neurological: negative other than that listed in the HPI Allergy/Immunologic: negative other than that listed in the HPI    Objective:   Blood pressure 120/74, pulse 62, temperature 97.7 F (36.5 C), resp. rate 17, height 5' 4.17" (1.63 m), weight 158 lb (71.7 kg), SpO2 99 %. Body mass index is 26.97 kg/m.   Physical Exam:  General: Alert, interactive, in no acute distress. Pleasant female. Talkative.  Eyes: No conjunctival injection bilaterally, no discharge on the right, no discharge on the left and no Horner-Trantas dots present. PERRL bilaterally. EOMI without pain. No photophobia.  Ears: Right TM pearly gray with normal light reflex, Left TM pearly gray with normal light reflex, Right TM intact without perforation and Left TM intact without perforation.  Nose/Throat: External nose within normal limits and septum midline. Turbinates edematous and pale with clear discharge. Posterior oropharynx erythematous without cobblestoning in the posterior oropharynx. Tonsils 2+ without exudates.  Tongue without thrush. Neck: Supple without thyromegaly. Trachea midline. Adenopathy: no enlarged lymph nodes appreciated in the anterior cervical, occipital, axillary, epitrochlear, inguinal, or popliteal  regions. Lungs: Clear to auscultation without wheezing, rhonchi or rales. No increased work of breathing. CV: Normal S1/S2. No murmurs. Capillary refill <2 seconds.  Abdomen: Nondistended, nontender. No guarding or rebound tenderness. Bowel sounds present in all fields and hypoactive  Skin: Warm and dry, without lesions or rashes. Extremities:  No clubbing, cyanosis or edema. Neuro:   Grossly intact. No focal deficits appreciated. Responsive to questions.  Diagnostic studies:   Allergy Studies:   Indoor/Outdoor Percutaneous Adult Environmental Panel: negative to the entire panel with adequate controls.  Indoor/Outdoor Selected Intradermal Environmental Panel: positive to [employer], Grass mix, ragweed mix, mold mix #1, mold mix #2, mold mix #3, mold mix #4, cockroach and mite mix. Otherwise negative with adequate controls.   Allergy testing results were read and interpreted by myself, documented by clinical staff.       Salvatore Marvel, MD [facility] [place]

## 2017-10-12 NOTE — Patient Instructions (Addendum)
1. Chronic rhinitis - Testing today showed: ragweed, grasses, indoor molds, outdoor molds, dust mites and cockroach - Avoidance measures provided. - Stop taking: Flonase - Start taking: Xhance (fluticasone) 1-2 sprays per nostril twice daily and an antihistamine daily (provided samples of Allegra and Xyzal) - Call us to let us know what works best and we can send in a prescription.   - We will send in the script to the [facility] outpatient pharmacy, and they will call you to confirm your shipping address. - You can review how to use the device here: https://www.xhance.com - Ask to be enrolled in the auto-refill program so you can get a year for free. - You can use an extra dose of the antihistamine, if needed, for breakthrough symptoms.  - Consider nasal saline rinses 1-2 times daily to remove allergens from the nasal cavities as well as help with mucous clearance (this is especially helpful to do before the nasal sprays are given). - You could consider allergy shots as well, which control symptoms in 75-85% of patients and provide long-lasting immune change.  - I would give this 1-2 months to see if it is going to help with the ear fullness. - If this does not work, I would go back to see Dr. Janace Hoard for possible surgical intervention.   2. Return in about 3 months (around 01/12/2018).   Please inform us of any Emergency Department visits, hospitalizations, or changes in symptoms. Call us before going to the ED for breathing or allergy symptoms since we might be able to fit you in for a sick visit. Feel free to contact us anytime with any questions, problems, or concerns.  It was a pleasure to meet you today!  Websites that have reliable patient information: 1. American Academy of Asthma, Allergy, and Immunology: www.aaaai.org 2. Food Allergy Research and Education (FARE): foodallergy.org 3. Mothers of Asthmatics: http://www.asthmacommunitynetwork.org 4. American College of Allergy, Asthma, and  Immunology: www.acaai.org  Reducing Pollen Exposure  The American Academy of Allergy, Asthma and Immunology suggests the following steps to reduce your exposure to pollen during allergy seasons.    1. Do not hang sheets or clothing out to dry; pollen may collect on these items. 2. Do not mow lawns or spend time around freshly cut grass; mowing stirs up pollen. 3. Keep windows closed at night.  Keep car windows closed while driving. 4. Minimize morning activities outdoors, a time when pollen counts are usually at their highest. 5. Stay indoors as much as possible when pollen counts or humidity is high and on windy days when pollen tends to remain in the air longer. 6. Use air conditioning when possible.  Many air conditioners have filters that trap the pollen spores. 7. Use a HEPA room air filter to remove pollen form the indoor air you breathe.  Control of Mold Allergen   Mold and fungi can grow on a variety of surfaces provided certain temperature and moisture conditions exist.  Outdoor molds grow on plants, decaying vegetation and soil.  The major outdoor mold, Alternaria and Cladosporium, are found in very high numbers during hot and dry conditions.  Generally, a late Summer - Fall peak is seen for common outdoor fungal spores.  Rain will temporarily lower outdoor mold spore count, but counts rise rapidly when the rainy period ends.  The most important indoor molds are Aspergillus and Penicillium.  Dark, humid and poorly ventilated basements are ideal sites for mold growth.  The next most common sites of mold growth  are the bathroom and the kitchen.  Outdoor (Seasonal) Mold Control  Positive outdoor molds via skin testing: Alternaria, Cladosporium, Bipolaris (Helminthsporium), Drechslera (Curvalaria) and Mucor  1. Use air conditioning and keep windows closed 2. Avoid exposure to decaying vegetation. 3. Avoid leaf raking. 4. Avoid grain handling. 5. Consider wearing a face mask if working  in moldy areas.  6.   Indoor (Perennial) Mold Control   Positive indoor molds via skin testing: Aspergillus, Penicillium, Fusarium, Aureobasidium (Pullulara) and Rhizopus  1. Maintain humidity below 50%. 2. Clean washable surfaces with 5% bleach solution. 3. Remove sources e.g. contaminated carpets.     Control of House Dust Mite Allergen    House dust mites play a major role in allergic asthma and rhinitis.  They occur in environments with high humidity wherever human skin, the food for dust mites is found. High levels have been detected in dust obtained from mattresses, pillows, carpets, upholstered furniture, bed covers, clothes and soft toys.  The principal allergen of the house dust mite is found in its feces.  A gram of dust may contain 1,000 mites and 250,000 fecal particles.  Mite antigen is easily measured in the air during house cleaning activities.    1. Encase mattresses, including the box spring, and pillow, in an air tight cover.  Seal the zipper end of the encased mattresses with wide adhesive tape. 2. Wash the bedding in water of 130 degrees Farenheit weekly.  Avoid cotton comforters/quilts and flannel bedding: the most ideal bed covering is the dacron comforter. 3. Remove all upholstered furniture from the bedroom. 4. Remove carpets, carpet padding, rugs, and non-washable window drapes from the bedroom.  Wash drapes weekly or use plastic window coverings. 5. Remove all non-washable stuffed toys from the bedroom.  Wash stuffed toys weekly. 6. Have the room cleaned frequently with a vacuum cleaner and a damp dust-mop.  The patient should not be in a room which is being cleaned and should wait 1 hour after cleaning before going into the room. 7. Close and seal all heating outlets in the bedroom.  Otherwise, the room will become filled with dust-laden air.  An electric heater can be used to heat the room. 8. Reduce indoor humidity to less than 50%.  Do not use a  humidifier.  Control of Cockroach Allergen  Cockroach allergen has been identified as an important cause of acute attacks of asthma, especially in urban settings.  There are fifty-five species of cockroach that exist in the [place], however only three, the [place], [place] species produce allergen that can affect patients with Asthma.  Allergens can be obtained from fecal particles, egg casings and secretions from cockroaches.    1. Remove food sources. 2. Reduce access to water. 3. Seal access and entry points. 4. Spray runways with 0.5-1% Diazinon or Chlorpyrifos 5. Blow boric acid power under stoves and refrigerator. 6. Place bait stations (hydramethylnon) at feeding sites.  Allergy Shots   Allergies are the result of a chain reaction that starts in the immune system. Your immune system controls how your body defends itself. For instance, if you have an allergy to pollen, your immune system identifies pollen as an invader or allergen. Your immune system overreacts by producing antibodies called Immunoglobulin E (IgE). These antibodies travel to cells that release chemicals, causing an allergic reaction.  The concept behind allergy immunotherapy, whether it is received in the form of shots or tablets, is that the immune system can be desensitized to  specific allergens that trigger allergy symptoms. Although it requires time and patience, the payback can be long-term relief.  How Do Allergy Shots Work?  Allergy shots work much like a vaccine. Your body responds to injected amounts of a particular allergen given in increasing doses, eventually developing a resistance and tolerance to it. Allergy shots can lead to decreased, minimal or no allergy symptoms.  There generally are two phases: build-up and maintenance. Build-up often ranges from three to six months and involves receiving injections with increasing amounts of the allergens. The shots are typically given once or  twice a week, though more rapid build-up schedules are sometimes used.  The maintenance phase begins when the most effective dose is reached. This dose is different for each person, depending on how allergic you are and your response to the build-up injections. Once the maintenance dose is reached, there are longer periods between injections, typically two to four weeks.  Occasionally doctors give cortisone-type shots that can temporarily reduce allergy symptoms. These types of shots are different and should not be confused with allergy immunotherapy shots.  Who Can Be Treated with Allergy Shots?  Allergy shots may be a good treatment approach for people with allergic rhinitis (hay fever), allergic asthma, conjunctivitis (eye allergy) or stinging insect allergy.   Before deciding to begin allergy shots, you should consider:  . The length of allergy season and the severity of your symptoms . Whether medications and/or changes to your environment can control your symptoms . Your desire to avoid long-term medication use . Time: allergy immunotherapy requires a major time commitment . Cost: may vary depending on your insurance coverage  Allergy shots for children age 65 and older are effective and often well tolerated. They might prevent the onset of new allergen sensitivities or the progression to asthma.  Allergy shots are not started on patients who are pregnant but can be continued on patients who become pregnant while receiving them. In some patients with other medical conditions or who take certain common medications, allergy shots may be of risk. It is important to mention other medications you talk to your allergist.   When Will I Feel Better?  Some may experience decreased allergy symptoms during the build-up phase. For others, it may take as long as 12 months on the maintenance dose. If there is no improvement after a year of maintenance, your allergist will discuss other treatment  options with you.  If you aren't responding to allergy shots, it may be because there is not enough dose of the allergen in your vaccine or there are missing allergens that were not identified during your allergy testing. Other reasons could be that there are high levels of the allergen in your environment or major exposure to non-allergic triggers like tobacco smoke.  What Is the Length of Treatment?  Once the maintenance dose is reached, allergy shots are generally continued for three to five years. The decision to stop should be discussed with your allergist at that time. Some people may experience a permanent reduction of allergy symptoms. Others may relapse and a longer course of allergy shots can be considered.  What Are the Possible Reactions?  The two types of adverse reactions that can occur with allergy shots are local and systemic. Common local reactions include very mild redness and swelling at the injection site, which can happen immediately or several hours after. A systemic reaction, which is less common, affects the entire body or a particular body system. They are usually  mild and typically respond quickly to medications. Signs include increased allergy symptoms such as sneezing, a stuffy nose or hives.  Rarely, a serious systemic reaction called anaphylaxis can develop. Symptoms include swelling in the throat, wheezing, a feeling of tightness in the chest, nausea or dizziness. Most serious systemic reactions develop within 30 minutes of allergy shots. This is why it is strongly recommended you wait in your doctor's office for 30 minutes after your injections. Your allergist is trained to watch for reactions, and his or her staff is trained and equipped with the proper medications to identify and treat them.  Who Should Administer Allergy Shots?  The preferred location for receiving shots is your prescribing allergist's office. Injections can sometimes be given at another facility  where the physician and staff are trained to recognize and treat reactions, and have received instructions by your prescribing allergist.

## 2017-10-15 ENCOUNTER — Telehealth: Payer: Self-pay | Admitting: Allergy & Immunology

## 2017-10-15 NOTE — Telephone Encounter (Signed)
Patient called back and Yvette Mayer spoke to her and explained how to contact [facility]. Patient understood and agreed.

## 2017-10-15 NOTE — Telephone Encounter (Signed)
Left message for pt to call. Yvette Mayer was sent in to [facility] and will be mailed to patient. Remind pt to enroll in Auto refill.

## 2017-10-15 NOTE — Telephone Encounter (Signed)
Patient was seen 10-12-17 and was supposed to get a sample for fluticasone. Beth said we don't have samples of this, so patient would like a prescription sent in for it to [facility] in [place].

## 2017-10-18 ENCOUNTER — Encounter: Payer: Self-pay | Admitting: Family Medicine

## 2017-10-19 ENCOUNTER — Ambulatory Visit: Payer: BLUE CROSS/BLUE SHIELD | Admitting: Nurse Practitioner

## 2017-10-22 ENCOUNTER — Encounter: Payer: Self-pay | Admitting: Family Medicine

## 2017-10-22 ENCOUNTER — Other Ambulatory Visit: Payer: Self-pay

## 2017-10-22 ENCOUNTER — Ambulatory Visit (INDEPENDENT_AMBULATORY_CARE_PROVIDER_SITE_OTHER): Payer: BLUE CROSS/BLUE SHIELD | Admitting: Family Medicine

## 2017-10-22 VITALS — BP 118/68 | HR 70 | Temp 98.1°F | Resp 16 | Ht 64.0 in | Wt 154.0 lb

## 2017-10-22 DIAGNOSIS — L729 Follicular cyst of the skin and subcutaneous tissue, unspecified: Secondary | ICD-10-CM

## 2017-10-22 DIAGNOSIS — M79642 Pain in left hand: Secondary | ICD-10-CM

## 2017-10-22 DIAGNOSIS — M79671 Pain in right foot: Secondary | ICD-10-CM | POA: Diagnosis not present

## 2017-10-22 DIAGNOSIS — D3612 Benign neoplasm of peripheral nerves and autonomic nervous system, upper limb, including shoulder: Secondary | ICD-10-CM | POA: Diagnosis not present

## 2017-10-22 MED ORDER — MELOXICAM 7.5 MG PO TABS: 7.5000 mg | ORAL_TABLET | Freq: Every day | ORAL | 0 refills | Status: DC

## 2017-10-22 NOTE — Patient Instructions (Addendum)
Hand referral to [facility] Take mobic daily F/U as needed

## 2017-10-22 NOTE — Progress Notes (Signed)
   Subjective:    [name], female    DOB: 1971-03-19, 47 y.o.   MRN: [id number]  Patient presents for Hand Pain (L hand pain and possile swelling); R Foot Pain (states that she has a knot to the bottom of her foot and it hurts bad too); and Throat Issues (states that she was told she is allergic to dust and she is working in an area with a lot of dust)   Left hand and right foot pain for the past 3 months, at work she packs boxes. Left hand has had swelling. Her Foot feels a knot over foot pad and has heel pain. Worse with walking. She made appointment with podiatry June 17th in [place] Wears steel toe boots  She does does have some pain in both feet  4/19 seen at ER , given mobic xray neg    Allergist told her she had allergy to dust, given allegra D and xyzal. Also given nasal spray  Using a nasal irrigation system Also still getting treatment for TMJ   Feels cyst in her scalp, has been there for years ,states she had some drained a long time ago they came back  Review Of Systems:  GEN- denies fatigue, fever, weight loss,weakness, recent illness HEENT- denies eye drainage, change in vision, nasal discharge, CVS- denies chest pain, palpitations RESP- denies SOB, cough, wheeze ABD- denies N/V, change in stools, abd pain GU- denies dysuria, hematuria, dribbling, incontinence MSK- + joint pain, muscle aches, injury Neuro- denies headache, dizziness, syncope, seizure activity       Objective:    BP 118/68   Pulse 70   Temp 98.1 F (36.7 C) (Oral)   Resp 16   Ht 5\' 4"  (1.626 m)   Wt 154 lb (69.9 kg)   SpO2 98%   BMI 26.43 kg/m  GEN- NAD, alert and oriented x3 HEENT- PERRL, EOMI, non injected sclera, pink conjunctiva, MMM, oropharynx clear Neck- Supple, no thyromegaly CVS- RRR, no murmur RESP-CTAB MSK-Left- swelling base of middle finger left hand ,unable to make full fist sKIN- 3 small cyst in scalp   Bilat feet- normal inspection- TTP Right heel and  metarsal fat pad, mild callus  EXT- No edema Pulses- Radial, DP- 2+        Assessment & Plan:      Problem List Items Addressed This Visit    None    Visit Diagnoses    Left hand pain    -  Primary   concern for neuroma or cyst at base of finger, referral to hand surgeon   Neuroma of hand       Pain of right heel       plantar fascitis vs heel spur, has appt already scheduled with podiatry   Scalp cyst       benign lesions discussed would have to be surgically removed, very small, present for years will leave      Note: This dictation was prepared with Dragon dictation along with smaller phrase technology. Any transcriptional errors that result from this process are unintentional.

## 2017-10-25 ENCOUNTER — Encounter: Payer: Self-pay | Admitting: Family Medicine

## 2017-11-03 ENCOUNTER — Other Ambulatory Visit: Payer: BLUE CROSS/BLUE SHIELD

## 2017-11-10 ENCOUNTER — Other Ambulatory Visit: Payer: BLUE CROSS/BLUE SHIELD

## 2017-11-10 ENCOUNTER — Other Ambulatory Visit: Payer: Self-pay | Admitting: Family Medicine

## 2017-11-10 DIAGNOSIS — Z13220 Encounter for screening for lipoid disorders: Secondary | ICD-10-CM | POA: Diagnosis not present

## 2017-11-10 DIAGNOSIS — Z1329 Encounter for screening for other suspected endocrine disorder: Secondary | ICD-10-CM | POA: Diagnosis not present

## 2017-11-10 DIAGNOSIS — Z0000 Encounter for general adult medical examination without abnormal findings: Secondary | ICD-10-CM | POA: Diagnosis not present

## 2017-11-10 DIAGNOSIS — Z1231 Encounter for screening mammogram for malignant neoplasm of breast: Secondary | ICD-10-CM

## 2017-11-11 LAB — COMPREHENSIVE METABOLIC PANEL
ALT: 18 IU/L (ref 0–32)
AST: 27 IU/L (ref 0–40)
Albumin/Globulin Ratio: 1.5 (ref 1.2–2.2)
Albumin: 4.7 g/dL (ref 3.5–5.5)
Alkaline Phosphatase: 83 IU/L (ref 39–117)
BUN/Creatinine Ratio: 21 (ref 9–23)
BUN: 18 mg/dL (ref 6–24)
Bilirubin Total: 0.4 mg/dL (ref 0.0–1.2)
CALCIUM: 10 mg/dL (ref 8.7–10.2)
CO2: 24 mmol/L (ref 20–29)
CREATININE: 0.84 mg/dL (ref 0.57–1.00)
Chloride: 99 mmol/L (ref 96–106)
GFR calc Af Amer: 96 mL/min/{1.73_m2} (ref >59)
GFR, EST NON AFRICAN AMERICAN: 83 mL/min/{1.73_m2} (ref >59)
GLOBULIN, TOTAL: 3.2 g/dL (ref 1.5–4.5)
GLUCOSE: 77 mg/dL (ref 65–99)
Potassium: 5 mmol/L (ref 3.5–5.2)
Sodium: 138 mmol/L (ref 134–144)
Total Protein: 7.9 g/dL (ref 6.0–8.5)

## 2017-11-11 LAB — LIPID PANEL
CHOLESTEROL TOTAL: 188 mg/dL (ref 100–199)
Chol/HDL Ratio: 3 ratio (ref 0.0–4.4)
HDL: 63 mg/dL (ref >39)
LDL CALC: 117 mg/dL — AB (ref 0–99)
TRIGLYCERIDES: 41 mg/dL (ref 0–149)
VLDL CHOLESTEROL CAL: 8 mg/dL (ref 5–40)

## 2017-11-11 LAB — CBC
HEMATOCRIT: 40.3 % (ref 34.0–46.6)
HEMOGLOBIN: 13.5 g/dL (ref 11.1–15.9)
MCH: 30.8 pg (ref 26.6–33.0)
MCHC: 33.5 g/dL (ref 31.5–35.7)
MCV: 92 fL (ref 79–97)
Platelets: 370 10*3/uL (ref 150–450)
RBC: 4.39 x10E6/uL (ref 3.77–5.28)
RDW: 13.1 % (ref 12.3–15.4)
WBC: 4.3 10*3/uL (ref 3.4–10.8)

## 2017-11-11 LAB — TSH: TSH: 2.1 u[IU]/mL (ref 0.450–4.500)

## 2017-11-11 LAB — VITAMIN D 25 HYDROXY (VIT D DEFICIENCY, FRACTURES): Vit D, 25-Hydroxy: 43.3 ng/mL (ref 30.0–100.0)

## 2017-11-12 ENCOUNTER — Telehealth: Payer: Self-pay | Admitting: Adult Health

## 2017-11-12 NOTE — Telephone Encounter (Signed)
Left message with labs results and they all looked good

## 2017-11-15 DIAGNOSIS — G629 Polyneuropathy, unspecified: Secondary | ICD-10-CM | POA: Diagnosis not present

## 2017-11-15 DIAGNOSIS — M722 Plantar fascial fibromatosis: Secondary | ICD-10-CM | POA: Diagnosis not present

## 2017-11-15 DIAGNOSIS — M797 Fibromyalgia: Secondary | ICD-10-CM | POA: Diagnosis not present

## 2017-11-15 DIAGNOSIS — M2141 Flat foot [pes planus] (acquired), right foot: Secondary | ICD-10-CM | POA: Diagnosis not present

## 2017-11-16 ENCOUNTER — Ambulatory Visit: Payer: BLUE CROSS/BLUE SHIELD | Admitting: Adult Health

## 2017-11-19 DIAGNOSIS — K219 Gastro-esophageal reflux disease without esophagitis: Secondary | ICD-10-CM | POA: Diagnosis not present

## 2017-11-19 DIAGNOSIS — J301 Allergic rhinitis due to pollen: Secondary | ICD-10-CM | POA: Diagnosis not present

## 2017-11-19 DIAGNOSIS — H6980 Other specified disorders of Eustachian tube, unspecified ear: Secondary | ICD-10-CM | POA: Diagnosis not present

## 2017-11-19 DIAGNOSIS — H903 Sensorineural hearing loss, bilateral: Secondary | ICD-10-CM | POA: Diagnosis not present

## 2017-11-29 ENCOUNTER — Ambulatory Visit: Payer: BLUE CROSS/BLUE SHIELD | Admitting: Adult Health

## 2017-11-29 ENCOUNTER — Encounter: Payer: Self-pay | Admitting: Adult Health

## 2017-11-29 ENCOUNTER — Encounter (INDEPENDENT_AMBULATORY_CARE_PROVIDER_SITE_OTHER): Payer: Self-pay

## 2017-11-29 VITALS — BP 94/53 | HR 52 | Ht 64.0 in | Wt 157.0 lb

## 2017-11-29 DIAGNOSIS — N941 Unspecified dyspareunia: Secondary | ICD-10-CM | POA: Diagnosis not present

## 2017-11-29 DIAGNOSIS — N898 Other specified noninflammatory disorders of vagina: Secondary | ICD-10-CM

## 2017-11-29 DIAGNOSIS — Z87440 Personal history of urinary (tract) infections: Secondary | ICD-10-CM | POA: Diagnosis not present

## 2017-11-29 DIAGNOSIS — N952 Postmenopausal atrophic vaginitis: Secondary | ICD-10-CM

## 2017-11-29 LAB — POCT URINALYSIS DIPSTICK
Blood, UA: NEGATIVE
GLUCOSE UA: NEGATIVE
Leukocytes, UA: NEGATIVE
Nitrite, UA: NEGATIVE
Protein, UA: NEGATIVE

## 2017-11-29 MED ORDER — ESTRADIOL 10 MCG VA TABS: ORAL_TABLET | VAGINAL | 3 refills | Status: DC

## 2017-11-29 NOTE — Progress Notes (Signed)
  Subjective:     [name], female   DOB: Dec 06, 1970, 47 y.o.   MRN: [id number]  HPI Yvette Mayer is a 47 year old black female, in complaining of vaginal dryness and pain with sex.She wants urine checked, had history of UTI, and had ?breast lump last week, none now.   Review of Systems +vaginal dryness +pain with sex ?lump right breast last week,none now. Reviewed past medical,surgical, social and family history. Reviewed medications and allergies.     Objective:   Physical Exam BP (!) 94/53 (BP Location: Left Arm, Patient Position: Sitting, Cuff Size: Small)   Pulse (!) 52   Ht 5\' 4"  (1.626 m)   Wt 157 lb (71.2 kg)   BMI 26.95 kg/m   Urine dipstick is negative.   Skin warm and dry,  Breasts:no dominate palpable mass, retraction or nipple discharge  Pelvic: external genitalia is normal in appearance no lesions, vagina:pale pink with loss of moisture and rugae, urethra has no lesions or masses noted, cervix, and uterus are absent, adnexa: no masses or tenderness noted. Bladder is non tender and no masses felt.  Will add vaginal estrogen.  Assessment:     1. Vaginal atrophy   2. Vaginal dryness   3. Dyspareunia, female   4. History of UTI       Plan:    Use lubricate with sex  Meds ordered this encounter  Medications  . Estradiol 10 MCG TABS vaginal tablet    Sig: Place 1 tablet in vagina at bedtime for 2 weeks then use 2 x weekly    Dispense:  18 tablet    Refill:  3    Order Specific Question:   Supervising Provider    Answer:   Tania Ade H [[id number]]  F/U in 4 weeks for recheck Has mammogram scheduled

## 2017-11-30 ENCOUNTER — Telehealth: Payer: Self-pay | Admitting: Internal Medicine

## 2017-11-30 DIAGNOSIS — L290 Pruritus ani: Secondary | ICD-10-CM

## 2017-11-30 NOTE — Telephone Encounter (Signed)
lmom waiting on a return call.  

## 2017-11-30 NOTE — Telephone Encounter (Signed)
Spoke with pt and she continues to itching on her rectum. Pt states its been going on for several months. Pt was last seen 01/2017 and her itching hasn't improved. Pt has seen her GYN doctor for itching and yeast isn't present. Pt would like to complete a test for parasites. please advise.

## 2017-11-30 NOTE — Telephone Encounter (Signed)
PLEASE CALL PATIENT [phone], PATIENT WANTS TO BE CHECKED FOR PARASITES BECAUSE SHE IS HAVING ANAL ITCHING.

## 2017-12-06 ENCOUNTER — Encounter (HOSPITAL_COMMUNITY): Payer: Self-pay

## 2017-12-06 ENCOUNTER — Ambulatory Visit (HOSPITAL_COMMUNITY)
Admission: RE | Admit: 2017-12-06 | Discharge: 2017-12-06 | Disposition: A | Discharge status: Home / Self Care | Payer: BLUE CROSS/BLUE SHIELD | Source: Ambulatory Visit | Attending: Family Medicine | Admitting: Family Medicine

## 2017-12-06 DIAGNOSIS — Z1231 Encounter for screening mammogram for malignant neoplasm of breast: Secondary | ICD-10-CM

## 2017-12-13 NOTE — Telephone Encounter (Signed)
Pt is aware and said she will go to the lab tomorrow morning for the container.

## 2017-12-13 NOTE — Telephone Encounter (Signed)
O&P order entered. She'll need to pick up a specimen kit from the lab. Will call with results.

## 2017-12-13 NOTE — Addendum Note (Signed)
Addended by: Gordy Levan, Preet Perrier A on: 12/13/2017 12:32 PM   Modules accepted: Orders

## 2017-12-14 DIAGNOSIS — L290 Pruritus ani: Secondary | ICD-10-CM | POA: Diagnosis not present

## 2017-12-14 NOTE — Telephone Encounter (Signed)
Noted  

## 2017-12-15 LAB — OVA AND PARASITE EXAMINATION
CONCENTRATE RESULT:: NONE SEEN
MICRO NUMBER:: 90840575
SPECIMEN QUALITY:: ADEQUATE
TRICHROME RESULT:: NONE SEEN

## 2017-12-17 DIAGNOSIS — M797 Fibromyalgia: Secondary | ICD-10-CM | POA: Diagnosis not present

## 2017-12-17 DIAGNOSIS — R6884 Jaw pain: Secondary | ICD-10-CM | POA: Diagnosis not present

## 2017-12-17 DIAGNOSIS — M542 Cervicalgia: Secondary | ICD-10-CM | POA: Diagnosis not present

## 2017-12-24 ENCOUNTER — Telehealth: Payer: Self-pay | Admitting: Internal Medicine

## 2017-12-24 DIAGNOSIS — H6690 Otitis media, unspecified, unspecified ear: Secondary | ICD-10-CM | POA: Diagnosis not present

## 2017-12-24 DIAGNOSIS — J069 Acute upper respiratory infection, unspecified: Secondary | ICD-10-CM | POA: Diagnosis not present

## 2017-12-24 DIAGNOSIS — J329 Chronic sinusitis, unspecified: Secondary | ICD-10-CM | POA: Diagnosis not present

## 2017-12-24 NOTE — Telephone Encounter (Signed)
Forwarding to Walden Field, NP for results.

## 2017-12-24 NOTE — Telephone Encounter (Signed)
Pt was calling to check on her O and P results. She said it was done on 7/16. Please call [phone]

## 2017-12-27 ENCOUNTER — Telehealth: Payer: Self-pay | Admitting: Internal Medicine

## 2017-12-27 ENCOUNTER — Encounter: Payer: Self-pay | Admitting: Adult Health

## 2017-12-27 ENCOUNTER — Ambulatory Visit: Payer: BLUE CROSS/BLUE SHIELD | Admitting: Adult Health

## 2017-12-27 VITALS — BP 112/72 | HR 58 | Ht 65.0 in | Wt 156.6 lb

## 2017-12-27 DIAGNOSIS — N952 Postmenopausal atrophic vaginitis: Secondary | ICD-10-CM

## 2017-12-27 MED ORDER — ESTRADIOL 10 MCG VA TABS: ORAL_TABLET | VAGINAL | 3 refills | Status: DC

## 2017-12-27 NOTE — Telephone Encounter (Signed)
Pt is inquiring about results. 

## 2017-12-27 NOTE — Progress Notes (Signed)
  Subjective:     [name], female   DOB: 1970-09-27, 47 y.o.   MRN: [id number]  HPI Yvette Mayer is a 47 year old black female back in follow up on starting vagifem and os feeling better, but did have more gas since starting.   Review of Systems +gas with vagifem Sex toys feel irritating,she uses condom on them Reviewed past medical,surgical, social and family history. Reviewed medications and allergies.     Objective:   Physical Exam BP 112/72 (BP Location: Left Arm, Patient Position: Sitting, Cuff Size: Normal)   Pulse (!) 58   Ht 5\' 5"  (1.651 m)   Wt 156 lb 9.6 oz (71 kg)   BMI 26.06 kg/m    Skin warm and dry.Pelvic: external genitalia is normal in appearance no lesions, vagina: tissues pinker, with better moisture,urethra has no lesions or masses noted, cervix and uterus are absent. adnexa: no masses or tenderness noted. Bladder is non tender and no masses felt.   Assessment:     Vaginal atrophy, resolving    Plan:    Use lubricate on sex toys Continue vagifem Meds ordered this encounter  Medications  . Estradiol 10 MCG TABS vaginal tablet    Sig: use 2 x weekly in vagina    Dispense:  24 tablet    Refill:  3    Order Specific Question:   Supervising Provider    Answer:   Yvette Mayer [[id number]]  F/U prn

## 2017-12-27 NOTE — Telephone Encounter (Signed)
PATIENT CALLED AND SAID WE CHECKED HER FOR PARASITES 2 WEEKS AGO AND SHE STILL HAS NOT HEARD RESULTS

## 2017-12-28 ENCOUNTER — Telehealth: Payer: Self-pay | Admitting: Internal Medicine

## 2017-12-28 ENCOUNTER — Encounter: Payer: Self-pay | Admitting: Internal Medicine

## 2017-12-28 NOTE — Telephone Encounter (Signed)
Spoke with pt. Lab work hasn't been reviewed. Pt is aware that we will call her with results when they've resulted.

## 2017-12-28 NOTE — Telephone Encounter (Signed)
Pt has called back again today saying it's been 2 weeks and she still hasn't heard anything about her stool results. Please call her ASAP [phone]

## 2017-12-29 NOTE — Telephone Encounter (Signed)
See result note.  

## 2017-12-30 NOTE — Telephone Encounter (Signed)
Pt notified of results

## 2018-01-10 ENCOUNTER — Other Ambulatory Visit: Payer: Self-pay

## 2018-01-10 ENCOUNTER — Encounter (HOSPITAL_COMMUNITY): Payer: Self-pay | Admitting: Emergency Medicine

## 2018-01-10 ENCOUNTER — Emergency Department (HOSPITAL_COMMUNITY)
Admission: EM | Admit: 2018-01-10 | Discharge: 2018-01-10 | Disposition: A | Discharge status: Home / Self Care | Payer: BLUE CROSS/BLUE SHIELD | Attending: Emergency Medicine | Admitting: Emergency Medicine

## 2018-01-10 ENCOUNTER — Emergency Department (HOSPITAL_COMMUNITY): Payer: BLUE CROSS/BLUE SHIELD

## 2018-01-10 DIAGNOSIS — K5900 Constipation, unspecified: Secondary | ICD-10-CM | POA: Insufficient documentation

## 2018-01-10 DIAGNOSIS — J45909 Unspecified asthma, uncomplicated: Secondary | ICD-10-CM | POA: Insufficient documentation

## 2018-01-10 DIAGNOSIS — Z79899 Other long term (current) drug therapy: Secondary | ICD-10-CM | POA: Diagnosis not present

## 2018-01-10 DIAGNOSIS — R1084 Generalized abdominal pain: Secondary | ICD-10-CM

## 2018-01-10 DIAGNOSIS — R109 Unspecified abdominal pain: Secondary | ICD-10-CM | POA: Diagnosis not present

## 2018-01-10 LAB — CBC WITH DIFFERENTIAL/PLATELET
BASOS PCT: 2 %
Basophils Absolute: 0.1 10*3/uL (ref 0.0–0.1)
EOS ABS: 0.1 10*3/uL (ref 0.0–0.7)
EOS PCT: 2 %
HCT: 40.4 % (ref 36.0–46.0)
HEMOGLOBIN: 13.5 g/dL (ref 12.0–15.0)
LYMPHS PCT: 63 %
Lymphs Abs: 1.9 10*3/uL (ref 0.7–4.0)
MCH: 31 pg (ref 26.0–34.0)
MCHC: 33.4 g/dL (ref 30.0–36.0)
MCV: 92.9 fL (ref 78.0–100.0)
MONO ABS: 0.3 10*3/uL (ref 0.1–1.0)
Monocytes Relative: 8 %
NEUTROS ABS: 0.8 10*3/uL — AB (ref 1.7–7.7)
Neutrophils Relative %: 25 %
Platelets: 303 10*3/uL (ref 150–400)
RBC: 4.35 MIL/uL (ref 3.87–5.11)
RDW: 12.3 % (ref 11.5–15.5)
WBC: 3.2 10*3/uL — ABNORMAL LOW (ref 4.0–10.5)

## 2018-01-10 LAB — URINALYSIS, ROUTINE W REFLEX MICROSCOPIC
Bilirubin Urine: NEGATIVE
Glucose, UA: NEGATIVE mg/dL
Hgb urine dipstick: NEGATIVE
KETONES UR: NEGATIVE mg/dL
LEUKOCYTES UA: NEGATIVE
NITRITE: NEGATIVE
PH: 8 (ref 5.0–8.0)
PROTEIN: NEGATIVE mg/dL
Specific Gravity, Urine: 1.011 (ref 1.005–1.030)

## 2018-01-10 LAB — COMPREHENSIVE METABOLIC PANEL
ALBUMIN: 3.7 g/dL (ref 3.5–5.0)
ALK PHOS: 73 U/L (ref 38–126)
ALT: 16 U/L (ref 0–44)
AST: 21 U/L (ref 15–41)
Anion gap: 5 (ref 5–15)
BILIRUBIN TOTAL: 0.8 mg/dL (ref 0.3–1.2)
BUN: 12 mg/dL (ref 6–20)
CALCIUM: 9.1 mg/dL (ref 8.9–10.3)
CO2: 29 mmol/L (ref 22–32)
Chloride: 106 mmol/L (ref 98–111)
Creatinine, Ser: 0.79 mg/dL (ref 0.44–1.00)
GFR calc Af Amer: >60 mL/min (ref >60)
GFR calc non Af Amer: >60 mL/min (ref >60)
GLUCOSE: 93 mg/dL (ref 70–99)
Potassium: 3.6 mmol/L (ref 3.5–5.1)
Sodium: 140 mmol/L (ref 135–145)
TOTAL PROTEIN: 7.2 g/dL (ref 6.5–8.1)

## 2018-01-10 LAB — LIPASE, BLOOD: Lipase: 30 U/L (ref 11–51)

## 2018-01-10 MED ORDER — SODIUM CHLORIDE 0.9 % IV BOLUS
1000.0000 mL | Freq: Once | INTRAVENOUS | Status: AC
  Administered 2018-01-10: 1000 mL via INTRAVENOUS

## 2018-01-10 MED ORDER — IOPAMIDOL (ISOVUE-300) INJECTION 61%
100.0000 mL | Freq: Once | INTRAVENOUS | Status: AC | PRN
  Administered 2018-01-10: 100 mL via INTRAVENOUS

## 2018-01-10 NOTE — Discharge Instructions (Addendum)
Magnesium citrate: Drink the entire 10 ounce bottle mixed with equal parts Sprite or Gatorade for relief of constipation.  Return to the emergency department if you develop worsening pain, high fever, bloody stools, or other new and concerning symptoms.

## 2018-01-10 NOTE — ED Provider Notes (Signed)
[facility] EMERGENCY DEPARTMENT Provider Note   CSN: [id number] Arrival date & time: 01/10/18  7829     History   Chief Complaint Chief Complaint  Patient presents with  . Abdominal Pain    HPI Yvette Mayer is a 47 y.o. female.  Patient is a 47 year old female with history of Sjogren's, fibromyalgia, anxiety.  She presents today for evaluation of abdominal pain.  She reports she started with TMJ pain last week.  She took several doses of ibuprofen, then her stomach began to hurt.  She has been having intermittent constipation and has also noticed a small amount of blood on the toilet paper after one bowel movement.  She denies any black stool.  She denies any fevers or chills.  She has history of prior cholecystectomy and also a colonoscopy this year which she tells me was unremarkable.  The history is provided by the patient.  Abdominal Pain   This is a new problem. The current episode started 2 days ago. The problem occurs constantly. The problem has been gradually worsening. The pain is associated with an unknown factor. The pain is located in the generalized abdominal region. The quality of the pain is cramping. The pain is moderate.    Past Medical History:  Diagnosis Date  . Abdominal pain 06/12/2014  . Allergy   . Anemia    iron 1 yr ago, normal hgb 7/12  . Anxiety   . Asthma    pt says no-bronchitis  . Bloated abdomen 10/15/2015  . Chronic back pain   . Chronic headaches   . Chronic neck pain   . Chronic sinusitis   . Current use of estrogen therapy 04/16/2015  . Dyspareunia 09/12/2014  . Elevated TSH 04/16/2015  . Fatigue 04/10/2015  . Fibromyalgia   . Gas 10/15/2015  . GERD (gastroesophageal reflux disease)   . Herpes simplex virus (HSV) infection   . Hot flashes 04/10/2015  . Hyperlipidemia   . Kidney stone on right side 10/24/2015  . Mental disorder   . Ovarian cyst, right   . Paresthesias   . Shortness of breath   . Sinus drainage   . Sjoegren  syndrome   . Symptoms, such as flushing, sleeplessness, headache, lack of concentration, associated with the menopause 04/10/2015  . TMJ (dislocation of temporomandibular joint)   . Vaginal discharge 08/08/2015  . Vertigo   . Yeast infection 10/15/2015    Patient Active Problem List   Diagnosis Date Noted  . History of UTI 11/29/2017  . Dyspareunia, female 11/29/2017  . Vaginal dryness 11/29/2017  . Seasonal and perennial allergic rhinitis 10/12/2017  . Adverse food reaction 10/12/2017  . Screening for colorectal cancer 10/06/2017  . Well woman exam with routine gynecological exam 10/06/2017  . Vaginal irritation 07/29/2017  . Vaginal atrophy 07/29/2017  . ANA positive Speckled 1:320 titer ENA negative  11/20/2016  . Leukopenia 11/11/2016  . History of bruising easily 09/08/2016  . Borderline high cholesterol 09/08/2016  . Situational mixed anxiety and depressive disorder 08/24/2016  . Rectocele 07/16/2016  . Kidney stone on right side 10/24/2015  . Vaginal discharge 08/08/2015  . Hot flashes 04/10/2015  . Symptoms, such as flushing, sleeplessness, headache, lack of concentration, associated with the menopause 04/10/2015  . 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 Dyspareunia 09/12/2014  . BV (bacterial vaginosis) 03/21/2014  . Constipation 12/26/2013  . Overweight (BMI 25.0-29.9) 12/26/2013  . Sjoegren syndrome 08/09/2013  . Insomnia 09/13/2012  . GERD (gastroesophageal reflux disease) 09/18/2011  .  Anxiety 09/03/2011  . Hyperlipidemia 09/02/2011  . Fibromyalgia 09/02/2011  . Chronic nasal congestion 09/02/2011  . Vitamin D deficiency 09/02/2011  . Dysphagia 01/13/2011    Past Surgical History:  Procedure Laterality Date  . ABDOMINAL HYSTERECTOMY    . BIOPSY  09/09/2016   Procedure: BIOPSY;  Surgeon: Daneil Dolin, MD;  [facility]: [facility];  Service: Endoscopy;;  esophageal  . BREAST CYST EXCISION Right 1980   benign  . CESAREAN SECTION     x2  . CHOLECYSTECTOMY  2005    cholelithiasis  . COLONOSCOPY N/A 09/09/2016   Procedure: COLONOSCOPY;  Surgeon: Daneil Dolin, MD;  [facility]: [facility];  Service: Endoscopy;  Laterality: N/A;  8:30am  . ESOPHAGOGASTRODUODENOSCOPY  01/23/2011   LXB:WIOMBTDHRCB undulating Z-line vs short segment Barrett s/p bx (NOT BARRETT's)/small HH otherwise normal  . ESOPHAGOGASTRODUODENOSCOPY N/A 09/09/2016   Procedure: ESOPHAGOGASTRODUODENOSCOPY (EGD);  Surgeon: Daneil Dolin, MD;  [facility]: [facility];  Service: Endoscopy;  Laterality: N/A;  Venia Minks DILATION  01/23/2011   Procedure: Venia Minks DILATION;  Surgeon: Daneil Dolin, MD;  [facility];  Service: Endoscopy;  Laterality: N/A;  . Venia Minks DILATION N/A 09/09/2016   Procedure: Venia Minks DILATION;  Surgeon: Daneil Dolin, MD;  [facility]: [facility];  Service: Endoscopy;  Laterality: N/A;  . OVARIAN CYST REMOVAL  92 removal of cysts from behind ovaries  . PARTIAL HYSTERECTOMY  93  . SEPTOPLASTY     2011  . TEMPOROMANDIBULAR JOINT SURGERY    . TONSILLECTOMY     2010  . TURBINATE RESECTION Bilateral 05/29/2013   Procedure: TURBINATE RESECTION;  Surgeon: Ascencion Dike, MD;  Location: [facility];  Service: ENT;  Laterality: Bilateral;  . UPPER GASTROINTESTINAL ENDOSCOPY       OB History    Gravida  4   Para  3   Term  2   Preterm  1   AB  1   Living  3     SAB  1   TAB      Ectopic      Multiple      Live Births  3            Home Medications    Prior to Admission medications   Medication Sig Start Date End Date Taking? Authorizing Provider  pantoprazole (PROTONIX) 40 MG tablet TAKE 1 TABLET BY MOUTH EVERY DAY 08/25/17  Yes Annitta Needs, NP  Estradiol 10 MCG TABS vaginal tablet use 2 x weekly in vagina 12/27/17   Estill Dooms, NP  Fluticasone Propionate Truett Perna) 93 MCG/ACT EXHU Place 1-2 sprays into the nose 2 (two) times daily as needed. 10/12/17   Valentina Shaggy, MD  gabapentin (NEURONTIN) 300 MG capsule Take  300 mg by mouth 3 (three) times daily.    [provider]  meloxicam (MOBIC) 7.5 MG tablet Take 1 tablet (7.5 mg total) by mouth daily. Patient not taking: Reported on 11/29/2017 10/22/17   Alycia Rossetti, MD  Polyvinyl Alcohol-Povidone (REFRESH OP) Apply to eye every 3 (three) hours.    [provider]    Family History Family History  Problem Relation Age of Onset  . Hypertension Mother   . Thyroid disease Sister        overactive  . Asthma Sister   . Eczema Sister   . Bronchitis Son   . Heart disease Maternal Grandmother   . Hypertension Maternal Grandmother   . Alcohol  abuse Maternal Grandfather   . Scoliosis Son   . Other Maternal Aunt        Sjeogren syndrome  . Other Maternal Aunt        Sjoegren syndrome  . Other Maternal Aunt        Sjoegren syndrome  . Anesthesia problems Neg Hx   . Malignant hyperthermia Neg Hx   . Hypotension Neg Hx   . Pseudochol deficiency Neg Hx   . Colon cancer Neg Hx     Social History Social History   Tobacco Use  . Smoking status: Never Smoker  . Smokeless tobacco: Never Used  Substance Use Topics  . Alcohol use: No  . Drug use: No     Allergies   Hydrocodone-acetaminophen; Percocet [oxycodone-acetaminophen]; and Plaquenil [hydroxychloroquine sulfate]   Review of Systems Review of Systems  Gastrointestinal: Positive for abdominal pain.  All other systems reviewed and are negative.    Physical Exam Updated Vital Signs BP 116/74 (BP Location: Left Arm)   Pulse (!) 54   Temp (!) 97.5 F (36.4 C) (Oral)   Resp 20   Ht 5\' 5"  (1.651 m)   Wt 70.3 kg   SpO2 100%   BMI 25.79 kg/m   Physical Exam  Constitutional: She is oriented to person, place, and time. She appears well-developed and well-nourished. No distress.  HENT:  Head: Normocephalic and atraumatic.  Neck: Normal range of motion. Neck supple.  Cardiovascular: Normal rate and regular rhythm. Exam reveals no gallop and no friction rub.  No  murmur heard. Pulmonary/Chest: Effort normal and breath sounds normal. No respiratory distress. She has no wheezes.  Abdominal: Soft. Bowel sounds are normal. She exhibits no distension. There is generalized tenderness.  There is mild generalized tenderness.  Musculoskeletal: Normal range of motion.  Neurological: She is alert and oriented to person, place, and time.  Skin: Skin is warm and dry. She is not diaphoretic.  Nursing note and vitals reviewed.    ED Treatments / Results  Labs (all labs ordered are listed, but only abnormal results are displayed) Labs Reviewed  URINALYSIS, ROUTINE W REFLEX MICROSCOPIC  COMPREHENSIVE METABOLIC PANEL  LIPASE, BLOOD  CBC WITH DIFFERENTIAL/PLATELET  I-STAT BETA HCG BLOOD, ED ([facility], [facility], [facility] ONLY)    EKG None  Radiology No results found.  Procedures Procedures (including critical care time)  Medications Ordered in ED Medications  sodium chloride 0.9 % bolus 1,000 mL (has no administration in time range)     Initial Impression / Assessment and Plan / ED Course  I have reviewed the triage vital signs and the nursing notes.  Pertinent labs & imaging results that were available during my care of the patient were reviewed by me and considered in my medical decision making (see chart for details).  CT scan shows increased stool burden, however no other acute process.  She will be treated with magnesium citrate and follow-up as needed.  Final Clinical Impressions(s) / ED Diagnoses   Final diagnoses:  None    ED Discharge Orders    None       Veryl Speak, MD 01/10/18 [phone]

## 2018-01-10 NOTE — ED Triage Notes (Signed)
Pt with TMJ who states she took several Ibuprofen over the last few days and then developed constipation and had BM yesterday and saw small amount of blood in stool. Her with c/o lower abdominal pain and back pain.

## 2018-01-10 NOTE — ED Notes (Signed)
Pt to CT at this time.

## 2018-01-17 ENCOUNTER — Encounter: Payer: Self-pay | Admitting: Adult Health

## 2018-01-17 ENCOUNTER — Ambulatory Visit: Payer: BLUE CROSS/BLUE SHIELD | Admitting: Adult Health

## 2018-01-17 VITALS — BP 99/58 | HR 60 | Ht 65.0 in | Wt 158.0 lb

## 2018-01-17 DIAGNOSIS — K5900 Constipation, unspecified: Secondary | ICD-10-CM | POA: Diagnosis not present

## 2018-01-17 MED ORDER — LUBIPROSTONE 24 MCG PO CAPS: 24.0000 ug | ORAL_CAPSULE | Freq: Two times a day (BID) | ORAL | 1 refills | Status: DC

## 2018-01-17 NOTE — Progress Notes (Signed)
  Subjective:     [name], female   DOB: 17-Apr-1971, 47 y.o.   MRN: [id number]  HPI Yvette Mayer is a 47 year old black female in complaining of constipation, is eating veggies and drinking water and uses miralax at times. She wonders if it is rectocele, contributing to problem.   Review of Systems +constipation Reviewed past medical,surgical, social and family history. Reviewed medications and allergies.     Objective:   Physical Exam BP (!) 99/58 (BP Location: Left Arm, Patient Position: Sitting, Cuff Size: Normal)   Pulse 60   Ht 5\' 5"  (1.651 m)   Wt 158 lb (71.7 kg)   BMI 26.29 kg/m    Talk only, continue eating veggies and getting plenty of water and will add amitiza.  Assessment:     1. Constipation, unspecified constipation type       Plan:     Meds ordered this encounter  Medications  . lubiprostone (AMITIZA) 24 MCG capsule    Sig: Take 1 capsule (24 mcg total) by mouth 2 (two) times daily with a meal.    Dispense:  60 capsule    Refill:  1    Order Specific Question:   Supervising Provider    Answer:   Elonda Husky, LUTHER H [[id number]]  F/U in 4 weeks

## 2018-01-18 ENCOUNTER — Ambulatory Visit: Payer: BLUE CROSS/BLUE SHIELD | Admitting: Allergy & Immunology

## 2018-01-20 NOTE — Progress Notes (Deleted)
Office Visit Note  Patient: Yvette Mayer             Date of Birth: [birth year]           MRN: [id number]             PCP: Alycia Rossetti, MD Referring: Alycia Rossetti, MD Visit Date: 02/03/2018 Occupation: @GUAROCC @  Subjective:  No chief complaint on file.   History of Present Illness: Yvette Mayer is a 47 y.o. female ***   Activities of Daily Living:  Patient reports morning stiffness for *** {minute/hour:19697}.   Patient {ACTIONS;DENIES/REPORTS:21021675::"Denies"} nocturnal pain.  Difficulty dressing/grooming: {ACTIONS;DENIES/REPORTS:21021675::"Denies"} Difficulty climbing stairs: {ACTIONS;DENIES/REPORTS:21021675::"Denies"} Difficulty getting out of chair: {ACTIONS;DENIES/REPORTS:21021675::"Denies"} Difficulty using hands for taps, buttons, cutlery, and/or writing: {ACTIONS;DENIES/REPORTS:21021675::"Denies"}  No Rheumatology ROS completed.   PMFS History:  Patient Active Problem List   Diagnosis Date Noted  . History of UTI 11/29/2017  . Dyspareunia, female 11/29/2017  . Vaginal dryness 11/29/2017  . Seasonal and perennial allergic rhinitis 10/12/2017  . Adverse food reaction 10/12/2017  . Screening for colorectal cancer 10/06/2017  . Well woman exam with routine gynecological exam 10/06/2017  . Vaginal irritation 07/29/2017  . Vaginal atrophy 07/29/2017  . ANA positive Speckled 1:320 titer ENA negative  11/20/2016  . Leukopenia 11/11/2016  . History of bruising easily 09/08/2016  . Borderline high cholesterol 09/08/2016  . Situational mixed anxiety and depressive disorder 08/24/2016  . Rectocele 07/16/2016  . Kidney stone on right side 10/24/2015  . Vaginal discharge 08/08/2015  . Hot flashes 04/10/2015  . Symptoms, such as flushing, sleeplessness, headache, lack of concentration, associated with the menopause 04/10/2015  . 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 Dyspareunia 09/12/2014  . BV (bacterial vaginosis) 03/21/2014  . Constipation 12/26/2013  .  Overweight (BMI 25.0-29.9) 12/26/2013  . Sjoegren syndrome 08/09/2013  . Insomnia 09/13/2012  . GERD (gastroesophageal reflux disease) 09/18/2011  . Anxiety 09/03/2011  . Hyperlipidemia 09/02/2011  . Fibromyalgia 09/02/2011  . Chronic nasal congestion 09/02/2011  . Vitamin D deficiency 09/02/2011  . Dysphagia 01/13/2011    Past Medical History:  Diagnosis Date  . Abdominal pain 06/12/2014  . Allergy   . Anemia    iron 1 yr ago, normal hgb 7/12  . Anxiety   . Asthma    pt says no-bronchitis  . Bloated abdomen 10/15/2015  . Chronic back pain   . Chronic headaches   . Chronic neck pain   . Chronic sinusitis   . Current use of estrogen therapy 04/16/2015  . Dyspareunia 09/12/2014  . Elevated TSH 04/16/2015  . Fatigue 04/10/2015  . Fibromyalgia   . Gas 10/15/2015  . GERD (gastroesophageal reflux disease)   . Herpes simplex virus (HSV) infection   . Hot flashes 04/10/2015  . Hyperlipidemia   . Kidney stone on right side 10/24/2015  . Mental disorder   . Ovarian cyst, right   . Paresthesias   . Shortness of breath   . Sinus drainage   . Sjoegren syndrome   . Symptoms, such as flushing, sleeplessness, headache, lack of concentration, associated with the menopause 04/10/2015  . TMJ (dislocation of temporomandibular joint)   . Vaginal discharge 08/08/2015  . Vertigo   . Yeast infection 10/15/2015    Family History  Problem Relation Age of Onset  . Hypertension Mother   . Thyroid disease Sister        overactive  . Asthma Sister   . Eczema Sister   . Bronchitis Son   . Heart  disease Maternal Grandmother   . Hypertension Maternal Grandmother   . Alcohol abuse Maternal Grandfather   . Scoliosis Son   . Other Maternal Aunt        Sjeogren syndrome  . Other Maternal Aunt        Sjoegren syndrome  . Other Maternal Aunt        Sjoegren syndrome  . Anesthesia problems Neg Hx   . Malignant hyperthermia Neg Hx   . Hypotension Neg Hx   . Pseudochol deficiency Neg Hx   . Colon  cancer Neg Hx    Past Surgical History:  Procedure Laterality Date  . ABDOMINAL HYSTERECTOMY    . BIOPSY  09/09/2016   Procedure: BIOPSY;  Surgeon: Daneil Dolin, MD;  [facility];  Service: Endoscopy;;  esophageal  . BREAST CYST EXCISION Right 1980   benign  . CESAREAN SECTION     x2  . CHOLECYSTECTOMY  2005   cholelithiasis  . COLONOSCOPY N/A 09/09/2016   Procedure: COLONOSCOPY;  Surgeon: Daneil Dolin, MD;  [facility];  Service: Endoscopy;  Laterality: N/A;  8:30am  . ESOPHAGOGASTRODUODENOSCOPY  01/23/2011   OIZ:[id number] undulating Z-line vs short segment Barrett s/p bx (NOT BARRETT's)/small HH otherwise normal  . ESOPHAGOGASTRODUODENOSCOPY N/A 09/09/2016   Procedure: ESOPHAGOGASTRODUODENOSCOPY (EGD);  Surgeon: Daneil Dolin, MD;  [facility];  Service: Endoscopy;  Laterality: N/A;  Venia Minks DILATION  01/23/2011   Procedure: Venia Minks DILATION;  Surgeon: Daneil Dolin, MD;  [facility];  Service: Endoscopy;  Laterality: N/A;  . Venia Minks DILATION N/A 09/09/2016   Procedure: Venia Minks DILATION;  Surgeon: Daneil Dolin, MD;  [facility];  Service: Endoscopy;  Laterality: N/A;  . OVARIAN CYST REMOVAL  92 removal of cysts from behind ovaries  . PARTIAL HYSTERECTOMY  93  . SEPTOPLASTY     2011  . TEMPOROMANDIBULAR JOINT SURGERY    . TONSILLECTOMY     2010  . TURBINATE RESECTION Bilateral 05/29/2013   Procedure: TURBINATE RESECTION;  Surgeon: Ascencion Dike, MD;  Location: [facility];  Service: ENT;  Laterality: Bilateral;  . UPPER GASTROINTESTINAL ENDOSCOPY     Social History   Social History Narrative  . Not on file    Objective: Vital Signs: There were no vitals taken for this visit.   Physical Exam   Musculoskeletal Exam: ***  CDAI Exam: CDAI Score: Not documented Patient Global Assessment: Not documented; Provider Global Assessment: Not documented Swollen: Not documented; Tender: Not  documented Joint Exam   Not documented   There is currently no information documented on the homunculus. Go to the Rheumatology activity and complete the homunculus joint exam.  Investigation: No additional findings.  Imaging: Ct Abdomen Pelvis W Contrast  Result Date: 01/10/2018 CLINICAL DATA:  Constipation and low abdominal pain. Blood in stool for 1 day EXAM: CT ABDOMEN AND PELVIS WITH CONTRAST TECHNIQUE: Multidetector CT imaging of the abdomen and pelvis was performed using the standard protocol following bolus administration of intravenous contrast. CONTRAST:  175mL ISOVUE-300 IOPAMIDOL (ISOVUE-300) INJECTION 61% COMPARISON:  April 11, 2015 FINDINGS: Lower chest: There is mild bibasilar lung atelectatic change. Hepatobiliary: No focal liver lesions are apparent. Gallbladder is absent. There is no biliary duct dilatation. Pancreas: No pancreatic mass or inflammatory focus. Spleen: No splenic lesions are evident. Adrenals/Urinary Tract: Adrenals bilaterally appear unremarkable. There is a 5 mm probable cyst in the lower pole of the right kidney. There is no appreciable  hydronephrosis on either side. There is no evident renal or ureteral calculus on either side. Urinary bladder is midline with wall thickness within normal limits. Stomach/Bowel: There is moderate stool throughout the colon. There are sigmoid diverticula without diverticulitis. There is no appreciable bowel wall or mesenteric thickening. No evident bowel obstruction or free air. Vascular/Lymphatic: There is no abdominal aortic aneurysm. No vascular lesion is appreciable. There is no adenopathy in the abdomen or pelvis. Reproductive: Uterus absent. No evident pelvic mass. A small amount of free fluid is noted in the rightward aspect of the dependent portion of the pelvis. Other: There is no periappendiceal region inflammation. There is no abscess in the abdomen or pelvis. No ascites evident beyond the mild free fluid in the rightward  dependent pelvis. Musculoskeletal: There are no blastic or lytic bone lesions. There is no intramuscular or abdominal wall lesion. IMPRESSION: 1. Moderate stool in colon. Sigmoid diverticula without diverticulitis. No bowel wall thickening or bowel obstruction. No evident abscess in the abdomen pelvis. No periappendiceal region lesion evident. 2. Small amount of free fluid in the rightward aspect of the dependent portion of the pelvis. Suspect recent ovarian cyst rupture. Uterus absent. 3.  Gallbladder absent. 4.  No evident renal or ureteral calculus.  No hydronephrosis. Electronically Signed   By: Lowella Grip III M.D.   On: 01/10/2018 07:12    Recent Labs: Lab Results  Component Value Date   WBC 3.2 (L) 01/10/2018   HGB 13.5 01/10/2018   PLT 303 01/10/2018   NA 140 01/10/2018   K 3.6 01/10/2018   CL 106 01/10/2018   CO2 29 01/10/2018   GLUCOSE 93 01/10/2018   BUN 12 01/10/2018   CREATININE 0.79 01/10/2018   BILITOT 0.8 01/10/2018   ALKPHOS 73 01/10/2018   AST 21 01/10/2018   ALT 16 01/10/2018   PROT 7.2 01/10/2018   ALBUMIN 3.7 01/10/2018   CALCIUM 9.1 01/10/2018   GFRAA >60 01/10/2018    Speciality Comments: No specialty comments available.  Procedures:  No procedures performed Allergies: Hydrocodone-acetaminophen; Percocet [oxycodone-acetaminophen]; and Plaquenil [hydroxychloroquine sulfate]   Assessment / Plan:     Visit Diagnoses: No diagnosis found.   Orders: No orders of the defined types were placed in this encounter.  No orders of the defined types were placed in this encounter.   Face-to-face time spent with patient was *** minutes. Greater than 50% of time was spent in counseling and coordination of care.  Follow-Up Instructions: No follow-ups on file.   Earnestine Mealing, CMA  Note - This record has been created using Editor, commissioning.  Chart creation errors have been sought, but may not always  have been located. Such creation errors do not reflect  on  the standard of medical care.

## 2018-01-21 ENCOUNTER — Ambulatory Visit: Payer: BLUE CROSS/BLUE SHIELD | Admitting: Family Medicine

## 2018-01-25 ENCOUNTER — Telehealth: Payer: Self-pay | Admitting: *Deleted

## 2018-01-25 NOTE — Telephone Encounter (Signed)
Patient informed to try Amitiza.  Verbalized understanding.

## 2018-01-25 NOTE — Telephone Encounter (Signed)
Patient states she is wanting Linzess not Amitiza as she has taken Linzess before. Please advise.

## 2018-02-01 ENCOUNTER — Ambulatory Visit: Payer: BLUE CROSS/BLUE SHIELD | Admitting: Allergy & Immunology

## 2018-02-03 ENCOUNTER — Ambulatory Visit: Payer: BLUE CROSS/BLUE SHIELD | Admitting: Rheumatology

## 2018-02-08 IMAGING — DX DG ABDOMEN 2V
3 series · 3 of 3 positions shown · non-contrast
Comparison: CT 04/11/2015

CLINICAL DATA: Abdominal pain and bloating. Constipation. Several
abdominal surgeries.

EXAM:
ABDOMEN - 2 VIEW

[abdomen erect]
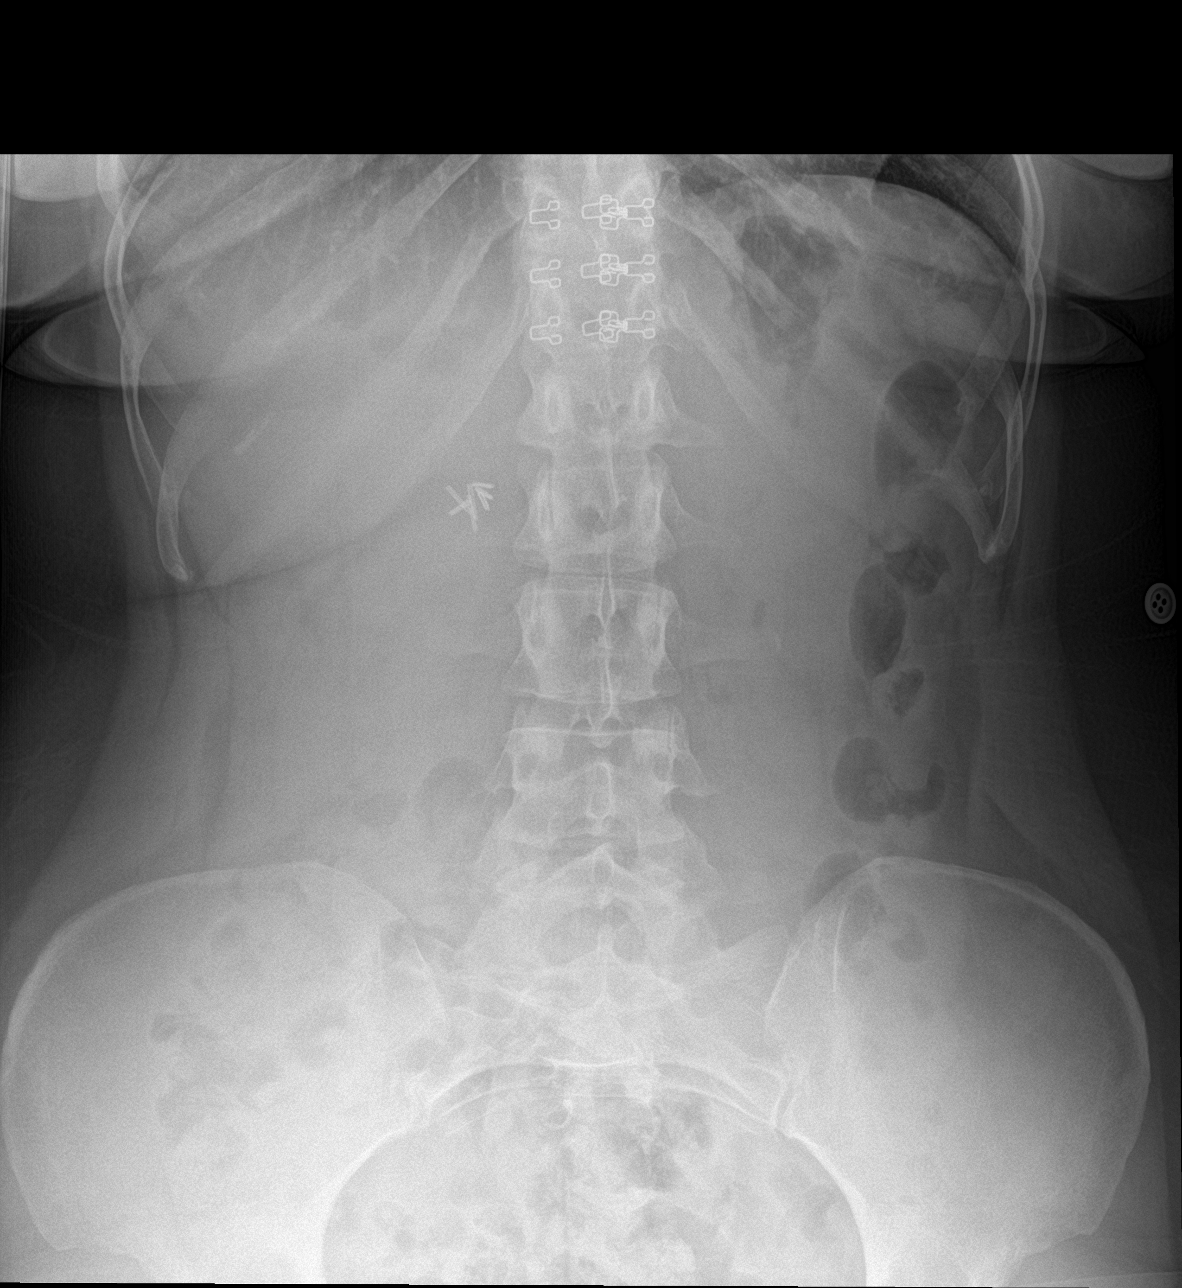

[abdomen supine (1 of 2)]
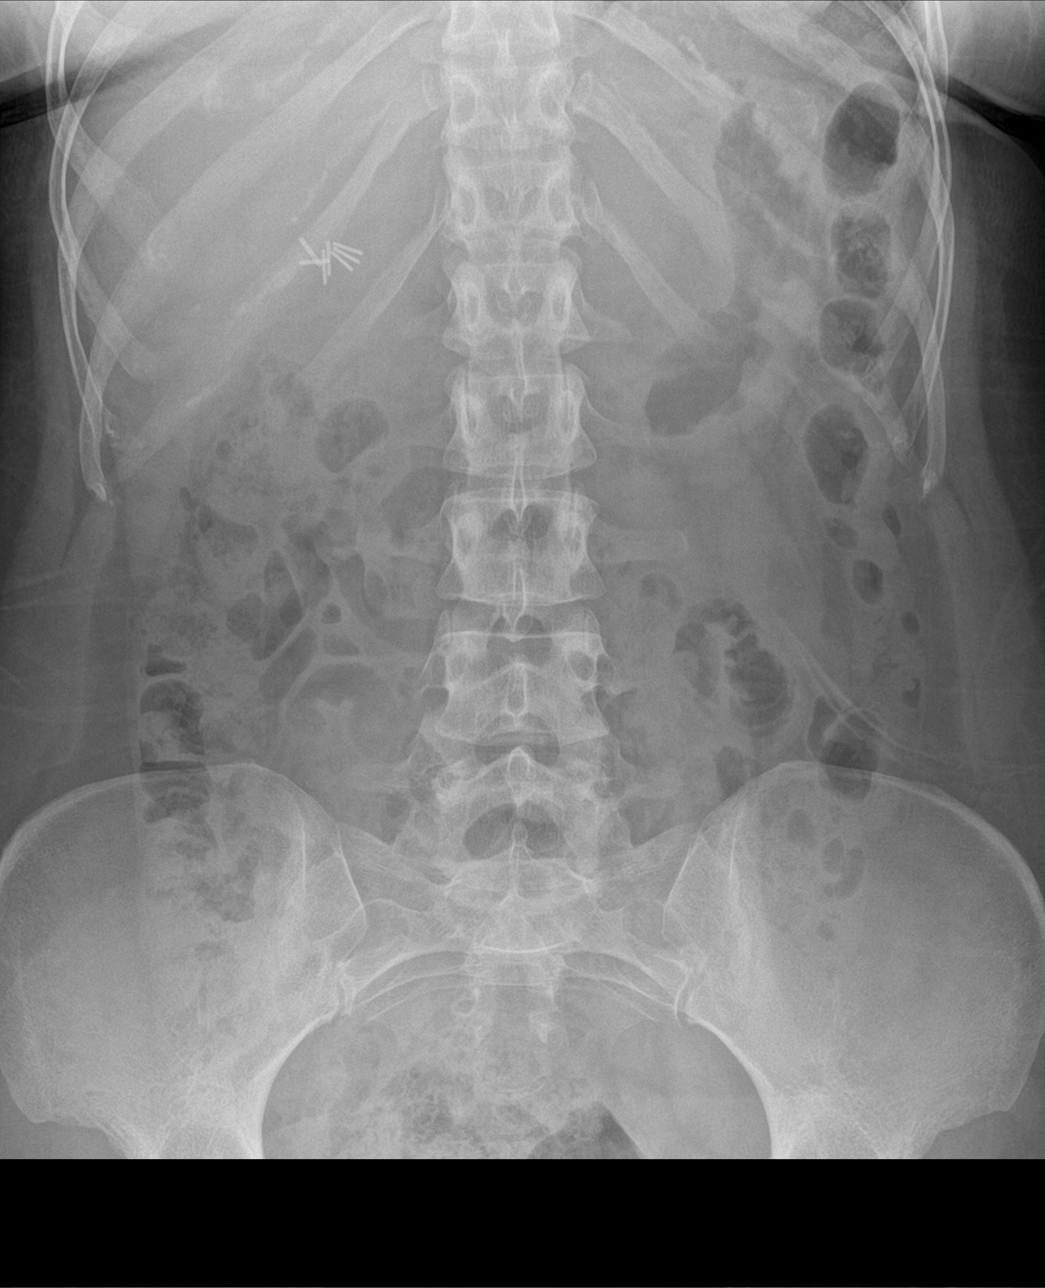

[abdomen supine (2 of 2)]
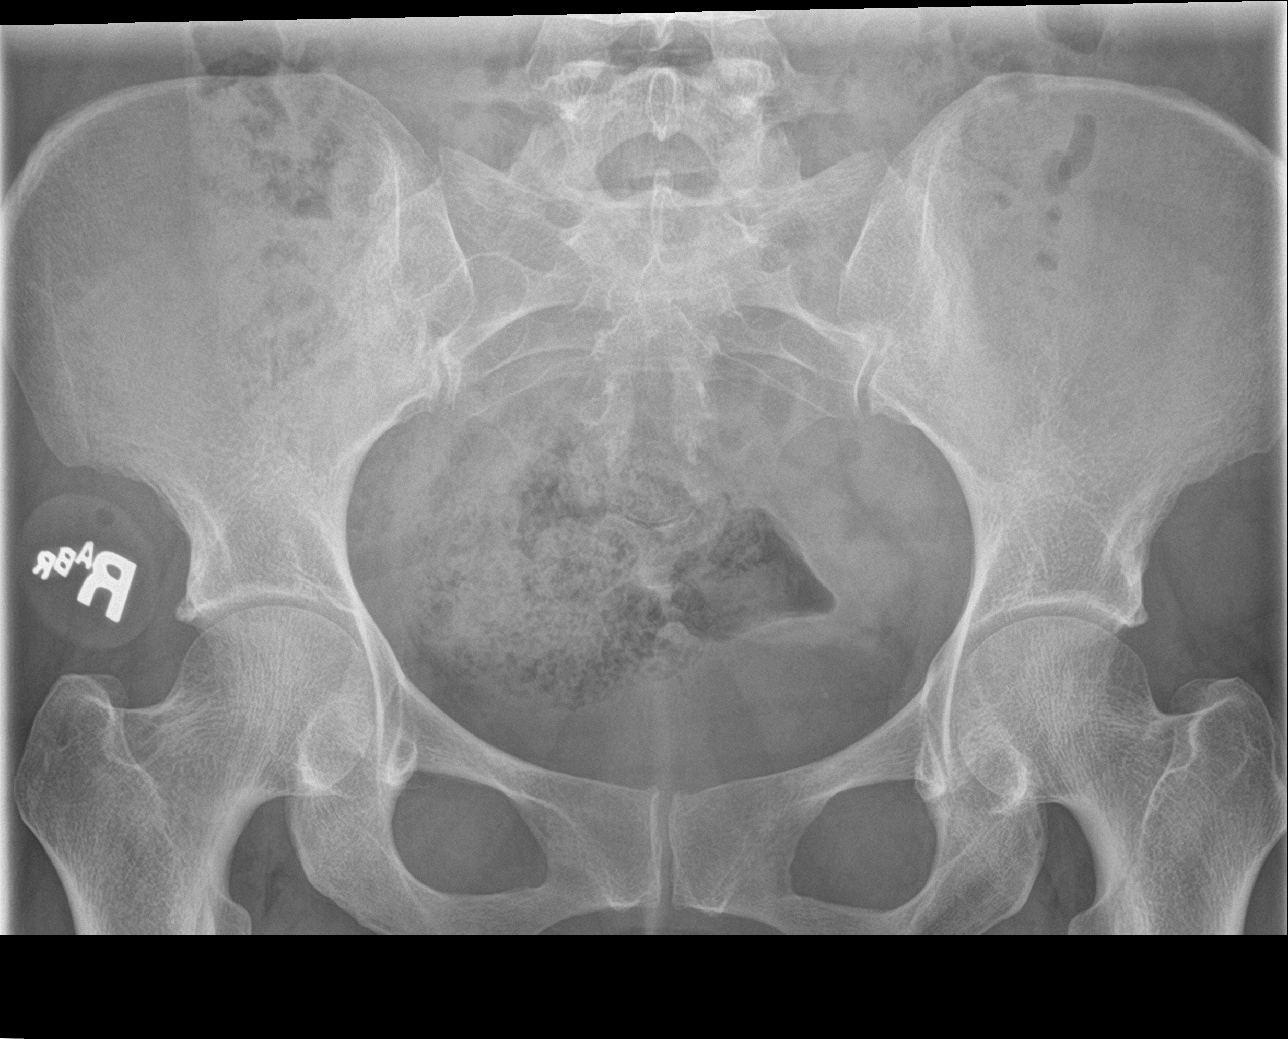

[3 of 3 positions shown; findings below may reference images not displayed]

FINDINGS: No dilated bowel loops to suggest obstruction. Moderate colonic
stool in the pelvis, may be cecum or sigmoid colon. Small volume of
stool elsewhere. No free intra-abdominal air. Cholecystectomy clips
in the right upper quadrant of the abdomen. No abnormal soft tissue
calcifications, concerning intra-abdominal mass effect, or evidence
of organomegaly. No osseous abnormalities. Lower most lung bases are
clear.
IMPRESSION: No bowel obstruction or free air. Moderate stool burden in the
pelvis.

## 2018-02-14 ENCOUNTER — Ambulatory Visit: Payer: BLUE CROSS/BLUE SHIELD | Admitting: Adult Health

## 2018-02-18 ENCOUNTER — Other Ambulatory Visit: Payer: Self-pay

## 2018-02-18 ENCOUNTER — Encounter: Payer: Self-pay | Admitting: Family Medicine

## 2018-02-18 ENCOUNTER — Ambulatory Visit: Payer: BLUE CROSS/BLUE SHIELD | Admitting: Family Medicine

## 2018-02-18 VITALS — BP 100/64 | HR 62 | Temp 98.1°F | Resp 18 | Ht 65.0 in | Wt 157.0 lb

## 2018-02-18 DIAGNOSIS — M545 Low back pain, unspecified: Secondary | ICD-10-CM

## 2018-02-18 DIAGNOSIS — K589 Irritable bowel syndrome without diarrhea: Secondary | ICD-10-CM

## 2018-02-18 DIAGNOSIS — N898 Other specified noninflammatory disorders of vagina: Secondary | ICD-10-CM | POA: Diagnosis not present

## 2018-02-18 LAB — URINALYSIS, ROUTINE W REFLEX MICROSCOPIC
BACTERIA UA: NONE SEEN /HPF
Bilirubin Urine: NEGATIVE
Glucose, UA: NEGATIVE
KETONES UR: NEGATIVE
Nitrite: NEGATIVE
PH: 5.5 (ref 5.0–8.0)
Protein, ur: NEGATIVE
SPECIFIC GRAVITY, URINE: 1.01 (ref 1.001–1.03)

## 2018-02-18 LAB — MICROSCOPIC MESSAGE

## 2018-02-18 LAB — WET PREP FOR TRICH, YEAST, CLUE

## 2018-02-18 MED ORDER — FLUCONAZOLE 150 MG PO TABS: 150.0000 mg | ORAL_TABLET | Freq: Once | ORAL | 0 refills | Status: AC

## 2018-02-18 NOTE — Progress Notes (Signed)
[name], female    DOB: December 01, 1970, 47 y.o.   MRN: [id number]  PCP: Yvette Rossetti, MD  Chief Complaint  Patient presents with  . Vaginal Irritation    itching and discharge  . Stool Color Changes    ENT changed reflux meds to pepcid and now has green colored stool with loose stools    Subjective:   Yvette Mayer is a 47 y.o. female, presents to clinic with CC of 2 to 3 days of thick white vaginal discharge with vaginal irritation and itching.  She has not noticed any odor to the discharge, denies any genital sores, lesions, swelling or redness.  She denies any urinary changes, no dysuria, hematuria, urinary frequency urgency, bladder pressure, flank pain.  She has a history of IBS and has chronic achiness in her bowels and generally across her lower abdomen.  She is status post hysterectomy, also history of atrophic vaginitis, is not sexually active.   She also complains that this last week her stool has been softer and has changed color to green.  This occurred after her ENT change her acid reflux medicine to Prevacid.  Bowel changes are not noticeably different from her history of IBS.  Denies any other foods or drinks with blue-green dye in them.  She denies any fever, chills, nausea, vomiting. She also is concerned about a knot that she feels in her left low back that is tender to the touch.  No skin changes, redness or swelling there.   Patient Active Problem List   Diagnosis Date Noted  . History of UTI 11/29/2017  . Dyspareunia, female 11/29/2017  . Vaginal dryness 11/29/2017  . Seasonal and perennial allergic rhinitis 10/12/2017  . Adverse food reaction 10/12/2017  . Screening for colorectal cancer 10/06/2017  . Well woman exam with routine gynecological exam 10/06/2017  . Vaginal irritation 07/29/2017  . Vaginal atrophy 07/29/2017  . ANA positive Speckled 1:320 titer ENA negative  11/20/2016  . Leukopenia 11/11/2016  . History of bruising easily  09/08/2016  . Borderline high cholesterol 09/08/2016  . Situational mixed anxiety and depressive disorder 08/24/2016  . Rectocele 07/16/2016  . Kidney stone on right side 10/24/2015  . Vaginal discharge 08/08/2015  . Hot flashes 04/10/2015  . Symptoms, such as flushing, sleeplessness, headache, lack of concentration, associated with the menopause 04/10/2015  . 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 Dyspareunia 09/12/2014  . BV (bacterial vaginosis) 03/21/2014  . Constipation 12/26/2013  . Overweight (BMI 25.0-29.9) 12/26/2013  . Sjoegren syndrome 08/09/2013  . Insomnia 09/13/2012  . GERD (gastroesophageal reflux disease) 09/18/2011  . Anxiety 09/03/2011  . Hyperlipidemia 09/02/2011  . Fibromyalgia 09/02/2011  . Chronic nasal congestion 09/02/2011  . Vitamin D deficiency 09/02/2011  . Dysphagia 01/13/2011     Prior to Admission medications   Medication Sig Start Date End Date Taking? Authorizing Provider  Estradiol 10 MCG TABS vaginal tablet use 2 x weekly in vagina 12/27/17  Yes Estill Dooms, NP  Fluticasone Propionate (XHANCE) 93 MCG/ACT EXHU Place 1-2 sprays into the nose 2 (two) times daily as needed. 10/12/17  Yes Valentina Shaggy, MD  gabapentin (NEURONTIN) 300 MG capsule Take 300 mg by mouth 3 (three) times daily.   Yes [provider]  lansoprazole (PREVACID) 30 MG capsule TAKE 1 CAPSULE BY MOUTH ONCE DAILY FOR 30 DAYS 02/08/18  Yes [provider]  lubiprostone (AMITIZA) 24 MCG capsule Take 1 capsule (24 mcg total) by mouth 2 (two) times  daily with a meal. 01/17/18  Yes Derrek Monaco A, NP  meloxicam (MOBIC) 7.5 MG tablet Take 1 tablet (7.5 mg total) by mouth daily. 10/22/17  Yes [place], Modena Nunnery, MD  pantoprazole (PROTONIX) 40 MG tablet TAKE 1 TABLET BY MOUTH EVERY DAY 08/25/17  Yes Annitta Needs, NP  Polyvinyl Alcohol-Povidone (REFRESH OP) Apply to eye every 3 (three) hours.   Yes [provider]     Allergies  Allergen Reactions  .  Hydrocodone-Acetaminophen Itching  . Percocet [Oxycodone-Acetaminophen] Itching  . Plaquenil [Hydroxychloroquine Sulfate] Rash     Family History  Problem Relation Age of Onset  . Hypertension Mother   . Thyroid disease Sister        overactive  . Asthma Sister   . Eczema Sister   . Bronchitis Son   . Heart disease Maternal Grandmother   . Hypertension Maternal Grandmother   . Alcohol abuse Maternal Grandfather   . Scoliosis Son   . Other Maternal Aunt        Sjeogren syndrome  . Other Maternal Aunt        Sjoegren syndrome  . Other Maternal Aunt        Sjoegren syndrome  . Anesthesia problems Neg Hx   . Malignant hyperthermia Neg Hx   . Hypotension Neg Hx   . Pseudochol deficiency Neg Hx   . Colon cancer Neg Hx         Review of Systems     Objective:    Vitals:   02/18/18 0902  BP: 100/64  Pulse: 62  Resp: 18  Temp: 98.1 F (36.7 C)  TempSrc: Oral  SpO2: 96%  Weight: 157 lb (71.2 kg)  Height: 5\' 5"  (1.651 m)      Physical Exam  Constitutional: She appears well-developed and well-nourished. No distress.  HENT:  Head: Normocephalic and atraumatic.  Nose: Nose normal.  Mouth/Throat: Oropharynx is clear and moist.  Eyes: Conjunctivae are normal. Right eye exhibits no discharge. Left eye exhibits no discharge.  Neck: No tracheal deviation present.  Cardiovascular: Normal rate and regular rhythm.  Pulmonary/Chest: Effort normal. No stridor. No respiratory distress.  Abdominal:  Abdomen soft, nondistended, normal bowel sounds x4, no PA tenderness bilaterally, mild generalized tenderness to palpation to lower abdomen to the right and left lower quadrant and suprapubic area without guarding or rebound  Musculoskeletal: Normal range of motion. She exhibits no edema or deformity.  Left lumbar paraspinal muscle mild tenderness to palpation without spasms or tension, no midline tenderness from cervical to lumbar spine, normal range of motion of back Normal  sensation to light touch in bilateral lower extremities Gait 5 out of 5 strength with hip flexion, bilateral dorsiflexion and plantarflexion  Neurological: She is alert. She exhibits normal muscle tone. Coordination normal.  Skin: Skin is warm. No rash noted. She is not diaphoretic.  Psychiatric: She has a normal mood and affect. Her behavior is normal.  Nursing note and vitals reviewed.     Wet prep obtained a vaginal swab and examination of genitals and vaginal opening Vagina is atrophic, no swelling, rash, lesions, erythema.  No discharge present but malodorous    Assessment & Plan:      ICD-10-CM   1. Vaginal irritation N89.8 WET PREP FOR TRICH, YEAST, CLUE    Urinalysis, Routine w reflex microscopic   Differential is BV, yeast vaginitis, atrophic vaginitis - odor on exam concerning for BV - wet prep pending, UA screening  2. Tenderness of left lumbar  region M54.5    Mild left paraspinal muscle tenderness  3. Irritable bowel syndrome, unspecified type K58.9    Stool color change to green after medication change for GERD, no change to chronic abdominal tenderness and alternating bowel changes  Treat vaginitis based on wet prep results - pt has not yet left urine sample - pending  Mild low back muscle tenderness -gentle stretching, ice and heat therapy, NSAIDs  IBS -patient encouraged to return to clinic if she has persistent color change in stool or loose diarrhea with any increasing abdominal pain for 2 to 3 weeks and we will test her stool  Delsa Grana, PA-C 02/18/18 9:22 AM

## 2018-02-18 NOTE — Patient Instructions (Addendum)
Will try one dose of yeast medicine.  Use your medicine for atrophic vaginitis.    Let us know if your symptoms don't improve.    The swab did not show anything that gives a specific diagnosis.  Return for stool if you have several weeks of diarrhea that is different from your IBS, or if you have new fever, sweats, weightloss, severe abdominal pain.  Take aleve or naproxen, apply ice and/or heat, gently stretch, avoid any heavy lifting.  Back Pain, Adult Back pain is very common. The pain often gets better over time. The cause of back pain is usually not dangerous. Most people can learn to manage their back pain on their own. Follow these instructions at home: Watch your back pain for any changes. The following actions may help to lessen any pain you are feeling:  Stay active. Start with short walks on flat ground if you can. Try to walk farther each day.  Exercise regularly as told by your doctor. Exercise helps your back heal faster. It also helps avoid future injury by keeping your muscles strong and flexible.  Do not sit, drive, or stand in one place for more than 30 minutes.  Do not stay in bed. Resting more than 1-2 days can slow down your recovery.  Be careful when you bend or lift an object. Use good form when lifting: ? Bend at your knees. ? Keep the object close to your body. ? Do not twist.  Sleep on a firm mattress. Lie on your side, and bend your knees. If you lie on your back, put a pillow under your knees.  Take medicines only as told by your doctor.  Put ice on the injured area. ? Put ice in a plastic bag. ? Place a towel between your skin and the bag. ? Leave the ice on for 20 minutes, 2-3 times a day for the first 2-3 days. After that, you can switch between ice and heat packs.  Avoid feeling anxious or stressed. Find good ways to deal with stress, such as exercise.  Maintain a healthy weight. Extra weight puts stress on your back.  Contact a doctor  if:  You have pain that does not go away with rest or medicine.  You have worsening pain that goes down into your legs or buttocks.  You have pain that does not get better in one week.  You have pain at night.  You lose weight.  You have a fever or chills. Get help right away if:  You cannot control when you poop (bowel movement) or pee (urinate).  Your arms or legs feel weak.  Your arms or legs lose feeling (numbness).  You feel sick to your stomach (nauseous) or throw up (vomit).  You have belly (abdominal) pain.  You feel like you may pass out (faint). This information is not intended to replace advice given to you by your health care provider. Make sure you discuss any questions you have with your health care provider. Document Released: 11/04/2007 Document Revised: 10/24/2015 Document Reviewed: 09/19/2013 Elsevier Interactive Patient Education  [employer].

## 2018-02-28 ENCOUNTER — Telehealth: Payer: Self-pay | Admitting: Family Medicine

## 2018-02-28 NOTE — Telephone Encounter (Signed)
Pt lvmtrc, did not leave reason for call.

## 2018-03-02 ENCOUNTER — Other Ambulatory Visit: Payer: Self-pay

## 2018-03-02 ENCOUNTER — Ambulatory Visit: Payer: BLUE CROSS/BLUE SHIELD | Admitting: Nurse Practitioner

## 2018-03-02 ENCOUNTER — Encounter: Payer: Self-pay | Admitting: Nurse Practitioner

## 2018-03-02 ENCOUNTER — Telehealth: Payer: Self-pay

## 2018-03-02 VITALS — BP 96/57 | HR 61 | Temp 97.4°F | Ht 65.0 in | Wt 156.0 lb

## 2018-03-02 DIAGNOSIS — K219 Gastro-esophageal reflux disease without esophagitis: Secondary | ICD-10-CM

## 2018-03-02 DIAGNOSIS — R1310 Dysphagia, unspecified: Secondary | ICD-10-CM

## 2018-03-02 DIAGNOSIS — R1084 Generalized abdominal pain: Secondary | ICD-10-CM | POA: Diagnosis not present

## 2018-03-02 DIAGNOSIS — R109 Unspecified abdominal pain: Secondary | ICD-10-CM | POA: Insufficient documentation

## 2018-03-02 MED ORDER — LINACLOTIDE 72 MCG PO CAPS: 72.0000 ug | ORAL_CAPSULE | Freq: Every day | ORAL | 2 refills | Status: DC

## 2018-03-02 NOTE — Patient Instructions (Signed)
1. I have sent a refill of Linzess to your pharmacy. 2. Continue your other medications. 3. We will schedule your procedure for you. 4. Return for follow-up in 4 months. 5. Call us if you have any questions or concerns.  At [facility] we value your feedback. You may receive a survey about your visit today. Please share your experience as we strive to create trusting relationships with our patients to provide genuine, compassionate, quality care.  We appreciate your understanding and patience as we review any laboratory studies, imaging, and other diagnostic tests that are ordered as we care for you. Our office policy is 5 business days for review of these results, and any emergent or urgent results are addressed in a timely manner for your best interest. If you do not hear from our office in 1 week, please contact us.   We also encourage the use of MyChart, which contains your medical information for your review as well. If you are not enrolled in this feature, an access code is on this after visit summary for your convenience. Thank you for allowing [place] to be involved in your care.  It was great to meet you today!  I hope you have a great Fall!!

## 2018-03-02 NOTE — Telephone Encounter (Signed)
Called and informed pt of pre-op 03/25/18 at 9:00am. Letter mailed.

## 2018-03-02 NOTE — Progress Notes (Signed)
Referring Provider: Alycia Rossetti, MD Primary Care Physician:  Alycia Rossetti, MD Primary GI:  Dr. Gala Romney  Chief Complaint  Patient presents with  . Dysphagia    feels like food is getting stuck in her chest. happens with all foods  . Constipation    HPI:   Yvette Mayer is a 47 y.o. female who presents for constipation and dysphagia.  The patient was last seen in our office 01/27/2017 for constipation and rectal itching.  Noted chronic history of constipation, GERD, dysphasia.  EGD in 2012 with accentuated undulation Z line but biopsy negative for Barrett's.  Diagnosed with Sjogren's syndrome in 2013.  Has undergone "colon cleansing" in the past.  Updated EGD in 2018 which found distal esophagus query short segment Barrett's status post biopsy, status post Maloney dilation.  Surgical pathology found the biopsies to be benign gastric mucosa with slight chronic irritation.  Recommended stop Pepcid and begin Protonix 40 mg daily.  Colonoscopy updated the same day and recommended 10-year repeat (2028).  At her last visit she was doing okay, abdominal pain with CT scans unremarkable.  Rectal itching and thinks she may have parasites and feels she has a lot of symptoms of parasites based on Internet reading.  Her abdominal pain is lower abdomen, crampy, constant with bloating and excessive flatus.  Nothing improves her pain including pain medication and nothing makes it worse.  Bowel movement once a day, some days consistent with [facility] 4 but lately has been more consistent with [facility] 2 and require straining.  No other GI symptoms.  Recommended Linzess 290 mcg daily, samples for 2 weeks, call with progress report, stool studies for ova and parasites, follow-up in 3 months.  Her ova and parasite exam completed 02/02/2017 which was negative.  Recommend she continue her current medications and keep her follow-up.  She did not show for her follow-up exam.  However, in 11/30/2017 she called  complaining of rectal itching wanting a parasite exam.  Stool O&P was ordered and was collected 12/14/2017.  This was, again, negative.  Today she states she's doing well overall. She's been having dysphagia symptoms since 1-2 months ago. Has no back teeth and working on getting partials. Rare regurgitation. Unable to identify specific triggers. Occurs 2-3 times a week. Denies N/V. Saw ENT and diagnosed with GERD, changed her from Protonix to lansoprazole and ranitidine. Also with constipation which is chronic. No worse, but persistent. Depends on what she eats. Stools vary from 1-4. Has straining at times. Diagnosed with rectocele by OB/GYN. Only takes "some pill I take twice a day or something" (likely Amitiza) for constipation, was previously on Linzess. She is not really taking Amitiza (took it for a week, doesn't think she took it long enough to know if it is effective). Linzess worked well when she took it (only took it for a month). "I don't like to take medication." Has a bowel movement about 3 times a week, occasional incomplete emptying. Denies chest pain, dyspnea, dizziness, lightheadedness, syncope, near syncope. Denies any other upper or lower GI symptoms.  Past Medical History:  Diagnosis Date  . Abdominal pain 06/12/2014  . Allergy   . Anemia    iron 1 yr ago, normal hgb 7/12  . Anxiety   . Asthma    pt says no-bronchitis  . Bloated abdomen 10/15/2015  . Chronic back pain   . Chronic headaches   . Chronic neck pain   . Chronic sinusitis   .  Current use of estrogen therapy 04/16/2015  . Dyspareunia 09/12/2014  . Elevated TSH 04/16/2015  . Fatigue 04/10/2015  . Fibromyalgia   . Gas 10/15/2015  . GERD (gastroesophageal reflux disease)   . Herpes simplex virus (HSV) infection   . Hot flashes 04/10/2015  . Hyperlipidemia   . Kidney stone on right side 10/24/2015  . Mental disorder   . Ovarian cyst, right   . Paresthesias   . Shortness of breath   . Sinus drainage   . Sjoegren  syndrome   . Symptoms, such as flushing, sleeplessness, headache, lack of concentration, associated with the menopause 04/10/2015  . TMJ (dislocation of temporomandibular joint)   . Vaginal discharge 08/08/2015  . Vertigo   . Yeast infection 10/15/2015    Past Surgical History:  Procedure Laterality Date  . ABDOMINAL HYSTERECTOMY    . BIOPSY  09/09/2016   Procedure: BIOPSY;  Surgeon: Daneil Dolin, MD;  Location: [facility];  Service: Endoscopy;;  esophageal  . BREAST CYST EXCISION Right 1980   benign  . CESAREAN SECTION     x2  . CHOLECYSTECTOMY  2005   cholelithiasis  . COLONOSCOPY N/A 09/09/2016   Procedure: COLONOSCOPY;  Surgeon: Daneil Dolin, MD;  Location: [facility];  Service: Endoscopy;  Laterality: N/A;  8:30am  . ESOPHAGOGASTRODUODENOSCOPY  01/23/2011   LGX:QJJHERDEYCX undulating Z-line vs short segment Barrett s/p bx (NOT BARRETT's)/small HH otherwise normal  . ESOPHAGOGASTRODUODENOSCOPY N/A 09/09/2016   Procedure: ESOPHAGOGASTRODUODENOSCOPY (EGD);  Surgeon: Daneil Dolin, MD;  Location: [facility];  Service: Endoscopy;  Laterality: N/A;  Venia Minks DILATION  01/23/2011   Procedure: Venia Minks DILATION;  Surgeon: Daneil Dolin, MD;  Location: [facility];  Service: Endoscopy;  Laterality: N/A;  . Venia Minks DILATION N/A 09/09/2016   Procedure: Venia Minks DILATION;  Surgeon: Daneil Dolin, MD;  Location: [facility];  Service: Endoscopy;  Laterality: N/A;  . OVARIAN CYST REMOVAL  92 removal of cysts from behind ovaries  . PARTIAL HYSTERECTOMY  93  . SEPTOPLASTY     2011  . TEMPOROMANDIBULAR JOINT SURGERY    . TONSILLECTOMY     2010  . TURBINATE RESECTION Bilateral 05/29/2013   Procedure: TURBINATE RESECTION;  Surgeon: Ascencion Dike, MD;  Location: [facility];  Service: ENT;  Laterality: Bilateral;  . UPPER GASTROINTESTINAL ENDOSCOPY      Current Outpatient Medications  Medication Sig Dispense Refill  . Fluticasone Propionate (XHANCE) 93 MCG/ACT EXHU  Place 1-2 sprays into the nose 2 (two) times daily as needed. 32 mL 5  . lansoprazole (PREVACID) 30 MG capsule TAKE 1 CAPSULE BY MOUTH ONCE DAILY FOR 30 DAYS  5  . Polyvinyl Alcohol-Povidone (REFRESH OP) Apply to eye every 3 (three) hours.    . ranitidine (ZANTAC) 150 MG tablet Take 150 mg by mouth at bedtime.    Marland Kitchen linaclotide (LINZESS) 72 MCG capsule Take 1 capsule (72 mcg total) by mouth daily before breakfast. 30 capsule 2  . lubiprostone (AMITIZA) 24 MCG capsule Take 1 capsule (24 mcg total) by mouth 2 (two) times daily with a meal. (Patient not taking: Reported on 03/02/2018) 60 capsule 1   No current facility-administered medications for this visit.     Allergies as of 03/02/2018 - Review Complete 03/02/2018  Allergen Reaction Noted  . Hydrocodone-acetaminophen Itching 01/13/2011  . Percocet [oxycodone-acetaminophen] Itching 01/13/2011  . Plaquenil [hydroxychloroquine sulfate] Rash 04/16/2015    Family History  Problem Relation Age of Onset  .  Hypertension Mother   . Thyroid disease Sister        overactive  . Asthma Sister   . Eczema Sister   . Bronchitis Son   . Heart disease Maternal Grandmother   . Hypertension Maternal Grandmother   . Alcohol abuse Maternal Grandfather   . Scoliosis Son   . Other Maternal Aunt        Sjeogren syndrome  . Other Maternal Aunt        Sjoegren syndrome  . Other Maternal Aunt        Sjoegren syndrome  . Anesthesia problems Neg Hx   . Malignant hyperthermia Neg Hx   . Hypotension Neg Hx   . Pseudochol deficiency Neg Hx   . Colon cancer Neg Hx     Social History   Socioeconomic History  . Marital status: Single    Spouse name: Not on file  . Number of children: 3  . Years of education: Not on file  . Highest education level: Not on file  Occupational History  . Occupation: Packer Transport planner: [employer]  Social Needs  . Financial resource strain: Not on file  . Food insecurity:    Worry: Not on file    Inability: Not  on file  . Transportation needs:    Medical: Not on file    Non-medical: Not on file  Tobacco Use  . Smoking status: Never Smoker  . Smokeless tobacco: Never Used  Substance and Sexual Activity  . Alcohol use: No  . Drug use: No  . Sexual activity: Not Currently    Birth control/protection: Surgical    Comment: hyst  Lifestyle  . Physical activity:    Days per week: Not on file    Minutes per session: Not on file  . Stress: Not on file  Relationships  . Social connections:    Talks on phone: Not on file    Gets together: Not on file    Attends religious service: Not on file    Active member of club or organization: Not on file    Attends meetings of clubs or organizations: Not on file    Relationship status: Not on file  Other Topics Concern  . Not on file  Social History Narrative  . Not on file    Review of Systems: General: Negative for anorexia, weight loss, fever, chills, fatigue, weakness. ENT: Negative for hoarseness, difficulty swallowing. CV: Negative for chest pain, angina, palpitations, peripheral edema.  Respiratory: Negative for dyspnea at rest, cough, sputum, wheezing.  GI: See history of present illness. Endo: Negative for unusual weight change.  Heme: Negative for bruising or bleeding.  Physical Exam: BP (!) 96/57   Pulse 61   Temp (!) 97.4 F (36.3 C) (Oral)   Ht 5\' 5"  (1.651 m)   Wt 156 lb (70.8 kg)   BMI 25.96 kg/m  General:   Alert and oriented. Pleasant and cooperative. Well-nourished and well-developed.  Eyes:  Without icterus, sclera clear and conjunctiva pink.  Ears:  Normal auditory acuity. Cardiovascular:  S1, S2 present without murmurs appreciated. Normal pulses noted. Extremities without clubbing or edema. Respiratory:  Clear to auscultation bilaterally. No wheezes, rales, or rhonchi. No distress.  Gastrointestinal:  +BS, soft, and non-distended. Mild generalized TTP. No HSM noted. No guarding or rebound. No masses appreciated.    Rectal:  Deferred  Musculoskalatal:  Symmetrical without gross deformities. Neurologic:  Alert and oriented x4;  grossly normal neurologically. Psych:  Alert  and cooperative. Normal mood and affect. Heme/Lymph/Immune: No excessive bruising noted.    03/02/2018 9:43 AM   Disclaimer: This note was dictated with voice recognition software. Similar sounding words can inadvertently be transcribed and may not be corrected upon review.

## 2018-03-02 NOTE — Progress Notes (Signed)
CC'D TO PCP °

## 2018-03-02 NOTE — Assessment & Plan Note (Signed)
History of GERD previously doing well on Protonix.  She saw ENT and diagnosed with laryngopharyngeal reflux disease.  They changed her medications from Protonix daily to Prevacid daily and ranitidine in the evenings.  Recommend she continue her current medications.  In the setting of chronic GERD she does have repeat dysphasia as of the past month, as per below.  Follow-up in 4 months.

## 2018-03-02 NOTE — Assessment & Plan Note (Signed)
The patient describes generalized lower abdominal pain.  Abdominal tenderness noted on exam.  History of significant stool burden in the setting of constipation.  She stopped taking her previous constipation medications.  Her OB/GYN to started her on Amitiza.  She only took that for a few days.  She states she does not like taking meds.  We discussed that with constipation if she chooses not to take the constipation medication she will likely continue to have constipation and abdominal pain/bloating.  She vocalized understanding and is requested I resend a prescription of Linzess to her pharmacy.  Follow-up in 4 months.

## 2018-03-02 NOTE — Assessment & Plan Note (Signed)
Previously with dysphasia status post EGD with [facility] dilation in April 2018.  She began having dysphasia symptoms about 1 month ago, in the setting of chronic GERD and Sjogrens syndrome.  This is likely complicated by need for partials for missing back teeth.  Symptoms occur a couple times a week.  She is requesting a repeat dilation.  We will set her up for upper endoscopy with possible dilation.  If this is essentially normal we can evaluate for functional issues with a barium pill esophagram.  Recommend she continue working on getting partials.  Dysphasia 3 diet information provided.  Follow-up in 4 months.  Proceed with EGD +/- dilation with Dr. Gala Romney in near future: the risks, benefits, and alternatives have been discussed with the patient in detail. The patient states understanding and desires to proceed.  The patient is currently on Neurontin.  No other anticoagulants, anxiolytics, chronic pain medications, or antidepressants.  She sedation should be adequate for her procedure.

## 2018-03-04 ENCOUNTER — Telehealth: Payer: Self-pay | Admitting: Family Medicine

## 2018-03-04 NOTE — Telephone Encounter (Signed)
Please call pt and give her my message  Urine dip is a screening test.  Results below Color, Urine YELLOW YELLOW  YELLOW   APPearance CLEAR CLEAR  HAZYAbnormal    Specific Gravity, Urine 1.001 - 1.03 1.010  1.011 R  pH 5.0 - 8.0 5.5  8.0   Glucose, UA NEGATIVE NEGATIVE  NEGATIVE R  Bilirubin Urine NEGATIVE NEGATIVE  NEGATIVE   Ketones, ur NEGATIVE NEGATIVE  NEGATIVE R  Hgb urine dipstick NEGATIVE TRACEAbnormal   NEGATIVE   Protein, ur NEGATIVE NEGATIVE  NEGATIVE R  Nitrite NEGATIVE NEGATIVE  NEGATIVE   Leukocytes, UA NEGATIVE TRACEAbnormal   NEGATIVE CM  WBC, UA 0 - 5 /HPF 0-5    RBC / HPF 0 - 2 /HPF 0-2    Squamous Epithelial / LPF < OR = 5 /HPF 0-5    Bacteria, UA NONE SEEN /HPF NONE SEEN     Had no blood, white blood cells, or bacteria under the microscope.  Unremarkable.  I apologize that these results did not get relayed to you.   If you continue to have symptoms similar to several weeks ago I would like to retest the urine

## 2018-03-04 NOTE — Telephone Encounter (Signed)
Spoke with patient and informed her or results. Patient verbalized understanding.

## 2018-03-04 NOTE — Telephone Encounter (Signed)
Pt wants urine results  °

## 2018-03-17 DIAGNOSIS — Z780 Asymptomatic menopausal state: Secondary | ICD-10-CM | POA: Diagnosis not present

## 2018-03-17 DIAGNOSIS — K5900 Constipation, unspecified: Secondary | ICD-10-CM | POA: Diagnosis not present

## 2018-03-17 DIAGNOSIS — N816 Rectocele: Secondary | ICD-10-CM | POA: Diagnosis not present

## 2018-03-17 DIAGNOSIS — N8110 Cystocele, unspecified: Secondary | ICD-10-CM | POA: Diagnosis not present

## 2018-03-18 ENCOUNTER — Ambulatory Visit: Payer: BLUE CROSS/BLUE SHIELD | Admitting: Allergy & Immunology

## 2018-03-21 NOTE — Patient Instructions (Signed)
Yvette Mayer  03/21/2018     @PREFPERIOPPHARMACY @   Your procedure is scheduled on  03/31/2018.  Report to [facility] at  930  A.M.  Call this number if you have problems the morning of surgery:  [phone]   Remember:  Follow the diet and prep instructions given to you by Dr Roseanne Kaufman office.                      Take these medicines the morning of surgery with A SIP OF WATER prevacid.    Do not wear jewelry, make-up or nail polish.  Do not wear lotions, powders, or perfumes, or deodorant.  Do not shave 48 hours prior to surgery.  Men may shave face and neck.  Do not bring valuables to the hospital.  [facility] is not responsible for any belongings or valuables.  Contacts, dentures or bridgework may not be worn into surgery.  Leave your suitcase in the car.  After surgery it may be brought to your room.  For patients admitted to the hospital, discharge time will be determined by your treatment team.  Patients discharged the day of surgery will not be allowed to drive home.   Name and phone number of your driver:   family Special instructions:  None  Please read over the following fact sheets that you were given. Anesthesia Post-op Instructions and Care and Recovery After Surgery       Esophagogastroduodenoscopy Esophagogastroduodenoscopy (EGD) is a procedure to examine the lining of the esophagus, stomach, and first part of the small intestine (duodenum). This procedure is done to check for problems such as inflammation, bleeding, ulcers, or growths. During this procedure, a long, flexible, lighted tube with a camera attached (endoscope) is inserted down the throat. Tell a health care provider about:  Any allergies you have.  All medicines you are taking, including vitamins, herbs, eye drops, creams, and over-the-counter medicines.  Any problems you or family members have had with anesthetic medicines.  Any blood disorders you have.  Any  surgeries you have had.  Any medical conditions you have.  Whether you are pregnant or may be pregnant. What are the risks? Generally, this is a safe procedure. However, problems may occur, including:  Infection.  Bleeding.  A tear (perforation) in the esophagus, stomach, or duodenum.  Trouble breathing.  Excessive sweating.  Spasms of the larynx.  A slowed heartbeat.  Low blood pressure.  What happens before the procedure?  Follow instructions from your health care provider about eating or drinking restrictions.  Ask your health care provider about: ? Changing or stopping your regular medicines. This is especially important if you are taking diabetes medicines or blood thinners. ? Taking medicines such as aspirin and ibuprofen. These medicines can thin your blood. Do not take these medicines before your procedure if your health care provider instructs you not to.  Plan to have someone take you home after the procedure.  If you wear dentures, be ready to remove them before the procedure. What happens during the procedure?  To reduce your risk of infection, your health care team will wash or sanitize their hands.  An IV tube will be put in a vein in your hand or arm. You will get medicines and fluids through this tube.  You will be given one or more of the following: ? A medicine to help you relax (sedative). ? A  medicine to numb the area (local anesthetic). This medicine may be sprayed into your throat. It will make you feel more comfortable and keep you from gagging or coughing during the procedure. ? A medicine for pain.  A mouth guard may be placed in your mouth to protect your teeth and to keep you from biting on the endoscope.  You will be asked to lie on your left side.  The endoscope will be lowered down your throat into your esophagus, stomach, and duodenum.  Air will be put into the endoscope. This will help your health care provider see better.  The  lining of your esophagus, stomach, and duodenum will be examined.  Your health care provider may: ? Take a tissue sample so it can be looked at in a lab (biopsy). ? Remove growths. ? Remove objects (foreign bodies) that are stuck. ? Treat any bleeding with medicines or other devices that stop tissue from bleeding. ? Widen (dilate) or stretch narrowed areas of your esophagus and stomach.  The endoscope will be taken out. The procedure may vary among health care providers and hospitals. What happens after the procedure?  Your blood pressure, heart rate, breathing rate, and blood oxygen level will be monitored often until the medicines you were given have worn off.  Do not eat or drink anything until the numbing medicine has worn off and your gag reflex has returned. This information is not intended to replace advice given to you by your health care provider. Make sure you discuss any questions you have with your health care provider. Document Released: 09/18/2004 Document Revised: 10/24/2015 Document Reviewed: 04/11/2015 Elsevier Interactive Patient Education  2018 [employer]. Esophagogastroduodenoscopy, Care After Refer to this sheet in the next few weeks. These instructions provide you with information about caring for yourself after your procedure. Your health care provider may also give you more specific instructions. Your treatment has been planned according to current medical practices, but problems sometimes occur. Call your health care provider if you have any problems or questions after your procedure. What can I expect after the procedure? After the procedure, it is common to have:  A sore throat.  Nausea.  Bloating.  Dizziness.  Fatigue.  Follow these instructions at home:  Do not eat or drink anything until the numbing medicine (local anesthetic) has worn off and your gag reflex has returned. You will know that the local anesthetic has worn off when you can swallow  comfortably.  Do not drive for 24 hours if you received a medicine to help you relax (sedative).  If your health care provider took a tissue sample for testing during the procedure, make sure to get your test results. This is your responsibility. Ask your health care provider or the department performing the test when your results will be ready.  Keep all follow-up visits as told by your health care provider. This is important. Contact a health care provider if:  You cannot stop coughing.  You are not urinating.  You are urinating less than usual. Get help right away if:  You have trouble swallowing.  You cannot eat or drink.  You have throat or chest pain that gets worse.  You are dizzy or light-headed.  You faint.  You have nausea or vomiting.  You have chills.  You have a fever.  You have severe abdominal pain.  You have black, tarry, or bloody stools. This information is not intended to replace advice given to you by your health care  provider. Make sure you discuss any questions you have with your health care provider. Document Released: 05/04/2012 Document Revised: 10/24/2015 Document Reviewed: 04/11/2015 Elsevier Interactive Patient Education  2018 [employer].  Esophageal Dilatation Esophageal dilatation is a procedure to open a blocked or narrowed part of the esophagus. The esophagus is the long tube in your throat that carries food and liquid from your mouth to your stomach. The procedure is also called esophageal dilation. You may need this procedure if you have a buildup of scar tissue in your esophagus that makes it difficult, painful, or even impossible to swallow. This can be caused by gastroesophageal reflux disease (GERD). In rare cases, people need this procedure because they have cancer of the esophagus or a problem with the way food moves through the esophagus. Sometimes you may need to have another dilatation to enlarge the opening of the esophagus  gradually. Tell a health care provider about:  Any allergies you have.  All medicines you are taking, including vitamins, herbs, eye drops, creams, and over-the-counter medicines.  Any problems you or family members have had with anesthetic medicines.  Any blood disorders you have.  Any surgeries you have had.  Any medical conditions you have.  Any antibiotic medicines you are required to take before dental procedures. What are the risks? Generally, this is a safe procedure. However, problems can occur and include:  Bleeding from a tear in the lining of the esophagus.  A hole (perforation) in the esophagus.  What happens before the procedure?  Do not eat or drink anything after midnight on the night before the procedure or as directed by your health care provider.  Ask your health care provider about changing or stopping your regular medicines. This is especially important if you are taking diabetes medicines or blood thinners.  Plan to have someone take you home after the procedure. What happens during the procedure?  You will be given a medicine that makes you relaxed and sleepy (sedative).  A medicine may be sprayed or gargled to numb the back of the throat.  Your health care provider can use various instruments to do an esophageal dilatation. During the procedure, the instrument used will be placed in your mouth and passed down into your esophagus. Options include: ? Simple dilators. This instrument is carefully placed in the esophagus to stretch it. ? Guided wire bougies. In this method, a flexible tube (endoscope) is used to insert a wire into the esophagus. The dilator is passed over this wire to enlarge the esophagus. Then the wire is removed. ? Balloon dilators. An endoscope with a small balloon at the end is passed down into the esophagus. Inflating the balloon gently stretches the esophagus and opens it up. What happens after the procedure?  Your blood pressure,  heart rate, breathing rate, and blood oxygen level will be monitored often until the medicines you were given have worn off.  Your throat may feel slightly sore and will probably still feel numb. This will improve slowly over time.  You will not be allowed to eat or drink until the throat numbness has resolved.  If this is a same-day procedure, you may be allowed to go home once you have been able to drink, urinate, and sit on the edge of the bed without nausea or dizziness.  If this is a same-day procedure, you should have a friend or family member with you for the next 24 hours after the procedure. This information is not intended to replace  advice given to you by your health care provider. Make sure you discuss any questions you have with your health care provider. Document Released: 07/09/2005 Document Revised: 10/24/2015 Document Reviewed: 09/27/2013 Elsevier Interactive Patient Education  2018 [place] Anesthesia is a term that refers to techniques, procedures, and medicines that help a person stay safe and comfortable during a medical procedure. Monitored anesthesia care, or sedation, is one type of anesthesia. Your anesthesia specialist may recommend sedation if you will be having a procedure that does not require you to be unconscious, such as:  Cataract surgery.  A dental procedure.  A biopsy.  A colonoscopy.  During the procedure, you may receive a medicine to help you relax (sedative). There are three levels of sedation:  Mild sedation. At this level, you may feel awake and relaxed. You will be able to follow directions.  Moderate sedation. At this level, you will be sleepy. You may not remember the procedure.  Deep sedation. At this level, you will be asleep. You will not remember the procedure.  The more medicine you are given, the deeper your level of sedation will be. Depending on how you respond to the procedure, the anesthesia  specialist may change your level of sedation or the type of anesthesia to fit your needs. An anesthesia specialist will monitor you closely during the procedure. Let your health care provider know about:  Any allergies you have.  All medicines you are taking, including vitamins, herbs, eye drops, creams, and over-the-counter medicines.  Any use of steroids (by mouth or as a cream).  Any problems you or family members have had with sedatives and anesthetic medicines.  Any blood disorders you have.  Any surgeries you have had.  Any medical conditions you have, such as sleep apnea.  Whether you are pregnant or may be pregnant.  Any use of cigarettes, alcohol, or street drugs. What are the risks? Generally, this is a safe procedure. However, problems may occur, including:  Getting too much medicine (oversedation).  Nausea.  Allergic reaction to medicines.  Trouble breathing. If this happens, a breathing tube may be used to help with breathing. It will be removed when you are awake and breathing on your own.  Heart trouble.  Lung trouble.  Before the procedure Staying hydrated Follow instructions from your health care provider about hydration, which may include:  Up to 2 hours before the procedure - you may continue to drink clear liquids, such as water, clear fruit juice, black coffee, and plain tea.  Eating and drinking restrictions Follow instructions from your health care provider about eating and drinking, which may include:  8 hours before the procedure - stop eating heavy meals or foods such as meat, fried foods, or fatty foods.  6 hours before the procedure - stop eating light meals or foods, such as toast or cereal.  6 hours before the procedure - stop drinking milk or drinks that contain milk.  2 hours before the procedure - stop drinking clear liquids.  Medicines Ask your health care provider about:  Changing or stopping your regular medicines. This is  especially important if you are taking diabetes medicines or blood thinners.  Taking medicines such as aspirin and ibuprofen. These medicines can thin your blood. Do not take these medicines before your procedure if your health care provider instructs you not to.  Tests and exams  You will have a physical exam.  You may have blood tests done to show: ?  How well your kidneys and liver are working. ? How well your blood can clot.  General instructions  Plan to have someone take you home from the hospital or clinic.  If you will be going home right after the procedure, plan to have someone with you for 24 hours.  What happens during the procedure?  Your blood pressure, heart rate, breathing, level of pain and overall condition will be monitored.  An IV tube will be inserted into one of your veins.  Your anesthesia specialist will give you medicines as needed to keep you comfortable during the procedure. This may mean changing the level of sedation.  The procedure will be performed. After the procedure  Your blood pressure, heart rate, breathing rate, and blood oxygen level will be monitored until the medicines you were given have worn off.  Do not drive for 24 hours if you received a sedative.  You may: ? Feel sleepy, clumsy, or nauseous. ? Feel forgetful about what happened after the procedure. ? Have a sore throat if you had a breathing tube during the procedure. ? Vomit. This information is not intended to replace advice given to you by your health care provider. Make sure you discuss any questions you have with your health care provider. Document Released: 02/11/2005 Document Revised: 10/25/2015 Document Reviewed: 09/08/2015 Elsevier Interactive Patient Education  2018 [place], Care After These instructions provide you with information about caring for yourself after your procedure. Your health care provider may also give you more specific  instructions. Your treatment has been planned according to current medical practices, but problems sometimes occur. Call your health care provider if you have any problems or questions after your procedure. What can I expect after the procedure? After your procedure, it is common to:  Feel sleepy for several hours.  Feel clumsy and have poor balance for several hours.  Feel forgetful about what happened after the procedure.  Have poor judgment for several hours.  Feel nauseous or vomit.  Have a sore throat if you had a breathing tube during the procedure.  Follow these instructions at home: For at least 24 hours after the procedure:   Do not: ? Participate in activities in which you could fall or become injured. ? Drive. ? Use heavy machinery. ? Drink alcohol. ? Take sleeping pills or medicines that cause drowsiness. ? Make important decisions or sign legal documents. ? Take care of children on your own.  Rest. Eating and drinking  Follow the diet that is recommended by your health care provider.  If you vomit, drink water, juice, or soup when you can drink without vomiting.  Make sure you have little or no nausea before eating solid foods. General instructions  Have a responsible adult stay with you until you are awake and alert.  Take over-the-counter and prescription medicines only as told by your health care provider.  If you smoke, do not smoke without supervision.  Keep all follow-up visits as told by your health care provider. This is important. Contact a health care provider if:  You keep feeling nauseous or you keep vomiting.  You feel light-headed.  You develop a rash.  You have a fever. Get help right away if:  You have trouble breathing. This information is not intended to replace advice given to you by your health care provider. Make sure you discuss any questions you have with your health care provider. Document Released: 09/08/2015 Document  Revised: 01/08/2016 Document Reviewed: 09/08/2015  [employer]ered certified [employer] Patient Education  [employer].

## 2018-03-22 ENCOUNTER — Telehealth: Payer: Self-pay | Admitting: Internal Medicine

## 2018-03-22 MED ORDER — PANTOPRAZOLE SODIUM 40 MG PO TBEC: 40.0000 mg | DELAYED_RELEASE_TABLET | Freq: Every day | ORAL | 3 refills | Status: DC

## 2018-03-22 NOTE — Telephone Encounter (Signed)
Pt would like her RX Pantoprazole sent to [facility], medication isn't on file there.

## 2018-03-22 NOTE — Telephone Encounter (Signed)
Did we ever have her on Protonix? It says she was on Nexium. I will send in Protonix. Make sure not taking both.

## 2018-03-22 NOTE — Telephone Encounter (Signed)
Tried calling pt, no VM is set up, not able to leave a message.

## 2018-03-22 NOTE — Addendum Note (Signed)
Addended by: Annitta Needs on: 03/22/2018 04:13 PM   Modules accepted: Orders

## 2018-03-22 NOTE — Telephone Encounter (Signed)
Pt called saying she was out of her pantoprazole and needed a refill. I told her to call her pharmacy and have them send [place] a refill request. She called back 5 minutes later to say that [facility] in [place] told her to call us because they didn't have it on file there.

## 2018-03-23 NOTE — Telephone Encounter (Signed)
Tried calling pt, not able to leave a VM due to [facility] being full.

## 2018-03-24 NOTE — Telephone Encounter (Signed)
Pt isn't taking Nexium. She was given that by the ENT but likes the Pantoprazole a lot better. Pt was notified that Rx was sent into her pharmacy.

## 2018-03-25 ENCOUNTER — Encounter (HOSPITAL_COMMUNITY): Payer: Self-pay

## 2018-03-25 ENCOUNTER — Other Ambulatory Visit: Payer: Self-pay

## 2018-03-25 ENCOUNTER — Encounter (HOSPITAL_COMMUNITY)
Admission: RE | Admit: 2018-03-25 | Discharge: 2018-03-25 | Disposition: A | Discharge status: Home / Self Care | Payer: BLUE CROSS/BLUE SHIELD | Source: Ambulatory Visit | Attending: Internal Medicine | Admitting: Internal Medicine

## 2018-03-25 ENCOUNTER — Telehealth: Payer: Self-pay | Admitting: Internal Medicine

## 2018-03-25 DIAGNOSIS — R1310 Dysphagia, unspecified: Secondary | ICD-10-CM | POA: Diagnosis not present

## 2018-03-25 DIAGNOSIS — Z01818 Encounter for other preprocedural examination: Secondary | ICD-10-CM | POA: Insufficient documentation

## 2018-03-25 DIAGNOSIS — R1084 Generalized abdominal pain: Secondary | ICD-10-CM | POA: Diagnosis not present

## 2018-03-25 DIAGNOSIS — K219 Gastro-esophageal reflux disease without esophagitis: Secondary | ICD-10-CM | POA: Diagnosis not present

## 2018-03-25 DIAGNOSIS — R001 Bradycardia, unspecified: Secondary | ICD-10-CM | POA: Diagnosis not present

## 2018-03-25 HISTORY — DX: Personal history of urinary calculi: Z87.442

## 2018-03-25 NOTE — Telephone Encounter (Signed)
Noted  

## 2018-03-25 NOTE — Telephone Encounter (Signed)
Pt called back saying [facility] was going to have [facility] transfer her prescription. Disregard message

## 2018-03-25 NOTE — Telephone Encounter (Signed)
Pt needs [facility] [place] [facility] of [facility] in [place]. She said her insurance wouldn't pay for her prescription at [facility], but it will at [facility]

## 2018-03-30 ENCOUNTER — Telehealth: Payer: Self-pay | Admitting: Internal Medicine

## 2018-03-30 NOTE — Telephone Encounter (Signed)
Pt called to cancel her procedure with [facility] in the morning. She wants to take her medicine since that seems to have helped. She does not want to reschedule.

## 2018-03-30 NOTE — Telephone Encounter (Signed)
Called endo and cancelled procedure. Fowarding to [facility] as an [place]

## 2018-03-31 ENCOUNTER — Encounter (HOSPITAL_COMMUNITY): Admission: RE | Payer: Self-pay | Source: Ambulatory Visit

## 2018-03-31 ENCOUNTER — Ambulatory Visit (HOSPITAL_COMMUNITY)
Admission: RE | Admit: 2018-03-31 | Payer: BLUE CROSS/BLUE SHIELD | Source: Ambulatory Visit | Admitting: Internal Medicine

## 2018-03-31 SURGERY — ESOPHAGOGASTRODUODENOSCOPY (EGD) WITH PROPOFOL
Anesthesia: Monitor Anesthesia Care

## 2018-04-04 NOTE — Telephone Encounter (Signed)
Noted, no further recommendations. 

## 2018-04-19 ENCOUNTER — Ambulatory Visit: Payer: BLUE CROSS/BLUE SHIELD | Admitting: Family Medicine

## 2018-04-19 DIAGNOSIS — G8929 Other chronic pain: Secondary | ICD-10-CM | POA: Diagnosis not present

## 2018-04-19 DIAGNOSIS — R51 Headache: Secondary | ICD-10-CM | POA: Diagnosis not present

## 2018-04-19 DIAGNOSIS — K219 Gastro-esophageal reflux disease without esophagitis: Secondary | ICD-10-CM | POA: Diagnosis not present

## 2018-04-19 DIAGNOSIS — M3500 Sicca syndrome, unspecified: Secondary | ICD-10-CM | POA: Diagnosis not present

## 2018-04-19 DIAGNOSIS — H938X3 Other specified disorders of ear, bilateral: Secondary | ICD-10-CM | POA: Diagnosis not present

## 2018-04-19 DIAGNOSIS — R490 Dysphonia: Secondary | ICD-10-CM | POA: Diagnosis not present

## 2018-04-21 ENCOUNTER — Encounter: Payer: Self-pay | Admitting: Adult Health

## 2018-04-21 ENCOUNTER — Encounter (INDEPENDENT_AMBULATORY_CARE_PROVIDER_SITE_OTHER): Payer: Self-pay

## 2018-04-21 ENCOUNTER — Ambulatory Visit: Payer: BLUE CROSS/BLUE SHIELD | Admitting: Adult Health

## 2018-04-21 VITALS — BP 100/65 | HR 62 | Ht 64.0 in | Wt 156.4 lb

## 2018-04-21 DIAGNOSIS — N898 Other specified noninflammatory disorders of vagina: Secondary | ICD-10-CM | POA: Diagnosis not present

## 2018-04-21 LAB — POCT WET PREP (WET MOUNT): Clue Cells Wet Prep Whiff POC: NEGATIVE

## 2018-04-21 NOTE — Progress Notes (Addendum)
  Subjective:     [name], female   DOB: [birth year], 47 y.o.   MRN: [id number]  HPI Yvette Mayer is a 47 year old black female,divorced in complaining of white discharge in pubic hair for last 2 weeks, no itching.  Review of Systems Vaginal discharge, in pubic hair at times over last 2 weeks Denies any itching Has some breast tenderness at times, too, so stopped patch, will resume now Not sexually active Reviewed past medical,surgical, social and family history. Reviewed medications and allergies.     Objective:   Physical Exam BP 100/65 (BP Location: Left Arm, Patient Position: Sitting, Cuff Size: Normal)   Pulse 62   Ht 5\' 4"  (1.626 m)   Wt 156 lb 6.4 oz (70.9 kg)   BMI 26.85 kg/m   Skin warm and dry.Pelvic: external genitalia is normal in appearance no lesions, vagina: white discharge without odor,urethra has no lesions or masses noted, cervix and uterus are absent adnexa: no masses or tenderness noted. Bladder is non tender and no masses felt. Wet prep: negative. I think she may be seeing the vagifem when it melts, so pay attention if sees again, if after has used the vagifem. Examination chaperoned by Diona Fanti CMA.    Assessment:     1. Vaginal discharge       Plan:     Continue vagifem F/U prn

## 2018-05-06 ENCOUNTER — Ambulatory Visit: Payer: BLUE CROSS/BLUE SHIELD | Admitting: Allergy & Immunology

## 2018-05-11 DIAGNOSIS — K219 Gastro-esophageal reflux disease without esophagitis: Secondary | ICD-10-CM | POA: Diagnosis not present

## 2018-05-11 DIAGNOSIS — G501 Atypical facial pain: Secondary | ICD-10-CM | POA: Diagnosis not present

## 2018-05-11 DIAGNOSIS — J3089 Other allergic rhinitis: Secondary | ICD-10-CM | POA: Diagnosis not present

## 2018-05-11 DIAGNOSIS — J343 Hypertrophy of nasal turbinates: Secondary | ICD-10-CM | POA: Diagnosis not present

## 2018-05-11 DIAGNOSIS — J329 Chronic sinusitis, unspecified: Secondary | ICD-10-CM | POA: Diagnosis not present

## 2018-05-27 ENCOUNTER — Ambulatory Visit: Payer: BLUE CROSS/BLUE SHIELD | Admitting: Women's Health

## 2018-06-03 ENCOUNTER — Ambulatory Visit: Payer: BLUE CROSS/BLUE SHIELD | Admitting: Allergy & Immunology

## 2018-06-13 ENCOUNTER — Ambulatory Visit: Payer: BLUE CROSS/BLUE SHIELD | Admitting: Adult Health

## 2018-06-20 ENCOUNTER — Ambulatory Visit: Payer: BLUE CROSS/BLUE SHIELD | Admitting: Adult Health

## 2018-06-27 ENCOUNTER — Ambulatory Visit: Payer: BLUE CROSS/BLUE SHIELD | Admitting: Adult Health

## 2018-06-27 ENCOUNTER — Other Ambulatory Visit: Payer: Self-pay

## 2018-06-27 ENCOUNTER — Encounter: Payer: Self-pay | Admitting: Adult Health

## 2018-06-27 VITALS — BP 100/61 | HR 58 | Ht 65.0 in | Wt 161.0 lb

## 2018-06-27 DIAGNOSIS — N898 Other specified noninflammatory disorders of vagina: Secondary | ICD-10-CM

## 2018-06-27 DIAGNOSIS — B9689 Other specified bacterial agents as the cause of diseases classified elsewhere: Secondary | ICD-10-CM | POA: Diagnosis not present

## 2018-06-27 DIAGNOSIS — N760 Acute vaginitis: Secondary | ICD-10-CM | POA: Diagnosis not present

## 2018-06-27 LAB — POCT WET PREP (WET MOUNT)
CLUE CELLS WET PREP WHIFF POC: POSITIVE
WBC, Wet Prep HPF POC: POSITIVE

## 2018-06-27 MED ORDER — METRONIDAZOLE 500 MG PO TABS
500.0000 mg | ORAL_TABLET | Freq: Two times a day (BID) | ORAL | 0 refills | Status: DC
Start: 2018-06-27 — End: 2018-07-06

## 2018-06-27 NOTE — Progress Notes (Signed)
[name], female   DOB: 1970-12-03, 48 y.o.   MRN: [id number] History of Present Illness:  Yvette Mayer is a 47 year old black female, in complaining of vaginal discharge, itching and burning and odor for about a week. PCP is Dr Buelah Manis   Current Medications, Allergies, Past Medical History, Past Surgical History, Family History and Social History were reviewed in [employer] record.     Review of Systems: +vaginal discharge +itching and burning +odor for about a week, after wearing tight jeans to work  Has not had sex in long time    Physical Exam:BP 100/61 (BP Location: Right Arm, Patient Position: Sitting, Cuff Size: Normal)   Pulse (!) 58   Ht 5\' 5"  (1.651 m)   Wt 161 lb (73 kg)   BMI 26.79 kg/m  General:  Well developed, well nourished, no acute distress Skin:  Warm and dry Pelvic:  External genitalia is normal in appearance, no lesions.  The vagina has red side walls and creamy discharge with +whiff. Urethra has no lesions or masses. The cervix and uterus are absent.  No adnexal masses or tenderness noted.Bladder is non tender, no masses felt. Wet prep:++WBCs and + clue cells.  Psych:  No mood changes, alert and cooperative,seems happy Fall risk is low. Examination chaperoned by Shela Nevin RN.  Impression:  1. BV (bacterial vaginosis)   2. Vaginal discharge   3. Vaginal odor      Plan: Rx flagyl 500 mg 1 bid x 7 days, no alcohol, review handout on BV   Return in 4 months for well woman physical

## 2018-06-27 NOTE — Patient Instructions (Signed)
Bacterial Vaginosis    Bacterial vaginosis is a vaginal infection that occurs when the normal balance of bacteria in the vagina is disrupted. It results from an overgrowth of certain bacteria. This is the most common vaginal infection among women ages 15-44.  Because bacterial vaginosis increases your risk for STIs (sexually transmitted infections), getting treated can help reduce your risk for chlamydia, gonorrhea, herpes, and HIV (human immunodeficiency virus). Treatment is also important for preventing complications in pregnant women, because this condition can cause an early (premature) delivery.  What are the causes?  This condition is caused by an increase in harmful bacteria that are normally present in small amounts in the vagina. However, the reason that the condition develops is not fully understood.  What increases the risk?  The following factors may make you more likely to develop this condition:  · Having a new sexual partner or multiple sexual partners.  · Having unprotected sex.  · Douching.  · Having an intrauterine device (IUD).  · Smoking.  · Drug and alcohol abuse.  · Taking certain antibiotic medicines.  · Being pregnant.  You cannot get bacterial vaginosis from toilet seats, bedding, swimming pools, or contact with objects around you.  What are the signs or symptoms?  Symptoms of this condition include:  · Grey or white vaginal discharge. The discharge can also be watery or foamy.  · A fish-like odor with discharge, especially after sexual intercourse or during menstruation.  · Itching in and around the vagina.  · Burning or pain with urination.  Some women with bacterial vaginosis have no signs or symptoms.  How is this diagnosed?  This condition is diagnosed based on:  · Your medical history.  · A physical exam of the vagina.  · Testing a sample of vaginal fluid under a microscope to look for a large amount of bad bacteria or abnormal cells. Your health care provider may use a cotton swab or  a small wooden spatula to collect the sample.  How is this treated?  This condition is treated with antibiotics. These may be given as a pill, a vaginal cream, or a medicine that is put into the vagina (suppository). If the condition comes back after treatment, a second round of antibiotics may be needed.  Follow these instructions at home:  Medicines  · Take over-the-counter and prescription medicines only as told by your health care provider.  · Take or use your antibiotic as told by your health care provider. Do not stop taking or using the antibiotic even if you start to feel better.  General instructions  · If you have a female sexual partner, tell her that you have a vaginal infection. She should see her health care provider and be treated if she has symptoms. If you have a female sexual partner, he does not need treatment.  · During treatment:  ? Avoid sexual activity until you finish treatment.  ? Do not douche.  ? Avoid alcohol as directed by your health care provider.  ? Avoid breastfeeding as directed by your health care provider.  · Drink enough water and fluids to keep your urine clear or pale yellow.  · Keep the area around your vagina and rectum clean.  ? Wash the area daily with warm water.  ? Wipe yourself from front to back after using the toilet.  · Keep all follow-up visits as told by your health care provider. This is important.  How is this prevented?  · Do not   douche.  · Wash the outside of your vagina with warm water only.  · Use protection when having sex. This includes latex condoms and dental dams.  · Limit how many sexual partners you have. To help prevent bacterial vaginosis, it is best to have sex with just one partner (monogamous).  · Make sure you and your sexual partner are tested for STIs.  · Wear cotton or cotton-lined underwear.  · Avoid wearing tight pants and pantyhose, especially during summer.  · Limit the amount of alcohol that you drink.  · Do not use any products that contain  nicotine or tobacco, such as cigarettes and e-cigarettes. If you need help quitting, ask your health care provider.  · Do not use illegal drugs.  Where to find more information  · Centers for Disease Control and Prevention: www.cdc.gov/std  · American Sexual Health Association (ASHA): www.ashastd.org  · U.[place]. Department of Health and Human Services, Office on Women's Health: www.womenshealth.gov/ or https://www.womenshealth.gov/a-z-topics/bacterial-vaginosis  Contact a health care provider if:  · Your symptoms do not improve, even after treatment.  · You have more discharge or pain when urinating.  · You have a fever.  · You have pain in your abdomen.  · You have pain during sex.  · You have vaginal bleeding between periods.  Summary  · Bacterial vaginosis is a vaginal infection that occurs when the normal balance of bacteria in the vagina is disrupted.  · Because bacterial vaginosis increases your risk for STIs (sexually transmitted infections), getting treated can help reduce your risk for chlamydia, gonorrhea, herpes, and HIV (human immunodeficiency virus). Treatment is also important for preventing complications in pregnant women, because the condition can cause an early (premature) delivery.  · This condition is treated with antibiotic medicines. These may be given as a pill, a vaginal cream, or a medicine that is put into the vagina (suppository).  This information is not intended to replace advice given to you by your health care provider. Make sure you discuss any questions you have with your health care provider.  Document Released: 05/18/2005 Document Revised: 09/21/2016 Document Reviewed: 02/01/2016  Elsevier Interactive Patient Education © 2019 [employer]

## 2018-07-06 ENCOUNTER — Other Ambulatory Visit: Payer: Self-pay

## 2018-07-06 ENCOUNTER — Encounter

## 2018-07-06 ENCOUNTER — Encounter: Payer: Self-pay | Admitting: Family Medicine

## 2018-07-06 ENCOUNTER — Ambulatory Visit: Payer: BLUE CROSS/BLUE SHIELD | Admitting: Nurse Practitioner

## 2018-07-06 ENCOUNTER — Ambulatory Visit: Payer: BLUE CROSS/BLUE SHIELD | Admitting: Family Medicine

## 2018-07-06 VITALS — BP 110/64 | HR 82 | Temp 97.9°F | Resp 16 | Ht 65.0 in | Wt 158.0 lb

## 2018-07-06 DIAGNOSIS — F4323 Adjustment disorder with mixed anxiety and depressed mood: Secondary | ICD-10-CM | POA: Diagnosis not present

## 2018-07-06 DIAGNOSIS — R5383 Other fatigue: Secondary | ICD-10-CM | POA: Diagnosis not present

## 2018-07-06 DIAGNOSIS — Z658 Other specified problems related to psychosocial circumstances: Secondary | ICD-10-CM

## 2018-07-06 DIAGNOSIS — F5101 Primary insomnia: Secondary | ICD-10-CM

## 2018-07-06 MED ORDER — TRAZODONE HCL 50 MG PO TABS: 25.0000 mg | ORAL_TABLET | Freq: Every evening | ORAL | 3 refills | Status: DC | PRN
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
## 2018-07-06 NOTE — Patient Instructions (Signed)
F/U 6 weeks Try the trazodone at bedtime
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
## 2018-07-07 LAB — CBC WITH DIFFERENTIAL/PLATELET
Absolute Monocytes: 304 cells/uL (ref 200–950)
BASOS ABS: 70 {cells}/uL (ref 0–200)
Basophils Relative: 1.8 %
EOS PCT: 3.4 %
Eosinophils Absolute: 133 cells/uL (ref 15–500)
HCT: 36.5 % (ref 35.0–45.0)
HEMOGLOBIN: 12.4 g/dL (ref 11.7–15.5)
Lymphs Abs: 2126 cells/uL (ref 850–3900)
MCH: 31.4 pg (ref 27.0–33.0)
MCHC: 34 g/dL (ref 32.0–36.0)
MCV: 92.4 fL (ref 80.0–100.0)
MONOS PCT: 7.8 %
MPV: 9.9 fL (ref 7.5–12.5)
NEUTROS ABS: 1268 {cells}/uL — AB (ref 1500–7800)
NEUTROS PCT: 32.5 %
Platelets: 329 10*3/uL (ref 140–400)
RBC: 3.95 10*6/uL (ref 3.80–5.10)
RDW: 12.3 % (ref 11.0–15.0)
Total Lymphocyte: 54.5 %
WBC: 3.9 10*3/uL (ref 3.8–10.8)

## 2018-07-07 LAB — VITAMIN B12: VITAMIN B 12: 819 pg/mL (ref 200–1100)

## 2018-07-07 LAB — COMPREHENSIVE METABOLIC PANEL
AG Ratio: 1.3 (calc) (ref 1.0–2.5)
ALT: 17 U/L (ref 6–29)
AST: 22 U/L (ref 10–35)
Albumin: 3.9 g/dL (ref 3.6–5.1)
Alkaline phosphatase (APISO): 66 U/L (ref 31–125)
BUN: 15 mg/dL (ref 7–25)
CO2: 27 mmol/L (ref 20–32)
CREATININE: 0.83 mg/dL (ref 0.50–1.10)
Calcium: 9.4 mg/dL (ref 8.6–10.2)
Chloride: 103 mmol/L (ref 98–110)
Globulin: 3.1 g/dL (calc) (ref 1.9–3.7)
Glucose, Bld: 78 mg/dL (ref 65–99)
Potassium: 4.2 mmol/L (ref 3.5–5.3)
SODIUM: 138 mmol/L (ref 135–146)
TOTAL PROTEIN: 7 g/dL (ref 6.1–8.1)
Total Bilirubin: 0.3 mg/dL (ref 0.2–1.2)

## 2018-07-07 LAB — TSH: TSH: 2.01 mIU/L

## 2018-07-07 LAB — VITAMIN D 25 HYDROXY (VIT D DEFICIENCY, FRACTURES): Vit D, 25-Hydroxy: 29 ng/mL — ABNORMAL LOW (ref 30–100)

## 2018-07-11 NOTE — Progress Notes (Deleted)
Office Visit Note  Patient: Yvette Mayer             Date of Birth: January 05, 1971           MRN: [id number]             PCP: Alycia Rossetti, MD Referring: Alycia Rossetti, MD Visit Date: 07/14/2018 Occupation: @GUARO[employer] @  Subjective:  No chief complaint on file.   History of Present Illness: Yvette Mayer is a 48 y.o. female ***   Activities of Daily Living:  Patient reports morning stiffness for *** {minute/hour:19697}.   Patient {ACTIONS;DENIES/REPORTS:21021675::"Denies"} nocturnal pain.  Difficulty dressing/grooming: {ACTIONS;DENIES/REPORTS:21021675::"Denies"} Difficulty climbing stairs: {ACTIONS;DENIES/REPORTS:21021675::"Denies"} Difficulty getting out of chair: {ACTIONS;DENIES/REPORTS:21021675::"Denies"} Difficulty using hands for taps, buttons, cutlery, and/or writing: {ACTIONS;DENIES/REPORTS:21021675::"Denies"}  No Rheumatology ROS completed.   PMFS History:  Patient Active Problem List   Diagnosis Date Noted  . Abdominal pain 03/02/2018  . History of UTI 11/29/2017  . Dyspareunia, female 11/29/2017  . Vaginal dryness 11/29/2017  . Seasonal and perennial allergic rhinitis 10/12/2017  . Adverse food reaction 10/12/2017  . Screening for colorectal cancer 10/06/2017  . Well woman exam with routine gynecological exam 10/06/2017  . Vaginal irritation 07/29/2017  . Vaginal atrophy 07/29/2017  . ANA positive Speckled 1:320 titer ENA negative  11/20/2016  . Leukopenia 11/11/2016  . History of bruising easily 09/08/2016  . Borderline high cholesterol 09/08/2016  . Situational mixed anxiety and depressive disorder 08/24/2016  . Rectocele 07/16/2016  . Kidney stone on right side 10/24/2015  . Vaginal discharge 08/08/2015  . Hot flashes 04/10/2015  . Symptoms, such as flushing, sleeplessness, headache, lack of concentration, associated with the menopause 04/10/2015  . 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 Dyspareunia 09/12/2014  . BV (bacterial vaginosis) 03/21/2014  .  Constipation 12/26/2013  . Overweight (BMI 25.0-29.9) 12/26/2013  . Sjoegren syndrome 08/09/2013  . Insomnia 09/13/2012  . GERD (gastroesophageal reflux disease) 09/18/2011  . Anxiety 09/03/2011  . Hyperlipidemia 09/02/2011  . Fibromyalgia 09/02/2011  . Chronic nasal congestion 09/02/2011  . Vitamin D deficiency 09/02/2011  . Dysphagia 01/13/2011    Past Medical History:  Diagnosis Date  . Abdominal pain 06/12/2014  . Allergy   . Anemia    iron 1 yr ago, normal hgb 7/12  . Anxiety   . Asthma    pt says no-bronchitis  . Bloated abdomen 10/15/2015  . Chronic back pain   . Chronic headaches   . Chronic neck pain   . Chronic sinusitis   . Current use of estrogen therapy 04/16/2015  . Dyspareunia 09/12/2014  . Elevated TSH 04/16/2015  . Fatigue 04/10/2015  . Fibromyalgia   . Gas 10/15/2015  . GERD (gastroesophageal reflux disease)   . Herpes simplex virus (HSV) infection   . History of kidney stones   . Hot flashes 04/10/2015  . Hyperlipidemia   . Kidney stone on right side 10/24/2015  . Mental disorder   . Ovarian cyst, right   . Paresthesias   . Shortness of breath   . Sinus drainage   . Sjoegren syndrome   . Symptoms, such as flushing, sleeplessness, headache, lack of concentration, associated with the menopause 04/10/2015  . TMJ (dislocation of temporomandibular joint)   . Vaginal discharge 08/08/2015  . Vertigo   . Yeast infection 10/15/2015    Family History  Problem Relation Age of Onset  . Hypertension Mother   . Thyroid disease Sister        overactive  . Asthma Sister   .  Eczema Sister   . Bronchitis Son   . Heart disease Maternal Grandmother   . Hypertension Maternal Grandmother   . Alcohol abuse Maternal Grandfather   . Scoliosis Son   . Other Maternal Aunt        Sjeogren syndrome  . Other Maternal Aunt        Sjoegren syndrome  . Other Maternal Aunt        Sjoegren syndrome  . Anesthesia problems Neg Hx   . Malignant hyperthermia Neg Hx   .  Hypotension Neg Hx   . Pseudochol deficiency Neg Hx   . Colon cancer Neg Hx    Past Surgical History:  Procedure Laterality Date  . ABDOMINAL HYSTERECTOMY    . BIOPSY  09/09/2016   Procedure: BIOPSY;  Surgeon: Daneil Dolin, MD;  Location: [facility];  Service: Endoscopy;;  esophageal  . BREAST CYST EXCISION Right 1980   benign  . CESAREAN SECTION     x2  . CHOLECYSTECTOMY  2005   cholelithiasis  . COLONOSCOPY N/A 09/09/2016   Procedure: COLONOSCOPY;  Surgeon: Daneil Dolin, MD;  Location: [facility];  Service: Endoscopy;  Laterality: N/A;  8:30am  . ESOPHAGOGASTRODUODENOSCOPY  01/23/2011   OQH:[id number] undulating Z-line vs short segment Barrett s/p bx (NOT BARRETT's)/small HH otherwise normal  . ESOPHAGOGASTRODUODENOSCOPY N/A 09/09/2016   Procedure: ESOPHAGOGASTRODUODENOSCOPY (EGD);  Surgeon: Daneil Dolin, MD;  Location: [facility];  Service: Endoscopy;  Laterality: N/A;  Venia Minks DILATION  01/23/2011   Procedure: Venia Minks DILATION;  Surgeon: Daneil Dolin, MD;  Location: [facility];  Service: Endoscopy;  Laterality: N/A;  . Venia Minks DILATION N/A 09/09/2016   Procedure: Venia Minks DILATION;  Surgeon: Daneil Dolin, MD;  Location: [facility];  Service: Endoscopy;  Laterality: N/A;  . OVARIAN CYST REMOVAL  92 removal of cysts from behind ovaries  . PARTIAL HYSTERECTOMY  93  . SEPTOPLASTY     2011  . TEMPOROMANDIBULAR JOINT SURGERY    . TONSILLECTOMY     2010  . TURBINATE RESECTION Bilateral 05/29/2013   Procedure: TURBINATE RESECTION;  Surgeon: Ascencion Dike, MD;  Location: [place];  Service: ENT;  Laterality: Bilateral;  . UPPER GASTROINTESTINAL ENDOSCOPY     Social History   Social History Narrative  . Not on file   Immunization History  Administered Date(s) Administered  . Tdap 09/18/2011     Objective: Vital Signs: There were no vitals taken for this visit.   Physical Exam   Musculoskeletal Exam: ***  CDAI Exam: CDAI Score: Not  documented Patient Global Assessment: Not documented; Provider Global Assessment: Not documented Swollen: Not documented; Tender: Not documented Joint Exam   Not documented   There is currently no information documented on the homunculus. Go to the Rheumatology activity and complete the homunculus joint exam.  Investigation: No additional findings.  Imaging: No results found.  Recent Labs: Lab Results  Component Value Date   WBC 3.9 07/06/2018   HGB 12.4 07/06/2018   PLT 329 07/06/2018   NA 138 07/06/2018   K 4.2 07/06/2018   CL 103 07/06/2018   CO2 27 07/06/2018   GLUCOSE 78 07/06/2018   BUN 15 07/06/2018   CREATININE 0.83 07/06/2018   BILITOT 0.3 07/06/2018   ALKPHOS 73 01/10/2018   AST 22 07/06/2018   ALT 17 07/06/2018   PROT 7.0 07/06/2018   ALBUMIN 3.7 01/10/2018   CALCIUM 9.4 07/06/2018   GFRAA >60 01/10/2018  Speciality Comments: No specialty comments available.  Procedures:  No procedures performed Allergies: Hydrocodone-acetaminophen; Percocet [oxycodone-acetaminophen]; and Plaquenil [hydroxychloroquine sulfate]   Assessment / Plan:     Visit Diagnoses: Sjogren's syndrome with other organ involvement ([place]) - +ANA 1:320 speckled, ENA negative  High risk medication use - d/c PLQ due to rash. Started on Imuran at last visit 07/2016  History of fatigue  Fibromyalgia  Idiopathic hypersomnolence  History of gastroesophageal reflux (GERD)  History of hypercholesterolemia   Orders: No orders of the defined types were placed in this encounter.  No orders of the defined types were placed in this encounter.   Face-to-face time spent with patient was *** minutes. Greater than 50% of time was spent in counseling and coordination of care.  Follow-Up Instructions: No follow-ups on file.   Ofilia Neas, PA-C  Note - This record has been created using Dragon software.  Chart creation errors have been sought, but may not always  have been located.  Such creation errors do not reflect on  the standard of medical care.

## 2018-07-14 ENCOUNTER — Ambulatory Visit: Payer: BLUE CROSS/BLUE SHIELD | Admitting: Rheumatology

## 2018-07-30 ENCOUNTER — Other Ambulatory Visit: Payer: Self-pay | Admitting: Family Medicine

## 2018-08-18 ENCOUNTER — Encounter (HOSPITAL_COMMUNITY): Payer: Self-pay | Admitting: Emergency Medicine

## 2018-08-18 ENCOUNTER — Emergency Department (HOSPITAL_COMMUNITY)
Admission: EM | Admit: 2018-08-18 | Discharge: 2018-08-18 | Disposition: A | Discharge status: Home / Self Care | Payer: BLUE CROSS/BLUE SHIELD | Attending: Emergency Medicine | Admitting: Emergency Medicine

## 2018-08-18 ENCOUNTER — Other Ambulatory Visit: Payer: Self-pay

## 2018-08-18 DIAGNOSIS — K859 Acute pancreatitis without necrosis or infection, unspecified: Secondary | ICD-10-CM | POA: Diagnosis not present

## 2018-08-18 DIAGNOSIS — R1111 Vomiting without nausea: Secondary | ICD-10-CM | POA: Diagnosis not present

## 2018-08-18 DIAGNOSIS — R1084 Generalized abdominal pain: Secondary | ICD-10-CM | POA: Diagnosis not present

## 2018-08-18 DIAGNOSIS — Z79899 Other long term (current) drug therapy: Secondary | ICD-10-CM | POA: Insufficient documentation

## 2018-08-18 DIAGNOSIS — R110 Nausea: Secondary | ICD-10-CM | POA: Diagnosis not present

## 2018-08-18 DIAGNOSIS — R52 Pain, unspecified: Secondary | ICD-10-CM | POA: Diagnosis not present

## 2018-08-18 DIAGNOSIS — J45909 Unspecified asthma, uncomplicated: Secondary | ICD-10-CM | POA: Insufficient documentation

## 2018-08-18 DIAGNOSIS — R1110 Vomiting, unspecified: Secondary | ICD-10-CM | POA: Diagnosis not present

## 2018-08-18 LAB — COMPREHENSIVE METABOLIC PANEL
ALT: 27 U/L (ref 0–44)
AST: 31 U/L (ref 15–41)
Albumin: 4.3 g/dL (ref 3.5–5.0)
Alkaline Phosphatase: 74 U/L (ref 38–126)
Anion gap: 8 (ref 5–15)
BUN: 15 mg/dL (ref 6–20)
CO2: 27 mmol/L (ref 22–32)
Calcium: 9.4 mg/dL (ref 8.9–10.3)
Chloride: 106 mmol/L (ref 98–111)
Creatinine, Ser: 0.59 mg/dL (ref 0.44–1.00)
GFR calc Af Amer: >60 mL/min (ref >60)
GFR calc non Af Amer: >60 mL/min (ref >60)
Glucose, Bld: 111 mg/dL — ABNORMAL HIGH (ref 70–99)
Potassium: 4.4 mmol/L (ref 3.5–5.1)
SODIUM: 141 mmol/L (ref 135–145)
Total Bilirubin: 0.3 mg/dL (ref 0.3–1.2)
Total Protein: 8.3 g/dL — ABNORMAL HIGH (ref 6.5–8.1)

## 2018-08-18 LAB — URINALYSIS, ROUTINE W REFLEX MICROSCOPIC
Bilirubin Urine: NEGATIVE
Glucose, UA: NEGATIVE mg/dL
Hgb urine dipstick: NEGATIVE
Ketones, ur: NEGATIVE mg/dL
Leukocytes,Ua: NEGATIVE
Nitrite: NEGATIVE
Protein, ur: NEGATIVE mg/dL
Specific Gravity, Urine: 1.024 (ref 1.005–1.030)
pH: 7 (ref 5.0–8.0)

## 2018-08-18 LAB — CBC WITH DIFFERENTIAL/PLATELET
Abs Immature Granulocytes: 0.02 10*3/uL (ref 0.00–0.07)
Basophils Absolute: 0 10*3/uL (ref 0.0–0.1)
Basophils Relative: 0 %
Eosinophils Absolute: 0 10*3/uL (ref 0.0–0.5)
Eosinophils Relative: 0 %
HCT: 46 % (ref 36.0–46.0)
Hemoglobin: 15 g/dL (ref 12.0–15.0)
Immature Granulocytes: 0 %
Lymphocytes Relative: 4 %
Lymphs Abs: 0.4 10*3/uL — ABNORMAL LOW (ref 0.7–4.0)
MCH: 30.9 pg (ref 26.0–34.0)
MCHC: 32.6 g/dL (ref 30.0–36.0)
MCV: 94.7 fL (ref 80.0–100.0)
Monocytes Absolute: 0.4 10*3/uL (ref 0.1–1.0)
Monocytes Relative: 5 %
Neutro Abs: 8.8 10*3/uL — ABNORMAL HIGH (ref 1.7–7.7)
Neutrophils Relative %: 91 %
Platelets: 321 10*3/uL (ref 150–400)
RBC: 4.86 MIL/uL (ref 3.87–5.11)
RDW: 12.9 % (ref 11.5–15.5)
WBC: 9.7 10*3/uL (ref 4.0–10.5)
nRBC: 0 % (ref 0.0–0.2)

## 2018-08-18 LAB — LIPASE, BLOOD: Lipase: 211 U/L — ABNORMAL HIGH (ref 11–51)

## 2018-08-18 MED ORDER — TRAMADOL HCL 50 MG PO TABS: 50.0000 mg | ORAL_TABLET | Freq: Four times a day (QID) | ORAL | 0 refills | Status: DC | PRN

## 2018-08-18 MED ORDER — ONDANSETRON HCL 4 MG/2ML IJ SOLN
4.0000 mg | Freq: Once | INTRAMUSCULAR | Status: AC
  Administered 2018-08-18: 4 mg via INTRAVENOUS
  Filled 2018-08-18: qty 2

## 2018-08-18 MED ORDER — ONDANSETRON 4 MG PO TBDP: 4.0000 mg | ORAL_TABLET | Freq: Three times a day (TID) | ORAL | 0 refills | Status: DC | PRN

## 2018-08-18 MED ORDER — HYDROCODONE-ACETAMINOPHEN 5-325 MG PO TABS: 2.0000 | ORAL_TABLET | ORAL | 0 refills | Status: DC | PRN

## 2018-08-18 MED ORDER — SODIUM CHLORIDE 0.9 % IV BOLUS
1000.0000 mL | Freq: Once | INTRAVENOUS | Status: AC
  Administered 2018-08-18: 1000 mL via INTRAVENOUS

## 2018-08-18 NOTE — ED Provider Notes (Signed)
[facility] EMERGENCY DEPARTMENT Provider Note   CSN: [id number] Arrival date & time: 08/18/18  0263    History   Chief Complaint Chief Complaint  Patient presents with  . Emesis    HPI Yvette Mayer is a 48 y.o. female.     The history is provided by the patient. No language interpreter was used.  Emesis  Severity:  Moderate Duration:  2 days Timing:  Constant Number of daily episodes:  2 Quality:  Undigested food Able to tolerate:  Liquids Progression:  Worsening Chronicity:  New Recent urination:  Normal Relieved by:  Nothing Worsened by:  Nothing Associated symptoms: abdominal pain   Associated symptoms: no fever   Risk factors: no alcohol use     Past Medical History:  Diagnosis Date  . Abdominal pain 06/12/2014  . Allergy   . Anemia    iron 1 yr ago, normal hgb 7/12  . Anxiety   . Asthma    pt says no-bronchitis  . Bloated abdomen 10/15/2015  . Chronic back pain   . Chronic headaches   . Chronic neck pain   . Chronic sinusitis   . Current use of estrogen therapy 04/16/2015  . Dyspareunia 09/12/2014  . Elevated TSH 04/16/2015  . Fatigue 04/10/2015  . Fibromyalgia   . Gas 10/15/2015  . GERD (gastroesophageal reflux disease)   . Herpes simplex virus (HSV) infection   . History of kidney stones   . Hot flashes 04/10/2015  . Hyperlipidemia   . Kidney stone on right side 10/24/2015  . Mental disorder   . Ovarian cyst, right   . Paresthesias   . Shortness of breath   . Sinus drainage   . Sjoegren syndrome   . Symptoms, such as flushing, sleeplessness, headache, lack of concentration, associated with the menopause 04/10/2015  . TMJ (dislocation of temporomandibular joint)   . Vaginal discharge 08/08/2015  . Vertigo   . Yeast infection 10/15/2015    Patient Active Problem List   Diagnosis Date Noted  . Abdominal pain 03/02/2018  . History of UTI 11/29/2017  . Dyspareunia, female 11/29/2017  . Vaginal dryness 11/29/2017  . Seasonal and perennial  allergic rhinitis 10/12/2017  . Adverse food reaction 10/12/2017  . Screening for colorectal cancer 10/06/2017  . Well woman exam with routine gynecological exam 10/06/2017  . Vaginal irritation 07/29/2017  . Vaginal atrophy 07/29/2017  . ANA positive Speckled 1:320 titer ENA negative  11/20/2016  . Leukopenia 11/11/2016  . History of bruising easily 09/08/2016  . Borderline high cholesterol 09/08/2016  . Situational mixed anxiety and depressive disorder 08/24/2016  . Rectocele 07/16/2016  . Kidney stone on right side 10/24/2015  . Vaginal discharge 08/08/2015  . Hot flashes 04/10/2015  . Symptoms, such as flushing, sleeplessness, headache, lack of concentration, associated with the menopause 04/10/2015  . 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 Dyspareunia 09/12/2014  . BV (bacterial vaginosis) 03/21/2014  . Constipation 12/26/2013  . Overweight (BMI 25.0-29.9) 12/26/2013  . Sjoegren syndrome 08/09/2013  . Insomnia 09/13/2012  . GERD (gastroesophageal reflux disease) 09/18/2011  . Anxiety 09/03/2011  . Hyperlipidemia 09/02/2011  . Fibromyalgia 09/02/2011  . Chronic nasal congestion 09/02/2011  . Vitamin D deficiency 09/02/2011  . Dysphagia 01/13/2011    Past Surgical History:  Procedure Laterality Date  . ABDOMINAL HYSTERECTOMY    . BIOPSY  09/09/2016   Procedure: BIOPSY;  Surgeon: Daneil Dolin, MD;  Location: [facility];  Service: Endoscopy;;  esophageal  . BREAST CYST EXCISION Right  1980   benign  . CESAREAN SECTION     x2  . CHOLECYSTECTOMY  2005   cholelithiasis  . COLONOSCOPY N/A 09/09/2016   Procedure: COLONOSCOPY;  Surgeon: Daneil Dolin, MD;  Location: [facility];  Service: Endoscopy;  Laterality: N/A;  8:30am  . ESOPHAGOGASTRODUODENOSCOPY  01/23/2011   BSW:HQPRFFMBWGY undulating Z-line vs short segment Barrett s/p bx (NOT BARRETT's)/small HH otherwise normal  . ESOPHAGOGASTRODUODENOSCOPY N/A 09/09/2016   Procedure: ESOPHAGOGASTRODUODENOSCOPY (EGD);  Surgeon:  Daneil Dolin, MD;  Location: [facility];  Service: Endoscopy;  Laterality: N/A;  Venia Minks DILATION  01/23/2011   Procedure: Venia Minks DILATION;  Surgeon: Daneil Dolin, MD;  Location: [facility];  Service: Endoscopy;  Laterality: N/A;  . Venia Minks DILATION N/A 09/09/2016   Procedure: Venia Minks DILATION;  Surgeon: Daneil Dolin, MD;  Location: [facility];  Service: Endoscopy;  Laterality: N/A;  . OVARIAN CYST REMOVAL  92 removal of cysts from behind ovaries  . PARTIAL HYSTERECTOMY  93  . SEPTOPLASTY     2011  . TEMPOROMANDIBULAR JOINT SURGERY    . TONSILLECTOMY     2010  . TURBINATE RESECTION Bilateral 05/29/2013   Procedure: TURBINATE RESECTION;  Surgeon: Ascencion Dike, MD;  Location: [facility];  Service: ENT;  Laterality: Bilateral;  . UPPER GASTROINTESTINAL ENDOSCOPY       OB History    Gravida  4   Para  3   Term  2   Preterm  1   AB  1   Living  3     SAB  1   TAB      Ectopic      Multiple      Live Births  3            Home Medications    Prior to Admission medications   Medication Sig Start Date End Date Taking? Authorizing Provider  Estradiol (YUVAFEM) 10 MCG TABS vaginal tablet Place 10 mcg vaginally 2 (two) times a week.   Yes [provider]  famotidine (PEPCID) 40 MG tablet Take 1 tablet by mouth at bedtime. 04/19/18  Yes [provider]  Polyvinyl Alcohol-Povidone PF (REFRESH) 1.4-0.6 % SOLN Place 1-2 drops into both eyes 3 (three) times daily as needed (for dry eyes).    Yes [provider]  traZODone (DESYREL) 50 MG tablet TAKE 0.5-1 TABLETS BY MOUTH AT BEDTIME AS NEEDED FOR SLEEP. 08/01/18  Yes Villa Pancho, Modena Nunnery, MD  triamcinolone (NASACORT ALLERGY 24HR) 55 MCG/ACT AERO nasal inhaler Place 2 sprays into the nose daily as needed (for allergies).   Yes [provider]    Family History Family History  Problem Relation Age of Onset  . Hypertension Mother   . Thyroid disease Sister         overactive  . Asthma Sister   . Eczema Sister   . Bronchitis Son   . Heart disease Maternal Grandmother   . Hypertension Maternal Grandmother   . Alcohol abuse Maternal Grandfather   . Scoliosis Son   . Other Maternal Aunt        Sjeogren syndrome  . Other Maternal Aunt        Sjoegren syndrome  . Other Maternal Aunt        Sjoegren syndrome  . Anesthesia problems Neg Hx   . Malignant hyperthermia Neg Hx   . Hypotension Neg Hx   . Pseudochol deficiency Neg Hx   . Colon cancer Neg  Hx     Social History Social History   Tobacco Use  . Smoking status: Never Smoker  . Smokeless tobacco: Never Used  Substance Use Topics  . Alcohol use: No  . Drug use: No     Allergies   Hydrocodone-acetaminophen; Percocet [oxycodone-acetaminophen]; and Plaquenil [hydroxychloroquine sulfate]   Review of Systems Review of Systems  Constitutional: Negative for fever.  Gastrointestinal: Positive for abdominal pain and vomiting.  All other systems reviewed and are negative.    Physical Exam Updated Vital Signs BP (!) 95/50   Pulse (!) 52   Temp 97.7 F (36.5 C) (Oral)   Resp 16   Ht 5\' 5"  (1.651 m)   Wt 71.7 kg   SpO2 99%   BMI 26.29 kg/m   Physical Exam Vitals signs and nursing note reviewed.  Constitutional:      Appearance: She is well-developed.  HENT:     Head: Normocephalic.     Nose: Nose normal.     Mouth/Throat:     Mouth: Mucous membranes are moist.  Eyes:     Pupils: Pupils are equal, round, and reactive to light.  Neck:     Musculoskeletal: Normal range of motion.  Cardiovascular:     Rate and Rhythm: Normal rate and regular rhythm.     Pulses: Normal pulses.  Pulmonary:     Effort: Pulmonary effort is normal.  Abdominal:     General: Abdomen is flat. There is no distension.  Musculoskeletal: Normal range of motion.  Skin:    General: Skin is warm.     Capillary Refill: Capillary refill takes less than 2 seconds.  Neurological:     General: No focal  deficit present.     Mental Status: She is alert and oriented to person, place, and time.  Psychiatric:        Mood and Affect: Mood normal.      ED Treatments / Results  Labs (all labs ordered are listed, but only abnormal results are displayed) Labs Reviewed  CBC WITH DIFFERENTIAL/PLATELET - Abnormal; Notable for the following components:      Result Value   Neutro Abs 8.8 (*)    Lymphs Abs 0.4 (*)    All other components within normal limits  COMPREHENSIVE METABOLIC PANEL - Abnormal; Notable for the following components:   Glucose, Bld 111 (*)    Total Protein 8.3 (*)    All other components within normal limits  LIPASE, BLOOD - Abnormal; Notable for the following components:   Lipase 211 (*)    All other components within normal limits  URINALYSIS, ROUTINE W REFLEX MICROSCOPIC    EKG None  Radiology No results found.  Procedures Procedures (including critical care time)  Medications Ordered in ED Medications  sodium chloride 0.9 % bolus 1,000 mL (1,000 mLs Intravenous New Bag/Given 08/18/18 1008)  ondansetron (ZOFRAN) injection 4 mg (4 mg Intravenous Given 08/18/18 1009)     Initial Impression / Assessment and Plan / ED Course  I have reviewed the triage vital signs and the nursing notes.  Pertinent labs & imaging results that were available during my care of the patient were reviewed by me and considered in my medical decision making (see chart for details).        MDM  Pt has an elevated lipase.   Pt feels better after IV fluids, pain and nausea medication    Final Clinical Impressions(s) / ED Diagnoses   Final diagnoses:  Acute pancreatitis, unspecified complication status,  unspecified pancreatitis type    ED Discharge Orders         Ordered    traMADol (ULTRAM) 50 MG tablet  Every 6 hours PRN,   Status:  Discontinued     08/18/18 1055    ondansetron (ZOFRAN ODT) 4 MG disintegrating tablet  Every 8 hours PRN     08/18/18 1055     HYDROcodone-acetaminophen (NORCO/VICODIN) 5-325 MG tablet  Every 4 hours PRN     08/18/18 1103        An After Visit Summary was printed and given to the patient.    Fransico Meadow, [place] 08/18/18 1601    Milton Ferguson, MD 08/19/18 0830

## 2018-08-18 NOTE — ED Notes (Signed)
ED Provider at bedside. 

## 2018-08-18 NOTE — ED Triage Notes (Signed)
Pt c/o n/v/d and abdominal cramping that began this morning. Denies cough, fever, or SOB. EMS reports that pt traveled to [facility] last weekend and spent some time on a boat.

## 2018-08-18 NOTE — Discharge Instructions (Addendum)
Schedule to see your Gi doctor for evaluation.  Return if any problems.    Clear liquids for 24 hours

## 2018-08-18 NOTE — ED Notes (Signed)
Phlebotomy at bedside.

## 2018-08-29 ENCOUNTER — Telehealth: Payer: Self-pay | Admitting: *Deleted

## 2018-08-29 NOTE — Telephone Encounter (Signed)
Patient called stating she is having some itching, patient is unsure if she is having a bacteria infection with a slight smell. Please advise [phone]

## 2018-08-29 NOTE — Telephone Encounter (Signed)
Attempted to call patient, no answer 

## 2018-08-29 NOTE — Telephone Encounter (Signed)
Called patient back heard busy tone.

## 2018-08-30 ENCOUNTER — Telehealth: Payer: Self-pay | Admitting: Obstetrics & Gynecology

## 2018-08-30 MED ORDER — METRONIDAZOLE 0.75 % VA GEL: VAGINAL | 0 refills | Status: DC

## 2018-08-30 NOTE — Telephone Encounter (Signed)
Patient called back, stated no one called her yesterday.  Advised her that Yvette Mayer had tried to call her and got a busy tone.  She really wants someone to call her.  [phone]

## 2018-08-30 NOTE — Telephone Encounter (Signed)
Meds ordered this encounter  Medications  . metroNIDAZOLE (METROGEL VAGINAL) 0.75 % vaginal gel    Sig: Nightly x 5 nights    Dispense:  70 g    Refill:  0

## 2018-08-30 NOTE — Telephone Encounter (Signed)
Pt reports vaginal discharge with itching. She has thin white discharge with odor. Similar to when she had bv before. Would like rx sent in.

## 2018-09-21 ENCOUNTER — Encounter: Payer: Self-pay | Admitting: Adult Health

## 2018-10-06 ENCOUNTER — Other Ambulatory Visit: Payer: Self-pay | Admitting: Adult Health

## 2018-10-17 ENCOUNTER — Other Ambulatory Visit: Payer: Self-pay

## 2018-10-21 ENCOUNTER — Other Ambulatory Visit: Payer: Self-pay

## 2018-10-21 ENCOUNTER — Ambulatory Visit (INDEPENDENT_AMBULATORY_CARE_PROVIDER_SITE_OTHER): Payer: BLUE CROSS/BLUE SHIELD | Admitting: Family Medicine

## 2018-10-21 ENCOUNTER — Other Ambulatory Visit: Payer: Self-pay | Admitting: Family Medicine

## 2018-10-21 VITALS — BP 98/60 | HR 67 | Temp 97.6°F | Resp 18 | Ht 65.0 in | Wt 159.6 lb

## 2018-10-21 DIAGNOSIS — E559 Vitamin D deficiency, unspecified: Secondary | ICD-10-CM | POA: Diagnosis not present

## 2018-10-21 DIAGNOSIS — R748 Abnormal levels of other serum enzymes: Secondary | ICD-10-CM

## 2018-10-21 DIAGNOSIS — R5382 Chronic fatigue, unspecified: Secondary | ICD-10-CM

## 2018-10-21 DIAGNOSIS — E782 Mixed hyperlipidemia: Secondary | ICD-10-CM

## 2018-10-21 MED ORDER — TRAZODONE HCL 50 MG PO TABS: 25.0000 mg | ORAL_TABLET | Freq: Every evening | ORAL | 3 refills | Status: DC | PRN

## 2018-10-21 NOTE — Patient Instructions (Signed)
We will call with lab results  F/U 6 months for Physical
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
## 2018-10-22 LAB — LIPASE: Lipase: 55 U/L (ref 7–60)

## 2018-10-22 LAB — COMPREHENSIVE METABOLIC PANEL
AG Ratio: 1.5 (calc) (ref 1.0–2.5)
ALT: 19 U/L (ref 6–29)
AST: 30 U/L (ref 10–35)
Albumin: 4.3 g/dL (ref 3.6–5.1)
Alkaline phosphatase (APISO): 71 U/L (ref 31–125)
BUN: 12 mg/dL (ref 7–25)
CO2: 26 mmol/L (ref 20–32)
Calcium: 9.4 mg/dL (ref 8.6–10.2)
Chloride: 103 mmol/L (ref 98–110)
Creat: 0.77 mg/dL (ref 0.50–1.10)
Globulin: 2.9 g/dL (calc) (ref 1.9–3.7)
Glucose, Bld: 60 mg/dL — ABNORMAL LOW (ref 65–99)
Potassium: 4.5 mmol/L (ref 3.5–5.3)
Sodium: 138 mmol/L (ref 135–146)
Total Bilirubin: 0.5 mg/dL (ref 0.2–1.2)
Total Protein: 7.2 g/dL (ref 6.1–8.1)

## 2018-10-22 LAB — CBC WITH DIFFERENTIAL/PLATELET
Absolute Monocytes: 272 cells/uL (ref 200–950)
Basophils Absolute: 80 cells/uL (ref 0–200)
Basophils Relative: 2.5 %
Eosinophils Absolute: 51 cells/uL (ref 15–500)
Eosinophils Relative: 1.6 %
HCT: 39.2 % (ref 35.0–45.0)
Hemoglobin: 13.3 g/dL (ref 11.7–15.5)
Lymphs Abs: 1757 cells/uL (ref 850–3900)
MCH: 31 pg (ref 27.0–33.0)
MCHC: 33.9 g/dL (ref 32.0–36.0)
MCV: 91.4 fL (ref 80.0–100.0)
MPV: 10.2 fL (ref 7.5–12.5)
Monocytes Relative: 8.5 %
Neutro Abs: 1040 cells/uL — ABNORMAL LOW (ref 1500–7800)
Neutrophils Relative %: 32.5 %
Platelets: 310 10*3/uL (ref 140–400)
RBC: 4.29 10*6/uL (ref 3.80–5.10)
RDW: 11.9 % (ref 11.0–15.0)
Total Lymphocyte: 54.9 %
WBC: 3.2 10*3/uL — ABNORMAL LOW (ref 3.8–10.8)

## 2018-10-22 LAB — LIPID PANEL
Cholesterol: 233 mg/dL — ABNORMAL HIGH (ref <200)
HDL: 65 mg/dL (ref >=50)
LDL Cholesterol (Calc): 154 mg/dL (calc) — ABNORMAL HIGH
Non-HDL Cholesterol (Calc): 168 mg/dL (calc) — ABNORMAL HIGH (ref <130)
Total CHOL/HDL Ratio: 3.6 (calc) (ref <5.0)
Triglycerides: 51 mg/dL (ref <150)

## 2018-10-22 LAB — VITAMIN D 25 HYDROXY (VIT D DEFICIENCY, FRACTURES): Vit D, 25-Hydroxy: 37 ng/mL (ref 30–100)

## 2018-10-23 ENCOUNTER — Encounter: Payer: Self-pay | Admitting: Family Medicine

## 2018-10-23 NOTE — Assessment & Plan Note (Signed)
Recheck Vitamin D and lipids Chronic fatigue with poor sleep stressors at home RECOMMEND she try the trazodone again, she is willing to do so If sleep does not improve may benefit from sleep study as well

## 2018-11-01 ENCOUNTER — Other Ambulatory Visit (HOSPITAL_COMMUNITY): Payer: Self-pay | Admitting: Family Medicine

## 2018-11-01 DIAGNOSIS — Z1231 Encounter for screening mammogram for malignant neoplasm of breast: Secondary | ICD-10-CM

## 2018-11-02 ENCOUNTER — Telehealth: Payer: Self-pay | Admitting: Family Medicine

## 2018-11-02 DIAGNOSIS — R5382 Chronic fatigue, unspecified: Secondary | ICD-10-CM

## 2018-11-02 NOTE — Telephone Encounter (Signed)
Referral for sleep stud placed per lov note

## 2018-11-02 NOTE — Telephone Encounter (Signed)
Pt needs [place] to place referral for neuro states the medication that was prescribed for sleep is not helping and she thinks that she would benefit from seeing specialist.

## 2018-11-08 ENCOUNTER — Ambulatory Visit (INDEPENDENT_AMBULATORY_CARE_PROVIDER_SITE_OTHER): Payer: BC Managed Care – PPO | Admitting: Family Medicine

## 2018-11-08 ENCOUNTER — Encounter: Payer: Self-pay | Admitting: Family Medicine

## 2018-11-08 ENCOUNTER — Other Ambulatory Visit: Payer: Self-pay

## 2018-11-08 VITALS — BP 110/68 | HR 61 | Temp 97.8°F | Resp 16 | Ht 65.0 in | Wt 157.0 lb

## 2018-11-08 DIAGNOSIS — Z7251 High risk heterosexual behavior: Secondary | ICD-10-CM | POA: Diagnosis not present

## 2018-11-08 DIAGNOSIS — Z202 Contact with and (suspected) exposure to infections with a predominantly sexual mode of transmission: Secondary | ICD-10-CM | POA: Diagnosis not present

## 2018-11-08 DIAGNOSIS — Z113 Encounter for screening for infections with a predominantly sexual mode of transmission: Secondary | ICD-10-CM

## 2018-11-08 LAB — WET PREP FOR TRICH, YEAST, CLUE

## 2018-11-08 MED ORDER — TRAZODONE HCL 50 MG PO TABS: 25.0000 mg | ORAL_TABLET | Freq: Every evening | ORAL | 3 refills | Status: DC | PRN

## 2018-11-08 NOTE — Progress Notes (Signed)
   Subjective:    [name], female    DOB: Jan 19, 1971, 48 y.o.   MRN: [id number]  Patient presents for Exposure to STD   Pt here to STD screening  1 month ago, had new partner with unprotected sex.  She does not have any current symptoms but would like to be checked both blood and cultures.  Has had problems with her TMJ in her throat and she has an appointment with her eye doctor for her eye dryness.  She is seeing ENT for the nasal congestion seems like they told her she may have a deviated septum recommend that she make a follow-up appointment with them for the ongoing problem.     Review Of Systems:  GEN- denies fatigue, fever, weight loss,weakness, recent illness HEENT- denies eye drainage, change in vision, nasal discharge, CVS- denies chest pain, palpitations RESP- denies SOB, cough, wheeze ABD- denies N/V, change in stools, abd pain GU- denies dysuria, hematuria, dribbling, incontinence MSK- denies joint pain, muscle aches, injury Neuro- denies headache, dizziness, syncope, seizure activity       Objective:    BP 110/68   Pulse 61   Temp 97.8 F (36.6 C) (Oral)   Resp 16   Ht 5\' 5"  (1.651 m)   Wt 157 lb (71.2 kg)   SpO2 98%   BMI 26.13 kg/m  GEN- NAD, alert and oriented x3 HEENT- PERRL, EOMI, non icteric, MMM CVS- RRR, no murmur RESP-CTAB ABD-NABS,soft,NT,ND GU- normal external genitalia, vaginal mucosa pink and moist,uterus absent, discharge in vault  no ovarian masses,  EXT- No edema Pulses- Radial, 2+        Assessment & Plan:   again recommend she try the trazodone for sleep   Problem List Items Addressed This Visit    None    Visit Diagnoses    Screen for STD (sexually transmitted disease)    -  Primary   STD screening done, blood work and vaginal cultures, discussed use of condoms with new partners   Relevant Orders   WET PREP FOR [place], [place], CLUE (Completed)   C. trachomatis/N. gonorrhoeae RNA   HIV Antibody (routine  testing w rflx)   RPR   HSV(herpes simplex vrs) 1+2 ab-IgG      Note: This dictation was prepared with Dragon dictation along with smaller Company secretary. Any transcriptional errors that result from this process are unintentional.

## 2018-11-08 NOTE — Patient Instructions (Addendum)
We will call with lab results  Call the ENT back and see the eye doctor  F/U as needed

## 2018-11-09 LAB — HIV ANTIBODY (ROUTINE TESTING W REFLEX): HIV 1&2 Ab, 4th Generation: NONREACTIVE

## 2018-11-09 LAB — C. TRACHOMATIS/N. GONORRHOEAE RNA
C. trachomatis RNA, TMA: NOT DETECTED
N. gonorrhoeae RNA, TMA: NOT DETECTED

## 2018-11-09 LAB — RPR: RPR Ser Ql: NONREACTIVE

## 2018-11-09 LAB — HSV(HERPES SIMPLEX VRS) I + II AB-IGG
HAV 1 IGG,TYPE SPECIFIC AB: 26.4 index — ABNORMAL HIGH
HSV 2 IGG,TYPE SPECIFIC AB: >23 index — ABNORMAL HIGH

## 2018-11-11 ENCOUNTER — Ambulatory Visit: Payer: BLUE CROSS/BLUE SHIELD | Admitting: Allergy & Immunology

## 2018-11-14 ENCOUNTER — Telehealth: Payer: Self-pay | Admitting: General Practice

## 2018-11-14 NOTE — Telephone Encounter (Signed)
I called the patient to reschedule appt from 6/17 to 6/24 at 2 pm due to a provider having a death in their family, no answer, lmom to call back.

## 2018-11-16 ENCOUNTER — Ambulatory Visit: Payer: BLUE CROSS/BLUE SHIELD | Admitting: Nurse Practitioner

## 2018-11-16 DIAGNOSIS — G4701 Insomnia due to medical condition: Secondary | ICD-10-CM | POA: Diagnosis not present

## 2018-11-16 DIAGNOSIS — Z79899 Other long term (current) drug therapy: Secondary | ICD-10-CM | POA: Diagnosis not present

## 2018-11-16 DIAGNOSIS — E8941 Symptomatic postprocedural ovarian failure: Secondary | ICD-10-CM | POA: Diagnosis not present

## 2018-11-16 DIAGNOSIS — N393 Stress incontinence (female) (male): Secondary | ICD-10-CM | POA: Diagnosis not present

## 2018-11-16 DIAGNOSIS — N952 Postmenopausal atrophic vaginitis: Secondary | ICD-10-CM | POA: Diagnosis not present

## 2018-11-21 ENCOUNTER — Ambulatory Visit: Payer: BC Managed Care – PPO | Admitting: Neurology

## 2018-11-21 ENCOUNTER — Encounter: Payer: Self-pay | Admitting: Neurology

## 2018-11-21 ENCOUNTER — Other Ambulatory Visit: Payer: Self-pay

## 2018-11-21 VITALS — BP 100/67 | HR 56 | Temp 97.7°F | Ht 65.0 in | Wt 158.0 lb

## 2018-11-21 DIAGNOSIS — G479 Sleep disorder, unspecified: Secondary | ICD-10-CM | POA: Diagnosis not present

## 2018-11-21 DIAGNOSIS — E663 Overweight: Secondary | ICD-10-CM

## 2018-11-21 DIAGNOSIS — R0683 Snoring: Secondary | ICD-10-CM

## 2018-11-21 DIAGNOSIS — R351 Nocturia: Secondary | ICD-10-CM

## 2018-11-21 DIAGNOSIS — R51 Headache: Secondary | ICD-10-CM

## 2018-11-21 DIAGNOSIS — R519 Headache, unspecified: Secondary | ICD-10-CM

## 2018-11-21 DIAGNOSIS — F439 Reaction to severe stress, unspecified: Secondary | ICD-10-CM | POA: Diagnosis not present

## 2018-11-21 DIAGNOSIS — G4700 Insomnia, unspecified: Secondary | ICD-10-CM

## 2018-11-21 NOTE — Progress Notes (Signed)
Subjective:    [name] is a 48 y.o. female.  HPI     Star Age, MD, PhD [facility] [address] [place], [place] [address]  Dear Dr. Buelah Manis, I saw your patient, Yvette Mayer, upon your kind request in my sleep clinic today for initial consultation of her sleep disorder, in particular, concern for underlying obstructive sleep apnea.  The patient is unaccompanied today.  As you know, Yvette Mayer is a 48 year old right-handed woman with an underlying medical history of hyperlipidemia, reflux disease, chronic back pain, asthma, anemia, allergies, anxiety, and mildly overweight state, who reports difficulty with her sleep for the past few years.  She has had more difficulty with her sleep last year when she had increase in stress and she also reports that she has had some premenopausal symptoms.  She has tried hormone replacement in patch form but stopped it.  She has recently started seeing a urologist who is checking her hormones again.  She also reports significant stress last year what with her divorce.  She recently had an improvement in her stress level, she also reports that she is trying to patch things up with her ex-husband.  She has 3 grown children, she lives alone.  She works in a Producer, television/film/video.  She has worked swing shift for 7 years, then she worked third shift for a year and a half and most recently in the past 3 months she has been working first shift, from 7 AM to 3 PM.  She has had trouble falling asleep and staying asleep.  I reviewed your office note from 10/21/2018 as well as 11/08/2018. She was on trazodone for sleep.  She reports that she tried it in the past but did not like the way it made her feel.  More recently she tried it for 2 nights but woke up with a headache and stopped it since then.  She has tried melatonin which has not helped.  She feels that relaxing sleepy time tea has helped a little bit  recently.  Her bedtime is generally around 10 and rise time around 6.  She is not aware of any family history of OSA.  She has reduced her caffeine intake.  She likes to drink sweet tea but does not drink it daily any longer.  She had a tonsillectomy in 2010.  She did snore before then and may still be snoring intermittently according to what her mom told her recently.  She is a non-smoker and does not typically drink any alcohol. Her Epworth sleepiness score is 2 out of 24, fatigue severity score is 57out of 63.  Her Past Medical History Is Significant For: Past Medical History:  Diagnosis Date  . Abdominal pain 06/12/2014  . Allergy   . Anemia    iron 1 yr ago, normal hgb 7/12  . Anxiety   . Asthma    pt says no-bronchitis  . Bloated abdomen 10/15/2015  . Chronic back pain   . Chronic headaches   . Chronic neck pain   . Chronic sinusitis   . Current use of estrogen therapy 04/16/2015  . Dyspareunia 09/12/2014  . Elevated TSH 04/16/2015  . Fatigue 04/10/2015  . Fibromyalgia   . Gas 10/15/2015  . GERD (gastroesophageal reflux disease)   . Herpes simplex virus (HSV) infection   . History of kidney stones   . Hot flashes 04/10/2015  . Hyperlipidemia   . Kidney stone on  right side 10/24/2015  . Mental disorder   . Ovarian cyst, right   . Paresthesias   . Shortness of breath   . Sinus drainage   . Sjoegren syndrome   . Symptoms, such as flushing, sleeplessness, headache, lack of concentration, associated with the menopause 04/10/2015  . TMJ (dislocation of temporomandibular joint)   . Vaginal discharge 08/08/2015  . Vertigo   . Yeast infection 10/15/2015    Her Past Surgical History Is Significant For: Past Surgical History:  Procedure Laterality Date  . ABDOMINAL HYSTERECTOMY    . BIOPSY  09/09/2016   Procedure: BIOPSY;  Surgeon: Daneil Dolin, MD;  [facility];  Service: Endoscopy;;  esophageal  . BREAST CYST EXCISION Right 1980   benign  . CESAREAN SECTION     x2   . CHOLECYSTECTOMY  2005   cholelithiasis  . COLONOSCOPY N/A 09/09/2016   Procedure: COLONOSCOPY;  Surgeon: Daneil Dolin, MD;  [facility]: [facility];  Service: Endoscopy;  Laterality: N/A;  8:30am  . ESOPHAGOGASTRODUODENOSCOPY  01/23/2011   FYB:OFBPZWCHENI undulating Z-line vs short segment Barrett s/p bx (NOT BARRETT's)/small HH otherwise normal  . ESOPHAGOGASTRODUODENOSCOPY N/A 09/09/2016   Procedure: ESOPHAGOGASTRODUODENOSCOPY (EGD);  Surgeon: Daneil Dolin, MD;  [facility];  Service: Endoscopy;  Laterality: N/A;  Venia Minks DILATION  01/23/2011   Procedure: Venia Minks DILATION;  Surgeon: Daneil Dolin, MD;  [facility];  Service: Endoscopy;  Laterality: N/A;  . Venia Minks DILATION N/A 09/09/2016   Procedure: Venia Minks DILATION;  Surgeon: Daneil Dolin, MD;  [facility];  Service: Endoscopy;  Laterality: N/A;  . OVARIAN CYST REMOVAL  92 removal of cysts from behind ovaries  . PARTIAL HYSTERECTOMY  93  . SEPTOPLASTY     2011  . TEMPOROMANDIBULAR JOINT SURGERY    . TONSILLECTOMY     2010  . TURBINATE RESECTION Bilateral 05/29/2013   Procedure: TURBINATE RESECTION;  Surgeon: Ascencion Dike, MD;  Location: [place];  Service: ENT;  Laterality: Bilateral;  . UPPER GASTROINTESTINAL ENDOSCOPY      Her Family History Is Significant For: Family History  Problem Relation Age of Onset  . Hypertension Mother   . Thyroid disease Sister        overactive  . Asthma Sister   . Eczema Sister   . Bronchitis Son   . Heart disease Maternal Grandmother   . Hypertension Maternal Grandmother   . Alcohol abuse Maternal Grandfather   . Scoliosis Son   . Other Maternal Aunt        Sjeogren syndrome  . Other Maternal Aunt        Sjoegren syndrome  . Other Maternal Aunt        Sjoegren syndrome  . Anesthesia problems Neg Hx   . Malignant hyperthermia Neg Hx   . Hypotension Neg Hx   . Pseudochol deficiency Neg Hx   . Colon cancer Neg Hx     Her  Social History Is Significant For: Social History   Socioeconomic History  . Marital status: Single    Spouse name: Not on file  . Number of children: 3  . Years of education: Not on file  . Highest education level: Not on file  Occupational History  . Occupation: Packer Transport planner: [employer]  Social Needs  . Financial resource strain: Not on file  . Food insecurity    Worry: Not on file    Inability:  Not on file  . Transportation needs    Medical: Not on file    Non-medical: Not on file  Tobacco Use  . Smoking status: Never Smoker  . Smokeless tobacco: Never Used  Substance and Sexual Activity  . Alcohol use: No  . Drug use: No  . Sexual activity: Not Currently    Birth control/protection: Surgical    Comment: hyst  Lifestyle  . Physical activity    Days per week: Not on file    Minutes per session: Not on file  . Stress: Not on file  Relationships  . Social Herbalist on phone: Not on file    Gets together: Not on file    Attends religious service: Not on file    Active member of club or organization: Not on file    Attends meetings of clubs or organizations: Not on file    Relationship status: Not on file  Other Topics Concern  . Not on file  Social History Narrative  . Not on file    Her Allergies Are:  Allergies  Allergen Reactions  . Hydrocodone-Acetaminophen Itching  . Percocet [Oxycodone-Acetaminophen] Itching  . Plaquenil [Hydroxychloroquine Sulfate] Rash  :   Her Current Medications Are:  Outpatient Encounter Medications as of 11/21/2018  Medication Sig  . [DISCONTINUED] traZODone (DESYREL) 50 MG tablet Take 0.5-1 tablets (25-50 mg total) by mouth at bedtime as needed for sleep. (Patient not taking: Reported on 11/21/2018)   No facility-administered encounter medications on file as of 11/21/2018.   :  Review of Systems:  Out of a complete 14 point review of systems, all are reviewed and negative with the exception of these  symptoms as listed below: Review of Systems  Neurological:       Patient presents today about her sleep. States she thinks she had a sleep study. Pt stipulates that she does not snore.   Epworth Sleepiness Scale 0= would never doze 1= slight chance of dozing 2= moderate chance of dozing 3= high chance of dozing  Sitting and reading:0 Watching TV:1 Sitting inactive in a public place (ex. Theater or meeting)0: As a passenger in a car for an hour without a break:0 Lying down to rest in the afternoon:1 Sitting and talking to someone:0 Sitting quietly after lunch (no alcohol):0 In a car, while stopped in traffic:0 Total:2     Objective:  Neurological Exam  Physical Exam Physical Examination:   Vitals:   11/21/18 1534  BP: 100/67  Pulse: (!) 56  Temp: 97.7 F (36.5 C)    General Examination: The patient is a very pleasant 48 y.o. female in no acute distress. She appears well-developed and well-nourished and well groomed.   HEENT: Normocephalic, atraumatic, pupils are equal, round and reactive to light and accommodation. She wears corrective eyeglasses. Extraocular tracking is good without limitation to gaze excursion or nystagmus noted. Normal smooth pursuit is noted. Hearing is grossly intact. Face is symmetric with normal facial animation and normal facial sensation. Speech is clear with no dysarthria noted. There is no hypophonia. There is no lip, neck/head, jaw or voice tremor. Neck is supple with full range of passive and active motion. There are no carotid bruits on auscultation. Oropharynx exam reveals: mild mouth dryness, adequate dental hygiene and mild airway crowding, due to Small airway entry, slightly redundant soft palate. Mallampati is class II. Tongue protrudes centrally and palate elevates symmetrically. Tonsils are absent. Neck size is 12.75 inches. She has a Mild overbite. Nasal  inspection reveals no significant nasal mucosal bogginess or redness and no septal  deviation.   Chest: Clear to auscultation without wheezing, rhonchi or crackles noted.  Heart: S1+S2+0, regular and normal without murmurs, rubs or gallops noted.   Abdomen: Soft, non-tender and non-distended with normal bowel sounds appreciated on auscultation.  Extremities: There is no pitting edema in the distal lower extremities bilaterally. Pedal pulses are intact.  Skin: Warm and dry without trophic changes noted.  Musculoskeletal: exam reveals no obvious joint deformities, tenderness or joint swelling or erythema.   Neurologically:  Mental status: The patient is awake, alert and oriented in all 4 spheres. Her immediate and remote memory, attention, language skills and fund of knowledge are appropriate. There is no evidence of aphasia, agnosia, apraxia or anomia. Speech is clear with normal prosody and enunciation. Thought process is linear. Mood is normal and affect is normal.  Cranial nerves II - XII are as described above under HEENT exam. In addition: shoulder shrug is normal with equal shoulder height noted. Motor exam: Normal bulk, strength and tone is noted. There is no drift, tremor or rebound. Romberg is negative. Reflexes are 2+ throughout. Babinski: Toes are flexor bilaterally. Fine motor skills and coordination: intact with normal finger taps, normal hand movements, normal rapid alternating patting, normal foot taps and normal foot agility.  Cerebellar testing: No dysmetria or intention tremor on finger to nose testing. Heel to shin is unremarkable bilaterally. There is no truncal or gait ataxia.  Sensory exam: intact to light touch in the upper and lower extremities.  Gait, station and balance: She stands easily. No veering to one side is noted. No leaning to one side is noted. Posture is age-appropriate and stance is narrow based. Gait shows normal stride length and normal pace. No problems turning are noted. Tandem walk is unremarkable.  Assessment and Plan:  In summary,  Yvette Mayer is a very pleasant 48 y.o.-year old female with an underlying medical history of hyperlipidemia, reflux disease, chronic back pain, asthma, anemia, allergies, anxiety, and mildly overweight state, who Presents for evaluation of her sleep difficulties including difficulty initiating and maintaining sleep as well as sleep disruption.  She reports nocturia and  Occasional morning headaches and some snoring is reported.  She does endorse that her sleep difficulty became worse when she had more stress last year.  She also has had some hormonal fluctuations and has been on hormone replacement at some point last year, recently started seeing a urologist with a follow-up pending. A sleep study would help rule out an underlying organic cause of her sleep difficulty.  To that end, I suggested we proceed with a sleep study.  We talked about sleep apnea and its treatment options.  She was advised to talk to you about stress management. I explained the risks and ramifications of untreated moderate to severe OSA, especially with respect to developing cardiovascular disease down the Road, including congestive heart failure, difficult to treat hypertension, cardiac arrhythmias, or stroke. Even type 2 diabetes has, in part, been linked to untreated OSA. Symptoms of untreated OSA include daytime sleepiness, memory problems, mood irritability and mood disorder such as depression and anxiety, lack of energy, as well as recurrent headaches, especially morning headaches. We talked about trying to maintain a healthy lifestyle in general, as well as the importance of weight control. I encouraged the patient to eat healthy, exercise daily and keep well hydrated, to keep a scheduled bedtime and wake time routine, to not skip any  meals and eat healthy snacks in between meals. I advised the patient not to drive when feeling sleepy. I recommended the following at this time: sleep study.   I explained the sleep test procedure  to the patient and also outlined possible surgical and non-surgical treatment options of OSA, including the use of a custom-made dental device (which would require a referral to a specialist dentist or oral surgeon), upper airway surgical options, such as pillar implants, radiofrequency surgery, tongue base surgery, and UPPP (which would involve a referral to an ENT surgeon). Rarely, jaw surgery such as mandibular advancement may be considered.  I also explained the CPAP treatment option to the patient, who indicated that she would be willing to try CPAP if the need arises. I explained the importance of being compliant with PAP treatment, not only for insurance purposes but primarily to improve Her symptoms, and for the patient's long term health benefit, including to reduce Her cardiovascular risks. I answered all her questions today and the patient was in agreement. I plan to see her back after the sleep study is completed and encouraged her to call with any interim questions, concerns, problems or updates.   Thank you very much for allowing me to participate in the care of this nice patient. If I can be of any further assistance to you please do not hesitate to call me at [phone].  Sincerely,   Star Age, MD, PhD

## 2018-11-21 NOTE — Patient Instructions (Signed)
Based on your symptoms and your exam I believe we should look for an underlying organic sleep disorder, such as obstructive sleep apnea (OSA) with a sleep study.  If you have more than mild OSA, I want you to consider treatment with CPAP. Please remember, the risks and ramifications of moderate to severe obstructive sleep apnea or OSA are: Cardiovascular disease, including congestive heart failure, stroke, difficult to control hypertension, arrhythmias, and even type 2 diabetes has been linked to untreated OSA. Sleep apnea causes disruption of sleep and sleep deprivation in most cases, which, in turn, can cause recurrent headaches, problems with memory, mood, concentration, focus, and vigilance. Most people with untreated sleep apnea report excessive daytime sleepiness, which can affect their ability to drive. Please do not drive if you feel sleepy.   We will call you after your sleep study to advise about the results (most likely, you will hear from Cyril Mourning, my nurse).    Our sleep lab administrative assistant, will call you to schedule your sleep study. If you don't hear back from her by about 2 weeks from now, please feel free to call her at [phone]. This is her direct line and please leave a message with your phone number to call back if you get the voicemail box. She will call back as soon as possible.   Your sleep May improve with stress management, please talk to Dr. Buelah Manis about it as well.

## 2018-11-23 ENCOUNTER — Ambulatory Visit: Payer: Self-pay | Admitting: Gastroenterology

## 2018-11-25 ENCOUNTER — Ambulatory Visit: Payer: BC Managed Care – PPO | Admitting: Family Medicine

## 2018-11-25 NOTE — Progress Notes (Deleted)
   [address]  [place] [address] Dept: [phone]  FOLLOW UP NOTE  [name], female    DOB: 05/20/1971  Age: 48 y.o. MRN: [id number] Date of Office Visit: 11/25/2018  Assessment  Chief Complaint: No chief complaint on file.  HPI Yvette Mayer is a 48 year old female who presents to the clinic for a follow up visit. She uas last seen in this clinic on 10/12/2017 by Dr Ernst Bowler for evaluation of allergic rhinitis, bilateral ear fullness, and food intolerance to milk.    Drug Allergies:  Allergies  Allergen Reactions  . Hydrocodone-Acetaminophen Itching  . Percocet [Oxycodone-Acetaminophen] Itching  . Plaquenil [Hydroxychloroquine Sulfate] Rash    Physical Exam: There were no vitals taken for this visit.   Physical Exam  Diagnostics:    Assessment and Plan: No diagnosis found.  No orders of the defined types were placed in this encounter.   There are no Patient Instructions on file for this visit.  No follow-ups on file.    Thank you for the opportunity to care for this patient.  Please do not hesitate to contact me with questions.  Gareth Morgan, FNP [facility]

## 2018-11-30 ENCOUNTER — Other Ambulatory Visit: Payer: BLUE CROSS/BLUE SHIELD | Admitting: Adult Health

## 2018-11-30 DIAGNOSIS — E8941 Symptomatic postprocedural ovarian failure: Secondary | ICD-10-CM | POA: Diagnosis not present

## 2018-11-30 DIAGNOSIS — N952 Postmenopausal atrophic vaginitis: Secondary | ICD-10-CM | POA: Diagnosis not present

## 2018-11-30 DIAGNOSIS — N393 Stress incontinence (female) (male): Secondary | ICD-10-CM | POA: Diagnosis not present

## 2018-12-12 ENCOUNTER — Other Ambulatory Visit: Payer: Self-pay

## 2018-12-12 ENCOUNTER — Ambulatory Visit (HOSPITAL_COMMUNITY)
Admission: RE | Admit: 2018-12-12 | Discharge: 2018-12-12 | Disposition: A | Discharge status: Home / Self Care | Payer: BC Managed Care – PPO | Source: Ambulatory Visit | Attending: Family Medicine | Admitting: Family Medicine

## 2018-12-12 DIAGNOSIS — Z1231 Encounter for screening mammogram for malignant neoplasm of breast: Secondary | ICD-10-CM | POA: Insufficient documentation

## 2018-12-20 ENCOUNTER — Other Ambulatory Visit: Payer: Self-pay

## 2018-12-20 ENCOUNTER — Encounter: Payer: Self-pay | Admitting: Family Medicine

## 2018-12-20 ENCOUNTER — Ambulatory Visit (INDEPENDENT_AMBULATORY_CARE_PROVIDER_SITE_OTHER): Payer: BC Managed Care – PPO | Admitting: Family Medicine

## 2018-12-20 ENCOUNTER — Ambulatory Visit: Payer: BC Managed Care – PPO | Admitting: Family Medicine

## 2018-12-20 VITALS — BP 102/68 | HR 62 | Temp 98.1°F | Resp 14 | Ht 65.0 in | Wt 161.0 lb

## 2018-12-20 DIAGNOSIS — L729 Follicular cyst of the skin and subcutaneous tissue, unspecified: Secondary | ICD-10-CM | POA: Diagnosis not present

## 2018-12-20 DIAGNOSIS — M545 Low back pain, unspecified: Secondary | ICD-10-CM

## 2018-12-20 DIAGNOSIS — M542 Cervicalgia: Secondary | ICD-10-CM | POA: Diagnosis not present

## 2018-12-20 MED ORDER — CYCLOBENZAPRINE HCL 5 MG PO TABS: 5.0000 mg | ORAL_TABLET | Freq: Every evening | ORAL | 1 refills | Status: DC | PRN

## 2018-12-20 MED ORDER — HYDROCODONE-ACETAMINOPHEN 5-325 MG PO TABS: 1.0000 | ORAL_TABLET | Freq: Four times a day (QID) | ORAL | 0 refills | Status: DC | PRN

## 2018-12-20 NOTE — Progress Notes (Signed)
   Subjective:    [name], female    DOB: Nov 27, 1970, 48 y.o.   MRN: [id number]  Patient presents for Muscle Pain (pain to neck and shoulder), Neuropathy (L sided nerve pain in foot, leg), and Cyst Like Area to Scalp (has 4 cyst like areas on scalp- possible pilar cysts)   Has some spots on her scalp present for months, has had in the past, needs appt with dermatology   Left neck and shoulder causes pain on and off, when she takes a ddep breath neck may hurt has been coming, she does get massage which helps  Left leg pain on and off for "a while", and back pain, gets spasms, no change in bowel or bladder  Worse when she comes hom past few weeks, NSAIDS, muscle rubs not helping   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  Review Of Systems:  GEN- denies fatigue, fever, weight loss,weakness, recent illness HEENT- denies eye drainage, change in vision, nasal discharge, CVS- denies chest pain, palpitations RESP- denies SOB, cough, wheeze ABD- denies N/V, change in stools, abd pain GU- denies dysuria, hematuria, dribbling, incontinence MSK- + joint pain, muscle aches, injury Neuro- denies headache, dizziness, syncope, seizure activity       Objective:    BP 102/68   Pulse 62   Temp 98.1 F (36.7 C) (Oral)   Resp 14   Ht 5\' 5"  (1.651 m)   Wt 161 lb (73 kg)   SpO2 98%   BMI 26.79 kg/m  GEN- NAD, alert and oriented x3 HEENT- PERRL, EOMI, non injected sclera, pink conjunctiva, MMM, oropharynx clear Neck- Supple, no thyromegaly TTP C spine, no spasm pain with rotationto left  CVS- RRR, no murmur RESP-CTAB MSK- mild spasm bilat lumbar paraspinals, fair ROM, neg SLR, good ROM Hips/knees EXT- No edema Neuro- normal tone LE, Sensation in tact, strength 5/5 bilat  Skin 4 discrete small- peasize cyst around scalp , no erythema Pulses- Radial, DP- 2+        Assessment & Plan:      Problem List Items Addressed This Visit    None    Visit Diagnoses    Scalp  cyst    -  Primary   Referral to dermatology for removal   Neck pain       obtain C spine xray no known autoimmune arthritis , given flexeril for spasm, norco at bedtime    Relevant Orders   DG Cervical Spine Complete   Lumbar back pain       No red flags, obtain xray    Relevant Medications   cyclobenzaprine (FLEXERIL) 5 MG tablet   HYDROcodone-acetaminophen (NORCO) 5-325 MG tablet   Other Relevant Orders   DG Lumbar Spine Complete      Note: This dictation was prepared with Dragon dictation along with smaller phrase technology. Any transcriptional errors that result from this process are unintentional.

## 2018-12-20 NOTE — Patient Instructions (Addendum)
Get xray on neck and lumbar spine  Referral to Dr. Nevada Crane for the scalp cyst  Take the muscle relaxer  F/U pending results

## 2018-12-23 ENCOUNTER — Ambulatory Visit (HOSPITAL_COMMUNITY)
Admission: RE | Admit: 2018-12-23 | Discharge: 2018-12-23 | Disposition: A | Discharge status: Home / Self Care | Payer: BC Managed Care – PPO | Source: Ambulatory Visit | Attending: Family Medicine | Admitting: Family Medicine

## 2018-12-23 ENCOUNTER — Other Ambulatory Visit: Payer: Self-pay

## 2018-12-23 DIAGNOSIS — M545 Low back pain, unspecified: Secondary | ICD-10-CM

## 2018-12-23 DIAGNOSIS — M542 Cervicalgia: Secondary | ICD-10-CM | POA: Diagnosis not present

## 2019-01-02 DIAGNOSIS — J324 Chronic pansinusitis: Secondary | ICD-10-CM | POA: Diagnosis not present

## 2019-01-11 ENCOUNTER — Encounter: Payer: Self-pay | Admitting: Nurse Practitioner

## 2019-01-11 ENCOUNTER — Other Ambulatory Visit: Payer: Self-pay

## 2019-01-11 ENCOUNTER — Ambulatory Visit: Payer: BC Managed Care – PPO | Admitting: Nurse Practitioner

## 2019-01-11 VITALS — BP 97/64 | HR 55 | Temp 96.9°F | Ht 65.0 in | Wt 156.0 lb

## 2019-01-11 DIAGNOSIS — K5900 Constipation, unspecified: Secondary | ICD-10-CM

## 2019-01-11 NOTE — Assessment & Plan Note (Signed)
The patient has had significant worsening of her constipation.  She states she has not had a bowel movement in 1 week.  She is tried MiraLAX once a day for 3 days and milk of magnesia which has not helped.  She is having abdominal discomfort related to her constipation.  Lumbar spine x-ray about 2 to 3 weeks ago demonstrated moderate stool throughout the colon, nonobstructive pattern.  No severe abdominal pain on exam today.  Doubt ileus or SBO.  At this point I will give her instructions for MiraLAX purge.  She can also use fleets enema at the end of 5 doses of MiraLAX purge if not resolved.  I will have her call us back in 1 to 2 days with results.  If no improvement we can send in a bowel prep for her.  Follow-up months.

## 2019-01-11 NOTE — Progress Notes (Signed)
Referring Provider: Alycia Rossetti, MD Primary Care Physician:  Alycia Rossetti, MD Primary GI:  Dr. Gala Romney  Chief Complaint  Patient presents with  . Constipation    last bm over 1 week ago    HPI:   Yvette Mayer is a 48 y.o. female who presents for nausea and vomiting.  The patient was last seen in our office 03/02/2018 for GERD, dysphagia, abdominal pain.  Diagnosed with Sjogren's syndrome in 2013.  EGD in 2018 found distal esophagus very short segment Barrett's status post biopsy and Maloney dilation.  Surgical pathology found the biopsies to be gastric mucosa with slight chronic irritation.  Recommended begin Protonix 40 mg daily.  Colonoscopy updated the same day which was essentially normal and recommended 10-year repeat in 2028.  CT on file and unremarkable.  At some point she felt she had parasites.  Ova and parasite exam in 2018 was negative.  At her last visit she describes dysphagia since 1 to 2 months prior, no back teeth and working on getting partials.  Symptoms occur 2-3 times a week.  Saw ENT and diagnosed with GERD and changed her from Protonix to lansoprazole and ranitidine.  Chronic constipation no worse but persistent diet dependent.  Stools vary from 1-4 on the [facility] scale.  Occasional straining.  Was previously on Linzess but at that time of her last visit it seems like she was on Amitiza that she is not really taking significantly.  She states Linzess did work well when she took it but "I do not like to take medication" and she subsequently stopped after about a month.  No other GI complaints.  Recommended restart Linzess, continue other medications, schedule EGD with possible dilation, follow-up in 4 months.  She called our office 03/22/2018 stating that she like Protonix better than Nexium and requested a refill.  This was sent to the pharmacy.  She called the morning of her scheduled upper endoscopy and canceled because her medication was working and she did  not want to reschedule.  The patient was seen in the emergency department 08/18/2018 for vomiting which was 2 days in duration described as moderate and twice daily.  It seemed to be getting worse, that was new, and associated with abdominal pain.  No fever and denies alcohol.  Labs showed elevated lipase at 211.  She felt better with IV fluids, pain medication, nausea medication and it appears she was discharged from the ED on tramadol, Zofran, hydrocodone.  The patient saw her primary care and felt her GI system was back to normal.  The lipase was rechecked and found to be normal at 55.  Today she states she is not here for nausea and vomiting. She is having constipation. States she hasn't had a bowel movement in a week. Her PCP recommended MiraLAX daily x 7 days, which did not help. She took [place] which also did not help. She states her PCP did an XRay which showed constipation. She has chronic constipation since 2016 when she started Menopause. She drinks a lot of water. States she doesn't eat enough fiber. Has generalized abdominal pain, bloating, lower back pain. Denies N/V, hematochezia, melena, fever, chills, unintentional weight loss. Denies URI or flu-like symptoms. Denies loss of sense of taste or smell. Denies chest pain, dyspnea, dizziness, lightheadedness, syncope, near syncope. Denies any other upper or lower GI symptoms.  Of note, she was previously trialed on linzess but this was d/c'd due to decline in WBC  count. Recheck after stopping Linzess showed normalization of WBC count.  Past Medical History:  Diagnosis Date  . Abdominal pain 06/12/2014  . Allergy   . Anemia    iron 1 yr ago, normal hgb 7/12  . Anxiety   . Asthma    pt says no-bronchitis  . Bloated abdomen 10/15/2015  . Chronic back pain   . Chronic headaches   . Chronic neck pain   . Chronic sinusitis   . Current use of estrogen therapy 04/16/2015  . Dyspareunia 09/12/2014  . Elevated TSH 04/16/2015  .  Fatigue 04/10/2015  . Fibromyalgia   . Gas 10/15/2015  . GERD (gastroesophageal reflux disease)   . Herpes simplex virus (HSV) infection   . History of kidney stones   . Hot flashes 04/10/2015  . Hyperlipidemia   . Kidney stone on right side 10/24/2015  . Mental disorder   . Ovarian cyst, right   . Paresthesias   . Shortness of breath   . Sinus drainage   . Sjoegren syndrome   . Symptoms, such as flushing, sleeplessness, headache, lack of concentration, associated with the menopause 04/10/2015  . TMJ (dislocation of temporomandibular joint)   . Vaginal discharge 08/08/2015  . Vertigo   . Yeast infection 10/15/2015    Past Surgical History:  Procedure Laterality Date  . ABDOMINAL HYSTERECTOMY    . BIOPSY  09/09/2016   Procedure: BIOPSY;  Surgeon: Daneil Dolin, MD;  Location: [facility];  Service: Endoscopy;;  esophageal  . BREAST CYST EXCISION Right 1980   benign  . CESAREAN SECTION     x2  . CHOLECYSTECTOMY  2005   cholelithiasis  . COLONOSCOPY N/A 09/09/2016   Procedure: COLONOSCOPY;  Surgeon: Daneil Dolin, MD;  Location: [facility];  Service: Endoscopy;  Laterality: N/A;  8:30am  . ESOPHAGOGASTRODUODENOSCOPY  01/23/2011   XIP:[id number] undulating Z-line vs short segment Barrett s/p bx (NOT BARRETT's)/small HH otherwise normal  . ESOPHAGOGASTRODUODENOSCOPY N/A 09/09/2016   Procedure: ESOPHAGOGASTRODUODENOSCOPY (EGD);  Surgeon: Daneil Dolin, MD;  Location: [facility];  Service: Endoscopy;  Laterality: N/A;  Venia Minks DILATION  01/23/2011   Procedure: Venia Minks DILATION;  Surgeon: Daneil Dolin, MD;  Location: [facility];  Service: Endoscopy;  Laterality: N/A;  . Venia Minks DILATION N/A 09/09/2016   Procedure: Venia Minks DILATION;  Surgeon: Daneil Dolin, MD;  Location: [facility];  Service: Endoscopy;  Laterality: N/A;  . OVARIAN CYST REMOVAL  92 removal of cysts from behind ovaries  . PARTIAL HYSTERECTOMY  93  . SEPTOPLASTY     2011  . TEMPOROMANDIBULAR JOINT  SURGERY    . TONSILLECTOMY     2010  . TURBINATE RESECTION Bilateral 05/29/2013   Procedure: TURBINATE RESECTION;  Surgeon: Ascencion Dike, MD;  Location: [facility];  Service: ENT;  Laterality: Bilateral;  . UPPER GASTROINTESTINAL ENDOSCOPY      Current Outpatient Medications  Medication Sig Dispense Refill  . cyclobenzaprine (FLEXERIL) 5 MG tablet Take 1 tablet (5 mg total) by mouth at bedtime as needed for muscle spasms. 30 tablet 1  . estradiol (ESTRACE) 0.5 MG tablet Take 1 tablet by mouth daily.    Marland Kitchen estradiol (ESTRACE) 1 MG tablet Take 1 mg by mouth daily.    Marland Kitchen HYDROcodone-acetaminophen (NORCO) 5-325 MG tablet Take 1 tablet by mouth every 6 (six) hours as needed for moderate pain. 20 tablet 0  . Magnesium Hydroxide (MILK OF MAGNESIA PO) Take 60 mLs by mouth as  needed.    . pantoprazole (PROTONIX) 40 MG tablet Take 40 mg by mouth daily.     No current facility-administered medications for this visit.     Allergies as of 01/11/2019 - Review Complete 01/11/2019  Allergen Reaction Noted  . Hydrocodone-acetaminophen Itching 01/13/2011  . Percocet [oxycodone-acetaminophen] Itching 01/13/2011  . Plaquenil [hydroxychloroquine sulfate] Rash 04/16/2015    Family History  Problem Relation Age of Onset  . Hypertension Mother   . Thyroid disease Sister        overactive  . Asthma Sister   . Eczema Sister   . Bronchitis Son   . Heart disease Maternal Grandmother   . Hypertension Maternal Grandmother   . Alcohol abuse Maternal Grandfather   . Scoliosis Son   . Other Maternal Aunt        Sjeogren syndrome  . Other Maternal Aunt        Sjoegren syndrome  . Other Maternal Aunt        Sjoegren syndrome  . Anesthesia problems Neg Hx   . Malignant hyperthermia Neg Hx   . Hypotension Neg Hx   . Pseudochol deficiency Neg Hx   . Colon cancer Neg Hx     Social History   Socioeconomic History  . Marital status: Single    Spouse name: Not on file  . Number of children:  3  . Years of education: Not on file  . Highest education level: Not on file  Occupational History  . Occupation: Packer Transport planner: [employer]  Social Needs  . Financial resource strain: Not on file  . Food insecurity    Worry: Not on file    Inability: Not on file  . Transportation needs    Medical: Not on file    Non-medical: Not on file  Tobacco Use  . Smoking status: Never Smoker  . Smokeless tobacco: Never Used  Substance and Sexual Activity  . Alcohol use: No  . Drug use: No  . Sexual activity: Not Currently    Birth control/protection: Surgical    Comment: hyst  Lifestyle  . Physical activity    Days per week: Not on file    Minutes per session: Not on file  . Stress: Not on file  Relationships  . Social Herbalist on phone: Not on file    Gets together: Not on file    Attends religious service: Not on file    Active member of club or organization: Not on file    Attends meetings of clubs or organizations: Not on file    Relationship status: Not on file  Other Topics Concern  . Not on file  Social History Narrative  . Not on file    Review of Systems: General: Negative for anorexia, weight loss, fever, chills, fatigue, weakness. ENT: Negative for hoarseness, difficulty swallowing. CV: Negative for chest pain, angina, palpitations, peripheral edema.  Respiratory: Negative for dyspnea at rest, cough, sputum, wheezing.  GI: See history of present illness. Endo: Negative for unusual weight change.  Heme: Negative for bruising or bleeding. Allergy: Negative for rash or hives.   Physical Exam: BP 97/64   Pulse (!) 55   Temp (!) 96.9 F (36.1 C) (Temporal)   Ht 5\' 5"  (1.651 m)   Wt 156 lb (70.8 kg)   BMI 25.96 kg/m  General:   Alert and oriented. Pleasant and cooperative. Well-nourished and well-developed.  Eyes:  Without icterus, sclera clear and conjunctiva pink.  Ears:  Normal auditory acuity. Cardiovascular:  S1, S2 present  without murmurs appreciated.  Extremities without clubbing or edema. Respiratory:  Clear to auscultation bilaterally. No wheezes, rales, or rhonchi. No distress.  Gastrointestinal:  +BS, soft, non-tender and non-distended. No HSM noted. No guarding or rebound. No masses appreciated.  Rectal:  Deferred  Musculoskalatal:  Symmetrical without gross deformities. Neurologic:  Alert and oriented x4;  grossly normal neurologically. Psych:  Alert and cooperative. Normal mood and affect. Heme/Lymph/Immune: No excessive bruising noted.    01/11/2019 4:14 PM   Disclaimer: This note was dictated with voice recognition software. Similar sounding words can inadvertently be transcribed and may not be corrected upon review.

## 2019-01-11 NOTE — Patient Instructions (Signed)
Your health issues we discussed today were:   Constipation: 1. I will give you instructions for a "MiraLAX purge" below 2. Call us in 2 days and let us know if you have not had a good result 3. If you have any severe or worsening symptoms, call our office or proceed to the emergency department   Overall I recommend:  1. Continue your other current medications 2. Follow-up in 2 months 3. Call us if you have any questions or concerns.   For Miralax purge: Take 17 gm (1 capful or 1 packet) of Miralax mixed into water, Gatorade, or Powerade followed by 8 ounces of plain water. Do this once an hour until you have a good bowel movement, but no more than 5 doses.  If no bowel movement after 5 doses, try a fleet's enema (available over the counter)  Call us if any problems   1. Because of recent events of COVID-19 ("Coronavirus"), follow CDC recommendations: 2. Wash your hand frequently 3. Avoid touching your face 4. Stay away from people who are sick 5. If you have symptoms such as fever, cough, shortness of breath then call your healthcare provider for further guidance 6. If you are sick, STAY AT HOME unless otherwise directed by your healthcare provider. 7. Follow directions from state and national officials regarding staying safe   At [facility] we value your feedback. You may receive a survey about your visit today. Please share your experience as we strive to create trusting relationships with our patients to provide genuine, compassionate, quality care.  We appreciate your understanding and patience as we review any laboratory studies, imaging, and other diagnostic tests that are ordered as we care for you. Our office policy is 5 business days for review of these results, and any emergent or urgent results are addressed in a timely manner for your best interest. If you do not hear from our office in 1 week, please contact us.   We also encourage the use of MyChart,  which contains your medical information for your review as well. If you are not enrolled in this feature, an access code is on this after visit summary for your convenience. Thank you for allowing [place] to be involved in your care.  It was great to see you today!  I hope you have a great summer!!

## 2019-01-12 ENCOUNTER — Encounter: Payer: Self-pay | Admitting: Internal Medicine

## 2019-01-12 NOTE — Progress Notes (Signed)
cc'ed to pcp °

## 2019-01-13 ENCOUNTER — Ambulatory Visit: Payer: BC Managed Care – PPO | Admitting: Allergy & Immunology

## 2019-01-15 ENCOUNTER — Other Ambulatory Visit: Payer: Self-pay

## 2019-01-15 ENCOUNTER — Encounter (HOSPITAL_COMMUNITY): Payer: Self-pay | Admitting: Emergency Medicine

## 2019-01-15 ENCOUNTER — Emergency Department (HOSPITAL_COMMUNITY)
Admission: EM | Admit: 2019-01-15 | Discharge: 2019-01-15 | Disposition: A | Discharge status: Home / Self Care | Payer: BC Managed Care – PPO | Attending: Emergency Medicine | Admitting: Emergency Medicine

## 2019-01-15 ENCOUNTER — Emergency Department (HOSPITAL_COMMUNITY): Payer: BC Managed Care – PPO

## 2019-01-15 DIAGNOSIS — K5900 Constipation, unspecified: Secondary | ICD-10-CM | POA: Diagnosis not present

## 2019-01-15 DIAGNOSIS — Z79899 Other long term (current) drug therapy: Secondary | ICD-10-CM | POA: Insufficient documentation

## 2019-01-15 DIAGNOSIS — K5904 Chronic idiopathic constipation: Secondary | ICD-10-CM | POA: Insufficient documentation

## 2019-01-15 DIAGNOSIS — R140 Abdominal distension (gaseous): Secondary | ICD-10-CM | POA: Diagnosis not present

## 2019-01-15 DIAGNOSIS — R1030 Lower abdominal pain, unspecified: Secondary | ICD-10-CM | POA: Diagnosis not present

## 2019-01-15 LAB — CBC WITH DIFFERENTIAL/PLATELET
Abs Immature Granulocytes: 0 10*3/uL (ref 0.00–0.07)
Basophils Absolute: 0.1 10*3/uL (ref 0.0–0.1)
Basophils Relative: 2 %
Eosinophils Absolute: 0 10*3/uL (ref 0.0–0.5)
Eosinophils Relative: 1 %
HCT: 40.9 % (ref 36.0–46.0)
Hemoglobin: 13.3 g/dL (ref 12.0–15.0)
Immature Granulocytes: 0 %
Lymphocytes Relative: 54 %
Lymphs Abs: 1.7 10*3/uL (ref 0.7–4.0)
MCH: 31.1 pg (ref 26.0–34.0)
MCHC: 32.5 g/dL (ref 30.0–36.0)
MCV: 95.6 fL (ref 80.0–100.0)
Monocytes Absolute: 0.4 10*3/uL (ref 0.1–1.0)
Monocytes Relative: 13 %
Neutro Abs: 0.9 10*3/uL — ABNORMAL LOW (ref 1.7–7.7)
Neutrophils Relative %: 30 %
Platelets: 296 10*3/uL (ref 150–400)
RBC: 4.28 MIL/uL (ref 3.87–5.11)
RDW: 12.4 % (ref 11.5–15.5)
WBC: 3.1 10*3/uL — ABNORMAL LOW (ref 4.0–10.5)
nRBC: 0 % (ref 0.0–0.2)

## 2019-01-15 LAB — BASIC METABOLIC PANEL
Anion gap: 8 (ref 5–15)
BUN: 12 mg/dL (ref 6–20)
CO2: 23 mmol/L (ref 22–32)
Calcium: 8.9 mg/dL (ref 8.9–10.3)
Chloride: 107 mmol/L (ref 98–111)
Creatinine, Ser: 0.66 mg/dL (ref 0.44–1.00)
GFR calc Af Amer: >60 mL/min (ref >60)
GFR calc non Af Amer: >60 mL/min (ref >60)
Glucose, Bld: 89 mg/dL (ref 70–99)
Potassium: 4.4 mmol/L (ref 3.5–5.1)
Sodium: 138 mmol/L (ref 135–145)

## 2019-01-15 MED ORDER — PEG 3350-KCL-NABCB-NACL-NASULF 236 G PO SOLR: ORAL | 0 refills | Status: DC

## 2019-01-15 MED ORDER — MAGNESIUM CITRATE PO SOLN
1.0000 | Freq: Once | ORAL | Status: AC
  Administered 2019-01-15: 14:00:00 1 via ORAL
  Filled 2019-01-15: qty 296

## 2019-01-15 NOTE — ED Triage Notes (Addendum)
Patient c/o constipation x2 weeks. Per patient hx of constipation. Patient reports using Mirilax and used over-the-counter enema with no relief. Patient states lower abd pain and back pain with nausea.

## 2019-01-15 NOTE — ED Provider Notes (Signed)
[facility] EMERGENCY DEPARTMENT Provider Note   CSN: [id number] Arrival date & time: 01/15/19  0901     History   Chief Complaint Chief Complaint  Patient presents with  . Constipation    HPI Yvette Mayer is a 48 y.o. female with a history of chronic IBS-C, Sjogrens, fibromyalgia, GERD and hyperlipidemia presenting with abdominal bloating and constipation.  She reports her last real bm occurred 2 weeks ago, since then has had small hard stools and some diarrhea stools triggered by enema (yesterday).  She additionally has taken 5 doses of miralax 3 days ago at the recommendation of her GI MD without relief.  She reports lower abdominal cramping, low back pain and nausea without emesis.  She has had no fevers or chills.  She was taken hydrocodone a month ago, but has not had this medicine since then.  She denies dysuria, hematuria or vaginal complaints.      The history is provided by the patient.    Past Medical History:  Diagnosis Date  . Abdominal pain 06/12/2014  . Allergy   . Anemia    iron 1 yr ago, normal hgb 7/12  . Anxiety   . Asthma    pt says no-bronchitis  . Bloated abdomen 10/15/2015  . Chronic back pain   . Chronic headaches   . Chronic neck pain   . Chronic sinusitis   . Current use of estrogen therapy 04/16/2015  . Dyspareunia 09/12/2014  . Elevated TSH 04/16/2015  . Fatigue 04/10/2015  . Fibromyalgia   . Gas 10/15/2015  . GERD (gastroesophageal reflux disease)   . Herpes simplex virus (HSV) infection   . History of kidney stones   . Hot flashes 04/10/2015  . Hyperlipidemia   . Kidney stone on right side 10/24/2015  . Mental disorder   . Ovarian cyst, right   . Paresthesias   . Shortness of breath   . Sinus drainage   . Sjoegren syndrome   . Symptoms, such as flushing, sleeplessness, headache, lack of concentration, associated with the menopause 04/10/2015  . TMJ (dislocation of temporomandibular joint)   . Vaginal discharge 08/08/2015  . Vertigo   .  Yeast infection 10/15/2015    Patient Active Problem List   Diagnosis Date Noted  . Abdominal pain 03/02/2018  . History of UTI 11/29/2017  . Dyspareunia, female 11/29/2017  . Vaginal dryness 11/29/2017  . Seasonal and perennial allergic rhinitis 10/12/2017  . Adverse food reaction 10/12/2017  . Screening for colorectal cancer 10/06/2017  . Well woman exam with routine gynecological exam 10/06/2017  . Vaginal irritation 07/29/2017  . Vaginal atrophy 07/29/2017  . ANA positive Speckled 1:320 titer ENA negative  11/20/2016  . Leukopenia 11/11/2016  . History of bruising easily 09/08/2016  . Borderline high cholesterol 09/08/2016  . Situational mixed anxiety and depressive disorder 08/24/2016  . Rectocele 07/16/2016  . Kidney stone on right side 10/24/2015  . Vaginal discharge 08/08/2015  . Hot flashes 04/10/2015  . Symptoms, such as flushing, sleeplessness, headache, lack of concentration, associated with the menopause 04/10/2015  . 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 Dyspareunia 09/12/2014  . BV (bacterial vaginosis) 03/21/2014  . Constipation 12/26/2013  . Overweight (BMI 25.0-29.9) 12/26/2013  . Sjoegren syndrome 08/09/2013  . Insomnia 09/13/2012  . GERD (gastroesophageal reflux disease) 09/18/2011  . Anxiety 09/03/2011  . Hyperlipidemia 09/02/2011  . Fibromyalgia 09/02/2011  . Chronic nasal congestion 09/02/2011  . Vitamin D deficiency 09/02/2011  . Dysphagia 01/13/2011    Past Surgical  History:  Procedure Laterality Date  . ABDOMINAL HYSTERECTOMY    . BIOPSY  09/09/2016   Procedure: BIOPSY;  Surgeon: Daneil Dolin, MD;  Location: [facility];  Service: Endoscopy;;  esophageal  . BREAST CYST EXCISION Right 1980   benign  . CESAREAN SECTION     x2  . CHOLECYSTECTOMY  2005   cholelithiasis  . COLONOSCOPY N/A 09/09/2016   Procedure: COLONOSCOPY;  Surgeon: Daneil Dolin, MD;  Location: [facility];  Service: Endoscopy;  Laterality: N/A;  8:30am  .  ESOPHAGOGASTRODUODENOSCOPY  01/23/2011   LKG:[id number] undulating Z-line vs short segment Barrett s/p bx (NOT BARRETT's)/small HH otherwise normal  . ESOPHAGOGASTRODUODENOSCOPY N/A 09/09/2016   Procedure: ESOPHAGOGASTRODUODENOSCOPY (EGD);  Surgeon: Daneil Dolin, MD;  Location: [facility];  Service: Endoscopy;  Laterality: N/A;  Venia Minks DILATION  01/23/2011   Procedure: Venia Minks DILATION;  Surgeon: Daneil Dolin, MD;  [facility]: [facility];  Service: Endoscopy;  Laterality: N/A;  . Venia Minks DILATION N/A 09/09/2016   Procedure: Venia Minks DILATION;  Surgeon: Daneil Dolin, MD;  Location: [facility];  Service: Endoscopy;  Laterality: N/A;  . OVARIAN CYST REMOVAL  92 removal of cysts from behind ovaries  . PARTIAL HYSTERECTOMY  93  . SEPTOPLASTY     2011  . TEMPOROMANDIBULAR JOINT SURGERY    . TONSILLECTOMY     2010  . TURBINATE RESECTION Bilateral 05/29/2013   Procedure: TURBINATE RESECTION;  Surgeon: Ascencion Dike, MD;  Location: [facility];  Service: ENT;  Laterality: Bilateral;  . UPPER GASTROINTESTINAL ENDOSCOPY       OB History    Gravida  4   Para  3   Term  2   Preterm  1   AB  1   Living  3     SAB  1   TAB      Ectopic      Multiple      Live Births  3            Home Medications    Prior to Admission medications   Medication Sig Start Date End Date Taking? Authorizing Provider  cyclobenzaprine (FLEXERIL) 5 MG tablet Take 1 tablet (5 mg total) by mouth at bedtime as needed for muscle spasms. 12/20/18   Alycia Rossetti, MD  estradiol (ESTRACE) 0.5 MG tablet Take 1 tablet by mouth daily. 12/28/18   [provider]  estradiol (ESTRACE) 1 MG tablet Take 1 mg by mouth daily.    [provider]  HYDROcodone-acetaminophen (NORCO) 5-325 MG tablet Take 1 tablet by mouth every 6 (six) hours as needed for moderate pain. 12/20/18   Alycia Rossetti, MD  Magnesium Hydroxide (MILK OF MAGNESIA PO) Take 60 mLs by mouth as needed.     [provider]  pantoprazole (PROTONIX) 40 MG tablet Take 40 mg by mouth daily.    [provider]  polyethylene glycol (GOLYTELY/NULYTELY) 236 g solution Drink 1 cup every 15 minutes x 4.  May repeat after until you have had a bowel movement. 01/15/19   Evalee Jefferson, PA-C    Family History Family History  Problem Relation Age of Onset  . Hypertension Mother   . Thyroid disease Sister        overactive  . Asthma Sister   . Eczema Sister   . Bronchitis Son   . Heart disease Maternal Grandmother   . Hypertension Maternal Grandmother   . Alcohol abuse Maternal Grandfather   .  Scoliosis Son   . Other Maternal Aunt        Sjeogren syndrome  . Other Maternal Aunt        Sjoegren syndrome  . Other Maternal Aunt        Sjoegren syndrome  . Anesthesia problems Neg Hx   . Malignant hyperthermia Neg Hx   . Hypotension Neg Hx   . Pseudochol deficiency Neg Hx   . Colon cancer Neg Hx     Social History Social History   Tobacco Use  . Smoking status: Never Smoker  . Smokeless tobacco: Never Used  Substance Use Topics  . Alcohol use: No  . Drug use: No     Allergies   Hydrocodone-acetaminophen, Percocet [oxycodone-acetaminophen], and Plaquenil [hydroxychloroquine sulfate]   Review of Systems Review of Systems  Constitutional: Negative for chills and fever.  HENT: Negative for congestion and sore throat.   Eyes: Negative.   Respiratory: Negative for chest tightness and shortness of breath.   Cardiovascular: Negative for chest pain.  Gastrointestinal: Positive for abdominal pain, constipation and nausea. Negative for anal bleeding, blood in stool, rectal pain and vomiting.  Genitourinary: Negative.  Negative for dysuria.  Musculoskeletal: Negative for arthralgias, joint swelling and neck pain.  Skin: Negative.  Negative for rash and wound.  Neurological: Negative for dizziness, weakness, light-headedness, numbness and headaches.  Psychiatric/Behavioral:  Negative.      Physical Exam Updated Vital Signs BP 102/68   Pulse (!) 58   Temp 97.7 F (36.5 C) (Oral)   Resp 18   Ht 5\' 5"  (1.651 m)   Wt 70.8 kg   SpO2 100%   BMI 25.96 kg/m   Physical Exam Vitals signs and nursing note reviewed. Exam conducted with a chaperone present.  Constitutional:      Appearance: She is well-developed.  HENT:     Head: Normocephalic and atraumatic.  Eyes:     Conjunctiva/sclera: Conjunctivae normal.  Neck:     Musculoskeletal: Normal range of motion.  Cardiovascular:     Rate and Rhythm: Normal rate and regular rhythm.     Heart sounds: Normal heart sounds.  Pulmonary:     Effort: Pulmonary effort is normal.     Breath sounds: Normal breath sounds. No wheezing.  Abdominal:     General: Abdomen is protuberant. Bowel sounds are normal.     Palpations: Abdomen is soft.     Tenderness: There is abdominal tenderness in the right lower quadrant, suprapubic area and left lower quadrant.     Comments: Mild ttp lower abdomen. Soft, no guarding or rebound.  Genitourinary:    Rectum: No mass or tenderness.     Comments: No rectal impaction. Musculoskeletal: Normal range of motion.  Skin:    General: Skin is warm and dry.  Neurological:     Mental Status: She is alert.      ED Treatments / Results  Labs (all labs ordered are listed, but only abnormal results are displayed) Labs Reviewed  CBC WITH DIFFERENTIAL/PLATELET - Abnormal; Notable for the following components:      Result Value   WBC 3.1 (*)    Neutro Abs 0.9 (*)    All other components within normal limits  BASIC METABOLIC PANEL    EKG None  Radiology Dg Abd Acute 2+v W 1v Chest  Result Date: 01/15/2019 CLINICAL DATA:  Constipation for 2 weeks, lower abdominal pain, back pain, nausea. EXAM: DG ABDOMEN ACUTE W/ 1V CHEST COMPARISON:  Chest x-ray dated 05/31/2016 plain  film of the abdomen dated 08/05/2017. FINDINGS: Single-view of the chest: Heart size and mediastinal contours  are within normal limits. Lungs are clear. No pleural effusion seen. Osseous structures about the chest are unremarkable. Bowel gas pattern is nonobstructive. Fairly large amount of stool throughout the nondistended colon. No evidence of soft tissue mass or abnormal fluid collection. No evidence of free intraperitoneal air. No evidence of renal or ureteral calculi. Cholecystectomy clips in the RIGHT upper quadrant. Visualized osseous structures of the abdomen and pelvis are unremarkable. IMPRESSION: 1. Lungs are clear. No evidence of acute cardiopulmonary abnormality. 2. Nonobstructive bowel gas pattern. Fairly large amount of stool throughout the colon, compatible with the given history of constipation. Electronically Signed   By: Franki Cabot M.D.   On: 01/15/2019 11:49    Procedures Procedures (including critical care time)  Medications Ordered in ED Medications  magnesium citrate solution 1 Bottle (1 Bottle Oral Given 01/15/19 1415)     Initial Impression / Assessment and Plan / ED Course  I have reviewed the triage vital signs and the nursing notes.  Pertinent labs & imaging results that were available during my care of the patient were reviewed by me and considered in my medical decision making (see chart for details).        Pt with chronic constipation not responsive to miralax or enemas.  She has a normal bgp without evidence for obstruction.  No impaction. She was given a bottle of magnesium citrate which she states did cause abdominal cramping and small bm but still does not feel sig relief.  Prescribed a jug of GoLytely with instructions to drink 1 cup q 15 minutes x 4, repeating after one hour if no results.  Plan f/u with GI tomorrow if constipation persists.  Abd is soft, no surgical/acute findings.   The patient appears reasonably screened and/or stabilized for discharge and I doubt any other medical condition or other [facility] requiring further screening, evaluation, or treatment  in the ED at this time prior to discharge.   Final Clinical Impressions(s) / ED Diagnoses   Final diagnoses:  Chronic idiopathic constipation    ED Discharge Orders         Ordered    polyethylene glycol (GOLYTELY/NULYTELY) 236 g solution     01/15/19 1544           Evalee Jefferson, PA-C 01/15/19 2144    Fredia Sorrow, MD 01/16/19 1524

## 2019-01-15 NOTE — ED Notes (Signed)
Patient transported to radiology

## 2019-01-16 ENCOUNTER — Telehealth: Payer: Self-pay

## 2019-01-16 NOTE — Telephone Encounter (Signed)
FYI pt was seen by EG on 01/11/2019. Pt was seen at the ED yesterday and was given a Prep purge for constipation. Pt plans on using it Saturday. If she can't get any relief, she will contact our office on Monday.

## 2019-01-17 NOTE — Telephone Encounter (Signed)
Noted, no further recommendations at this time. 

## 2019-01-18 ENCOUNTER — Telehealth: Payer: Self-pay | Admitting: Women's Health

## 2019-01-18 NOTE — Telephone Encounter (Signed)

## 2019-01-19 ENCOUNTER — Encounter: Payer: Self-pay | Admitting: Women's Health

## 2019-01-19 ENCOUNTER — Ambulatory Visit: Payer: BC Managed Care – PPO | Admitting: Women's Health

## 2019-01-19 ENCOUNTER — Other Ambulatory Visit: Payer: Self-pay

## 2019-01-19 VITALS — BP 94/55 | HR 53 | Ht 65.0 in | Wt 158.0 lb

## 2019-01-19 DIAGNOSIS — K5900 Constipation, unspecified: Secondary | ICD-10-CM | POA: Diagnosis not present

## 2019-01-19 DIAGNOSIS — N816 Rectocele: Secondary | ICD-10-CM | POA: Diagnosis not present

## 2019-01-19 DIAGNOSIS — R300 Dysuria: Secondary | ICD-10-CM

## 2019-01-19 LAB — POCT URINALYSIS DIPSTICK
Glucose, UA: NEGATIVE
Ketones, UA: NEGATIVE
Leukocytes, UA: NEGATIVE
Nitrite, UA: NEGATIVE
Protein, UA: NEGATIVE

## 2019-01-19 NOTE — Progress Notes (Signed)
   GYN VISIT Patient name: Yvette Mayer MRN [id number]  Date of birth: [birth year] Chief Complaint:   Constipation (?rectocele)  History of Present Illness:   Yvette Mayer is a 48 y.o. [phone] African American female being seen today for severe constipation. Went to ED 01/15/19 for same, given rx for Golytely, plans to do this on Saturday when she's off work. Not currently taking anything else for constipation. Had normal TCS 2018, had been on Linzess in past. Has appt w/ GI in Oct, but plans to call to move it up to get on something long term again for constipation. Was told 45yrs ago she has rectocele. Wants to see if this can be fixed.      No LMP recorded. Patient has had a hysterectomy. The current method of family planning is status post hysterectomy.  Last pap 2013. Results were:  normal Review of Systems:   Pertinent items are noted in HPI Denies fever/chills, dizziness, headaches, visual disturbances, fatigue, shortness of breath, chest pain, abdominal pain, vomiting, abnormal vaginal discharge/itching/odor/irritation, problems with periods, bowel movements, urination, or intercourse unless otherwise stated above.  Pertinent History Reviewed:  Reviewed past medical,surgical, social, obstetrical and family history.  Reviewed problem list, medications and allergies. Physical Assessment:   Vitals:   01/19/19 1539  BP: (!) 94/55  Pulse: (!) 53  Weight: 158 lb (71.7 kg)  Height: 5\' 5"  (1.651 m)  Body mass index is 26.29 kg/m.       Physical Examination:   General appearance: alert, well appearing, and in no distress  Mental status: alert, oriented to person, place, and time  Skin: warm & dry   Cardiovascular: normal heart rate noted  Respiratory: normal respiratory effort, no distress  Abdomen: tight from stool burden, non-tender   Pelvic: VULVA: normal appearing vulva with no masses, tenderness or lesions, VAGINA: normal appearing vagina with normal color and discharge, no  lesions, RECTAL: rectocele noted, co-exam w/ JVF  Extremities: no edema   Results for orders placed or performed in visit on 01/19/19 (from the past 24 hour(s))  POCT Urinalysis Dipstick   Collection Time: 01/19/19  3:42 PM  Result Value Ref Range   Color, UA     Clarity, UA     Glucose, UA Negative Negative   Bilirubin, UA     Ketones, UA neg    Spec Grav, UA     Blood, UA trace    pH, UA     Protein, UA Negative Negative   Urobilinogen, UA     Nitrite, UA neg    Leukocytes, UA Negative Negative   Appearance     Odor      Assessment & Plan:  1) Severe constipation> do GoLytely asap, start colace, miralax, increase water, fiber, activity. Make appt w/ GI asap to restart long term med for constipation  2) Rectocele> will schedule pre-op w/ JVF for 4wks from now to give constipation time to improve  Meds: No orders of the defined types were placed in this encounter.   Orders Placed This Encounter  Procedures  . POCT Urinalysis Dipstick    Return in about 4 weeks (around 02/16/2019) for pre-op w/ JVF.  [place], [facility] 01/19/2019 4:37 PM

## 2019-01-19 NOTE — Patient Instructions (Signed)

## 2019-01-20 ENCOUNTER — Ambulatory Visit (INDEPENDENT_AMBULATORY_CARE_PROVIDER_SITE_OTHER): Payer: BC Managed Care – PPO | Admitting: Neurology

## 2019-01-20 DIAGNOSIS — E663 Overweight: Secondary | ICD-10-CM

## 2019-01-20 DIAGNOSIS — R0683 Snoring: Secondary | ICD-10-CM

## 2019-01-20 DIAGNOSIS — G479 Sleep disorder, unspecified: Secondary | ICD-10-CM

## 2019-01-20 DIAGNOSIS — G4700 Insomnia, unspecified: Secondary | ICD-10-CM

## 2019-01-20 DIAGNOSIS — G478 Other sleep disorders: Secondary | ICD-10-CM

## 2019-01-20 DIAGNOSIS — R351 Nocturia: Secondary | ICD-10-CM

## 2019-01-20 DIAGNOSIS — R519 Headache, unspecified: Secondary | ICD-10-CM

## 2019-01-20 DIAGNOSIS — F439 Reaction to severe stress, unspecified: Secondary | ICD-10-CM

## 2019-01-20 DIAGNOSIS — G4720 Circadian rhythm sleep disorder, unspecified type: Secondary | ICD-10-CM

## 2019-01-26 ENCOUNTER — Telehealth: Payer: Self-pay

## 2019-01-26 NOTE — Telephone Encounter (Signed)
-----   Message from Star Age, MD sent at 01/26/2019  8:44 AM EDT ----- Patient referred by Dr. Buelah Manis, seen by me on 11/21/18, diagnostic PSG on 01/20/19.   Please call and notify the patient that the recent sleep study did not show any significant obstructive sleep apnea, no significant snoring. She achieved all stage of sleep and slept fairly well, increase in dream sleep percentage was noted; this can be seen in the context of untreated or suboptimally treated depression. (She does not indicate EDS and her ESS was low at 2.)  As I recall, she indicated recent increase in stress. Please encourage pt to FU with PCP, especially for stress management or further evaluation and treatment of possible underlying depression, if needed.  Please remind patient to try to maintain good sleep hygiene, which means: Keep a regular sleep and wake schedule and make enough time for sleep (7 1/2 to 8 1/2 hours for the average adult), try not to exercise or have a meal within 2 hours of your bedtime, try to keep your bedroom conducive for sleep, that is, cool and dark, without light distractors such as an illuminated alarm clock, and refrain from watching TV right before sleep or in the middle of the night and do not keep the TV or radio on during the night. If a nightlight is used, have it away from the visual field. Also, try not to use or play on electronic devices at bedtime, such as your cell phone, tablet PC or laptop. If you like to read at bedtime on an electronic device, try to dim the background light as much as possible. Do not eat in the middle of the night. Keep pets away from the bedroom environment. For stress relief, try meditation, deep breathing exercises (there are many books and CDs available), a white noise machine or fan can help to diffuse other noise distractors, such as traffic noise. Do not drink alcohol before bedtime, as it can disturb sleep and cause middle of the night awakenings. Never mix alcohol  and sedating medications! Avoid narcotic pain medication close to bedtime, as opioids/narcotics can suppress breathing drive and breathing effort.    Thanks,  Star Age, MD, PhD [facility])

## 2019-01-26 NOTE — Progress Notes (Signed)
Patient referred by Dr. Buelah Manis, seen by me on 11/21/18, diagnostic PSG on 01/20/19.   Please call and notify the patient that the recent sleep study did not show any significant obstructive sleep apnea, no significant snoring. She achieved all stage of sleep and slept fairly well, increase in dream sleep percentage was noted; this can be seen in the context of untreated or suboptimally treated depression. (She does not indicate EDS and her ESS was low at 2.)  As I recall, she indicated recent increase in stress. Please encourage pt to FU with PCP, especially for stress management or further evaluation and treatment of possible underlying depression, if needed.  Please remind patient to try to maintain good sleep hygiene, which means: Keep a regular sleep and wake schedule and make enough time for sleep (7 1/2 to 8 1/2 hours for the average adult), try not to exercise or have a meal within 2 hours of your bedtime, try to keep your bedroom conducive for sleep, that is, cool and dark, without light distractors such as an illuminated alarm clock, and refrain from watching TV right before sleep or in the middle of the night and do not keep the TV or radio on during the night. If a nightlight is used, have it away from the visual field. Also, try not to use or play on electronic devices at bedtime, such as your cell phone, tablet PC or laptop. If you like to read at bedtime on an electronic device, try to dim the background light as much as possible. Do not eat in the middle of the night. Keep pets away from the bedroom environment. For stress relief, try meditation, deep breathing exercises (there are many books and CDs available), a white noise machine or fan can help to diffuse other noise distractors, such as traffic noise. Do not drink alcohol before bedtime, as it can disturb sleep and cause middle of the night awakenings. Never mix alcohol and sedating medications! Avoid narcotic pain medication close to bedtime,  as opioids/narcotics can suppress breathing drive and breathing effort.    Thanks,  Star Age, MD, PhD [facility])

## 2019-01-26 NOTE — Procedures (Signed)
PATIENT'S NAME:  Yvette Mayer, Yvette Mayer DOB:      [birth year]      MR#:    [id number]     DATE OF RECORDING: 01/20/2019 REFERRING M.D.:  Vic Blackbird, MD Study Performed:   Baseline Polysomnogram HISTORY: 48 year old woman with a history of hyperlipidemia, reflux disease, chronic back pain, asthma, anemia, allergies, anxiety, and mildly overweight state, who reports difficulty with her sleep for the past few years.  She has had more difficulty with her sleep last year when she had increase in stress and she also reports that she has had some premenopausal symptoms. The patient endorsed the Epworth Sleepiness Scale at 2 points. The patient's weight 158 pounds with a height of 65 (inches), resulting in a BMI of 26.4 kg/m2. The patient's neck circumference measured 12.8 inches.  CURRENT MEDICATIONS: Flexeril, Estrace, Milk of magnesia, Protonix, Golytely, Norco   PROCEDURE:  This is a multichannel digital polysomnogram utilizing the Somnostar 11.2 system.  Electrodes and sensors were applied and monitored per AASM Specifications.   EEG, EOG, Chin and Limb EMG, were sampled at 200 Hz.  ECG, Snore and Nasal Pressure, Thermal Airflow, Respiratory Effort, CPAP Flow and Pressure, Oximetry was sampled at 50 Hz. Digital video and audio were recorded.      BASELINE STUDY  Lights Out was at 21:24 and Lights On at 04:55.  Total recording time (TRT) was 451 minutes, with a total sleep time (TST) of 407 minutes.   The patient's sleep latency was 7.5 minutes.  REM latency was 54.5 minutes, which is slightly reduced. The sleep efficiency was 90.2 %.     SLEEP ARCHITECTURE: WASO (Wake after sleep onset) was 37.5 minutes with mild sleep fragmentation noted. There were 19 minutes in Stage N1, 188.5 minutes Stage N2, 77 minutes Stage N3 and 122.5 minutes in Stage REM.  The percentage of Stage N1 was 4.7%, Stage N2 was 46.3%, which is normal, Stage N3 was 18.9%, which is normal and Stage R (REM sleep) was 30.1%, which is increased.  The arousals were noted as: 14 were spontaneous, 0 were associated with PLMs, 0 were associated with respiratory events.  RESPIRATORY ANALYSIS:  There were a total of 0 respiratory events:  0 obstructive apneas, 0 central apneas and 0 mixed apneas with a total of 0 apneas and an apnea index (AI) of 0 /hour. There were 0 hypopneas with a hypopnea index of 0 /hour. The patient also had 0 respiratory event related arousals (RERAs).   The total APNEA/HYPOPNEA INDEX (AHI) was 0 /hour and the total RESPIRATORY DISTURBANCE INDEX was 0 /hour.  0 events occurred in REM sleep and 0 events in NREM. The REM AHI was 0 /hour, versus a non-REM AHI of 0. The patient spent 222 minutes of total sleep time in the supine position and 185 minutes in non-supine.. The supine AHI was 0.0 versus a non-supine AHI of 0.0.  OXYGEN SATURATION & C02:  The Wake baseline 02 saturation was 98%, with the lowest being 91%. Time spent below 89% saturation equaled 0 minutes.  PERIODIC LIMB MOVEMENTS: The patient had a total of 0 Periodic Limb Movements.  The Periodic Limb Movement (PLM) index was 0 and the PLM Arousal index was 0/hour.  Audio and video analysis did not show any abnormal or unusual movements, behaviors, phonations or vocalizations. The patient took no bathroom breaks. No significant snoring was noted. The EKG was in keeping with normal sinus rhythm (NSR).  IMPRESSION:  1. Dysfunctions associated with sleep stages or arousal  from sleep  RECOMMENDATIONS:  1. This study does not demonstrate any significant obstructive or central sleep disordered breathing; no significant snoring was noted. This study does not support an intrinsic sleep disorder as a cause of the patient's symptoms. Other causes, including circadian rhythm disturbances, an underlying mood disorder, medication effect and/or an underlying medical problem cannot be ruled out. 2. This study shows mild sleep fragmentation and abnormal sleep stage percentages;  these are nonspecific findings and per se do not signify an intrinsic sleep disorder or a cause for the patient's sleep-related symptoms. Causes include (but are not limited to) the first night effect of the sleep study, circadian rhythm disturbances, medication effect or an underlying mood disorder or medical problem. An increase in REM percentage can be seen in patients with depression; clinical correlation is recommended.  3. The patient should be cautioned not to drive, work at heights, or operate dangerous or heavy equipment when tired or sleepy. Review and reiteration of good sleep hygiene measures should be pursued with any patient.  4. The patient will be advised to follow up with the referring provider, who will be notified of the test results.  I certify that I have reviewed the entire raw data recording prior to the issuance of this report in accordance with the Standards of Accreditation of the American Academy of Sleep Medicine (AASM)    Star Age, MD, PhD Diplomat, American Board of Neurology and Sleep Medicine (Neurology and Sleep Medicine)

## 2019-01-26 NOTE — Telephone Encounter (Signed)
I called pt to discuss her sleep study results. No answer, left a message asking her to call me back. 

## 2019-01-30 NOTE — Telephone Encounter (Signed)
The pt returned your call. She is requesting a call back at 11:00 am today as she will be on lunch and able to answer your call.

## 2019-01-30 NOTE — Telephone Encounter (Signed)
Pt has returned the call to RN Kristen, she is asking for a call back °

## 2019-01-30 NOTE — Telephone Encounter (Signed)
I called pt. I advised pt that Dr. Rexene Alberts reviewed pt's sleep study and found that did not show any significant osa, no significant snoring. Dr. Rexene Alberts recommends that pt follow up with her PCP regarding stress and depression. I reviewed sleep hygiene recommendations with the pt, including trying to keep a regular sleep wake schedule, avoiding electronics in the bedroom, keeping the bedroom cool, dark, and quiet, and avoiding eating or exercising within 2 hours of bedtime as well as eating in the middle of the night. I advised pt to keep pets out of the bedroom. I discussed with pt the importance of stress relief and to try meditation, deep breathing exercises, and/or a white noise machine or fan to diffuse other noise distractors. I advised pt to not drink alcohol before bedtime and to never mix alcohol and sedating medications. Pt was advised to avoid narcotic pain medication close to bedtime. I advised pt that a copy of these sleep study results will be sent to Dr. Buelah Manis. Pt verbalized understanding of results. Pt had no questions at this time but was encouraged to call back if questions arise.

## 2019-02-01 ENCOUNTER — Ambulatory Visit: Payer: BC Managed Care – PPO | Admitting: Nurse Practitioner

## 2019-02-03 ENCOUNTER — Other Ambulatory Visit: Payer: Self-pay

## 2019-02-03 ENCOUNTER — Ambulatory Visit (INDEPENDENT_AMBULATORY_CARE_PROVIDER_SITE_OTHER): Payer: BC Managed Care – PPO | Admitting: Allergy & Immunology

## 2019-02-03 DIAGNOSIS — H938X3 Other specified disorders of ear, bilateral: Secondary | ICD-10-CM

## 2019-02-03 DIAGNOSIS — J302 Other seasonal allergic rhinitis: Secondary | ICD-10-CM

## 2019-02-03 DIAGNOSIS — T781XXD Other adverse food reactions, not elsewhere classified, subsequent encounter: Secondary | ICD-10-CM | POA: Diagnosis not present

## 2019-02-03 DIAGNOSIS — J3089 Other allergic rhinitis: Secondary | ICD-10-CM | POA: Diagnosis not present

## 2019-02-03 MED ORDER — FEXOFENADINE HCL 180 MG PO TABS: 180.0000 mg | ORAL_TABLET | Freq: Every day | ORAL | 3 refills | Status: DC

## 2019-02-03 MED ORDER — BUDESONIDE 0.5 MG/2ML IN SUSP: 0.5000 mg | Freq: Two times a day (BID) | RESPIRATORY_TRACT | 6 refills | Status: DC

## 2019-02-03 NOTE — Patient Instructions (Addendum)
1. Chronic rhinitis (ragweed, grasses, indoor molds, outdoor molds, dust mites and cockroach) - Stop taking: Flonase - Continue taking: Allegra (fexofenadine) 180mg  daily - Start taking: budesonide mixed with saline twice daily (see recipe below) - Continue with your nasal lavage machine twice daily.  - Consider allergy shots as well, which control symptoms in 75-85% of patients and provide long-lasting immune change.  - I would give this 1-2 months to see if it is going to help with the ear fullness.  2. Concern for food allergies - We can do food testing at the next visit.  - Try to think about which foods you are concerned with to help with this visit. - Stop your Allegra and other antihistamines for three days before the next visit.   3. Return in about 4 weeks (around 03/03/2019). This can be an in-person follow up visit.   Please inform us of any Emergency Department visits, hospitalizations, or changes in symptoms. Call us before going to the ED for breathing or allergy symptoms since we might be able to fit you in for a sick visit. Feel free to contact us anytime with any questions, problems, or concerns.  It was a pleasure to talk to you today today!  Websites that have reliable patient information: 1. American Academy of Asthma, Allergy, and Immunology: www.aaaai.org 2. Food Allergy Research and Education (FARE): foodallergy.org 3. Mothers of Asthmatics: http://www.asthmacommunitynetwork.org 4. American College of Allergy, Asthma, and Immunology: www.acaai.org  "Like" [place] on Facebook and Instagram for our latest updates!      Make sure you are registered to vote! If you have moved or changed any of your contact information, you will need to get this updated before voting!  In some cases, you MAY be able to register to vote online: [email or web address]    Voter ID laws are NOT going into effect for the General Election in November 2020! DO NOT  let this stop you from exercising your right to vote!   Absentee voting is the SAFEST way to vote during the coronavirus pandemic!   Download and print an absentee ballot request form at [email or web address] or you can scan the QR code below with your smart phone:      More information on absentee ballots can be found here: [email or web address]  Budesonide (Pulmicort) + Saline Irrigation/Rinse   Budesonide (Pulmicort) is an anti-inflammatory steroid medication used to decrease nasal and sinus inflammation. It is dispensed in liquid form in a vial. Although it is manufactured for use with a nebulizer, we intend for you to use it with the NeilMed Sinus Rinse bottle (preferred) or a Neti pot.    Instructions:  1) Make 240cc of saline in the NeilMed bottle using the salt packets or your own saline recipe (see separate handout).  2) Add the entire 2cc vial of liquid Budesonide (Pulmicort) to the rinse bottle and mix together.  3) While in the shower or over the sink, tilt your head forward to a comfortable level. Put the tip of the sinus rinse bottle in your nostril and aim it towards the crown or top of your head. Gently squeeze the bottle to flush out your nose. The fluid will circulate in and out of your sinus cavities, coming back out from either nostril or through your mouth. Try not to swallow large quantities and spit it out instead.  4) Perform Budesonide (Pulmicort) + Saline irrigations 2 times daily.    Allergy Shots   Allergies are the result of a chain reaction  that starts in the immune system. Your immune system controls how your body defends itself. For instance, if you have an allergy to pollen, your immune system identifies pollen as an invader or allergen. Your immune system overreacts by producing antibodies called Immunoglobulin E (IgE). These antibodies travel to cells that release chemicals, causing an allergic reaction.  The concept behind allergy  immunotherapy, whether it is received in the form of shots or tablets, is that the immune system can be desensitized to specific allergens that trigger allergy symptoms. Although it requires time and patience, the payback can be long-term relief.  How Do Allergy Shots Work?  Allergy shots work much like a vaccine. Your body responds to injected amounts of a particular allergen given in increasing doses, eventually developing a resistance and tolerance to it. Allergy shots can lead to decreased, minimal or no allergy symptoms.  There generally are two phases: build-up and maintenance. Build-up often ranges from three to six months and involves receiving injections with increasing amounts of the allergens. The shots are typically given once or twice a week, though more rapid build-up schedules are sometimes used.  The maintenance phase begins when the most effective dose is reached. This dose is different for each person, depending on how allergic you are and your response to the build-up injections. Once the maintenance dose is reached, there are longer periods between injections, typically two to four weeks.  Occasionally doctors give cortisone-type shots that can temporarily reduce allergy symptoms. These types of shots are different and should not be confused with allergy immunotherapy shots.  Who Can Be Treated with Allergy Shots?  Allergy shots may be a good treatment approach for people with allergic rhinitis (hay fever), allergic asthma, conjunctivitis (eye allergy) or stinging insect allergy.   Before deciding to begin allergy shots, you should consider:  . The length of allergy season and the severity of your symptoms . Whether medications and/or changes to your environment can control your symptoms . Your desire to avoid long-term medication use . Time: allergy immunotherapy requires a major time commitment . Cost: may vary depending on your insurance coverage  Allergy shots for  children age 65 and older are effective and often well tolerated. They might prevent the onset of new allergen sensitivities or the progression to asthma.  Allergy shots are not started on patients who are pregnant but can be continued on patients who become pregnant while receiving them. In some patients with other medical conditions or who take certain common medications, allergy shots may be of risk. It is important to mention other medications you talk to your allergist.   When Will I Feel Better?  Some may experience decreased allergy symptoms during the build-up phase. For others, it may take as long as 12 months on the maintenance dose. If there is no improvement after a year of maintenance, your allergist will discuss other treatment options with you.  If you aren't responding to allergy shots, it may be because there is not enough dose of the allergen in your vaccine or there are missing allergens that were not identified during your allergy testing. Other reasons could be that there are high levels of the allergen in your environment or major exposure to non-allergic triggers like tobacco smoke.  What Is the Length of Treatment?  Once the maintenance dose is reached, allergy shots are generally continued for three to five years. The decision to stop should be discussed with your allergist at that time. Some people may  experience a permanent reduction of allergy symptoms. Others may relapse and a longer course of allergy shots can be considered.  What Are the Possible Reactions?  The two types of adverse reactions that can occur with allergy shots are local and systemic. Common local reactions include very mild redness and swelling at the injection site, which can happen immediately or several hours after. A systemic reaction, which is less common, affects the entire body or a particular body system. They are usually mild and typically respond quickly to medications. Signs include  increased allergy symptoms such as sneezing, a stuffy nose or hives.  Rarely, a serious systemic reaction called anaphylaxis can develop. Symptoms include swelling in the throat, wheezing, a feeling of tightness in the chest, nausea or dizziness. Most serious systemic reactions develop within 30 minutes of allergy shots. This is why it is strongly recommended you wait in your doctor's office for 30 minutes after your injections. Your allergist is trained to watch for reactions, and his or her staff is trained and equipped with the proper medications to identify and treat them.  Who Should Administer Allergy Shots?  The preferred location for receiving shots is your prescribing allergist's office. Injections can sometimes be given at another facility where the physician and staff are trained to recognize and treat reactions, and have received instructions by your prescribing allergist.

## 2019-02-03 NOTE — Progress Notes (Signed)
RE: Yvette Mayer MRN: [id number] DOB: 02/02/1971 Date of Telemedicine Visit: 02/03/2019  Referring provider: Alycia Rossetti, MD Primary care provider: Alycia Rossetti, MD  Chief Complaint: No chief complaint on file.   Telemedicine Follow Up Visit via Telephone: I connected with Yvette Mayer for a follow up on 02/03/19 by telephone and verified that I am speaking with the correct person using two identifiers.   I discussed the limitations, risks, security and privacy concerns of performing an evaluation and management service by telephone and the availability of in person appointments. I also discussed with the patient that there may be a patient responsible charge related to this service. The patient expressed understanding and agreed to proceed.  Patient is at home.  Provider is at the office.  Visit start time: 3:23 PM Visit end time: 3:43 PM Insurance consent/check in by: Anderson [place] Medical consent and medical assistant/nurse: Kayla  History of Present Illness:  She is a 48 y.o. female, who is being followed for P/SAR and Eustachian tube dusfunction. Her previous allergy office visit was in May 2019 with myself. At that visit, she had allergy testing that was positive to ragweed, grasses, indoor molds, outdoor molds, dust mites and cockroach. She was placed on Xhance as well as an antihistamine daily. WE did discuss allergen immunotherapy as a means of long term control. If this did not work, I recommended that she seek help with Dr. Janace Hoard for possible surgical intervention.   Since the last visit, she has continued to have problems. She does use her Xhance. She was using it regularly but she did not feel that it was helping. She is planning to get find a place with hardwood floors. She currently has carpeting in her apartment and this is why she needs to move. She is using Allegra but she is not using it regularly. She will start it and then stop after a time. It is unclear that  she has taken anything routinely, but regardless nothing has seemed to have worked well.   She did have a sleep study performed at the end of August 2020. Her biggest problem is nasal congestion. She also reports that she has Eustachian tube problem. She has been to multiple ENT doctors. The last one that she went to was in [place]. She was diagnosed with chronic sinusitis. She did have sinus CT in 2019 that was completely normal. 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 She has tried use a nose tube of some sort to clear her head up, maybe an "as seen on TV" item.   She does report some digestive issues. She knows that dairy "messes her up". She is interested in some food allergy testing since she thinks that this might be related to some of her issues. We never discussed food allergies at the last visit, but we did diagnose her with lactose intolerance based on her history. It seems that her symptoms have worsened since that time. She also has a known history of IBS, but does not see a gastroenterologist routinely.   Otherwise, there have been no changes to her past medical history, surgical history, family history, or social history.  Assessment and Plan:  Yvette Mayer is a 48 y.o. female with:  Seasonal and perennial allergic rhinitis (ragweed, grasses, indoor molds, outdoor molds, dust mites and cockroach)  Sensation of fullness in both ears  Adverse food reactions - milk and now others  1. Chronic rhinitis (ragweed, grasses, indoor molds, outdoor molds, dust mites and cockroach) - Stop taking: Flonase - Continue taking: Allegra (fexofenadine) 180mg  daily - Start taking: budesonide mixed with saline twice daily (see recipe below) - Continue with your nasal lavage machine twice daily.  - Consider allergy shots as well, which control symptoms in 75-85% of patients and provide long-lasting immune change.  - I would give this 1-2 months  to see if it is going to help with the ear fullness.  2. Concern for food allergies - We can do food testing at the next visit.  - Try to think about which foods you are concerned with to help with this visit. - Stop your Allegra and other antihistamines for three days before the next visit.   3. Return in about 4 weeks (around 03/03/2019). This can be an in-person follow up visit.   Diagnostics: None.  Medication List:  Current Outpatient Medications  Medication Sig Dispense Refill  . budesonide (PULMICORT) 0.5 MG/2ML nebulizer solution Take 2 mLs (0.5 mg total) by nebulization 2 (two) times daily. 120 mL 6  . estradiol (ESTRACE) 0.5 MG tablet Take 1 tablet by mouth daily.    Marland Kitchen estradiol (ESTRACE) 1 MG tablet Take 1 mg by mouth daily.    . fexofenadine (ALLEGRA ALLERGY) 180 MG tablet Take 1 tablet (180 mg total) by mouth daily. 90 tablet 3  . Magnesium Hydroxide (MILK OF MAGNESIA PO) Take 60 mLs by mouth as needed.    . pantoprazole (PROTONIX) 40 MG tablet Take 40 mg by mouth daily.    . polyethylene glycol (GOLYTELY/NULYTELY) 236 g solution Drink 1 cup every 15 minutes x 4.  May repeat after until you have had a bowel movement. 4000 mL 0   No current facility-administered medications for this visit.    Allergies: Allergies  Allergen Reactions  . Hydrocodone-Acetaminophen Itching  . Percocet [Oxycodone-Acetaminophen] Itching  . Plaquenil [Hydroxychloroquine Sulfate] Rash   I reviewed her past medical history, social history, family history, and environmental history and no significant changes have been reported from previous visits.  Review of Systems  Constitutional: Negative for activity change, appetite change, chills, diaphoresis, fatigue and fever.  HENT: Negative for congestion, ear pain, facial swelling, postnasal drip, rhinorrhea, sinus pressure and sore throat.   Eyes: Negative for pain, discharge, redness and itching.  Respiratory: Negative for shortness of breath,  wheezing and stridor.   Gastrointestinal: Positive for abdominal pain and constipation. Negative for abdominal distention, diarrhea, nausea and vomiting.  Musculoskeletal: Negative for arthralgias, joint swelling and myalgias.  Skin: Negative for rash.  Allergic/Immunologic: Positive for environmental allergies and food allergies.    Objective:  Physical exam not obtained as encounter was done via telephone.   Previous notes and tests were reviewed.  I discussed the assessment and treatment plan with the patient. The patient was provided an opportunity to ask questions and all were answered. The patient agreed with the plan and demonstrated an understanding of the instructions.   The patient was advised to call back or seek an in-person evaluation if the symptoms worsen or if the condition fails to improve as anticipated.  I provided 20 minutes of non-face-to-face time during this encounter.  It was my pleasure to participate in [place]lawn Friske's care today. Please feel free to contact me with any questions or concerns.   Sincerely,  Valentina Shaggy, MD

## 2019-02-05 ENCOUNTER — Encounter: Payer: Self-pay | Admitting: Allergy & Immunology

## 2019-02-08 ENCOUNTER — Telehealth: Payer: Self-pay

## 2019-02-08 NOTE — Telephone Encounter (Signed)
-----   Message from Valentina Shaggy, MD sent at 02/05/2019 10:04 AM EDT ----- I never made a skin testing appointment for foods. Can someone call her to nail down that appointment? She is also coming by the [place] office on Wednesday to get a NeilMed bottle (I think this is already placed at the front desk in [place] for her). Thanks much!

## 2019-02-08 NOTE — Telephone Encounter (Signed)
LVM for the patient to give [place] a call to schedule her food allergy testing.   Thanks

## 2019-02-21 ENCOUNTER — Encounter: Payer: BC Managed Care – PPO | Admitting: Obstetrics and Gynecology

## 2019-02-23 ENCOUNTER — Other Ambulatory Visit: Payer: Self-pay

## 2019-02-23 ENCOUNTER — Ambulatory Visit: Payer: BC Managed Care – PPO | Admitting: Nurse Practitioner

## 2019-02-23 ENCOUNTER — Encounter: Payer: Self-pay | Admitting: Nurse Practitioner

## 2019-02-23 VITALS — BP 100/60 | HR 67 | Temp 98.0°F | Ht 65.0 in | Wt 158.4 lb

## 2019-02-23 DIAGNOSIS — K5904 Chronic idiopathic constipation: Secondary | ICD-10-CM | POA: Diagnosis not present

## 2019-02-23 DIAGNOSIS — R1084 Generalized abdominal pain: Secondary | ICD-10-CM | POA: Diagnosis not present

## 2019-02-23 MED ORDER — LUBIPROSTONE 24 MCG PO CAPS: 24.0000 ug | ORAL_CAPSULE | Freq: Two times a day (BID) | ORAL | 1 refills | Status: DC

## 2019-02-23 NOTE — Assessment & Plan Note (Signed)
Chronic constipation.  Previously tried Linzess but this caused CBC abnormalities which resolved after stopping medication.  Has previously tried MiraLAX purge which was not effective.  A bowel prep was given to her by the emergency room helped her finally have a large stool.  Smooth move tea has helped as well.  At this point I feel she would benefit most from a daily constipation medication.  I will trial her on Amitiza 24 mcg twice daily on a full stomach/with meals.  Request progress report in 1 to 2 weeks.  If is not effective we can try another option such as Trulance.  Return for follow-up in 2 months.

## 2019-02-23 NOTE — Patient Instructions (Signed)
Your health issues we discussed today were:   Constipation and abdominal discomfort, excessive gas: 1. I have sent a prescription for Amitiza 24 mcg to your pharmacy.  Take this twice a day, with meals/on a full stomach 2. Call us in 1 to 2 weeks and let us know if it is helping your symptoms 3. If this takes a couple days to work you can still use a smooth move tea, but tried to give Amitiza a couple days to work on it sounds 4. Call us if you have any worsening or severe symptoms  Overall I recommend:  1. Continue your other current medications 2. Return for follow-up in 2 months 3. Call us if you have any questions or concerns.   Because of recent events of COVID-19 ("Coronavirus"), follow CDC recommendations:  Wash your hand frequently Avoid touching your face Stay away from people who are sick If you have symptoms such as fever, cough, shortness of breath then call your healthcare provider for further guidance If you are sick, STAY AT HOME unless otherwise directed by your healthcare provider. Follow directions from state and national officials regarding staying safe   At [facility] we value your feedback. You may receive a survey about your visit today. Please share your experience as we strive to create trusting relationships with our patients to provide genuine, compassionate, quality care.  We appreciate your understanding and patience as we review any laboratory studies, imaging, and other diagnostic tests that are ordered as we care for you. Our office policy is 5 business days for review of these results, and any emergent or urgent results are addressed in a timely manner for your best interest. If you do not hear from our office in 1 week, please contact us.   We also encourage the use of MyChart, which contains your medical information for your review as well. If you are not enrolled in this feature, an access code is on this after visit summary for your  convenience. Thank you for allowing [place] to be involved in your care.  It was great to see you today!  I hope you have a great Fall!!

## 2019-02-23 NOTE — Progress Notes (Signed)
Referring Provider: Alycia Rossetti, MD Primary Care Physician:  Alycia Rossetti, MD Primary GI:  Dr. Gala Romney  Chief Complaint  Patient presents with  . Constipation    HPI:   Yvette Mayer is a 48 y.o. female who presents for follow-up on constipation.  Patient was last seen in our office 01/11/2023 constipation.  History of Sjogren's syndrome diagnosed in 2013.  EGD on file 2018 with distal esophagus very short segment Barrett's status post biopsy and Maloney dilation.  Recommended Protonix 40 mg daily.  Colonoscopy updated the same day essentially normal and recommended 10-year repeat in 2028  CT on file and unremarkable.  Previous feeling like she had parasites with O&P exam in 2018-.  Ent4 dysphasia symptoms who diagnosed her with GERD and changed her from Protonix to lansoprazole and ranitidine.  History of chronic constipation.  Follow-up from the patient indicated Protonix was more effective and requested to be switched back and a prescription was sent to her pharmacy.  At her last visit she noted no bowel movement in 1 week, PCP recommended MiraLAX daily x7 days which did not help.  Milk of magnesia also did not help.  X-ray showed constipation, per the patient.  Chronic constipation since 2016 when she started menopause.  Drinks adequate water but does not eat enough fiber.  Generalized abdominal pain and bloating as well as low back pain associated with her constipation.  Previous trial of Linzess discontinued due to decline in white blood cell count and recheck after stopping Linzess showed normalization.  Recommended MiraLAX purge, follow-up in 2 days if no good result, continue other medications and follow-up in 2 months.  Patient did not follow-up as requested.  She called our office 01/16/2019 indicates she was in the emergency department and gave her a bowel prep for purge and she was started in a day or 2.  She will contact us if no good results.  No further communication  from the patient.  Today she states she's still constipated. Bowel prep worked. Smooth move tea has helped. Has not tried any Rx medications besides Linzess. Has a bowel movement pretty good lately but still hard stools intermittently with straining. Ranges from [place] 4. Drinks a lot of water, is exercising more, eats a good amount of fiber. Denies hematochezia, melena, fever, chills, unintentional weight loss. Has some bloating and nausea when she is constipated. Has excessive gas when constipated. Denies URI or flu-like symptoms. Denies loss of sense of taste or smell. Denies chest pain, dyspnea, dizziness, lightheadedness, syncope, near syncope. Denies any other upper or lower GI symptoms.  Past Medical History:  Diagnosis Date  . Abdominal pain 06/12/2014  . Allergy   . Anemia    iron 1 yr ago, normal hgb 7/12  . Anxiety   . Asthma    pt says no-bronchitis  . Bloated abdomen 10/15/2015  . Chronic back pain   . Chronic headaches   . Chronic neck pain   . Chronic sinusitis   . Current use of estrogen therapy 04/16/2015  . Dyspareunia 09/12/2014  . Elevated TSH 04/16/2015  . Fatigue 04/10/2015  . Fibromyalgia   . Gas 10/15/2015  . GERD (gastroesophageal reflux disease)   . Herpes simplex virus (HSV) infection   . History of kidney stones   . Hot flashes 04/10/2015  . Hyperlipidemia   . Kidney stone on right side 10/24/2015  . Mental disorder   . Ovarian cyst, right   .  Paresthesias   . Shortness of breath   . Sinus drainage   . Sjoegren syndrome   . Symptoms, such as flushing, sleeplessness, headache, lack of concentration, associated with the menopause 04/10/2015  . TMJ (dislocation of temporomandibular joint)   . Vaginal discharge 08/08/2015  . Vertigo   . Yeast infection 10/15/2015    Past Surgical History:  Procedure Laterality Date  . ABDOMINAL HYSTERECTOMY    . BIOPSY  09/09/2016   Procedure: BIOPSY;  Surgeon: Daneil Dolin, MD;  [facility];   Service: Endoscopy;;  esophageal  . BREAST CYST EXCISION Right 1980   benign  . CESAREAN SECTION     x2  . CHOLECYSTECTOMY  2005   cholelithiasis  . COLONOSCOPY N/A 09/09/2016   Procedure: COLONOSCOPY;  Surgeon: Daneil Dolin, MD;  Location: [facility];  Service: Endoscopy;  Laterality: N/A;  8:30am  . ESOPHAGOGASTRODUODENOSCOPY  01/23/2011   [id number] undulating Z-line vs short segment Barrett s/p bx (NOT BARRETT's)/small HH otherwise normal  . ESOPHAGOGASTRODUODENOSCOPY N/A 09/09/2016   Procedure: ESOPHAGOGASTRODUODENOSCOPY (EGD);  Surgeon: Daneil Dolin, MD;  [facility]: [facility];  Service: Endoscopy;  Laterality: N/A;  Venia Minks DILATION  01/23/2011   Procedure: Venia Minks DILATION;  Surgeon: Daneil Dolin, MD;  [facility]: [facility];  Service: Endoscopy;  Laterality: N/A;  . Venia Minks DILATION N/A 09/09/2016   Procedure: Venia Minks DILATION;  Surgeon: Daneil Dolin, MD;  [facility]: [facility];  Service: Endoscopy;  Laterality: N/A;  . OVARIAN CYST REMOVAL  92 removal of cysts from behind ovaries  . PARTIAL HYSTERECTOMY  93  . SEPTOPLASTY     2011  . TEMPOROMANDIBULAR JOINT SURGERY    . TONSILLECTOMY     2010  . TURBINATE RESECTION Bilateral 05/29/2013   Procedure: TURBINATE RESECTION;  Surgeon: Ascencion Dike, MD;  Location: [facility];  Service: ENT;  Laterality: Bilateral;  . UPPER GASTROINTESTINAL ENDOSCOPY      Current Outpatient Medications  Medication Sig Dispense Refill  . estradiol (ESTRACE) 0.5 MG tablet Take 1 tablet by mouth daily.    . fexofenadine (ALLEGRA ALLERGY) 180 MG tablet Take 1 tablet (180 mg total) by mouth daily. 90 tablet 3  . NON FORMULARY as needed. Move Smoothe Tea    . pantoprazole (PROTONIX) 40 MG tablet Take 40 mg by mouth daily.     No current facility-administered medications for this visit.     Allergies as of 02/23/2019 - Review Complete 02/23/2019  Allergen Reaction Noted  . Hydrocodone-acetaminophen Itching 01/13/2011   . Percocet [oxycodone-acetaminophen] Itching 01/13/2011  . Plaquenil [hydroxychloroquine sulfate] Rash 04/16/2015    Family History  Problem Relation Age of Onset  . Hypertension Mother   . Thyroid disease Sister        overactive  . Asthma Sister   . Eczema Sister   . Bronchitis Son   . Heart disease Maternal Grandmother   . Hypertension Maternal Grandmother   . Alcohol abuse Maternal Grandfather   . Scoliosis Son   . Other Maternal Aunt        Sjeogren syndrome  . Other Maternal Aunt        Sjoegren syndrome  . Other Maternal Aunt        Sjoegren syndrome  . Anesthesia problems Neg Hx   . Malignant hyperthermia Neg Hx   . Hypotension Neg Hx   . Pseudochol deficiency Neg Hx   . Colon cancer Neg Hx     Social  History   Socioeconomic History  . Marital status: Single    Spouse name: Not on file  . Number of children: 3  . Years of education: Not on file  . Highest education level: Not on file  Occupational History  . Occupation: Packer Transport planner: [employer]  Social Needs  . Financial resource strain: Not on file  . Food insecurity    Worry: Not on file    Inability: Not on file  . Transportation needs    Medical: Not on file    Non-medical: Not on file  Tobacco Use  . Smoking status: Never Smoker  . Smokeless tobacco: Never Used  Substance and Sexual Activity  . Alcohol use: No  . Drug use: No  . Sexual activity: Not Currently    Birth control/protection: Surgical    Comment: hyst  Lifestyle  . Physical activity    Days per week: Not on file    Minutes per session: Not on file  . Stress: Not on file  Relationships  . Social Herbalist on phone: Not on file    Gets together: Not on file    Attends religious service: Not on file    Active member of club or organization: Not on file    Attends meetings of clubs or organizations: Not on file    Relationship status: Not on file  Other Topics Concern  . Not on file  Social History  Narrative  . Not on file    Review of Systems: General: Negative for anorexia, weight loss, fever, chills, fatigue, weakness. ENT: Negative for hoarseness, difficulty swallowing. CV: Negative for chest pain, angina, palpitations, peripheral edema.  Respiratory: Negative for dyspnea at rest, cough, sputum, wheezing.  GI: See history of present illness. Endo: Negative for unusual weight change.  Heme: Negative for bruising or bleeding. Allergy: Negative for rash or hives.   Physical Exam: BP 100/60   Pulse 67   Temp 98 F (36.7 C) (Temporal)   Ht 5\' 5"  (1.651 m)   Wt 158 lb 6.4 oz (71.8 kg)   BMI 26.36 kg/m  General:   Alert and oriented. Pleasant and cooperative. Well-nourished and well-developed.  Eyes:  Without icterus, sclera clear and conjunctiva pink.  Ears:  Normal auditory acuity. Cardiovascular:  S1, S2 present without murmurs appreciated. Extremities without clubbing or edema. Respiratory:  Clear to auscultation bilaterally. No wheezes, rales, or rhonchi. No distress.  Gastrointestinal:  +BS, soft, non-tender and non-distended. No HSM noted. No guarding or rebound. No masses appreciated.  Rectal:  Deferred  Musculoskalatal:  Symmetrical without gross deformities. Neurologic:  Alert and oriented x4;  grossly normal neurologically. Psych:  Alert and cooperative. Normal mood and affect. Heme/Lymph/Immune: No excessive bruising noted.    02/23/2019 3:39 PM   Disclaimer: This note was dictated with voice recognition software. Similar sounding words can inadvertently be transcribed and may not be corrected upon review.

## 2019-02-23 NOTE — Assessment & Plan Note (Signed)
Abdominal discomfort, bloating, excessive flatulence associated with her constipation.  I feel when her constipation is better managed the symptoms will also improve.  See further constipation management as per above.  Call us for any worsening or severe symptoms.  Progress report on Amitiza in 1 to 2 weeks.  Other options are available that we can try as well.  Has previously tried and failed Linzess.  Follow-up in 2 months otherwise.

## 2019-02-24 ENCOUNTER — Encounter: Payer: Self-pay | Admitting: Internal Medicine

## 2019-02-28 NOTE — Progress Notes (Signed)
[place], [place] [address] Dept: [phone]  FOLLOW UP NOTE  [name], female    DOB: 1970-10-17  Age: 48 y.o. MRN: [id number] Date of Office Visit: 03/01/2019  Assessment  Chief Complaint: Allergy Testing  HPI Yvette Mayer is a 48 year old female who presents to the clinic for follow up visit. She was last seen via televisit on 02/03/2019 by Dr. Ernst Bowler for evaluation of allergic rhinitis, eustachian dysfunction, and possible food allergies. She reports that her allergic rhinitis has been poorly controlled with nasal congestion, headache, and ear fullness that has not improved for the last several years. She admits to using all of her medications intermittently and not stinking with any one medication. She is currently not taking Allegra and reports that she did not receive budesonide for the nasal rinse. Her other current medications are listed in the chart. She is concerned about food allergy due to abdominal pain and nasal congestion. She reports occasional diarrhea. She reports that she is unable to pinpoint a certain food that is causing symptoms noting that she eats all foods and symptoms can present at any time. She denies concShe is currently not taking Allegra and reports that she did not receive budesonide for the nasal rinse. Her other current medications are listed in the chart. omitent cardiopulmonary or gastrointestinal symptoms with her diarrhea and abdominal pain.    Drug Allergies:  Allergies  Allergen Reactions  . Hydrocodone-Acetaminophen Itching  . Percocet [Oxycodone-Acetaminophen] Itching  . Plaquenil [Hydroxychloroquine Sulfate] Rash    Physical Exam: BP 104/72 (BP Location: Right Arm, Patient Position: Sitting, Cuff Size: Normal)   Pulse 64   Temp 98.4 F (36.9 C) (Temporal)   Resp 17   Ht 5\' 5"  (1.651 m)   Wt 159 lb 3.2 oz (72.2 kg)   SpO2 100%   BMI 26.49 kg/m    Physical Exam Vitals signs reviewed.   Constitutional:      Appearance: Normal appearance.  HENT:     Head: Normocephalic and atraumatic.     Right Ear: Tympanic membrane normal.     Left Ear: Tympanic membrane normal.     Nose:     Comments: Bilateral nares erythematous with clear nasal drainage noted. Pharynx normal. Ears normal. Eyes normal.    Mouth/Throat:     Pharynx: Oropharynx is clear.  Eyes:     Conjunctiva/sclera: Conjunctivae normal.  Neck:     Musculoskeletal: Normal range of motion and neck supple.  Cardiovascular:     Rate and Rhythm: Normal rate and regular rhythm.     Heart sounds: Normal heart sounds. No murmur.  Pulmonary:     Effort: Pulmonary effort is normal.     Breath sounds: Normal breath sounds.     Comments: Lungs clear to auscultation Musculoskeletal: Normal range of motion.  Skin:    General: Skin is warm and dry.  Neurological:     Mental Status: She is alert and oriented to person, place, and time.  Psychiatric:        Mood and Affect: Mood normal.        Behavior: Behavior normal.        Thought Content: Thought content normal.        Judgment: Judgment normal.     Diagnostics: Patient requesting lab testing in place of skin testing for today.   Assessment and Plan: 1. Adverse food reaction, subsequent encounter   2. Seasonal and perennial allergic rhinitis  Meds ordered this encounter  Medications  . budesonide (PULMICORT) 0.5 MG/2ML nebulizer solution    Sig: Use as directed    Dispense:  60 mL    Refill:  2    Patient Instructions  1. Chronic rhinitis (ragweed, grasses, indoor molds, outdoor molds, dust mites and cockroach) - Stop taking: Flonase - Continue taking: Allegra (fexofenadine) 180mg  daily - Start taking: budesonide mixed with saline twice daily (see recipe below) - Continue with your nasal lavage machine twice daily.  - Consider allergy shots as well, which control symptoms in 75-85% of patients and provide long-lasting immune change.  - I would  give this 1-2 months to see if it is going to help with the ear fullness.  2. Concern for food allergies We will get some labs to help [place] determine your food allergies. We will call you with the results as soon as they become available.  Begin a journal of foods eaten and reaction noticed. This will help [place] pinpoint any allergy.    3. Follow up in 3 months or sooner if needed  Return in about 3 months (around 05/31/2019), or if symptoms worsen or fail to improve.    Thank you for the opportunity to care for this patient.  Please do not hesitate to contact me with questions.  Gareth Morgan, FNP [facility] [place]

## 2019-03-01 ENCOUNTER — Encounter: Payer: Self-pay | Admitting: Family Medicine

## 2019-03-01 ENCOUNTER — Ambulatory Visit: Payer: BC Managed Care – PPO | Admitting: Family Medicine

## 2019-03-01 ENCOUNTER — Other Ambulatory Visit: Payer: Self-pay

## 2019-03-01 VITALS — BP 104/72 | HR 64 | Temp 98.4°F | Resp 17 | Ht 65.0 in | Wt 159.2 lb

## 2019-03-01 DIAGNOSIS — J302 Other seasonal allergic rhinitis: Secondary | ICD-10-CM | POA: Diagnosis not present

## 2019-03-01 DIAGNOSIS — H938X3 Other specified disorders of ear, bilateral: Secondary | ICD-10-CM | POA: Insufficient documentation

## 2019-03-01 DIAGNOSIS — J3089 Other allergic rhinitis: Secondary | ICD-10-CM

## 2019-03-01 DIAGNOSIS — T781XXD Other adverse food reactions, not elsewhere classified, subsequent encounter: Secondary | ICD-10-CM | POA: Diagnosis not present

## 2019-03-01 MED ORDER — BUDESONIDE 0.5 MG/2ML IN SUSP: RESPIRATORY_TRACT | 2 refills | Status: DC

## 2019-03-01 NOTE — Patient Instructions (Addendum)
1. Chronic rhinitis (ragweed, grasses, indoor molds, outdoor molds, dust mites and cockroach) - Stop taking: Flonase - Continue taking: Allegra (fexofenadine) 180mg  daily - Start taking: budesonide mixed with saline twice daily (see recipe below) - Continue with your nasal lavage machine twice daily.  - Consider allergy shots as well, which control symptoms in 75-85% of patients and provide long-lasting immune change.  - I would give this 1-2 months to see if it is going to help with the ear fullness.  2. Concern for food allergies We will get some labs to help [place] determine your food allergies. We will call you with the results as soon as they become available.  Begin a journal of foods eaten and reaction noticed. This will help [place] pinpoint any allergy.    3. Follow up in 3 months or sooner if needed   Please inform us of any Emergency Department visits, hospitalizations, or changes in symptoms. Call us before going to the ED for breathing or allergy symptoms since we might be able to fit you in for a sick visit. Feel free to contact us anytime with any questions, problems, or concerns.  It was a pleasure to talk to you today today!  Websites that have reliable patient information: 1. American Academy of Asthma, Allergy, and Immunology: www.aaaai.org 2. Food Allergy Research and Education (FARE): foodallergy.org 3. Mothers of Asthmatics: http://www.asthmacommunitynetwork.org 4. American College of Allergy, Asthma, and Immunology: www.acaai.org  "Like" [place] on Facebook and Instagram for our latest updates!      Make sure you are registered to vote! If you have moved or changed any of your contact information, you will need to get this updated before voting!  In some cases, you MAY be able to register to vote online: [email or web address]    Voter ID laws are NOT going into effect for the General Election in November 2020! DO NOT let this stop you from  exercising your right to vote!   Absentee voting is the SAFEST way to vote during the coronavirus pandemic!   Download and print an absentee ballot request form at [email or web address] or you can scan the QR code below with your smart phone:      More information on absentee ballots can be found here: [email or web address]  Budesonide (Pulmicort) + Saline Irrigation/Rinse   Budesonide (Pulmicort) is an anti-inflammatory steroid medication used to decrease nasal and sinus inflammation. It is dispensed in liquid form in a vial. Although it is manufactured for use with a nebulizer, we intend for you to use it with the NeilMed Sinus Rinse bottle (preferred) or a Neti pot.    Instructions:  1) Make 240cc of saline in the NeilMed bottle using the salt packets or your own saline recipe (see separate handout).  2) Add the entire 2cc vial of liquid Budesonide (Pulmicort) to the rinse bottle and mix together.  3) While in the shower or over the sink, tilt your head forward to a comfortable level. Put the tip of the sinus rinse bottle in your nostril and aim it towards the crown or top of your head. Gently squeeze the bottle to flush out your nose. The fluid will circulate in and out of your sinus cavities, coming back out from either nostril or through your mouth. Try not to swallow large quantities and spit it out instead.  4) Perform Budesonide (Pulmicort) + Saline irrigations 2 times daily.    Allergy Shots   Allergies are the result of a chain reaction that starts in the  immune system. Your immune system controls how your body defends itself. For instance, if you have an allergy to pollen, your immune system identifies pollen as an invader or allergen. Your immune system overreacts by producing antibodies called Immunoglobulin E (IgE). These antibodies travel to cells that release chemicals, causing an allergic reaction.  The concept behind allergy immunotherapy, whether it is  received in the form of shots or tablets, is that the immune system can be desensitized to specific allergens that trigger allergy symptoms. Although it requires time and patience, the payback can be long-term relief.  How Do Allergy Shots Work?  Allergy shots work much like a vaccine. Your body responds to injected amounts of a particular allergen given in increasing doses, eventually developing a resistance and tolerance to it. Allergy shots can lead to decreased, minimal or no allergy symptoms.  There generally are two phases: build-up and maintenance. Build-up often ranges from three to six months and involves receiving injections with increasing amounts of the allergens. The shots are typically given once or twice a week, though more rapid build-up schedules are sometimes used.  The maintenance phase begins when the most effective dose is reached. This dose is different for each person, depending on how allergic you are and your response to the build-up injections. Once the maintenance dose is reached, there are longer periods between injections, typically two to four weeks.  Occasionally doctors give cortisone-type shots that can temporarily reduce allergy symptoms. These types of shots are different and should not be confused with allergy immunotherapy shots.  Who Can Be Treated with Allergy Shots?  Allergy shots may be a good treatment approach for people with allergic rhinitis (hay fever), allergic asthma, conjunctivitis (eye allergy) or stinging insect allergy.   Before deciding to begin allergy shots, you should consider:  . The length of allergy season and the severity of your symptoms . Whether medications and/or changes to your environment can control your symptoms . Your desire to avoid long-term medication use . Time: allergy immunotherapy requires a major time commitment . Cost: may vary depending on your insurance coverage  Allergy shots for children age 22 and older are  effective and often well tolerated. They might prevent the onset of new allergen sensitivities or the progression to asthma.  Allergy shots are not started on patients who are pregnant but can be continued on patients who become pregnant while receiving them. In some patients with other medical conditions or who take certain common medications, allergy shots may be of risk. It is important to mention other medications you talk to your allergist.   When Will I Feel Better?  Some may experience decreased allergy symptoms during the build-up phase. For others, it may take as long as 12 months on the maintenance dose. If there is no improvement after a year of maintenance, your allergist will discuss other treatment options with you.  If you aren't responding to allergy shots, it may be because there is not enough dose of the allergen in your vaccine or there are missing allergens that were not identified during your allergy testing. Other reasons could be that there are high levels of the allergen in your environment or major exposure to non-allergic triggers like tobacco smoke.  What Is the Length of Treatment?  Once the maintenance dose is reached, allergy shots are generally continued for three to five years. The decision to stop should be discussed with your allergist at that time. Some people may experience a permanent reduction  of allergy symptoms. Others may relapse and a longer course of allergy shots can be considered.  What Are the Possible Reactions?  The two types of adverse reactions that can occur with allergy shots are local and systemic. Common local reactions include very mild redness and swelling at the injection site, which can happen immediately or several hours after. A systemic reaction, which is less common, affects the entire body or a particular body system. They are usually mild and typically respond quickly to medications. Signs include increased allergy symptoms such as  sneezing, a stuffy nose or hives.  Rarely, a serious systemic reaction called anaphylaxis can develop. Symptoms include swelling in the throat, wheezing, a feeling of tightness in the chest, nausea or dizziness. Most serious systemic reactions develop within 30 minutes of allergy shots. This is why it is strongly recommended you wait in your doctor's office for 30 minutes after your injections. Your allergist is trained to watch for reactions, and his or her staff is trained and equipped with the proper medications to identify and treat them.  Who Should Administer Allergy Shots?  The preferred location for receiving shots is your prescribing allergist's office. Injections can sometimes be given at another facility where the physician and staff are trained to recognize and treat reactions, and have received instructions by your prescribing allergist.

## 2019-03-02 DIAGNOSIS — T781XXD Other adverse food reactions, not elsewhere classified, subsequent encounter: Secondary | ICD-10-CM | POA: Diagnosis not present

## 2019-03-06 ENCOUNTER — Telehealth: Payer: Self-pay | Admitting: Allergy & Immunology

## 2019-03-06 ENCOUNTER — Other Ambulatory Visit: Payer: Self-pay

## 2019-03-06 ENCOUNTER — Encounter: Payer: Self-pay | Admitting: Family Medicine

## 2019-03-06 ENCOUNTER — Ambulatory Visit: Payer: BC Managed Care – PPO | Admitting: Family Medicine

## 2019-03-06 VITALS — BP 128/64 | HR 80 | Temp 98.4°F | Resp 14 | Ht 65.0 in | Wt 159.0 lb

## 2019-03-06 DIAGNOSIS — F419 Anxiety disorder, unspecified: Secondary | ICD-10-CM

## 2019-03-06 DIAGNOSIS — F5104 Psychophysiologic insomnia: Secondary | ICD-10-CM

## 2019-03-06 DIAGNOSIS — G5600 Carpal tunnel syndrome, unspecified upper limb: Secondary | ICD-10-CM | POA: Insufficient documentation

## 2019-03-06 DIAGNOSIS — G5603 Carpal tunnel syndrome, bilateral upper limbs: Secondary | ICD-10-CM

## 2019-03-06 MED ORDER — ESCITALOPRAM OXALATE 5 MG PO TABS: 5.0000 mg | ORAL_TABLET | Freq: Every day | ORAL | 1 refills | Status: DC

## 2019-03-06 NOTE — Patient Instructions (Addendum)
Start lexapro 5mg  at bedtime  Hold the trazodone  Referral to therapist  F/U 3 weeks

## 2019-03-06 NOTE — Assessment & Plan Note (Signed)
We will start with cock-up wrist splints for her hands and see how she does before proceeding with nerve conduction study.  She is in agreement with this.

## 2019-03-06 NOTE — Assessment & Plan Note (Addendum)
Generalized anxiety with chronic insomnia.  She is willing to try different medication we will start her on Lexapro 5 mg at bedtime discussed the use of the medication.  We will hold on the trazodone for right now.  We also discussed psychotherapy states that she went to a "spiritual healer and they told her a lot of stuff about her body but she is willing to go to a therapist.  She is already on hormonal therapy for the menopausal symptoms.

## 2019-03-06 NOTE — Telephone Encounter (Signed)
Patient was notified of collecting nebulizer machine tomorrow after she gets off work.

## 2019-03-06 NOTE — Telephone Encounter (Signed)
Patient was seen 03-01-19, by Webb Silversmith in [facility]. She normally sees Dr. Ernst Bowler. She was given medication for a nebulizer, but she says she doesn't have a nebulizer.
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
## 2019-03-06 NOTE — Telephone Encounter (Signed)
Called Patient and informed her that the Budesonide is to be use in a saline rinse and not the nebulizer. Instructed patient on how to use the Budesonide. Patient verbalized understand.

## 2019-03-07 NOTE — Telephone Encounter (Signed)
Thank you :)

## 2019-03-08 LAB — ALPHA-GAL PANEL
Alpha Gal IgE*: <0.1 kU/L (ref <0.10)
Beef (Bos spp) IgE: <0.1 kU/L (ref <0.35)
Class Interpretation: 0
Class Interpretation: 0
Class Interpretation: 0
Lamb/Mutton (Ovis spp) IgE: <0.1 kU/L (ref <0.35)
Pork (Sus spp) IgE: <0.1 kU/L (ref <0.35)

## 2019-03-08 LAB — ALLERGEN, TURKEY, F284: Allergen Turkey IgE: <0.1 kU/L

## 2019-03-08 LAB — ALLERGENS(7)
Brazil Nut IgE: <0.1 kU/L
F020-IgE Almond: <0.1 kU/L
F202-IgE Cashew Nut: <0.1 kU/L
Hazelnut (Filbert) IgE: <0.1 kU/L
Pecan Nut IgE: <0.1 kU/L

## 2019-03-08 LAB — ALLERGEN PROFILE, VEGETABLE II
Allergen Carrot IgE: <0.1 kU/L
Allergen Green Bean IgE: <0.1 kU/L
Allergen Green Pea IgE: <0.1 kU/L
Allergen Onion IgE: <0.1 kU/L
Allergen Potato, White IgE: <0.1 kU/L
Allergen Tomato, IgE: <0.1 kU/L
Kidney Bean IgE: <0.1 kU/L
Pumpkin IgE: <0.1 kU/L

## 2019-03-08 LAB — ALLERGEN PROFILE, SHELLFISH
F023-IgE Crab: <0.1 kU/L
F080-IgE Lobster: <0.1 kU/L
F290-IgE Oyster: <0.1 kU/L

## 2019-03-08 LAB — ALLERGEN PROFILE, VEGETABLE I
Allergen Broccoli: <0.1 kU/L
Allergen Cabbage IgE: <0.1 kU/L
Allergen Cauliflower IgE: <0.1 kU/L
Allergen Celery IgE: <0.1 kU/L
Allergen Cucumber IgE: <0.1 kU/L
Allergen Lettuce IgE: <0.1 kU/L
F214-IgE Spinach: <0.1 kU/L

## 2019-03-08 LAB — FOOD ALLERGY PROFILE
Allergen Corn, IgE: <0.1 kU/L
Clam IgE: <0.1 kU/L
Codfish IgE: <0.1 kU/L
Egg White IgE: <0.1 kU/L
Peanut IgE: <0.1 kU/L
Scallop IgE: <0.1 kU/L
Sesame Seed IgE: <0.1 kU/L
Shrimp IgE: <0.1 kU/L
Soybean IgE: <0.1 kU/L
Walnut IgE: <0.1 kU/L
Wheat IgE: <0.1 kU/L

## 2019-03-08 LAB — ALLERGEN PROFILE, FOOD-FISH
Allergen Mackerel IgE: <0.1 kU/L
Allergen Salmon IgE: <0.1 kU/L
Allergen Trout IgE: <0.1 kU/L
Allergen Walley Pike IgE: <0.1 kU/L
Halibut IgE: <0.1 kU/L
Tuna: <0.1 kU/L

## 2019-03-08 LAB — ALLERGEN, CHICKEN F83: Chicken IgE: <0.1 kU/L

## 2019-03-08 LAB — ALLERGEN PROFILE, FOOD-CITRUS
Allergen Grapefruit IgE: <0.1 kU/L
Allergen Lime IgE: <0.1 kU/L
Lemon: <0.1 kU/L
Orange: <0.1 kU/L
Tangerine IgE: <0.1 kU/L

## 2019-03-08 LAB — K083-IGE COTTONSEED: K083-IgE Cottonseed: <0.1 kU/L

## 2019-03-08 LAB — ALLERGEN PROFILE, FOOD-FRUIT
Allergen Apple, IgE: <0.1 kU/L
Allergen Banana IgE: <0.1 kU/L
Allergen Grape IgE: <0.1 kU/L
Allergen Pear IgE: <0.1 kU/L
Allergen, Peach f95: <0.1 kU/L

## 2019-03-08 LAB — ALLERGEN, KIWI FRUIT, F84: Kiwi Fruit: <0.1 kU/L

## 2019-03-08 LAB — ALLERGEN COCONUT IGE: Allergen Coconut IgE: <0.1 kU/L

## 2019-03-08 LAB — IGE MILK W/ COMPONENT REFLEX: F002-IgE Milk: <0.1 kU/L

## 2019-03-08 LAB — ALLERGEN CHOCOLATE: Chocolate/Cacao IgE: <0.1 kU/L

## 2019-03-08 LAB — ALLERGEN, STRAWBERRY, F44: Allergen Strawberry IgE: <0.1 kU/L

## 2019-03-08 LAB — ALLERGEN, PINEAPPLE, F210: Pineapple IgE: <0.1 kU/L

## 2019-03-08 LAB — ALLERGEN BARLEY F6: Allergen Barley IgE: <0.1 kU/L

## 2019-03-08 LAB — ALLERGEN PISTACHIO F203: F203-IgE Pistachio Nut: <0.1 kU/L

## 2019-03-08 LAB — ALLERGEN, CASEIN, F78: F078-IgE Casein: <0.1 kU/L

## 2019-03-08 LAB — ALLERGEN,OAT,F7: Allergen Oat IgE: <0.1 kU/L

## 2019-03-08 NOTE — Progress Notes (Signed)
Can you please let this patient know that her blood testing to foods was all negative. She should still avoid the foods that cause abdominal pain as this may be a food sensitivity. Please have her keep a detailed journal of those foods and symptoms. Thank you

## 2019-03-11 DIAGNOSIS — J019 Acute sinusitis, unspecified: Secondary | ICD-10-CM | POA: Diagnosis not present

## 2019-03-17 ENCOUNTER — Telehealth: Payer: Self-pay | Admitting: Adult Health

## 2019-03-17 NOTE — Telephone Encounter (Signed)

## 2019-03-20 ENCOUNTER — Other Ambulatory Visit: Payer: Self-pay | Admitting: Nurse Practitioner

## 2019-03-20 ENCOUNTER — Ambulatory Visit: Payer: BC Managed Care – PPO | Admitting: Adult Health

## 2019-03-20 DIAGNOSIS — K5904 Chronic idiopathic constipation: Secondary | ICD-10-CM

## 2019-03-20 DIAGNOSIS — R1084 Generalized abdominal pain: Secondary | ICD-10-CM

## 2019-03-22 ENCOUNTER — Telehealth: Payer: Self-pay | Admitting: Allergy & Immunology

## 2019-03-22 ENCOUNTER — Ambulatory Visit: Payer: BC Managed Care – PPO | Admitting: Nurse Practitioner

## 2019-03-22 DIAGNOSIS — G5601 Carpal tunnel syndrome, right upper limb: Secondary | ICD-10-CM | POA: Diagnosis not present

## 2019-03-22 DIAGNOSIS — G5602 Carpal tunnel syndrome, left upper limb: Secondary | ICD-10-CM | POA: Diagnosis not present

## 2019-03-22 NOTE — Telephone Encounter (Signed)
LMOM for patient to call back to discuss lab results.

## 2019-03-22 NOTE — Telephone Encounter (Signed)
Patient called requesting results of blood work. I took her name and number and told her that we would call her back. Reviewed note and it seems that she was already contacted with the results (voicemail left). We will call her back and give them to her again I suppose.  Result note from [place]: Notes recorded by Dara Hoyer, FNP on 03/08/2019 at 7:24 AM EDT  Can you please let this patient know that her blood testing to foods was all negative. She should still avoid the foods that cause abdominal pain as this may be a food sensitivity. Please have her keep a detailed journal of those foods and symptoms. Thank you   Salvatore Marvel, MD [facility] [place] [facility]

## 2019-03-22 NOTE — Telephone Encounter (Signed)
Pt returned call, lab results given. Pt verbalized understanding. Will keep a journal of foods that irritate her stomach.  Pt requested a copy of the labs drawn. Copy has been mailed to patient.

## 2019-03-24 DIAGNOSIS — H6983 Other specified disorders of Eustachian tube, bilateral: Secondary | ICD-10-CM | POA: Diagnosis not present

## 2019-03-24 DIAGNOSIS — M2669 Other specified disorders of temporomandibular joint: Secondary | ICD-10-CM | POA: Diagnosis not present

## 2019-03-24 DIAGNOSIS — K219 Gastro-esophageal reflux disease without esophagitis: Secondary | ICD-10-CM | POA: Diagnosis not present

## 2019-03-27 ENCOUNTER — Ambulatory Visit: Payer: BC Managed Care – PPO | Admitting: Family Medicine

## 2019-03-30 DIAGNOSIS — H9012 Conductive hearing loss, unilateral, left ear, with unrestricted hearing on the contralateral side: Secondary | ICD-10-CM | POA: Diagnosis not present

## 2019-03-30 DIAGNOSIS — H9313 Tinnitus, bilateral: Secondary | ICD-10-CM | POA: Diagnosis not present

## 2019-04-03 ENCOUNTER — Telehealth: Payer: Self-pay

## 2019-04-03 NOTE — Telephone Encounter (Signed)
It appears the patient was previously on Omeprazole. Going back to 2019 I don't see this in any visit. Can you call the patient, inform her of pharmacy recommendations, and ask her if she remembers being on Omeprazole (Prilosec) and ask if it worked or not.  Give her the option to change if she would like and let me know so I can send in any needed Rx changes.

## 2019-04-03 NOTE — Telephone Encounter (Signed)
Received fax from [facility], pt requests new rx: pt paying $30/fill for Pantoprazole 40mg . Lower cost alternative: Omeprazole 20mg  $4/fill.

## 2019-04-04 ENCOUNTER — Ambulatory Visit: Payer: BC Managed Care – PPO | Admitting: Adult Health

## 2019-04-04 NOTE — Telephone Encounter (Signed)
Tried to call pt, call went straight to VM, LMOVM for return call. 

## 2019-04-05 ENCOUNTER — Telehealth: Payer: Self-pay | Admitting: Adult Health

## 2019-04-05 NOTE — Telephone Encounter (Signed)
Tried to reach the patient to remind her of her appointment/restrictions, no answer or machine.

## 2019-04-05 NOTE — Telephone Encounter (Signed)
Tried to call pt, no answer, LMOVM for return call.  

## 2019-04-06 ENCOUNTER — Ambulatory Visit: Payer: BC Managed Care – PPO | Admitting: Adult Health

## 2019-04-06 ENCOUNTER — Other Ambulatory Visit: Payer: Self-pay

## 2019-04-06 ENCOUNTER — Encounter: Payer: Self-pay | Admitting: Adult Health

## 2019-04-06 VITALS — BP 89/56 | HR 63 | Ht 64.0 in | Wt 159.0 lb

## 2019-04-06 DIAGNOSIS — R232 Flushing: Secondary | ICD-10-CM

## 2019-04-06 DIAGNOSIS — Z780 Asymptomatic menopausal state: Secondary | ICD-10-CM | POA: Diagnosis not present

## 2019-04-06 DIAGNOSIS — G479 Sleep disorder, unspecified: Secondary | ICD-10-CM | POA: Diagnosis not present

## 2019-04-06 DIAGNOSIS — Z79818 Long term (current) use of other agents affecting estrogen receptors and estrogen levels: Secondary | ICD-10-CM | POA: Insufficient documentation

## 2019-04-06 DIAGNOSIS — F419 Anxiety disorder, unspecified: Secondary | ICD-10-CM

## 2019-04-06 DIAGNOSIS — Z79899 Other long term (current) drug therapy: Secondary | ICD-10-CM

## 2019-04-06 MED ORDER — BUSPIRONE HCL 7.5 MG PO TABS: 7.5000 mg | ORAL_TABLET | Freq: Three times a day (TID) | ORAL | 1 refills | Status: DC

## 2019-04-06 MED ORDER — ESTRADIOL 1 MG PO TABS: 1.0000 mg | ORAL_TABLET | Freq: Every day | ORAL | 3 refills | Status: DC

## 2019-04-06 NOTE — Progress Notes (Signed)
  Subjective:     [name], female   DOB: [birth year], 48 y.o.   MRN: [id number]  HPI Yvette Mayer is a 48 year old black female, sp hysterectomy, in with multiple complaints.Has hot flashes, anxiety, not sleeping well, mind races and vaginal dryness and dry mouth at times. PCP is Dr Buelah Manis.   Review of Systems +hot flashes +anxiey +not sleeping well +vaginal dryness and dry mouth at times +mind racing Reviewed past medical,surgical, social and family history. Reviewed medications and allergies.     Objective:   Physical Exam BP (!) 89/56 (BP Location: Left Arm, Patient Position: Sitting, Cuff Size: Normal)   Pulse 63   Ht 5\' 4"  (1.626 m)   Wt 159 lb (72.1 kg)   BMI 27.29 kg/m  Skin warm and dry.  Lungs: clear to ausculation bilaterally. Cardiovascular: regular rate and rhythm, per Weyman Croon FNP student.  Yvette Mayer brought labs [place] 64.2. Face time 15 minutes with 50% discussing menopause.   Assessment:     1. Menopause   2. Anxiety   3. Hot flashes   4. Sleep disturbance   5. Current use of estrogen therapy       Plan:     Will increase estrace to 1 mg Rx Buspar for anxiety Try taking trazodone more regularly Meds ordered this encounter  Medications  . estradiol (ESTRACE) 1 MG tablet    Sig: Take 1 tablet (1 mg total) by mouth daily.    Dispense:  30 tablet    Refill:  3    Order Specific Question:   Supervising Provider    Answer:   Elonda Husky, LUTHER H [[id number]]  . busPIRone (BUSPAR) 7.5 MG tablet    Sig: Take 1 tablet (7.5 mg total) by mouth 3 (three) times daily.    Dispense:  90 tablet    Refill:  1    Order Specific Question:   Supervising Provider    Answer:   Tania Ade H [[id number]]   Follow up in 4 weeks

## 2019-04-06 NOTE — Telephone Encounter (Signed)
Letter mailed

## 2019-04-19 DIAGNOSIS — H6983 Other specified disorders of Eustachian tube, bilateral: Secondary | ICD-10-CM | POA: Diagnosis not present

## 2019-04-19 DIAGNOSIS — J324 Chronic pansinusitis: Secondary | ICD-10-CM | POA: Diagnosis not present

## 2019-04-19 DIAGNOSIS — J329 Chronic sinusitis, unspecified: Secondary | ICD-10-CM | POA: Diagnosis not present

## 2019-04-25 DIAGNOSIS — H6983 Other specified disorders of Eustachian tube, bilateral: Secondary | ICD-10-CM | POA: Diagnosis not present

## 2019-04-26 ENCOUNTER — Other Ambulatory Visit: Payer: Self-pay

## 2019-04-27 ENCOUNTER — Other Ambulatory Visit: Payer: Self-pay | Admitting: Family Medicine

## 2019-05-02 ENCOUNTER — Ambulatory Visit: Payer: BC Managed Care – PPO | Admitting: Nurse Practitioner

## 2019-05-03 ENCOUNTER — Telehealth: Payer: Self-pay | Admitting: Adult Health

## 2019-05-03 NOTE — Telephone Encounter (Signed)
Tried to reach the patient to remind her of her appointment/restrictions, call can not be completed at this time. °

## 2019-05-04 ENCOUNTER — Ambulatory Visit: Payer: BC Managed Care – PPO | Admitting: Adult Health

## 2019-05-22 ENCOUNTER — Ambulatory Visit (INDEPENDENT_AMBULATORY_CARE_PROVIDER_SITE_OTHER): Payer: BC Managed Care – PPO | Admitting: Family Medicine

## 2019-05-22 ENCOUNTER — Other Ambulatory Visit: Payer: Self-pay

## 2019-05-22 DIAGNOSIS — J329 Chronic sinusitis, unspecified: Secondary | ICD-10-CM | POA: Diagnosis not present

## 2019-05-22 DIAGNOSIS — J310 Chronic rhinitis: Secondary | ICD-10-CM

## 2019-05-22 MED ORDER — AMOXICILLIN 875 MG PO TABS: 875.0000 mg | ORAL_TABLET | Freq: Two times a day (BID) | ORAL | 0 refills | Status: DC

## 2019-05-22 NOTE — Progress Notes (Signed)
Subjective:    [name], female    DOB: 1971-03-24, 48 y.o.   MRN: [id number]  HPI Patient is being seen today over the telephone.  Patient consents to be seen via telephone.  Phone call began at 347.  Phone call concluded at 4:00.  Patient states that she has been dealing with allergies all fall.  She gets a lot of sneezing and congestion due to dust.  She works in a tobacco plant.  She has been sneezing and coughing for several weeks.  However over the last week she has developed a headache.  She states that her whole head hurts.  She has pain and pressure in her frontal and maxillary sinuses.  She also has rhinorrhea and congestion and has been sneezing more.  She feels that she may have a sinus infection.  She denies any change in her sense of smell or taste although her nose is congested.  She denies any shortness of breath or chest pain.  She denies any nausea or vomiting or diarrhea. Past Medical History:  Diagnosis Date  . Abdominal pain 06/12/2014  . Allergy   . Anemia    iron 1 yr ago, normal hgb 7/12  . Anxiety   . Asthma    pt says no-bronchitis  . Bloated abdomen 10/15/2015  . Chronic back pain   . Chronic headaches   . Chronic neck pain   . Chronic sinusitis   . Current use of estrogen therapy 04/16/2015  . Dyspareunia 09/12/2014  . Elevated TSH 04/16/2015  . Fatigue 04/10/2015  . Fibromyalgia   . Gas 10/15/2015  . GERD (gastroesophageal reflux disease)   . Herpes simplex virus (HSV) infection   . History of kidney stones   . Hot flashes 04/10/2015  . Hyperlipidemia   . Kidney stone on right side 10/24/2015  . Mental disorder   . Ovarian cyst, right   . Paresthesias   . Shortness of breath   . Sinus drainage   . Sjoegren syndrome   . Symptoms, such as flushing, sleeplessness, headache, lack of concentration, associated with the menopause 04/10/2015  . TMJ (dislocation of temporomandibular joint)   . Vaginal discharge 08/08/2015  . Vertigo   . Yeast  infection 10/15/2015   Past Surgical History:  Procedure Laterality Date  . ABDOMINAL HYSTERECTOMY    . BIOPSY  09/09/2016   Procedure: BIOPSY;  Surgeon: Daneil Dolin, MD;  [facility];  Service: Endoscopy;;  esophageal  . BREAST CYST EXCISION Right 1980   benign  . CESAREAN SECTION     x2  . CHOLECYSTECTOMY  2005   cholelithiasis  . COLONOSCOPY N/A 09/09/2016   Procedure: COLONOSCOPY;  Surgeon: Daneil Dolin, MD;  [facility]: [facility];  Service: Endoscopy;  Laterality: N/A;  8:30am  . ESOPHAGOGASTRODUODENOSCOPY  01/23/2011   [id number] undulating Z-line vs short segment Barrett s/p bx (NOT BARRETT's)/small HH otherwise normal  . ESOPHAGOGASTRODUODENOSCOPY N/A 09/09/2016   Procedure: ESOPHAGOGASTRODUODENOSCOPY (EGD);  Surgeon: Daneil Dolin, MD;  [facility];  Service: Endoscopy;  Laterality: N/A;  Venia Minks DILATION  01/23/2011   Procedure: Venia Minks DILATION;  Surgeon: Daneil Dolin, MD;  [facility];  Service: Endoscopy;  Laterality: N/A;  . Venia Minks DILATION N/A 09/09/2016   Procedure: Venia Minks DILATION;  Surgeon: Daneil Dolin, MD;  [facility];  Service: Endoscopy;  Laterality: N/A;  . OVARIAN CYST REMOVAL  92 removal of cysts from behind ovaries  .  PARTIAL HYSTERECTOMY  93  . SEPTOPLASTY     2011  . TEMPOROMANDIBULAR JOINT SURGERY    . TONSILLECTOMY     2010  . TURBINATE RESECTION Bilateral 05/29/2013   Procedure: TURBINATE RESECTION;  Surgeon: Ascencion Dike, MD;  Location: [facility];  Service: ENT;  Laterality: Bilateral;  . UPPER GASTROINTESTINAL ENDOSCOPY     Current Outpatient Medications on File Prior to Visit  Medication Sig Dispense Refill  . AMITIZA 24 MCG capsule TAKE 1 CAPSULE (24 MCG TOTAL) BY MOUTH 2 (TWO) TIMES DAILY WITH A MEAL. (Patient not taking: Reported on 04/06/2019) 180 capsule 1  . budesonide (PULMICORT) 0.5 MG/2ML nebulizer solution Use as directed (Patient not taking: Reported on  04/06/2019) 60 mL 2  . busPIRone (BUSPAR) 7.5 MG tablet Take 1 tablet (7.5 mg total) by mouth 3 (three) times daily. 90 tablet 1  . escitalopram (LEXAPRO) 5 MG tablet TAKE 1 TABLET BY MOUTH EVERYDAY AT BEDTIME 90 tablet 1  . estradiol (ESTRACE) 1 MG tablet Take 1 tablet (1 mg total) by mouth daily. 30 tablet 3  . fexofenadine (ALLEGRA ALLERGY) 180 MG tablet Take 1 tablet (180 mg total) by mouth daily. 90 tablet 3  . NON FORMULARY as needed. Move Smoothe Tea    . pantoprazole (PROTONIX) 40 MG tablet Take 40 mg by mouth daily.    . traZODone (DESYREL) 50 MG tablet      No current facility-administered medications on file prior to visit.   Allergies  Allergen Reactions  . Hydrocodone-Acetaminophen Itching  . Percocet [Oxycodone-Acetaminophen] Itching  . Plaquenil [Hydroxychloroquine Sulfate] Rash   Social History   Socioeconomic History  . Marital status: Single    Spouse name: Not on file  . Number of children: 3  . Years of education: Not on file  . Highest education level: Not on file  Occupational History  . Occupation: Packer Transport planner: [employer]  Tobacco Use  . Smoking status: Never Smoker  . Smokeless tobacco: Never Used  Substance and Sexual Activity  . Alcohol use: No  . Drug use: No  . Sexual activity: Not Currently    Birth control/protection: Surgical    Comment: hyst  Other Topics Concern  . Not on file  Social History Narrative  . Not on file   Social Determinants of Health   Financial Resource Strain:   . Difficulty of Paying Living Expenses: Not on file  Food Insecurity:   . Worried About Charity fundraiser in the Last Year: Not on file  . Ran Out of Food in the Last Year: Not on file  Transportation Needs:   . Lack of Transportation (Medical): Not on file  . Lack of Transportation (Non-Medical): Not on file  Physical Activity:   . Days of Exercise per Week: Not on file  . Minutes of Exercise per Session: Not on file  Stress:   . Feeling  of Stress : Not on file  Social Connections:   . Frequency of Communication with Friends and Family: Not on file  . Frequency of Social Gatherings with Friends and Family: Not on file  . Attends Religious Services: Not on file  . Active Member of Clubs or Organizations: Not on file  . Attends Archivist Meetings: Not on file  . Marital Status: Not on file  Intimate Partner Violence:   . Fear of Current or Ex-Partner: Not on file  . Emotionally Abused: Not on file  .  Physically Abused: Not on file  . Sexually Abused: Not on file      Review of Systems  All other systems reviewed and are negative.      Objective:   Physical Exam        Assessment & Plan:  Rhinosinusitis  Symptoms sound consistent with a secondary sinus infection although I cannot rule out COVID-19.  Recommended amoxicillin 875 mg p.o. twice daily for 10 days.  Also recommended the patient be tested for COVID-19 to avoid unnecessary spread to close contacts.  I explained how the patient can schedule an appointment to be scheduled for COVID-19.

## 2019-05-28 ENCOUNTER — Other Ambulatory Visit: Payer: Self-pay | Admitting: Adult Health

## 2019-06-06 ENCOUNTER — Other Ambulatory Visit: Payer: Self-pay

## 2019-06-06 ENCOUNTER — Ambulatory Visit (INDEPENDENT_AMBULATORY_CARE_PROVIDER_SITE_OTHER): Payer: BC Managed Care – PPO | Admitting: Family Medicine

## 2019-06-06 ENCOUNTER — Encounter: Payer: Self-pay | Admitting: Family Medicine

## 2019-06-06 VITALS — BP 108/70 | HR 60 | Temp 98.8°F | Resp 16 | Wt 155.0 lb

## 2019-06-06 DIAGNOSIS — B009 Herpesviral infection, unspecified: Secondary | ICD-10-CM

## 2019-06-06 DIAGNOSIS — F419 Anxiety disorder, unspecified: Secondary | ICD-10-CM

## 2019-06-06 MED ORDER — ESCITALOPRAM OXALATE 5 MG PO TABS: ORAL_TABLET | ORAL | 1 refills | Status: DC

## 2019-06-06 MED ORDER — VALACYCLOVIR HCL 1 G PO TABS: 1000.0000 mg | ORAL_TABLET | Freq: Two times a day (BID) | ORAL | 0 refills | Status: DC

## 2019-06-06 NOTE — Patient Instructions (Addendum)
Take the anti-viral as prescribed Restart the lexapro F/U 6 weeks

## 2019-06-06 NOTE — Progress Notes (Signed)
   Subjective:    [name], female    DOB: [birth year], 49 y.o.   MRN: [id number]  Patient presents for Rash (irritation to buttocks) and Illness (nasal congestion/ drainage, cough, denies fever/ SOB- swollen lymph nodes in groin)   she had TMG surgery last  wED week- Dr. Gaylyn Rong     Dr. Janace Hoard- is planning to do ear tubes due to eustachean tube dysfunction and chronic fluid build up. She wants me to check her ears today   Chronic sinusitis- no fever, no cough, body aches   no acute illness, no vomiting/ diarrhea    She has known HSV type 1 and 2-   She has blisters on buttocks that came out Friday after her surgery, She used cortisone cream, and Saturday they had spread. Seen at [facility] on Sunday  she was gven clindamycin and tAC cream these didn't help  she stopped the clindamycin as it made her feel sick Note now recalls rash in same spot as in 2019    She she is following with [name] through her job to help with the anxiety and insomnia. Her ex husband continues to give her stress and grief.  She thinks that the rash broke out because she has been under stress with regards to him. She is not taking the Lexapro, the trazodone or the BuSpar which was actually given by her gynecologist.  She is willing to go back on the Lexapro.  She states that she is also can start some meditation therapy    Review Of Systems:  GEN- denies fatigue, fever, weight loss,weakness, recent illness HEENT- denies eye drainage, change in vision, nasal discharge, CVS- denies chest pain, palpitations RESP- denies SOB, cough, wheeze ABD- denies N/V, change in stools, abd pain GU- denies dysuria, hematuria, dribbling, incontinence MSK- denies joint pain, muscle aches, injury Neuro- denies headache, dizziness, syncope, seizure activity       Objective:    BP 108/70   Pulse 60   Temp 98.8 F (37.1 C)   Resp 16   Wt 155 lb (70.3 kg)   SpO2 99%   BMI 26.61 kg/m    98.8 GEN- NAD,  alert and oriented x3 HEENT- PERRL, EOMI, non injected sclera, pink conjunctiva, MMM, TM clear fluid bilat, no erythema  Neck- Supple, no thyromegaly, no LAD  CVS- RRR, no murmur RESP-CTAB Skin- left buttocks- cluster of small blisters with erythema at base, NT,  Nodes - mild enlarged inguinal nodes bilat Psych- normal affect and mood  EXT- No edema Pulses- Radial  2+        Assessment & Plan:      Problem List Items Addressed This Visit      Unprioritized   Anxiety    Restart lexapro, continue with psychotherapy F/U 6 weeks for meds      Relevant Medications   escitalopram (LEXAPRO) 5 MG tablet    Other Visit Diagnoses    HSV-2 (herpes simplex virus 2) infection    -  Primary   Treat with valtrex for 1 week, if she has more recurrence, can discuss suppresive therapy D/C Steroid and antibiotic   Relevant Medications   valACYclovir (VALTREX) 1000 MG tablet      Note: This dictation was prepared with Dragon dictation along with smaller phrase technology. Any transcriptional errors that result from this process are unintentional.

## 2019-06-06 NOTE — Assessment & Plan Note (Signed)
Restart lexapro, continue with psychotherapy F/U 6 weeks for meds

## 2019-06-12 ENCOUNTER — Telehealth: Payer: Self-pay | Admitting: Adult Health

## 2019-06-12 DIAGNOSIS — H6983 Other specified disorders of Eustachian tube, bilateral: Secondary | ICD-10-CM | POA: Diagnosis not present

## 2019-06-12 NOTE — Telephone Encounter (Signed)
Called patient regarding appointment and the following message was left: ° ° °We have you scheduled for an upcoming appointment at our office. At this time, we are still not allowing visitors during the appointment, however, a support person, over age 49, may accompany you to your appointment if assistance is needed for safety or care concerns. Otherwise, support persons should remain outside until the visit is complete.  ° °We ask if you are sick, have any symptoms of COVID, have had any exposure to anyone suspected or confirmed of having COVID-19, or are awaiting test results for COVID-19, to call our office as we may need to reschedule you for a virtual visit or schedule your appointment for a later date.   ° °Please know we will ask you these questions or similar questions when you arrive for your appointment and understand this is how we are keeping everyone safe.   ° °Also,to keep you safe, please use the provided hand sanitizer when you enter the office. We are asking everyone in the office to wear a mask to help prevent the spread of °germs. If you have a mask of your own, please wear it to your appointment, if not, we are happy to provide one for you. ° °Thank you for understanding and your cooperation.  ° ° °[facility] Staff ° ° ° ° ° °

## 2019-06-13 ENCOUNTER — Other Ambulatory Visit: Payer: Self-pay

## 2019-06-13 ENCOUNTER — Ambulatory Visit: Payer: BC Managed Care – PPO | Admitting: Adult Health

## 2019-06-13 ENCOUNTER — Encounter: Payer: Self-pay | Admitting: Adult Health

## 2019-06-13 VITALS — BP 112/76 | HR 51 | Ht 65.0 in | Wt 156.0 lb

## 2019-06-13 DIAGNOSIS — Z780 Asymptomatic menopausal state: Secondary | ICD-10-CM

## 2019-06-13 DIAGNOSIS — G479 Sleep disorder, unspecified: Secondary | ICD-10-CM | POA: Diagnosis not present

## 2019-06-13 DIAGNOSIS — Z79899 Other long term (current) drug therapy: Secondary | ICD-10-CM | POA: Diagnosis not present

## 2019-06-13 NOTE — Progress Notes (Signed)
  Subjective:     Patient ID: ROSSELIN GUERRIERO, female   DOB: Jun 08, 1970, 49 y.o.   MRN: [id number]  HPI Shelah is a 49 year old black female, [id number] PM in to discuss hormones.Hot flashes better, but not sleeping well. PCP is Dr Buelah Manis.  Review of Systems Hot flashes better Not sleeping well Has stress  Reviewed past medical,surgical, social and family history. Reviewed medications and allergies.     Objective:   Physical Exam BP 112/76 (BP Location: Left Arm, Patient Position: Sitting, Cuff Size: Normal)   Pulse (!) 51   Ht 5\' 5"  (1.651 m)   Wt 156 lb (70.8 kg)   BMI 25.96 kg/m  Skin warm and dry. Lungs: clear to ausculation bilaterally. Cardiovascular: regular rate and rhythm.   Discussed adding testosterone if desired. She had hormone super panel in June with Precious Gilding NP and wondered if needed labs now, I would not draw labs at this time.   Assessment:     1. Menopause Keep taking estrace  2. Sleep disturbance Take lexapro as per Dr Buelah Manis  3. Current use of estrogen therapy Continue estrace has refills     Plan:     Follow up in 3 months

## 2019-06-20 ENCOUNTER — Ambulatory Visit: Payer: BC Managed Care – PPO | Admitting: Nurse Practitioner

## 2019-06-27 ENCOUNTER — Encounter: Payer: Self-pay | Admitting: Internal Medicine

## 2019-07-11 ENCOUNTER — Ambulatory Visit: Payer: BC Managed Care – PPO | Admitting: Internal Medicine

## 2019-07-18 ENCOUNTER — Ambulatory Visit: Payer: BC Managed Care – PPO | Admitting: Family Medicine

## 2019-07-28 ENCOUNTER — Ambulatory Visit (INDEPENDENT_AMBULATORY_CARE_PROVIDER_SITE_OTHER): Payer: BC Managed Care – PPO | Admitting: Internal Medicine

## 2019-07-28 ENCOUNTER — Other Ambulatory Visit: Payer: Self-pay

## 2019-07-28 ENCOUNTER — Encounter: Payer: Self-pay | Admitting: Internal Medicine

## 2019-07-28 VITALS — BP 104/67 | HR 55 | Temp 97.0°F | Ht 65.0 in | Wt 155.6 lb

## 2019-07-28 DIAGNOSIS — K582 Mixed irritable bowel syndrome: Secondary | ICD-10-CM | POA: Diagnosis not present

## 2019-07-28 DIAGNOSIS — K30 Functional dyspepsia: Secondary | ICD-10-CM | POA: Diagnosis not present

## 2019-07-28 NOTE — Patient Instructions (Signed)
Continue Pantoprazole 40 mg daily  Begin Probiotic daily  Resume Benefiber 1 tablespoon daily - take every day without fail  Continue Yoga and walking  Office visit in 8 weeks

## 2019-07-28 NOTE — Progress Notes (Signed)
Primary Care Physician:  Alycia Rossetti, MD Primary Gastroenterologist:  Dr. Gala Romney  Pre-Procedure History & Physical: HPI:  Yvette Mayer is a 49 y.o. female here for chronic abdominal pain alternating bowel function with a tendency towards constipation.  Intermittent abdominal bloating.  History of GERD well-controlled on Protonix 40 mg daily. Patient returns today for follow-up.  Mentions her concern for ongoing possibility of "parasites".  Previously tried Linzess and Amitiza for constipation.  She cites multitude of possible food allergies.  She has had extensive serologic logic evaluation for allergies-all negative.  Celiac screen previously negative.  EGD previously demonstrated very mild gastritis no H. pylori.  Stool for O&P, etc. previously negative.  She does not have any odynophagia, dysphagia, early satiety.  Colonoscopy 2017 demonstrated pancolonic diverticulosis only.    No apparent intolerance to lactose-containing diet.  Allergist recommended food avoidance which causes her GI distress.  Patient states she is going to try probiotic to help with her abdominal distress which occurs from time to time.  She is not on a fiber supplement.  She states she has been doing yoga and walking more which she thinks helps.   Past Medical History:  Diagnosis Date  . Abdominal pain 06/12/2014  . Allergy   . Anemia    iron 1 yr ago, normal hgb 7/12  . Anxiety   . Asthma    pt says no-bronchitis  . Bloated abdomen 10/15/2015  . Chronic back pain   . Chronic headaches   . Chronic neck pain   . Chronic sinusitis   . Current use of estrogen therapy 04/16/2015  . Dyspareunia 09/12/2014  . Elevated TSH 04/16/2015  . Fatigue 04/10/2015  . Fibromyalgia   . Gas 10/15/2015  . GERD (gastroesophageal reflux disease)   . Herpes simplex virus (HSV) infection   . History of kidney stones   . Hot flashes 04/10/2015  . Hyperlipidemia   . Kidney stone on right side 10/24/2015  . Mental  disorder   . Ovarian cyst, right   . Paresthesias   . Shortness of breath   . Sinus drainage   . Sjoegren syndrome   . Symptoms, such as flushing, sleeplessness, headache, lack of concentration, associated with the menopause 04/10/2015  . TMJ (dislocation of temporomandibular joint)   . Vaginal discharge 08/08/2015  . Vertigo   . Yeast infection 10/15/2015    Past Surgical History:  Procedure Laterality Date  . ABDOMINAL HYSTERECTOMY    . BIOPSY  09/09/2016   Procedure: BIOPSY;  Surgeon: Daneil Dolin, MD;  [facility];  Service: Endoscopy;;  esophageal  . BREAST CYST EXCISION Right 1980   benign  . CESAREAN SECTION     x2  . CHOLECYSTECTOMY  2005   cholelithiasis  . COLONOSCOPY N/A 09/09/2016   Procedure: COLONOSCOPY;  Surgeon: Daneil Dolin, MD;  [facility];  Service: Endoscopy;  Laterality: N/A;  8:30am  . ESOPHAGOGASTRODUODENOSCOPY  01/23/2011   [id number] undulating Z-line vs short segment Barrett s/p bx (NOT BARRETT's)/small HH otherwise normal  . ESOPHAGOGASTRODUODENOSCOPY N/A 09/09/2016   Procedure: ESOPHAGOGASTRODUODENOSCOPY (EGD);  Surgeon: Daneil Dolin, MD;  [facility];  Service: Endoscopy;  Laterality: N/A;  Venia Minks DILATION  01/23/2011   Procedure: Venia Minks DILATION;  Surgeon: Daneil Dolin, MD;  [facility];  Service: Endoscopy;  Laterality: N/A;  . Venia Minks DILATION N/A 09/09/2016   Procedure: Venia Minks DILATION;  Surgeon: Daneil Dolin, MD;  [facility];  Service: Endoscopy;  Laterality: N/A;  . OVARIAN CYST REMOVAL  92 removal of cysts from behind ovaries  . PARTIAL HYSTERECTOMY  93  . SEPTOPLASTY     2011  . TEMPOROMANDIBULAR JOINT SURGERY    . TONSILLECTOMY     2010  . TURBINATE RESECTION Bilateral 05/29/2013   Procedure: TURBINATE RESECTION;  Surgeon: Ascencion Dike, MD;  Location: [place];  Service: ENT;  Laterality: Bilateral;  . UPPER GASTROINTESTINAL ENDOSCOPY      Prior to  Admission medications   Medication Sig Start Date End Date Taking? Authorizing Provider  docusate sodium (COLACE) 100 MG capsule Take 100 mg by mouth daily as needed for mild constipation.   Yes [provider]  estradiol (ESTRACE) 1 MG tablet TAKE 1 TABLET BY MOUTH EVERY DAY 05/30/19  Yes Derrek Monaco A, NP  pantoprazole (PROTONIX) 40 MG tablet Take 40 mg by mouth daily.   Yes [provider]    Allergies as of 07/28/2019 - Review Complete 07/28/2019  Allergen Reaction Noted  . Hydrocodone-acetaminophen Itching 01/13/2011  . Percocet [oxycodone-acetaminophen] Itching 01/13/2011  . Plaquenil [hydroxychloroquine sulfate] Rash 04/16/2015    Family History  Problem Relation Age of Onset  . Hypertension Mother   . Thyroid disease Sister        overactive  . Asthma Sister   . Eczema Sister   . Bronchitis Son   . Heart disease Maternal Grandmother   . Hypertension Maternal Grandmother   . Alcohol abuse Maternal Grandfather   . Scoliosis Son   . Other Maternal Aunt        Sjeogren syndrome  . Other Maternal Aunt        Sjoegren syndrome  . Other Maternal Aunt        Sjoegren syndrome  . Anesthesia problems Neg Hx   . Malignant hyperthermia Neg Hx   . Hypotension Neg Hx   . Pseudochol deficiency Neg Hx   . Colon cancer Neg Hx     Social History   Socioeconomic History  . Marital status: Single    Spouse name: Not on file  . Number of children: 3  . Years of education: Not on file  . Highest education level: Not on file  Occupational History  . Occupation: Packer Transport planner: [employer]  Tobacco Use  . Smoking status: Never Smoker  . Smokeless tobacco: Never Used  Substance and Sexual Activity  . Alcohol use: No  . Drug use: No  . Sexual activity: Not Currently    Birth control/protection: Surgical    Comment: hyst  Other Topics Concern  . Not on file  Social History Narrative  . Not on file   Social Determinants of Health    Financial Resource Strain:   . Difficulty of Paying Living Expenses: Not on file  Food Insecurity:   . Worried About Charity fundraiser in the Last Year: Not on file  . Ran Out of Food in the Last Year: Not on file  Transportation Needs:   . Lack of Transportation (Medical): Not on file  . Lack of Transportation (Non-Medical): Not on file  Physical Activity:   . Days of Exercise per Week: Not on file  . Minutes of Exercise per Session: Not on file  Stress:   . Feeling of Stress : Not on file  Social Connections:   . Frequency of Communication with Friends and Family: Not on file  . Frequency  of Social Gatherings with Friends and Family: Not on file  . Attends Religious Services: Not on file  . Active Member of Clubs or Organizations: Not on file  . Attends Archivist Meetings: Not on file  . Marital Status: Not on file  Intimate Partner Violence:   . Fear of Current or Ex-Partner: Not on file  . Emotionally Abused: Not on file  . Physically Abused: Not on file  . Sexually Abused: Not on file    Review of Systems: See HPI, otherwise negative ROS  Physical Exam: BP 104/67   Pulse (!) 55   Temp (!) 97 F (36.1 C) (Temporal)   Ht 5\' 5"  (1.651 m)   Wt 155 lb 9.6 oz (70.6 kg)   BMI 25.89 kg/m  General:   Alert,  Well-developed, well-nourished, pleasant and cooperative in NAD Neck:  Supple; no masses or thyromegaly. No significant cervical adenopathy. Lungs:  Clear throughout to auscultation.   No wheezes, crackles, or rhonchi. No acute distress. Heart:  Regular rate and rhythm; no murmurs, clicks, rubs,  or gallops. Abdomen: Non-distended, normal bowel sounds.  Soft and nontender without appreciable mass or hepatosplenomegaly.  Pulses:  Normal pulses noted. Extremities:  Without clubbing or edema.  Impression/Plan: Pleasant 49 year old lady with chronic abdominal pain/nonulcer dyspepsia.  She has had a fairly extensive work-up.  I do suspect a functional  component.  GERD well-controlled on Protonix.  She has alternating constipation and diarrhea suggesting a mixed pattern IBS.  Celiac screen negative.  No evidence of microscopic colitis seen on prior colon biopsy.  No H. pylori.  Starting a probiotic is not a bad choice therapeutic option.  She may also benefit from a low-dose fiber supplement She has no alarm symptoms whatsoever.  Recommendations:  Continue Pantoprazole 40 mg daily  Begin Probiotic daily as planned  Resume Benefiber 1 tablespoon daily - take every day without fail  Continue Yoga and walking  Office visit in 8 weeks    Notice: This dictation was prepared with Dragon dictation along with smaller phrase technology. Any transcriptional errors that result from this process are unintentional and may not be corrected upon review.

## 2019-08-01 ENCOUNTER — Ambulatory Visit (INDEPENDENT_AMBULATORY_CARE_PROVIDER_SITE_OTHER): Payer: BC Managed Care – PPO | Admitting: Otolaryngology

## 2019-08-09 ENCOUNTER — Ambulatory Visit: Payer: BC Managed Care – PPO | Admitting: Rheumatology

## 2019-08-16 ENCOUNTER — Emergency Department (HOSPITAL_COMMUNITY)
Admission: EM | Admit: 2019-08-16 | Discharge: 2019-08-16 | Disposition: A | Discharge status: Home / Self Care | Payer: BC Managed Care – PPO | Attending: Emergency Medicine | Admitting: Emergency Medicine

## 2019-08-16 ENCOUNTER — Emergency Department (HOSPITAL_COMMUNITY): Payer: BC Managed Care – PPO

## 2019-08-16 ENCOUNTER — Encounter (HOSPITAL_COMMUNITY): Payer: Self-pay | Admitting: *Deleted

## 2019-08-16 ENCOUNTER — Other Ambulatory Visit: Payer: Self-pay

## 2019-08-16 DIAGNOSIS — N390 Urinary tract infection, site not specified: Secondary | ICD-10-CM | POA: Diagnosis not present

## 2019-08-16 DIAGNOSIS — R109 Unspecified abdominal pain: Secondary | ICD-10-CM | POA: Diagnosis not present

## 2019-08-16 DIAGNOSIS — Z79899 Other long term (current) drug therapy: Secondary | ICD-10-CM | POA: Insufficient documentation

## 2019-08-16 DIAGNOSIS — J45909 Unspecified asthma, uncomplicated: Secondary | ICD-10-CM | POA: Diagnosis not present

## 2019-08-16 LAB — URINALYSIS, ROUTINE W REFLEX MICROSCOPIC
Bilirubin Urine: NEGATIVE
Glucose, UA: NEGATIVE mg/dL
Ketones, ur: NEGATIVE mg/dL
Nitrite: NEGATIVE
Protein, ur: NEGATIVE mg/dL
Specific Gravity, Urine: 1.011 (ref 1.005–1.030)
pH: 6 (ref 5.0–8.0)

## 2019-08-16 LAB — CBC WITH DIFFERENTIAL/PLATELET
Abs Immature Granulocytes: 0 10*3/uL (ref 0.00–0.07)
Basophils Absolute: 0.1 10*3/uL (ref 0.0–0.1)
Basophils Relative: 2 %
Eosinophils Absolute: 0.1 10*3/uL (ref 0.0–0.5)
Eosinophils Relative: 3 %
HCT: 41.5 % (ref 36.0–46.0)
Hemoglobin: 13.5 g/dL (ref 12.0–15.0)
Immature Granulocytes: 0 %
Lymphocytes Relative: 51 %
Lymphs Abs: 1.5 10*3/uL (ref 0.7–4.0)
MCH: 30.6 pg (ref 26.0–34.0)
MCHC: 32.5 g/dL (ref 30.0–36.0)
MCV: 94.1 fL (ref 80.0–100.0)
Monocytes Absolute: 0.3 10*3/uL (ref 0.1–1.0)
Monocytes Relative: 10 %
Neutro Abs: 1.1 10*3/uL — ABNORMAL LOW (ref 1.7–7.7)
Neutrophils Relative %: 34 %
Platelets: 318 10*3/uL (ref 150–400)
RBC: 4.41 MIL/uL (ref 3.87–5.11)
RDW: 12.5 % (ref 11.5–15.5)
WBC: 3.1 10*3/uL — ABNORMAL LOW (ref 4.0–10.5)
nRBC: 0 % (ref 0.0–0.2)

## 2019-08-16 LAB — COMPREHENSIVE METABOLIC PANEL
ALT: 16 U/L (ref 0–44)
AST: 20 U/L (ref 15–41)
Albumin: 4 g/dL (ref 3.5–5.0)
Alkaline Phosphatase: 63 U/L (ref 38–126)
Anion gap: 7 (ref 5–15)
BUN: 12 mg/dL (ref 6–20)
CO2: 27 mmol/L (ref 22–32)
Calcium: 9.2 mg/dL (ref 8.9–10.3)
Chloride: 105 mmol/L (ref 98–111)
Creatinine, Ser: 0.72 mg/dL (ref 0.44–1.00)
GFR calc Af Amer: >60 mL/min (ref >60)
GFR calc non Af Amer: >60 mL/min (ref >60)
Glucose, Bld: 93 mg/dL (ref 70–99)
Potassium: 3.8 mmol/L (ref 3.5–5.1)
Sodium: 139 mmol/L (ref 135–145)
Total Bilirubin: 0.6 mg/dL (ref 0.3–1.2)
Total Protein: 7.8 g/dL (ref 6.5–8.1)

## 2019-08-16 LAB — LIPASE, BLOOD: Lipase: 27 U/L (ref 11–51)

## 2019-08-16 MED ORDER — DICYCLOMINE HCL 20 MG PO TABS: 20.0000 mg | ORAL_TABLET | Freq: Two times a day (BID) | ORAL | 0 refills | Status: DC

## 2019-08-16 MED ORDER — IOHEXOL 300 MG/ML  SOLN
100.0000 mL | Freq: Once | INTRAMUSCULAR | Status: AC | PRN
  Administered 2019-08-16: 100 mL via INTRAVENOUS

## 2019-08-16 MED ORDER — CEPHALEXIN 500 MG PO CAPS: 500.0000 mg | ORAL_CAPSULE | Freq: Four times a day (QID) | ORAL | 0 refills | Status: DC

## 2019-08-16 NOTE — ED Triage Notes (Signed)
Pt c/o abdominal pain that radiates around to her back; pt states the pain this am woke her up; pt states her BM's have been lighter in color and more greasy

## 2019-08-16 NOTE — Discharge Instructions (Signed)
Test showed no life-threatening condition.  You may have a small urine infection.  Prescription for antibiotic and medicine for abdominal cramping.  Follow-up with your gastroenterologist.

## 2019-08-16 NOTE — ED Provider Notes (Addendum)
[facility] EMERGENCY DEPARTMENT Provider Note   CSN: [id number] Arrival date & time: 08/16/19  [id number]     History Chief Complaint  Patient presents with  . Abdominal Pain    Yvette Mayer is a 49 y.o. female.  Generalized abdominal pain for several months with a component of low back pain.  She has been seen by a gastroenterologist locally and prescribed probiotics which have not helped much.  She complains of intermittent constipation and lighter colored stools.  Actionable 10 pound weight loss recently.  No fever, chills, vomiting, diarrhea.  Status post partial hysterectomy 1994.  Still has her ovaries.  Nothing makes symptoms better or worse.  Not associated with any activity.        Past Medical History:  Diagnosis Date  . Abdominal pain 06/12/2014  . Allergy   . Anemia    iron 1 yr ago, normal hgb 7/12  . Anxiety   . Asthma    pt says no-bronchitis  . Bloated abdomen 10/15/2015  . Chronic back pain   . Chronic headaches   . Chronic neck pain   . Chronic sinusitis   . Current use of estrogen therapy 04/16/2015  . Dyspareunia 09/12/2014  . Elevated TSH 04/16/2015  . Fatigue 04/10/2015  . Fibromyalgia   . Gas 10/15/2015  . GERD (gastroesophageal reflux disease)   . Herpes simplex virus (HSV) infection   . History of kidney stones   . Hot flashes 04/10/2015  . Hyperlipidemia   . Kidney stone on right side 10/24/2015  . Mental disorder   . Ovarian cyst, right   . Paresthesias   . Shortness of breath   . Sinus drainage   . Sjoegren syndrome   . Symptoms, such as flushing, sleeplessness, headache, lack of concentration, associated with the menopause 04/10/2015  . TMJ (dislocation of temporomandibular joint)   . Vaginal discharge 08/08/2015  . Vertigo   . Yeast infection 10/15/2015    Patient Active Problem List   Diagnosis Date Noted  . Menopause 04/06/2019  . Sleep disturbance 04/06/2019  . Current use of estrogen therapy 04/06/2019  . Carpal tunnel syndrome  03/06/2019  . Sensation of fullness in both ears 03/01/2019  . Abdominal pain 03/02/2018  . History of UTI 11/29/2017  . Dyspareunia, female 11/29/2017  . Seasonal and perennial allergic rhinitis 10/12/2017  . Adverse food reaction 10/12/2017  . ANA positive Speckled 1:320 titer ENA negative  11/20/2016  . Leukopenia 11/11/2016  . History of bruising easily 09/08/2016  . Borderline high cholesterol 09/08/2016  . Situational mixed anxiety and depressive disorder 08/24/2016  . Rectocele 07/16/2016  . Kidney stone on right side 10/24/2015  . Hot flashes 04/10/2015  . Symptoms, such as flushing, sleeplessness, headache, lack of concentration, associated with the menopause 04/10/2015  . 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 BV (bacterial vaginosis) 03/21/2014  . Constipation 12/26/2013  . Overweight (BMI 25.0-29.9) 12/26/2013  . Sjoegren syndrome 08/09/2013  . Chronic insomnia 09/13/2012  . GERD (gastroesophageal reflux disease) 09/18/2011  . Anxiety 09/03/2011  . Hyperlipidemia 09/02/2011  . Fibromyalgia 09/02/2011  . Chronic nasal congestion 09/02/2011  . Vitamin D deficiency 09/02/2011  . Dysphagia 01/13/2011    Past Surgical History:  Procedure Laterality Date  . ABDOMINAL HYSTERECTOMY    . BIOPSY  09/09/2016   Procedure: BIOPSY;  Surgeon: Daneil Dolin, MD;  [facility];  Service: Endoscopy;;  esophageal  . BREAST CYST EXCISION Right 1980   benign  . CESAREAN SECTION  x2  . CHOLECYSTECTOMY  2005   cholelithiasis  . COLONOSCOPY N/A 09/09/2016   Procedure: COLONOSCOPY;  Surgeon: Daneil Dolin, MD;  [facility];  Service: Endoscopy;  Laterality: N/A;  8:30am  . ESOPHAGOGASTRODUODENOSCOPY  01/23/2011   [id number] undulating Z-line vs short segment Barrett s/p bx (NOT BARRETT's)/small HH otherwise normal  . ESOPHAGOGASTRODUODENOSCOPY N/A 09/09/2016   Procedure: ESOPHAGOGASTRODUODENOSCOPY (EGD);  Surgeon: Daneil Dolin, MD;  [facility];   Service: Endoscopy;  Laterality: N/A;  Venia Minks DILATION  01/23/2011   Procedure: Venia Minks DILATION;  Surgeon: Daneil Dolin, MD;  [facility];  Service: Endoscopy;  Laterality: N/A;  . Venia Minks DILATION N/A 09/09/2016   Procedure: Venia Minks DILATION;  Surgeon: Daneil Dolin, MD;  [facility];  Service: Endoscopy;  Laterality: N/A;  . OVARIAN CYST REMOVAL  92 removal of cysts from behind ovaries  . PARTIAL HYSTERECTOMY  93  . SEPTOPLASTY     2011  . TEMPOROMANDIBULAR JOINT SURGERY    . TONSILLECTOMY     2010  . TURBINATE RESECTION Bilateral 05/29/2013   Procedure: TURBINATE RESECTION;  Surgeon: Ascencion Dike, MD;  Location: [place];  Service: ENT;  Laterality: Bilateral;  . UPPER GASTROINTESTINAL ENDOSCOPY       OB History    Gravida  4   Para  3   Term  2   Preterm  1   AB  1   Living  3     SAB  1   TAB      Ectopic      Multiple      Live Births  3           Family History  Problem Relation Age of Onset  . Hypertension Mother   . Thyroid disease Sister        overactive  . Asthma Sister   . Eczema Sister   . Bronchitis Son   . Heart disease Maternal Grandmother   . Hypertension Maternal Grandmother   . Alcohol abuse Maternal Grandfather   . Scoliosis Son   . Other Maternal Aunt        Sjeogren syndrome  . Other Maternal Aunt        Sjoegren syndrome  . Other Maternal Aunt        Sjoegren syndrome  . Anesthesia problems Neg Hx   . Malignant hyperthermia Neg Hx   . Hypotension Neg Hx   . Pseudochol deficiency Neg Hx   . Colon cancer Neg Hx     Social History   Tobacco Use  . Smoking status: Never Smoker  . Smokeless tobacco: Never Used  Substance Use Topics  . Alcohol use: No  . Drug use: No    Home Medications Prior to Admission medications   Medication Sig Start Date End Date Taking? Authorizing Provider  estradiol (ESTRACE) 1 MG tablet TAKE 1 TABLET BY MOUTH EVERY DAY 05/30/19  Yes Derrek Monaco A, NP  pantoprazole (PROTONIX) 40 MG tablet Take 40 mg by mouth daily.   Yes [provider]  Probiotic Product (PROBIOTIC PO) Take 1 capsule by mouth daily.   Yes [provider]  Wheat Dextrin (BENEFIBER ON THE GO) PACK Take 1 Package by mouth daily.   Yes [provider]  cephALEXin (KEFLEX) 500 MG capsule Take 1 capsule (500 mg total) by mouth 4 (four) times daily. 08/16/19   Nat Christen, MD  dicyclomine (BENTYL) 20 MG  tablet Take 1 tablet (20 mg total) by mouth 2 (two) times daily. 08/16/19   Nat Christen, MD  docusate sodium (COLACE) 100 MG capsule Take 100 mg by mouth daily as needed for mild constipation.    [provider]    Allergies    Hydrocodone-acetaminophen, Percocet [oxycodone-acetaminophen], and Plaquenil [hydroxychloroquine sulfate]  Review of Systems   Review of Systems  All other systems reviewed and are negative.   Physical Exam Updated Vital Signs BP 112/67   Pulse (!) 58   Temp (!) 97.5 F (36.4 C) (Oral)   Resp 16   Ht 5\' 5"  (1.651 m)   Wt 67.1 kg   SpO2 100%   BMI 24.63 kg/m   Physical Exam Vitals and nursing note reviewed.  Constitutional:      Appearance: She is well-developed.  HENT:     Head: Normocephalic and atraumatic.  Eyes:     Conjunctiva/sclera: Conjunctivae normal.  Cardiovascular:     Rate and Rhythm: Normal rate and regular rhythm.  Pulmonary:     Effort: Pulmonary effort is normal.     Breath sounds: Normal breath sounds.  Abdominal:     General: Bowel sounds are normal.     Palpations: Abdomen is soft.     Comments: Minimal generalized tenderness.  Musculoskeletal:        General: Normal range of motion.     Cervical back: Neck supple.  Skin:    General: Skin is warm and dry.  Neurological:     General: No focal deficit present.     Mental Status: She is alert and oriented to person, place, and time.  Psychiatric:        Behavior: Behavior normal.     ED Results / Procedures  / Treatments   Labs (all labs ordered are listed, but only abnormal results are displayed) Labs Reviewed  CBC WITH DIFFERENTIAL/PLATELET - Abnormal; Notable for the following components:      Result Value   WBC 3.1 (*)    Neutro Abs 1.1 (*)    All other components within normal limits  URINALYSIS, ROUTINE W REFLEX MICROSCOPIC - Abnormal; Notable for the following components:   Hgb urine dipstick SMALL (*)    Leukocytes,Ua LARGE (*)    Bacteria, UA RARE (*)    All other components within normal limits  URINE CULTURE  COMPREHENSIVE METABOLIC PANEL  LIPASE, BLOOD    EKG None  Radiology CT Abdomen Pelvis W Contrast  Result Date: 08/16/2019 CLINICAL DATA:  Pt c/o abdominal pain that radiates around to her back; pt states the pain this am woke her up; pt states her BM's have been lighter in color and more greasy. EXAM: CT ABDOMEN AND PELVIS WITH CONTRAST TECHNIQUE: Multidetector CT imaging of the abdomen and pelvis was performed using the standard protocol following bolus administration of intravenous contrast. CONTRAST:  154mL OMNIPAQUE IOHEXOL 300 MG/ML  SOLN COMPARISON:  CT, 01/10/2018. FINDINGS: Lower chest: Bases essentially clear.  Heart normal in size. Hepatobiliary: No focal liver abnormality is seen. Status post cholecystectomy. No biliary dilatation. Pancreas: Unremarkable. No pancreatic ductal dilatation or surrounding inflammatory changes. Spleen: Normal in size without focal abnormality. Adrenals/Urinary Tract: No adrenal masses. Kidneys normal in size, orientation and position with symmetric enhancement excretion. 5 mm low-attenuation mass, posterior midpole the right kidney, too small to completely characterize but likely a cyst. No other renal masses, no stones and no hydronephrosis. Normal ureters. Normal bladder. Stomach/Bowel: Stomach is within normal limits. Appendix appears normal.  No evidence of bowel wall thickening, distention, or inflammatory changes. Vascular/Lymphatic:  No significant vascular findings are present. No enlarged abdominal or pelvic lymph nodes. Reproductive: Status post hysterectomy. No adnexal masses. Other: Trace amount of pelvic free fluid, most likely physiologic. No abdominal wall hernia. Musculoskeletal: No acute or significant osseous findings. IMPRESSION: 1. No acute findings. No findings to account for patient's symptoms. No bowel obstruction or inflammation. 2. Status post cholecystectomy and hysterectomy. Otherwise unremarkable exam. Electronically Signed   By: Lajean Manes M.D.   On: 08/16/2019 10:00    Procedures Procedures (including critical care time)  Medications Ordered in ED Medications  iohexol (OMNIPAQUE) 300 MG/ML solution 100 mL (100 mLs Intravenous Contrast Given 08/16/19 [id number])    ED Course  I have reviewed the triage vital signs and the nursing notes.  Pertinent labs & imaging results that were available during my care of the patient were reviewed by me and considered in my medical decision making (see chart for details).    MDM Rules/Calculators/A&P                      No acute abdomen.  However, will obtain labs and CT abdomen pelvis secondary to prolonged symptoms  1300: Reexamination.  No acute abdomen.  Discussed test results.  Will follow up with gastroenterologist.  Discharge medications Keflex 500 mg for UTI and Bentyl 20 mg. Final Clinical Impression(s) / ED Diagnoses Final diagnoses:  Abdominal pain, unspecified abdominal location  Urinary tract infection without hematuria, site unspecified    Rx / DC Orders ED Discharge Orders         Ordered    cephALEXin (KEFLEX) 500 MG capsule  4 times daily     08/16/19 1305    dicyclomine (BENTYL) 20 MG tablet  2 times daily     08/16/19 1305           Nat Christen, MD 08/16/19 [id number]    Nat Christen, MD 08/16/19 [phone]

## 2019-08-18 ENCOUNTER — Ambulatory Visit: Payer: BC Managed Care – PPO | Admitting: Family Medicine

## 2019-08-18 LAB — URINE CULTURE: Culture: <10000 — AB

## 2019-08-22 ENCOUNTER — Encounter: Payer: Self-pay | Admitting: Internal Medicine

## 2019-08-22 ENCOUNTER — Other Ambulatory Visit: Payer: Self-pay

## 2019-08-22 ENCOUNTER — Ambulatory Visit: Payer: BC Managed Care – PPO | Admitting: Internal Medicine

## 2019-08-22 VITALS — BP 103/66 | HR 64 | Temp 96.9°F | Ht 64.0 in | Wt 154.6 lb

## 2019-08-22 DIAGNOSIS — K219 Gastro-esophageal reflux disease without esophagitis: Secondary | ICD-10-CM | POA: Diagnosis not present

## 2019-08-22 DIAGNOSIS — K5900 Constipation, unspecified: Secondary | ICD-10-CM | POA: Diagnosis not present

## 2019-08-22 DIAGNOSIS — R1310 Dysphagia, unspecified: Secondary | ICD-10-CM | POA: Diagnosis not present

## 2019-08-22 MED ORDER — LUBIPROSTONE 24 MCG PO CAPS: 24.0000 ug | ORAL_CAPSULE | Freq: Two times a day (BID) | ORAL | 11 refills | Status: DC

## 2019-08-22 NOTE — Progress Notes (Signed)
Primary Care Physician:  Alycia Rossetti, MD Primary Gastroenterologist:  Dr.   Pre-Procedure History & Physical: HPI:  Yvette Mayer is a 49 y.o. female here for follow-up of GERD and altered bowel function.  Says she has a buildup of pressure from time to time in her upper abdomen  - evacuates infrequently.  States she does not remember ever taking Amitiza.  Says she never tried it.  Had lab abnormalities with Linzess.  Takes probiotic and Benefiber currently.  May go 2 to 3 days without a bowel movement.  No bleeding.  No dysphagia /odynophagia..  Reflux symptoms well controlled on Protonix 40 mg daily. She continues to be concerned about "parasites".  Not having any diarrhea.  Last EGD 2012.  Empiric dilation of her esophagus for dysphagia.  Mild reflux changes distal esophagus on pathology.  Past Medical History:  Diagnosis Date  . Abdominal pain 06/12/2014  . Allergy   . Anemia    iron 1 yr ago, normal hgb 7/12  . Anxiety   . Asthma    pt says no-bronchitis  . Bloated abdomen 10/15/2015  . Chronic back pain   . Chronic headaches   . Chronic neck pain   . Chronic sinusitis   . Current use of estrogen therapy 04/16/2015  . Dyspareunia 09/12/2014  . Elevated TSH 04/16/2015  . Fatigue 04/10/2015  . Fibromyalgia   . Gas 10/15/2015  . GERD (gastroesophageal reflux disease)   . Herpes simplex virus (HSV) infection   . History of kidney stones   . Hot flashes 04/10/2015  . Hyperlipidemia   . Kidney stone on right side 10/24/2015  . Mental disorder   . Ovarian cyst, right   . Paresthesias   . Shortness of breath   . Sinus drainage   . Sjoegren syndrome   . Symptoms, such as flushing, sleeplessness, headache, lack of concentration, associated with the menopause 04/10/2015  . TMJ (dislocation of temporomandibular joint)   . Vaginal discharge 08/08/2015  . Vertigo   . Yeast infection 10/15/2015    Past Surgical History:  Procedure Laterality Date  . ABDOMINAL HYSTERECTOMY      . BIOPSY  09/09/2016   Procedure: BIOPSY;  Surgeon: Daneil Dolin, MD;  [facility];  Service: Endoscopy;;  esophageal  . BREAST CYST EXCISION Right 1980   benign  . CESAREAN SECTION     x2  . CHOLECYSTECTOMY  2005   cholelithiasis  . COLONOSCOPY N/A 09/09/2016   Procedure: COLONOSCOPY;  Surgeon: Daneil Dolin, MD;  [facility];  Service: Endoscopy;  Laterality: N/A;  8:30am  . ESOPHAGOGASTRODUODENOSCOPY  01/23/2011   [id number] undulating Z-line vs short segment Barrett s/p bx (NOT BARRETT's)/small HH otherwise normal  . ESOPHAGOGASTRODUODENOSCOPY N/A 09/09/2016   Procedure: ESOPHAGOGASTRODUODENOSCOPY (EGD);  Surgeon: Daneil Dolin, MD;  [facility];  Service: Endoscopy;  Laterality: N/A;  Venia Minks DILATION  01/23/2011   Procedure: Venia Minks DILATION;  Surgeon: Daneil Dolin, MD;  [facility];  Service: Endoscopy;  Laterality: N/A;  . Venia Minks DILATION N/A 09/09/2016   Procedure: Venia Minks DILATION;  Surgeon: Daneil Dolin, MD;  [facility];  Service: Endoscopy;  Laterality: N/A;  . OVARIAN CYST REMOVAL  92 removal of cysts from behind ovaries  . PARTIAL HYSTERECTOMY  93  . SEPTOPLASTY     2011  . TEMPOROMANDIBULAR JOINT SURGERY    . TONSILLECTOMY     2010  . TURBINATE RESECTION  Bilateral 05/29/2013   Procedure: TURBINATE RESECTION;  Surgeon: Ascencion Dike, MD;  Location: [place];  Service: ENT;  Laterality: Bilateral;  . UPPER GASTROINTESTINAL ENDOSCOPY      Prior to Admission medications   Medication Sig Start Date End Date Taking? Authorizing Provider  cephALEXin (KEFLEX) 500 MG capsule Take 1 capsule (500 mg total) by mouth 4 (four) times daily. 08/16/19  Yes Nat Christen, MD  docusate sodium (COLACE) 100 MG capsule Take 100 mg by mouth daily as needed for mild constipation.   Yes [provider]  estradiol (ESTRACE) 1 MG tablet TAKE 1 TABLET BY MOUTH EVERY DAY 05/30/19  Yes Derrek Monaco A,  NP  pantoprazole (PROTONIX) 40 MG tablet Take 40 mg by mouth daily.   Yes [provider]  Probiotic Product (PROBIOTIC PO) Take 1 capsule by mouth daily.   Yes [provider]  Wheat Dextrin (BENEFIBER ON THE GO) PACK Take by mouth daily. Takes 1 tbsp daily   Yes [provider]    Allergies as of 08/22/2019 - Review Complete 08/22/2019  Allergen Reaction Noted  . Hydrocodone-acetaminophen Itching 01/13/2011  . Percocet [oxycodone-acetaminophen] Itching 01/13/2011  . Plaquenil [hydroxychloroquine sulfate] Rash 04/16/2015    Family History  Problem Relation Age of Onset  . Hypertension Mother   . Thyroid disease Sister        overactive  . Asthma Sister   . Eczema Sister   . Bronchitis Son   . Heart disease Maternal Grandmother   . Hypertension Maternal Grandmother   . Alcohol abuse Maternal Grandfather   . Scoliosis Son   . Other Maternal Aunt        Sjeogren syndrome  . Other Maternal Aunt        Sjoegren syndrome  . Other Maternal Aunt        Sjoegren syndrome  . Anesthesia problems Neg Hx   . Malignant hyperthermia Neg Hx   . Hypotension Neg Hx   . Pseudochol deficiency Neg Hx   . Colon cancer Neg Hx     Social History   Socioeconomic History  . Marital status: Single    Spouse name: Not on file  . Number of children: 3  . Years of education: Not on file  . Highest education level: Not on file  Occupational History  . Occupation: Packer Transport planner: [employer]  Tobacco Use  . Smoking status: Never Smoker  . Smokeless tobacco: Never Used  Substance and Sexual Activity  . Alcohol use: No  . Drug use: No  . Sexual activity: Not Currently    Birth control/protection: Surgical    Comment: hyst  Other Topics Concern  . Not on file  Social History Narrative  . Not on file   Social Determinants of Health   Financial Resource Strain:   . Difficulty of Paying Living Expenses:   Food Insecurity:   . Worried About Paediatric nurse in the Last Year:   . Arboriculturist in the Last Year:   Transportation Needs:   . Film/video editor (Medical):   Marland Kitchen Lack of Transportation (Non-Medical):   Physical Activity:   . Days of Exercise per Week:   . Minutes of Exercise per Session:   Stress:   . Feeling of Stress :   Social Connections:   . Frequency of Communication with Friends and Family:   . Frequency of Social Gatherings with Friends and Family:   .  Attends Religious Services:   . Active Member of Clubs or Organizations:   . Attends Archivist Meetings:   Marland Kitchen Marital Status:   Intimate Partner Violence:   . Fear of Current or Ex-Partner:   . Emotionally Abused:   Marland Kitchen Physically Abused:   . Sexually Abused:     Review of Systems: See HPI, otherwise negative ROS  Physical Exam: BP 103/66   Pulse 64   Temp (!) 96.9 F (36.1 C) (Temporal)   Ht 5\' 4"  (1.626 m)   Wt 154 lb 9.6 oz (70.1 kg)   BMI 26.54 kg/m  General:   Alert, , pleasant and cooperative in NAD Neck:  Supple; no masses or thyromegaly. No significant cervical adenopathy. Lungs:  Clear throughout to auscultation.   No wheezes, crackles, or rhonchi. No acute distress. Heart:  Regular rate and rhythm; no murmurs, clicks, rubs,  or gallops. Abdomen: Non-distended, normal bowel sounds.  Soft and nontender without appreciable mass or hepatosplenomegaly.  Pulses:  Normal pulses noted. Extremities:  Without clubbing or edema.  Impression/Plan: 49 year old lady with altered bowel function.  This lady certainly seems to be constipated.  Some bloating,  upper abdominal discomfort may be secondary to constipation.  She does have some symptoms of dyspepsia -no alarm features.  Continue to believe she may have a functional component.  Continue Protonix 40 mg daily  Begin Amitiza 24 twice daily ) take DURING A MEAL.  Disp 60 with 11 refills  Continue Probiotic and Benefiber  OV with [place] (EG) in 4- 6 weeks  As discussed, any residual  symptoms may warrant further evaluation.    Notice: This dictation was prepared with Dragon dictation along with smaller phrase technology. Any transcriptional errors that result from this process are unintentional and may not be corrected upon review.

## 2019-08-22 NOTE — Patient Instructions (Signed)
Continue Protonix 40 mg daily  Begin Amitiza 24 twice daily ) take DURING A MEAL.  Disp 60 with 11 refills  Continue Probiotic and Benefiber  OV with [place] (EG) in 4- 6 weeks  As discussed, any residual symptoms may warrant further evaluation.

## 2019-08-23 ENCOUNTER — Encounter: Payer: Self-pay | Admitting: Internal Medicine

## 2019-08-23 ENCOUNTER — Telehealth: Payer: Self-pay

## 2019-08-23 NOTE — Telephone Encounter (Signed)
Patient called to see if she can start allergy injections. Patient is having a hard time with her allergies.  Please Advise.

## 2019-08-24 ENCOUNTER — Other Ambulatory Visit: Payer: Self-pay | Admitting: *Deleted

## 2019-08-24 MED ORDER — EPINEPHRINE 0.3 MG/0.3ML IJ SOAJ: 0.3000 mg | Freq: Once | INTRAMUSCULAR | 1 refills | Status: AC

## 2019-08-24 NOTE — Telephone Encounter (Signed)
Called and left a voicemail for patient asking to return call to discuss and get her scheduled to start allergy injections after talking about the process.

## 2019-08-24 NOTE — Telephone Encounter (Signed)
Patient called back and went over the protocol with her regarding allergy injections. Patient verbalized understanding and has been scheduled to come in and start allergy injections in [place]. An EpiPen has been prescribed for the patient and has been advised to the patient to bring with her to her injections. Patient verbalized understanding.

## 2019-08-24 NOTE — Telephone Encounter (Signed)
She can certainly start allergy injections.  Can one of the nursing staff reach out to discuss the process with her?  Salvatore Marvel, MD [facility]

## 2019-08-24 NOTE — Telephone Encounter (Signed)
Patient called back to follow up on her message. Please Advise

## 2019-08-25 NOTE — Addendum Note (Signed)
Addended by: Valentina Shaggy on: 08/25/2019 06:57 AM   Modules accepted: Orders

## 2019-08-25 NOTE — Telephone Encounter (Signed)
Attempted to call patient to reschedule her appointment to start allergy injections on a Monday in [place] so that she can also have an office visit as well to establish care with [facility] Sil[facility]mith. No answer. Left message.

## 2019-08-28 NOTE — Progress Notes (Addendum)
VIALS EXP 08-28-20.  NEEDED ADDITIONAL LABEL.

## 2019-08-29 DIAGNOSIS — J301 Allergic rhinitis due to pollen: Secondary | ICD-10-CM | POA: Diagnosis not present

## 2019-08-30 DIAGNOSIS — J3089 Other allergic rhinitis: Secondary | ICD-10-CM | POA: Diagnosis not present

## 2019-09-04 ENCOUNTER — Telehealth: Payer: Self-pay | Admitting: Adult Health

## 2019-09-04 NOTE — Telephone Encounter (Signed)

## 2019-09-05 ENCOUNTER — Ambulatory Visit: Payer: BC Managed Care – PPO | Admitting: Adult Health

## 2019-09-11 ENCOUNTER — Ambulatory Visit: Payer: BC Managed Care – PPO | Admitting: Adult Health

## 2019-09-13 ENCOUNTER — Ambulatory Visit (INDEPENDENT_AMBULATORY_CARE_PROVIDER_SITE_OTHER): Payer: BC Managed Care – PPO

## 2019-09-13 ENCOUNTER — Other Ambulatory Visit: Payer: Self-pay

## 2019-09-13 DIAGNOSIS — J309 Allergic rhinitis, unspecified: Secondary | ICD-10-CM

## 2019-09-13 MED ORDER — EPINEPHRINE 0.3 MG/0.3ML IJ SOAJ: 0.3000 mg | Freq: Once | INTRAMUSCULAR | 1 refills | Status: AC

## 2019-09-13 NOTE — Progress Notes (Signed)
Immunotherapy   Patient Details  Name: Yvette Mayer MRN: [id number] Date of Birth: April 24, 1971  09/13/2019 Patient started allergy injections today. Patient received 0.05 from Blue vial G-[id number] and 0.05 from Blue vial [id number]. Patient will follow Schedule B and may receive injections 1-2 times weekly. Consent signed and patient instructions given. EpiPen sent to pharmacy. Patient aware. Patient waited 30 minutes prior to leaving. No adverse reactions noted.    Cathi Roan 09/13/2019, 3:08 PM

## 2019-09-15 ENCOUNTER — Telehealth: Payer: Self-pay | Admitting: Adult Health

## 2019-09-15 NOTE — Telephone Encounter (Signed)

## 2019-09-16 ENCOUNTER — Other Ambulatory Visit: Payer: Self-pay | Admitting: Internal Medicine

## 2019-09-18 ENCOUNTER — Encounter: Payer: Self-pay | Admitting: Adult Health

## 2019-09-18 ENCOUNTER — Other Ambulatory Visit: Payer: Self-pay

## 2019-09-18 ENCOUNTER — Ambulatory Visit (INDEPENDENT_AMBULATORY_CARE_PROVIDER_SITE_OTHER): Payer: BC Managed Care – PPO | Admitting: Adult Health

## 2019-09-18 VITALS — BP 95/66 | HR 57 | Ht 65.0 in | Wt 156.0 lb

## 2019-09-18 DIAGNOSIS — Z780 Asymptomatic menopausal state: Secondary | ICD-10-CM

## 2019-09-18 DIAGNOSIS — R309 Painful micturition, unspecified: Secondary | ICD-10-CM

## 2019-09-18 DIAGNOSIS — R232 Flushing: Secondary | ICD-10-CM | POA: Diagnosis not present

## 2019-09-18 DIAGNOSIS — F419 Anxiety disorder, unspecified: Secondary | ICD-10-CM

## 2019-09-18 LAB — POCT URINALYSIS DIPSTICK
Blood, UA: NEGATIVE
Glucose, UA: NEGATIVE
Leukocytes, UA: NEGATIVE
Nitrite, UA: NEGATIVE
Protein, UA: NEGATIVE

## 2019-09-18 MED ORDER — ESTRADIOL 2 MG PO TABS: 2.0000 mg | ORAL_TABLET | Freq: Every day | ORAL | 6 refills | Status: DC

## 2019-09-18 MED ORDER — BUSPIRONE HCL 5 MG PO TABS: 5.0000 mg | ORAL_TABLET | Freq: Three times a day (TID) | ORAL | 1 refills | Status: DC

## 2019-09-18 NOTE — Progress Notes (Signed)
  Subjective:     [name], female   DOB: November 11, 1970, 49 y.o.   MRN: [id number]  HPI [name] is a 49 year old black female,sp hysterectomy in to discuss menopause.  PCP is Dr Buelah Manis.   Review of Systems Has hot flashes still and some night sweats Feels dry all over +constipation at times,gassy, stomach hurts Had normal CT 08/16/19 Pain with peeing in am +anxiety Reviewed past medical,surgical, social and family history. Reviewed medications and allergies.     Objective:   Physical Exam BP 95/66 (BP Location: Left Arm, Patient Position: Sitting, Cuff Size: Normal)   Pulse (!) 57   Ht 5\' 5"  (1.651 m)   Wt 156 lb (70.8 kg)   BMI 25.96 kg/m urine negative.   Skin warm and dry.  Lungs: clear to ausculation bilaterally. Cardiovascular: regular rate and rhythm.    Assessment:     1. Pain with urination Urine was negative   2. Hot flashes Increase estrace to 2 mg daily  3. Anxiety Will try buspar Meds ordered this encounter  Medications  . estradiol (ESTRACE) 2 MG tablet    Sig: Take 1 tablet (2 mg total) by mouth daily.    Dispense:  30 tablet    Refill:  6    Order Specific Question:   Supervising Provider    Answer:   Elonda Husky, LUTHER H [[id number]]  . busPIRone (BUSPAR) 5 MG tablet    Sig: Take 1 tablet (5 mg total) by mouth 3 (three) times daily.    Dispense:  90 tablet    Refill:  1    Order Specific Question:   Supervising Provider    Answer:   Elonda Husky, LUTHER H [[id number]]  4. Menopause     Plan:     Follow up in 4 weeks

## 2019-09-20 NOTE — Progress Notes (Deleted)
Office Visit Note  Patient: Yvette Mayer             Date of Birth: 1970-07-20           MRN: [id number]             PCP: Alycia Rossetti, MD Referring: Alycia Rossetti, MD Visit Date: 09/26/2019 Occupation: @GUAROCC @  Subjective:  No chief complaint on file.   History of Present Illness: Yvette Mayer is a 49 y.o. female ***   Activities of Daily Living:  Patient reports morning stiffness for *** {minute/hour:19697}.   Patient {ACTIONS;DENIES/REPORTS:21021675::"Denies"} nocturnal pain.  Difficulty dressing/grooming: {ACTIONS;DENIES/REPORTS:21021675::"Denies"} Difficulty climbing stairs: {ACTIONS;DENIES/REPORTS:21021675::"Denies"} Difficulty getting out of chair: {ACTIONS;DENIES/REPORTS:21021675::"Denies"} Difficulty using hands for taps, buttons, cutlery, and/or writing: {ACTIONS;DENIES/REPORTS:21021675::"Denies"}  No Rheumatology ROS completed.   PMFS History:  Patient Active Problem List   Diagnosis Date Noted  . Pain with urination 09/18/2019  . Menopause 04/06/2019  . Sleep disturbance 04/06/2019  . Current use of estrogen therapy 04/06/2019  . Carpal tunnel syndrome 03/06/2019  . Sensation of fullness in both ears 03/01/2019  . Abdominal pain 03/02/2018  . History of UTI 11/29/2017  . Dyspareunia, female 11/29/2017  . Seasonal and perennial allergic rhinitis 10/12/2017  . Adverse food reaction 10/12/2017  . ANA positive Speckled 1:320 titer ENA negative  11/20/2016  . Leukopenia 11/11/2016  . History of bruising easily 09/08/2016  . Borderline high cholesterol 09/08/2016  . Situational mixed anxiety and depressive disorder 08/24/2016  . Rectocele 07/16/2016  . Kidney stone on right side 10/24/2015  . Hot flashes 04/10/2015  . Symptoms, such as flushing, sleeplessness, headache, lack of concentration, associated with the menopause 04/10/2015  . 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 BV (bacterial vaginosis) 03/21/2014  . Constipation 12/26/2013  . Overweight  (BMI 25.0-29.9) 12/26/2013  . Sjoegren syndrome 08/09/2013  . Chronic insomnia 09/13/2012  . GERD (gastroesophageal reflux disease) 09/18/2011  . Anxiety 09/03/2011  . Hyperlipidemia 09/02/2011  . Fibromyalgia 09/02/2011  . Chronic nasal congestion 09/02/2011  . Vitamin D deficiency 09/02/2011  . Dysphagia 01/13/2011    Past Medical History:  Diagnosis Date  . Abdominal pain 06/12/2014  . Allergy   . Anemia    iron 1 yr ago, normal hgb 7/12  . Anxiety   . Asthma    pt says no-bronchitis  . Bloated abdomen 10/15/2015  . Chronic back pain   . Chronic headaches   . Chronic neck pain   . Chronic sinusitis   . Current use of estrogen therapy 04/16/2015  . Dyspareunia 09/12/2014  . Elevated TSH 04/16/2015  . Fatigue 04/10/2015  . Fibromyalgia   . Gas 10/15/2015  . GERD (gastroesophageal reflux disease)   . Herpes simplex virus (HSV) infection   . History of kidney stones   . Hot flashes 04/10/2015  . Hyperlipidemia   . Kidney stone on right side 10/24/2015  . Mental disorder   . Ovarian cyst, right   . Paresthesias   . Shortness of breath   . Sinus drainage   . Sjoegren syndrome   . Symptoms, such as flushing, sleeplessness, headache, lack of concentration, associated with the menopause 04/10/2015  . TMJ (dislocation of temporomandibular joint)   . Vaginal discharge 08/08/2015  . Vertigo   . Yeast infection 10/15/2015    Family History  Problem Relation Age of Onset  . Hypertension Mother   . Thyroid disease Sister        overactive  . Asthma Sister   . Eczema  Sister   . Bronchitis Son   . Heart disease Maternal Grandmother   . Hypertension Maternal Grandmother   . Alcohol abuse Maternal Grandfather   . Scoliosis Son   . Other Maternal Aunt        Sjeogren syndrome  . Other Maternal Aunt        Sjoegren syndrome  . Other Maternal Aunt        Sjoegren syndrome  . Anesthesia problems Neg Hx   . Malignant hyperthermia Neg Hx   . Hypotension Neg Hx   . Pseudochol  deficiency Neg Hx   . Colon cancer Neg Hx    Past Surgical History:  Procedure Laterality Date  . ABDOMINAL HYSTERECTOMY    . BIOPSY  09/09/2016   Procedure: BIOPSY;  Surgeon: Daneil Dolin, MD;  [facility];  Service: Endoscopy;;  esophageal  . BREAST CYST EXCISION Right 1980   benign  . CESAREAN SECTION     x2  . CHOLECYSTECTOMY  2005   cholelithiasis  . COLONOSCOPY N/A 09/09/2016   Procedure: COLONOSCOPY;  Surgeon: Daneil Dolin, MD;  [facility];  Service: Endoscopy;  Laterality: N/A;  8:30am  . ESOPHAGOGASTRODUODENOSCOPY  01/23/2011   [id number] undulating Z-line vs short segment Barrett s/p bx (NOT BARRETT's)/small HH otherwise normal  . ESOPHAGOGASTRODUODENOSCOPY N/A 09/09/2016   Procedure: ESOPHAGOGASTRODUODENOSCOPY (EGD);  Surgeon: Daneil Dolin, MD;  [facility];  Service: Endoscopy;  Laterality: N/A;  Venia Minks DILATION  01/23/2011   Procedure: Venia Minks DILATION;  Surgeon: Daneil Dolin, MD;  [facility];  Service: Endoscopy;  Laterality: N/A;  . Venia Minks DILATION N/A 09/09/2016   Procedure: Venia Minks DILATION;  Surgeon: Daneil Dolin, MD;  [facility];  Service: Endoscopy;  Laterality: N/A;  . OVARIAN CYST REMOVAL  92 removal of cysts from behind ovaries  . PARTIAL HYSTERECTOMY  93  . SEPTOPLASTY     2011  . TEMPOROMANDIBULAR JOINT SURGERY    . TONSILLECTOMY     2010  . TURBINATE RESECTION Bilateral 05/29/2013   Procedure: TURBINATE RESECTION;  Surgeon: Ascencion Dike, MD;  Location: [facility];  Service: ENT;  Laterality: Bilateral;  . UPPER GASTROINTESTINAL ENDOSCOPY     Social History   Social History Narrative  . Not on file   Immunization History  Administered Date(s) Administered  . Tdap 09/18/2011     Objective: Vital Signs: There were no vitals taken for this visit.   Physical Exam   Musculoskeletal Exam: ***  CDAI Exam: CDAI Score: -- Patient Global: --; Provider Global:  -- Swollen: --; Tender: -- Joint Exam 09/26/2019   No joint exam has been documented for this visit   There is currently no information documented on the homunculus. Go to the Rheumatology activity and complete the homunculus joint exam.  Investigation: No additional findings.  Imaging: No results found.  Recent Labs: Lab Results  Component Value Date   WBC 3.1 (L) 08/16/2019   HGB 13.5 08/16/2019   PLT 318 08/16/2019   NA 139 08/16/2019   K 3.8 08/16/2019   CL 105 08/16/2019   CO2 27 08/16/2019   GLUCOSE 93 08/16/2019   BUN 12 08/16/2019   CREATININE 0.72 08/16/2019   BILITOT 0.6 08/16/2019   ALKPHOS 63 08/16/2019   AST 20 08/16/2019   ALT 16 08/16/2019   PROT 7.8 08/16/2019   ALBUMIN 4.0 08/16/2019   CALCIUM 9.2 08/16/2019   GFRAA >60 08/16/2019  Speciality Comments: No specialty comments available.  Procedures:  No procedures performed Allergies: Hydrocodone-acetaminophen, Percocet [oxycodone-acetaminophen], and Plaquenil [hydroxychloroquine sulfate]   Assessment / Plan:     Visit Diagnoses: No diagnosis found.  Orders: No orders of the defined types were placed in this encounter.  No orders of the defined types were placed in this encounter.   Face-to-face time spent with patient was *** minutes. Greater than 50% of time was spent in counseling and coordination of care.  Follow-Up Instructions: No follow-ups on file.   Ofilia Neas, PA-C  Note - This record has been created using Dragon software.  Chart creation errors have been sought, but may not always  have been located. Such creation errors do not reflect on  the standard of medical care.

## 2019-09-21 ENCOUNTER — Telehealth: Payer: Self-pay

## 2019-09-21 NOTE — Telephone Encounter (Signed)
Received fax from [facility], Amitiza 58mcg not covered. Use Linzess, Movantik, Symproicnon-formulary drug.

## 2019-09-22 ENCOUNTER — Ambulatory Visit (INDEPENDENT_AMBULATORY_CARE_PROVIDER_SITE_OTHER): Payer: BC Managed Care – PPO

## 2019-09-22 DIAGNOSIS — J309 Allergic rhinitis, unspecified: Secondary | ICD-10-CM

## 2019-09-22 NOTE — Telephone Encounter (Signed)
Will need to appeal. She has tried/failed all 3 doses of Linzess. Movantik and Symproic are only FDA approved for opioid induced constipation (OIC) and she is not on pain medication.

## 2019-09-25 NOTE — Telephone Encounter (Signed)
Called [phone] (pharmacy help desk). They are send a PA form for Amitiza to the office to complete in the next 24-48 hrs and fax back. They don't have a denial on file. Will look out for form and submit.

## 2019-09-25 NOTE — Telephone Encounter (Signed)
Routing to AM.

## 2019-09-26 ENCOUNTER — Ambulatory Visit: Payer: BC Managed Care – PPO | Admitting: Rheumatology

## 2019-09-26 ENCOUNTER — Ambulatory Visit: Payer: BC Managed Care – PPO | Admitting: Internal Medicine

## 2019-09-26 DIAGNOSIS — G5603 Carpal tunnel syndrome, bilateral upper limbs: Secondary | ICD-10-CM

## 2019-09-26 DIAGNOSIS — M26609 Unspecified temporomandibular joint disorder, unspecified side: Secondary | ICD-10-CM

## 2019-09-26 DIAGNOSIS — Z8639 Personal history of other endocrine, nutritional and metabolic disease: Secondary | ICD-10-CM

## 2019-09-26 DIAGNOSIS — N200 Calculus of kidney: Secondary | ICD-10-CM

## 2019-09-26 DIAGNOSIS — F5104 Psychophysiologic insomnia: Secondary | ICD-10-CM

## 2019-09-26 DIAGNOSIS — E559 Vitamin D deficiency, unspecified: Secondary | ICD-10-CM

## 2019-09-26 DIAGNOSIS — M797 Fibromyalgia: Secondary | ICD-10-CM

## 2019-09-26 DIAGNOSIS — Z8719 Personal history of other diseases of the digestive system: Secondary | ICD-10-CM

## 2019-09-26 DIAGNOSIS — R768 Other specified abnormal immunological findings in serum: Secondary | ICD-10-CM

## 2019-09-26 DIAGNOSIS — M3509 Sicca syndrome with other organ involvement: Secondary | ICD-10-CM

## 2019-09-26 DIAGNOSIS — Z79899 Other long term (current) drug therapy: Secondary | ICD-10-CM

## 2019-09-28 NOTE — Progress Notes (Signed)
Office Visit Note  Patient: Yvette Mayer             Date of Birth: 1970/11/27           MRN: [id number]             PCP: Alycia Rossetti, MD Referring: Alycia Rossetti, MD Visit Date: 10/03/2019 Occupation: @GUAROCC @  Subjective:  Follow-up (Very dry eyes and mouth, total body pain, wants ANA rechecked)   History of Present Illness: Yvette Mayer is a 49 y.o. female with history of sicca symptoms.  She returns today after her last visit in March 2018.  She states she continues to have dry mouth and dry eyes.  She states she had good response to pilocarpine in the past.  She has been also experiencing generalized body aches and pains.  She has been experiencing increased fatigue.  Patient states she had sleep study last year which was negative for sleep apnea.  She has been dealing with depression and anxiety.  She also experiences tingling in her hands at night.  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  There is no history of oral ulcers, nasal ulcers, Raynaud's phenomenon, rash, arthritis.  Activities of Daily Living:  Patient reports morning stiffness for 10 minutes.   Patient Reports nocturnal pain.  Difficulty dressing/grooming: Denies Difficulty climbing stairs: Denies Difficulty getting out of chair: Denies Difficulty using hands for taps, buttons, cutlery, and/or writing: Denies  Review of Systems  Constitutional: Positive for fatigue. Negative for night sweats, weight gain and weight loss.  HENT: Positive for mouth dryness. Negative for mouth sores, trouble swallowing, trouble swallowing and nose dryness.   Eyes: Positive for dryness. Negative for pain, redness and visual disturbance.  Respiratory: Negative for cough, shortness of breath and difficulty breathing.   Cardiovascular: Negative for chest pain, palpitations, hypertension, irregular heartbeat and swelling in legs/feet.  Gastrointestinal: Positive for constipation. Negative for blood in stool and diarrhea.     Bloating  Endocrine: Positive for excessive thirst. Negative for cold intolerance, heat intolerance and increased urination.  Genitourinary: Negative for difficulty urinating and vaginal dryness.  Musculoskeletal: Positive for arthralgias, gait problem, joint pain, morning stiffness and muscle tenderness. Negative for joint swelling, myalgias, muscle weakness and myalgias.  Skin: Negative for color change, rash, hair loss, skin tightness, ulcers and sensitivity to sunlight.  Allergic/Immunologic: Negative for susceptible to infections.  Neurological: Positive for numbness and weakness. Negative for dizziness, memory loss and night sweats.  Hematological: Negative for bruising/bleeding tendency and swollen glands.  Psychiatric/Behavioral: Positive for depressed mood and sleep disturbance. The patient is nervous/anxious.     PMFS History:  Patient Active Problem List   Diagnosis Date Noted  . Pain with urination 09/18/2019  . Menopause 04/06/2019  . Sleep disturbance 04/06/2019  . Current use of estrogen therapy 04/06/2019  . Carpal tunnel syndrome 03/06/2019  . Sensation of fullness in both ears 03/01/2019  . Abdominal pain 03/02/2018  . History of UTI 11/29/2017  . Dyspareunia, female 11/29/2017  . Seasonal and perennial allergic rhinitis 10/12/2017  . Adverse food reaction 10/12/2017  . ANA positive Speckled 1:320 titer ENA negative  11/20/2016  . Leukopenia 11/11/2016  . History of bruising easily 09/08/2016  . Borderline high cholesterol 09/08/2016  . Situational mixed anxiety and depressive disorder 08/24/2016  . Rectocele 07/16/2016  . Kidney stone on right side 10/24/2015  . Hot flashes 04/10/2015  . Symptoms, such as flushing, sleeplessness, headache, lack of concentration, associated with  the menopause 04/10/2015  . 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 BV (bacterial vaginosis) 03/21/2014  . Constipation 12/26/2013  . Overweight (BMI 25.0-29.9) 12/26/2013  . Sjoegren syndrome  08/09/2013  . Chronic insomnia 09/13/2012  . GERD (gastroesophageal reflux disease) 09/18/2011  . Anxiety 09/03/2011  . Hyperlipidemia 09/02/2011  . Fibromyalgia 09/02/2011  . Chronic nasal congestion 09/02/2011  . Vitamin D deficiency 09/02/2011  . Dysphagia 01/13/2011    Past Medical History:  Diagnosis Date  . Abdominal pain 06/12/2014  . Allergy   . Anemia    iron 1 yr ago, normal hgb 7/12  . Anxiety   . Asthma    pt says no-bronchitis  . Bloated abdomen 10/15/2015  . Chronic back pain   . Chronic headaches   . Chronic neck pain   . Chronic sinusitis   . Current use of estrogen therapy 04/16/2015  . Dyspareunia 09/12/2014  . Elevated TSH 04/16/2015  . Fatigue 04/10/2015  . Fibromyalgia   . Gas 10/15/2015  . GERD (gastroesophageal reflux disease)   . Herpes simplex virus (HSV) infection   . History of kidney stones   . Hot flashes 04/10/2015  . Hyperlipidemia   . Kidney stone on right side 10/24/2015  . Mental disorder   . Ovarian cyst, right   . Paresthesias   . Shortness of breath   . Sinus drainage   . Sjoegren syndrome   . Symptoms, such as flushing, sleeplessness, headache, lack of concentration, associated with the menopause 04/10/2015  . TMJ (dislocation of temporomandibular joint)   . Vaginal discharge 08/08/2015  . Vertigo   . Yeast infection 10/15/2015    Family History  Problem Relation Age of Onset  . Hypertension Mother   . Thyroid disease Sister        overactive  . Asthma Sister   . Eczema Sister   . Bronchitis Son   . Heart disease Maternal Grandmother   . Hypertension Maternal Grandmother   . Alcohol abuse Maternal Grandfather   . Scoliosis Son   . Other Maternal Aunt        Sjeogren syndrome  . Other Maternal Aunt        Sjoegren syndrome  . Other Maternal Aunt        Sjoegren syndrome  . Anesthesia problems Neg Hx   . Malignant hyperthermia Neg Hx   . Hypotension Neg Hx   . Pseudochol deficiency Neg Hx   . Colon cancer Neg Hx    Past  Surgical History:  Procedure Laterality Date  . ABDOMINAL HYSTERECTOMY    . BIOPSY  09/09/2016   Procedure: BIOPSY;  Surgeon: Daneil Dolin, MD;  [facility];  Service: Endoscopy;;  esophageal  . BREAST CYST EXCISION Right 1980   benign  . CESAREAN SECTION     x2  . CHOLECYSTECTOMY  2005   cholelithiasis  . COLONOSCOPY N/A 09/09/2016   Procedure: COLONOSCOPY;  Surgeon: Daneil Dolin, MD;  [facility];  Service: Endoscopy;  Laterality: N/A;  8:30am  . ESOPHAGOGASTRODUODENOSCOPY  01/23/2011   [id number] undulating Z-line vs short segment Barrett s/p bx (NOT BARRETT's)/small HH otherwise normal  . ESOPHAGOGASTRODUODENOSCOPY N/A 09/09/2016   Procedure: ESOPHAGOGASTRODUODENOSCOPY (EGD);  Surgeon: Daneil Dolin, MD;  [facility];  Service: Endoscopy;  Laterality: N/A;  Venia Minks DILATION  01/23/2011   Procedure: Venia Minks DILATION;  Surgeon: Daneil Dolin, MD;  [facility];  Service: Endoscopy;  Laterality: N/A;  . MALONEY DILATION N/A  09/09/2016   Procedure: Venia Minks DILATION;  Surgeon: Daneil Dolin, MD;  [facility];  Service: Endoscopy;  Laterality: N/A;  . OVARIAN CYST REMOVAL  92 removal of cysts from behind ovaries  . PARTIAL HYSTERECTOMY  93  . SEPTOPLASTY     2011  . TEMPOROMANDIBULAR JOINT SURGERY    . TONSILLECTOMY     2010  . TURBINATE RESECTION Bilateral 05/29/2013   Procedure: TURBINATE RESECTION;  Surgeon: Ascencion Dike, MD;  Location: [facility];  Service: ENT;  Laterality: Bilateral;  . UPPER GASTROINTESTINAL ENDOSCOPY     Social History   Social History Narrative  . Not on file   Immunization History  Administered Date(s) Administered  . Tdap 09/18/2011     Objective: Vital Signs: BP 97/60 (BP Location: Right Arm, Patient Position: Sitting, Cuff Size: Normal)   Pulse 61   Resp 14   Ht 5\' 5"  (1.651 m)   Wt 157 lb 3.2 oz (71.3 kg)   BMI 26.16 kg/m    Physical Exam Vitals and nursing  note reviewed.  Constitutional:      Appearance: She is well-developed.  HENT:     Head: Normocephalic and atraumatic.  Eyes:     Conjunctiva/sclera: Conjunctivae normal.  Cardiovascular:     Rate and Rhythm: Normal rate and regular rhythm.     Heart sounds: Normal heart sounds.  Pulmonary:     Effort: Pulmonary effort is normal.     Breath sounds: Normal breath sounds.  Abdominal:     General: Bowel sounds are normal.     Palpations: Abdomen is soft.  Musculoskeletal:     Cervical back: Normal range of motion.  Lymphadenopathy:     Cervical: No cervical adenopathy.  Skin:    General: Skin is warm and dry.     Capillary Refill: Capillary refill takes less than 2 seconds.  Neurological:     Mental Status: She is alert and oriented to person, place, and time.  Psychiatric:        Behavior: Behavior normal.      Musculoskeletal Exam: C-spine thoracic and lumbar spine with good range of motion.  Shoulder joints, elbow joints, wrist joints, MCPs and PIPs with good range of motion with no synovitis.  Hip joints, knee joints, ankles, MTPs and PIPs with good range of motion with no synovitis.  CDAI Exam: CDAI Score: -- Patient Global: --; Provider Global: -- Swollen: --; Tender: -- Joint Exam 10/03/2019   No joint exam has been documented for this visit   There is currently no information documented on the homunculus. Go to the Rheumatology activity and complete the homunculus joint exam.  Investigation: No additional findings.  Imaging: No results found.  Recent Labs: Lab Results  Component Value Date   WBC 3.1 (L) 08/16/2019   HGB 13.5 08/16/2019   PLT 318 08/16/2019   NA 139 08/16/2019   K 3.8 08/16/2019   CL 105 08/16/2019   CO2 27 08/16/2019   GLUCOSE 93 08/16/2019   BUN 12 08/16/2019   CREATININE 0.72 08/16/2019   BILITOT 0.6 08/16/2019   ALKPHOS 63 08/16/2019   AST 20 08/16/2019   ALT 16 08/16/2019   PROT 7.8 08/16/2019   ALBUMIN 4.0 08/16/2019    CALCIUM 9.2 08/16/2019   GFRAA >60 08/16/2019    Speciality Comments: No specialty comments available.  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  last OV 08/13/16.  Patient was tried on Plaquenil in the past but could not tolerate.  She also had no response to Plaquenil.  She wants to try pilocarpine again.  According to patient her eye exam has been normal.  She also has an MRI exam coming up.  Indications side effects contraindications were discussed.  I will send a prescription for pilocarpine 5 mg p.o. 3 times daily as needed.  Total 90 tablets with 2 refills were given.  Positive ANA (antinuclear antibody) -ENA was negative in the past.  We will check labs again today.  Plan: Sedimentation rate, Rheumatoid factor, ANA, Sjogrens syndrome-A extractable nuclear antibody, Sjogrens syndrome-B extractable nuclear antibody, Anti-DNA antibody, double-stranded, C3 and C4, RNP Antibody, Anti-Smith antibody, Anti-scleroderma antibody  Other fatigue -she has been experiencing other fatigue.  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  She states the study was negative for sleep apnea.  History of gastroesophageal reflux (GERD)-she has been followed by GI.  She states she had colonoscopy last year.  She continues to have some bloating.  History of hypercholesterolemia  Anxiety and depression-she is on BuSpar.  Orders: Orders Placed This Encounter  Procedures  . CBC with Differential/Platelet  . COMPLETE METABOLIC PANEL WITH GFR  . CK  . TSH  . Sedimentation rate  . Rheumatoid factor  . ANA  . Sjogrens syndrome-A extractable nuclear antibody  .  Sjogrens syndrome-B extractable nuclear antibody  . Anti-DNA antibody, double-stranded  . C3 and C4  . RNP Antibody  . Anti-Smith antibody  . Anti-scleroderma antibody  . Serum protein electrophoresis with reflex   Meds ordered this encounter  Medications  . pilocarpine (SALAGEN) 5 MG tablet    Sig: 1 tablet p.o. morning, noon and at bedtime as needed.    Dispense:  90 tablet    Refill:  2    Face-to-face time spent with patient was 45 minutes. Greater than 50% of time was spent in counseling and coordination of care.  Follow-Up Instructions: Return for Sicca symptoms.   Bo Merino, MD  Note - This record has been created using Editor, commissioning.  Chart creation errors have been sought, but may not always  have been located. Such creation errors do not reflect on  the standard of medical care.

## 2019-10-03 ENCOUNTER — Encounter: Payer: Self-pay | Admitting: Rheumatology

## 2019-10-03 ENCOUNTER — Other Ambulatory Visit: Payer: Self-pay

## 2019-10-03 ENCOUNTER — Ambulatory Visit: Payer: BC Managed Care – PPO | Admitting: Rheumatology

## 2019-10-03 VITALS — BP 97/60 | HR 61 | Resp 14 | Ht 65.0 in | Wt 157.2 lb

## 2019-10-03 DIAGNOSIS — Z8719 Personal history of other diseases of the digestive system: Secondary | ICD-10-CM

## 2019-10-03 DIAGNOSIS — M3509 Sicca syndrome with other organ involvement: Secondary | ICD-10-CM

## 2019-10-03 DIAGNOSIS — Z79899 Other long term (current) drug therapy: Secondary | ICD-10-CM

## 2019-10-03 DIAGNOSIS — R5383 Other fatigue: Secondary | ICD-10-CM

## 2019-10-03 DIAGNOSIS — M3500 Sicca syndrome, unspecified: Secondary | ICD-10-CM

## 2019-10-03 DIAGNOSIS — Z8639 Personal history of other endocrine, nutritional and metabolic disease: Secondary | ICD-10-CM

## 2019-10-03 DIAGNOSIS — G4711 Idiopathic hypersomnia with long sleep time: Secondary | ICD-10-CM | POA: Diagnosis not present

## 2019-10-03 DIAGNOSIS — F419 Anxiety disorder, unspecified: Secondary | ICD-10-CM

## 2019-10-03 DIAGNOSIS — F32A Depression, unspecified: Secondary | ICD-10-CM

## 2019-10-03 DIAGNOSIS — R768 Other specified abnormal immunological findings in serum: Secondary | ICD-10-CM

## 2019-10-03 DIAGNOSIS — F329 Major depressive disorder, single episode, unspecified: Secondary | ICD-10-CM

## 2019-10-03 MED ORDER — PILOCARPINE HCL 5 MG PO TABS: ORAL_TABLET | ORAL | 2 refills | Status: DC

## 2019-10-04 ENCOUNTER — Ambulatory Visit: Payer: BC Managed Care – PPO | Admitting: Nurse Practitioner

## 2019-10-04 NOTE — Progress Notes (Signed)
CK is elevated.  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  I will discuss results at the follow-up visit.

## 2019-10-08 LAB — PROTEIN ELECTROPHORESIS, SERUM, WITH REFLEX
Albumin ELP: 3.8 g/dL (ref 3.8–4.8)
Alpha 1: 0.2 g/dL (ref 0.2–0.3)
Alpha 2: 0.6 g/dL (ref 0.5–0.9)
Beta 2: 0.4 g/dL (ref 0.2–0.5)
Beta Globulin: 0.4 g/dL (ref 0.4–0.6)
Gamma Globulin: 1.4 g/dL (ref 0.8–1.7)
Total Protein: 6.8 g/dL (ref 6.1–8.1)

## 2019-10-08 LAB — SJOGRENS SYNDROME-B EXTRACTABLE NUCLEAR ANTIBODY: SSB (La) (ENA) Antibody, IgG: <1 AI

## 2019-10-08 LAB — COMPLETE METABOLIC PANEL WITH GFR
AG Ratio: 1.2 (calc) (ref 1.0–2.5)
ALT: 13 U/L (ref 6–29)
AST: 22 U/L (ref 10–35)
Albumin: 3.9 g/dL (ref 3.6–5.1)
Alkaline phosphatase (APISO): 66 U/L (ref 31–125)
BUN: 18 mg/dL (ref 7–25)
CO2: 27 mmol/L (ref 20–32)
Calcium: 9.1 mg/dL (ref 8.6–10.2)
Chloride: 106 mmol/L (ref 98–110)
Creat: 0.78 mg/dL (ref 0.50–1.10)
GFR, Est African American: 103 mL/min/{1.73_m2} (ref >=60)
GFR, Est Non African American: 89 mL/min/{1.73_m2} (ref >=60)
Globulin: 3.2 g/dL (calc) (ref 1.9–3.7)
Glucose, Bld: 81 mg/dL (ref 65–99)
Potassium: 4.3 mmol/L (ref 3.5–5.3)
Sodium: 139 mmol/L (ref 135–146)
Total Bilirubin: 0.3 mg/dL (ref 0.2–1.2)
Total Protein: 7.1 g/dL (ref 6.1–8.1)

## 2019-10-08 LAB — CBC WITH DIFFERENTIAL/PLATELET
Absolute Monocytes: 316 cells/uL (ref 200–950)
Basophils Absolute: 70 cells/uL (ref 0–200)
Basophils Relative: 1.8 %
Eosinophils Absolute: 70 cells/uL (ref 15–500)
Eosinophils Relative: 1.8 %
HCT: 39.5 % (ref 35.0–45.0)
Hemoglobin: 12.9 g/dL (ref 11.7–15.5)
Lymphs Abs: 1798 cells/uL (ref 850–3900)
MCH: 30.6 pg (ref 27.0–33.0)
MCHC: 32.7 g/dL (ref 32.0–36.0)
MCV: 93.6 fL (ref 80.0–100.0)
MPV: 10.3 fL (ref 7.5–12.5)
Monocytes Relative: 8.1 %
Neutro Abs: 1646 cells/uL (ref 1500–7800)
Neutrophils Relative %: 42.2 %
Platelets: 322 10*3/uL (ref 140–400)
RBC: 4.22 10*6/uL (ref 3.80–5.10)
RDW: 12.1 % (ref 11.0–15.0)
Total Lymphocyte: 46.1 %
WBC: 3.9 10*3/uL (ref 3.8–10.8)

## 2019-10-08 LAB — C3 AND C4
C3 Complement: 117 mg/dL (ref 83–193)
C4 Complement: 37 mg/dL (ref 15–57)

## 2019-10-08 LAB — ANTI-NUCLEAR AB-TITER (ANA TITER): ANA Titer 1: 1:320 {titer} — ABNORMAL HIGH

## 2019-10-08 LAB — RNP ANTIBODY: Ribonucleic Protein(ENA) Antibody, IgG: <1 AI

## 2019-10-08 LAB — SEDIMENTATION RATE: Sed Rate: 22 mm/h — ABNORMAL HIGH (ref 0–20)

## 2019-10-08 LAB — ANA: Anti Nuclear Antibody (ANA): POSITIVE — AB

## 2019-10-08 LAB — ANTI-SCLERODERMA ANTIBODY: Scleroderma (Scl-70) (ENA) Antibody, IgG: <1 AI

## 2019-10-08 LAB — CK: Total CK: 258 U/L — ABNORMAL HIGH (ref 29–143)

## 2019-10-08 LAB — ANTI-SMITH ANTIBODY: ENA SM Ab Ser-aCnc: <1 AI

## 2019-10-08 LAB — RHEUMATOID FACTOR: Rheumatoid fact SerPl-aCnc: <14 IU/mL (ref <14)

## 2019-10-08 LAB — SJOGRENS SYNDROME-A EXTRACTABLE NUCLEAR ANTIBODY: SSA (Ro) (ENA) Antibody, IgG: <1 AI

## 2019-10-08 LAB — ANTI-DNA ANTIBODY, DOUBLE-STRANDED: ds DNA Ab: <1 IU/mL

## 2019-10-08 LAB — TSH: TSH: 2.23 mIU/L

## 2019-10-11 DIAGNOSIS — H04123 Dry eye syndrome of bilateral lacrimal glands: Secondary | ICD-10-CM | POA: Diagnosis not present

## 2019-10-13 ENCOUNTER — Other Ambulatory Visit: Payer: Self-pay

## 2019-10-13 ENCOUNTER — Encounter: Payer: Self-pay | Admitting: Family Medicine

## 2019-10-13 ENCOUNTER — Ambulatory Visit: Payer: BC Managed Care – PPO | Admitting: Family Medicine

## 2019-10-13 VITALS — BP 124/66 | HR 74 | Temp 98.6°F | Resp 16 | Ht 65.0 in | Wt 155.0 lb

## 2019-10-13 DIAGNOSIS — L290 Pruritus ani: Secondary | ICD-10-CM | POA: Diagnosis not present

## 2019-10-13 DIAGNOSIS — R195 Other fecal abnormalities: Secondary | ICD-10-CM | POA: Diagnosis not present

## 2019-10-13 DIAGNOSIS — R197 Diarrhea, unspecified: Secondary | ICD-10-CM | POA: Diagnosis not present

## 2019-10-13 MED ORDER — HYDROCORTISONE ACETATE 25 MG RE SUPP: 25.0000 mg | Freq: Two times a day (BID) | RECTAL | 1 refills | Status: DC | PRN

## 2019-10-13 NOTE — Patient Instructions (Signed)
Use the suppository Return the stool samples F/U pending results

## 2019-10-13 NOTE — Progress Notes (Signed)
   Subjective:    [name], female    DOB: 10/16/1970, 49 y.o.   MRN: [id number]  Patient presents for GI Issues (constipation and greasy stools- states that anus is itching - also reports white stuff in her stool)   Pt here with concern for parasite infection in her stool., She noticed a few white spots in her stool. Some times has greasy stools.  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           Review Of Systems:  GEN- denies fatigue, fever, weight loss,weakness, recent illness HEENT- denies eye drainage, change in vision, nasal discharge, CVS- denies chest pain, palpitations RESP- denies SOB, cough, wheeze ABD- denies N/V, +change in stools, abd pain GU- denies dysuria, hematuria, dribbling, incontinence MSK- denies joint pain, muscle aches, injury Neuro- denies headache, dizziness, syncope, seizure activity       Objective:    BP 124/66   Pulse 74   Temp 98.6 F (37 C) (Temporal)   Resp 16   Ht 5\' 5"  (1.651 m)   Wt 155 lb (70.3 kg)   SpO2 98%   BMI 25.79 kg/m  GEN- NAD, alert and oriented x3, nurse present CVS- RRR, no murmur RESP-CTAB ABD-NABS,soft,NT,ND Rectum- mild erythema,small hemorrhoidal tag, no ulcerations, scotch tape applied no eggs seen EXT- No edema Pulses- Radial  2+        Assessment & Plan:      Problem List Items Addressed This Visit    None    Visit Diagnoses    Diarrhea, unspecified type    -  Primary   Stool changes but chronic GI upset and bowe issues between loose and constipation. she is concerned she has parastite  infection. Scotch tape testing best performed in AM, but unable to do so, nothing seen on microscopy suggestive of pin worms Though stool cultures are low yield for pinworms, will go ahead and send her with stool cultures and will check for Ova and parastie, she has some irritation rectal area Will start hydrocortisone suppositories to see if this helps If nothing found and topical doesn't help, and nothing new on GYN appt Monday  refer back to her GI    Relevant Orders   Stool culture   Ova and Parasite Examination   Change in stool       Relevant Orders   Stool culture   Ova and Parasite Examination   Rectal itching    - per above, no sign of HSV outbreak today, steroid suppository given       Note: This dictation was prepared with Dragon dictation along with smaller Company secretary. Any transcriptional errors that result from this process are unintentional.

## 2019-10-15 ENCOUNTER — Encounter: Payer: Self-pay | Admitting: Family Medicine

## 2019-10-15 NOTE — Progress Notes (Deleted)
Office Visit Note  Patient: Yvette Mayer             Date of Birth: 06/08/1970           MRN: [id number]             PCP: Alycia Rossetti, MD Referring: Alycia Rossetti, MD Visit Date: 10/24/2019 Occupation: _0 @  Subjective:  No chief complaint on file.   History of Present Illness: Yvette Mayer is a 49 y.o. female ***   Activities of Daily Living:  Patient reports morning stiffness for *** {minute/hour:19697}.   Patient {ACTIONS;DENIES/REPORTS:21021675::"Denies"} nocturnal pain.  Difficulty dressing/grooming: {ACTIONS;DENIES/REPORTS:21021675::"Denies"} Difficulty climbing stairs: {ACTIONS;DENIES/REPORTS:21021675::"Denies"} Difficulty getting out of chair: {ACTIONS;DENIES/REPORTS:21021675::"Denies"} Difficulty using hands for taps, buttons, cutlery, and/or writing: {ACTIONS;DENIES/REPORTS:21021675::"Denies"}  No Rheumatology ROS completed.   PMFS History:  Patient Active Problem List   Diagnosis Date Noted  . Pain with urination 09/18/2019  . Menopause 04/06/2019  . Sleep disturbance 04/06/2019  . Current use of estrogen therapy 04/06/2019  . Carpal tunnel syndrome 03/06/2019  . Sensation of fullness in both ears 03/01/2019  . Abdominal pain 03/02/2018  . History of UTI 11/29/2017  . Dyspareunia, female 11/29/2017  . Seasonal and perennial allergic rhinitis 10/12/2017  . Adverse food reaction 10/12/2017  . ANA positive Speckled 1:320 titer ENA negative  11/20/2016  . Leukopenia 11/11/2016  . History of bruising easily 09/08/2016  . Borderline high cholesterol 09/08/2016  . Situational mixed anxiety and depressive disorder 08/24/2016  . Rectocele 07/16/2016  . Kidney stone on right side 10/24/2015  . Hot flashes 04/10/2015  . Symptoms, such as flushing, sleeplessness, headache, lack of concentration, associated with the menopause 04/10/2015  . 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 BV (bacterial vaginosis) 03/21/2014  . Constipation 12/26/2013  . Overweight  (BMI 25.0-29.9) 12/26/2013  . Sjoegren syndrome 08/09/2013  . Chronic insomnia 09/13/2012  . GERD (gastroesophageal reflux disease) 09/18/2011  . Anxiety 09/03/2011  . Hyperlipidemia 09/02/2011  . Fibromyalgia 09/02/2011  . Chronic nasal congestion 09/02/2011  . Vitamin D deficiency 09/02/2011  . Dysphagia 01/13/2011    Past Medical History:  Diagnosis Date  . Abdominal pain 06/12/2014  . Allergy   . Anemia    iron 1 yr ago, normal hgb 7/12  . Anxiety   . Asthma    pt says no-bronchitis  . Bloated abdomen 10/15/2015  . Chronic back pain   . Chronic headaches   . Chronic neck pain   . Chronic sinusitis   . Current use of estrogen therapy 04/16/2015  . Dyspareunia 09/12/2014  . Elevated TSH 04/16/2015  . Fatigue 04/10/2015  . Fibromyalgia   . Gas 10/15/2015  . GERD (gastroesophageal reflux disease)   . Herpes simplex virus (HSV) infection   . History of kidney stones   . Hot flashes 04/10/2015  . Hyperlipidemia   . Kidney stone on right side 10/24/2015  . Mental disorder   . Ovarian cyst, right   . Paresthesias   . Shortness of breath   . Sinus drainage   . Sjoegren syndrome   . Symptoms, such as flushing, sleeplessness, headache, lack of concentration, associated with the menopause 04/10/2015  . TMJ (dislocation of temporomandibular joint)   . Vaginal discharge 08/08/2015  . Vertigo   . Yeast infection 10/15/2015    Family History  Problem Relation Age of Onset  . Hypertension Mother   . Thyroid disease Sister        overactive  . Asthma Sister   . Eczema  Sister   . Bronchitis Son   . Heart disease Maternal Grandmother   . Hypertension Maternal Grandmother   . Alcohol abuse Maternal Grandfather   . Scoliosis Son   . Other Maternal Aunt        Sjeogren syndrome  . Other Maternal Aunt        Sjoegren syndrome  . Other Maternal Aunt        Sjoegren syndrome  . Anesthesia problems Neg Hx   . Malignant hyperthermia Neg Hx   . Hypotension Neg Hx   . Pseudochol  deficiency Neg Hx   . Colon cancer Neg Hx    Past Surgical History:  Procedure Laterality Date  . ABDOMINAL HYSTERECTOMY    . BIOPSY  09/09/2016   Procedure: BIOPSY;  Surgeon: Daneil Dolin, MD;  [facility]: [facility];  Service: Endoscopy;;  esophageal  . BREAST CYST EXCISION Right 1980   benign  . CESAREAN SECTION     x2  . CHOLECYSTECTOMY  2005   cholelithiasis  . COLONOSCOPY N/A 09/09/2016   Procedure: COLONOSCOPY;  Surgeon: Daneil Dolin, MD;  [facility]: [facility];  Service: Endoscopy;  Laterality: N/A;  8:30am  . ESOPHAGOGASTRODUODENOSCOPY  01/23/2011   ELF:[id number] undulating Z-line vs short segment Barrett s/p bx (NOT BARRETT's)/small HH otherwise normal  . ESOPHAGOGASTRODUODENOSCOPY N/A 09/09/2016   Procedure: ESOPHAGOGASTRODUODENOSCOPY (EGD);  Surgeon: Daneil Dolin, MD;  [facility];  Service: Endoscopy;  Laterality: N/A;  Venia Minks DILATION  01/23/2011   Procedure: Venia Minks DILATION;  Surgeon: Daneil Dolin, MD;  [facility];  Service: Endoscopy;  Laterality: N/A;  . Venia Minks DILATION N/A 09/09/2016   Procedure: Venia Minks DILATION;  Surgeon: Daneil Dolin, MD;  [facility];  Service: Endoscopy;  Laterality: N/A;  . OVARIAN CYST REMOVAL  92 removal of cysts from behind ovaries  . PARTIAL HYSTERECTOMY  93  . SEPTOPLASTY     2011  . TEMPOROMANDIBULAR JOINT SURGERY    . TONSILLECTOMY     2010  . TURBINATE RESECTION Bilateral 05/29/2013   Procedure: TURBINATE RESECTION;  Surgeon: Ascencion Dike, MD;  Location: [facility];  Service: ENT;  Laterality: Bilateral;  . UPPER GASTROINTESTINAL ENDOSCOPY     Social History   Social History Narrative  . Not on file   Immunization History  Administered Date(s) Administered  . Tdap 09/18/2011     Objective: Vital Signs: There were no vitals taken for this visit.   Physical Exam   Musculoskeletal Exam: ***  CDAI Exam: CDAI Score: -- Patient Global: --; Provider Global:  -- Swollen: --; Tender: -- Joint Exam 10/24/2019   No joint exam has been documented for this visit   There is currently no information documented on the homunculus. Go to the Rheumatology activity and complete the homunculus joint exam.  Investigation: No additional findings.  Imaging: No results found.  Recent Labs: Lab Results  Component Value Date   WBC 3.9 10/03/2019   HGB 12.9 10/03/2019   PLT 322 10/03/2019   NA 139 10/03/2019   K 4.3 10/03/2019   CL 106 10/03/2019   CO2 27 10/03/2019   GLUCOSE 81 10/03/2019   BUN 18 10/03/2019   CREATININE 0.78 10/03/2019   BILITOT 0.3 10/03/2019   ALKPHOS 63 08/16/2019   AST 22 10/03/2019   ALT 13 10/03/2019   PROT 7.1 10/03/2019   PROT 6.8 10/03/2019   ALBUMIN 4.0 08/16/2019   CALCIUM 9.1 10/03/2019  GFRAA 103 10/03/2019   Oct 03, 2019 SPEP normal, CK 258, TSH normal, ESR 22, RF negative, ANA 1: 320NS, ENA negative, C3-C4 normal  Speciality Comments: No specialty comments available.  Procedures:  No procedures performed Allergies: Hydrocodone-acetaminophen, Percocet [oxycodone-acetaminophen], and Plaquenil [hydroxychloroquine sulfate]   Assessment / Plan:     Visit Diagnoses: No diagnosis found.  Orders: No orders of the defined types were placed in this encounter.  No orders of the defined types were placed in this encounter.   Face-to-face time spent with patient was *** minutes. Greater than 50% of time was spent in counseling and coordination of care.  Follow-Up Instructions: No follow-ups on file.   Bo Merino, MD  Note - This record has been created using Editor, commissioning.  Chart creation errors have been sought, but may not always  have been located. Such creation errors do not reflect on  the standard of medical care.

## 2019-10-16 ENCOUNTER — Ambulatory Visit: Payer: BC Managed Care – PPO | Admitting: Adult Health

## 2019-10-16 ENCOUNTER — Encounter: Payer: Self-pay | Admitting: Adult Health

## 2019-10-16 ENCOUNTER — Other Ambulatory Visit: Payer: BC Managed Care – PPO

## 2019-10-16 VITALS — BP 99/66 | HR 63 | Ht 65.0 in | Wt 154.0 lb

## 2019-10-16 DIAGNOSIS — R195 Other fecal abnormalities: Secondary | ICD-10-CM

## 2019-10-16 DIAGNOSIS — N898 Other specified noninflammatory disorders of vagina: Secondary | ICD-10-CM

## 2019-10-16 DIAGNOSIS — F419 Anxiety disorder, unspecified: Secondary | ICD-10-CM

## 2019-10-16 DIAGNOSIS — R197 Diarrhea, unspecified: Secondary | ICD-10-CM

## 2019-10-16 DIAGNOSIS — N816 Rectocele: Secondary | ICD-10-CM | POA: Diagnosis not present

## 2019-10-16 NOTE — Progress Notes (Signed)
  Subjective:     [name], female   DOB: [birth year], 49 y.o.   MRN: [id number]  HPI Yvette Mayer is a 49 year old black female, divorced, sp hysterectomy back in follow up on starting buspar and requests pelvic exam for vaginal discharge.  Has seen RA doctor and had labs.Seeing Dr Buelah Manis for GI issues. PCP is Dr Buelah Manis.    Review of Systems Anxiety is better, things better at work +vaginal discharge Hot flashes better   Reviewed past medical,surgical, social and family history. Reviewed medications and allergies.     Objective:   Physical Exam BP 99/66 (BP Location: Left Arm, Patient Position: Sitting, Cuff Size: Normal)   Pulse 63   Ht 5\' 5"  (1.651 m)   Wt 154 lb (69.9 kg)   BMI 25.63 kg/m  Skin warm and dry.Pelvic: external genitalia is normal in appearance no lesions, vagina: white discharge without odor,urethra has no lesions or masses noted, cervix and uterus are absent, adnexa: no masses or tenderness noted. Bladder is non tender and no masses felt, on rectal exam has good tone, no masses and mild rectocele. Nuswab obtained.Fall risk is low PHQ 2 score is 0. Examination chaperoned by Celene Squibb LPN    Assessment:     1. Vaginal discharge Nuswab sent   2. Rectocele, no change  3. Anxiety Continue Buspar, has refills     Plan:     Follow up in 3 months of sooner if needed

## 2019-10-18 ENCOUNTER — Ambulatory Visit (INDEPENDENT_AMBULATORY_CARE_PROVIDER_SITE_OTHER): Payer: BC Managed Care – PPO

## 2019-10-18 DIAGNOSIS — J309 Allergic rhinitis, unspecified: Secondary | ICD-10-CM | POA: Diagnosis not present

## 2019-10-18 LAB — NUSWAB VAGINITIS PLUS (VG+)
Candida albicans, NAA: NEGATIVE
Candida glabrata, NAA: NEGATIVE
Chlamydia trachomatis, NAA: NEGATIVE
Neisseria gonorrhoeae, NAA: NEGATIVE
Trich vag by NAA: NEGATIVE

## 2019-10-19 ENCOUNTER — Telehealth: Payer: BC Managed Care – PPO | Admitting: Nurse Practitioner

## 2019-10-19 NOTE — Telephone Encounter (Signed)
Received an approval letter for [employer]. Approval is good through 10/17/20. Pt is aware of approval. Approval letter will be scanned in pts chart.

## 2019-10-20 LAB — STOOL CULTURE
MICRO NUMBER:: 10485368
MICRO NUMBER:: 10485369
MICRO NUMBER:: 10485371
SHIGA RESULT:: NOT DETECTED
SPECIMEN QUALITY:: ADEQUATE
SPECIMEN QUALITY:: ADEQUATE
SPECIMEN QUALITY:: ADEQUATE

## 2019-10-20 LAB — OVA AND PARASITE EXAMINATION
CONCENTRATE RESULT:: NONE SEEN
MICRO NUMBER:: 10485370
SPECIMEN QUALITY:: ADEQUATE
TRICHROME RESULT:: NONE SEEN

## 2019-10-23 NOTE — Progress Notes (Signed)
Office Visit Note  Patient: Yvette Mayer             Date of Birth: 1971-01-10           MRN: [id number]             PCP: Alycia Rossetti, MD Referring: Alycia Rossetti, MD Visit Date: 11/02/2019 Occupation: _0 @  Subjective:  Dry mouth, dry eyes, muscle pain   History of Present Illness: Yvette Mayer is a 49 y.o. female with history of sicca symptoms and fibromyalgia.  She states she continues to have dry mouth and dry eyes.  She was recently seen by her ophthalmologist who gave her prednisone eyedrops due to dry eyes and allergies.  She states she is feeling better as regards to her allergies.  She has been also using over-the-counter products which has been helpful.  Pilocarpine has helped her symptoms.  She states she is postmenopausal which also causes dryness.  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  She states her muscles always ache.  She also has longstanding history of fibromyalgia which causes muscle discomfort.  She denies any muscle weakness.  She states she goes for 90 minutes massage is for muscle pain.  Activities of Daily Living:  Patient reports morning stiffness for 24 hours.   Patient Reports nocturnal pain.  Difficulty dressing/grooming: Denies Difficulty climbing stairs: Denies Difficulty getting out of chair: Denies Difficulty using hands for taps, buttons, cutlery, and/or writing: Denies  Review of Systems  Constitutional: Positive for fatigue. Negative for night sweats, weight gain and weight loss.  HENT: Positive for mouth dryness and nose dryness. Negative for mouth sores, trouble swallowing and trouble swallowing.   Eyes: Positive for dryness. Negative for pain, redness, itching and visual disturbance.  Respiratory: Negative for cough, shortness of breath and difficulty breathing.   Cardiovascular: Negative for chest pain, palpitations, hypertension, irregular heartbeat and swelling in legs/feet.  Gastrointestinal: Positive for  constipation and diarrhea. Negative for blood in stool.       History of IBS  Endocrine: Negative for increased urination.  Genitourinary: Negative for difficulty urinating and vaginal dryness.  Musculoskeletal: Positive for arthralgias, joint pain, myalgias, morning stiffness, muscle tenderness and myalgias. Negative for joint swelling and muscle weakness.  Skin: Negative for color change, rash, hair loss, redness, skin tightness, ulcers and sensitivity to sunlight.  Allergic/Immunologic: Negative for susceptible to infections.  Neurological: Positive for weakness. Negative for dizziness, numbness, headaches, memory loss and night sweats.  Hematological: Negative for bruising/bleeding tendency and swollen glands.  Psychiatric/Behavioral: Negative for depressed mood, confusion and sleep disturbance. The patient is nervous/anxious.     PMFS History:  Patient Active Problem List   Diagnosis Date Noted  . Vaginal discharge 10/16/2019  . Pain with urination 09/18/2019  . Menopause 04/06/2019  . Sleep disturbance 04/06/2019  . Current use of estrogen therapy 04/06/2019  . Carpal tunnel syndrome 03/06/2019  . Sensation of fullness in both ears 03/01/2019  . Abdominal pain 03/02/2018  . History of UTI 11/29/2017  . Dyspareunia, female 11/29/2017  . Seasonal and perennial allergic rhinitis 10/12/2017  . Adverse food reaction 10/12/2017  . ANA positive Speckled 1:320 titer ENA negative  11/20/2016  . Leukopenia 11/11/2016  . History of bruising easily 09/08/2016  . Borderline high cholesterol 09/08/2016  . Situational mixed anxiety and depressive disorder 08/24/2016  . Rectocele 07/16/2016  . Kidney stone on right side 10/24/2015  . Hot flashes 04/10/2015  .  Symptoms, such as flushing, sleeplessness, headache, lack of concentration, associated with the menopause 04/10/2015  . 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 BV (bacterial vaginosis) 03/21/2014  . Constipation 12/26/2013  . Overweight (BMI  25.0-29.9) 12/26/2013  . Sjoegren syndrome 08/09/2013  . Chronic insomnia 09/13/2012  . GERD (gastroesophageal reflux disease) 09/18/2011  . Anxiety 09/03/2011  . Hyperlipidemia 09/02/2011  . Fibromyalgia 09/02/2011  . Chronic nasal congestion 09/02/2011  . Vitamin D deficiency 09/02/2011  . Dysphagia 01/13/2011    Past Medical History:  Diagnosis Date  . Abdominal pain 06/12/2014  . Allergy   . Anemia    iron 1 yr ago, normal hgb 7/12  . Anxiety   . Asthma    pt says no-bronchitis  . Bloated abdomen 10/15/2015  . Chronic back pain   . Chronic headaches   . Chronic neck pain   . Chronic sinusitis   . Current use of estrogen therapy 04/16/2015  . Dyspareunia 09/12/2014  . Elevated TSH 04/16/2015  . Fatigue 04/10/2015  . Fibromyalgia   . Gas 10/15/2015  . GERD (gastroesophageal reflux disease)   . Herpes simplex virus (HSV) infection   . History of kidney stones   . Hot flashes 04/10/2015  . Hyperlipidemia   . Kidney stone on right side 10/24/2015  . Mental disorder   . Ovarian cyst, right   . Paresthesias   . Shortness of breath   . Sinus drainage   . Sjoegren syndrome   . Symptoms, such as flushing, sleeplessness, headache, lack of concentration, associated with the menopause 04/10/2015  . TMJ (dislocation of temporomandibular joint)   . Vaginal discharge 08/08/2015  . Vertigo   . Yeast infection 10/15/2015    Family History  Problem Relation Age of Onset  . Hypertension Mother   . Thyroid disease Sister        overactive  . Asthma Sister   . Eczema Sister   . Bronchitis Son   . Heart disease Maternal Grandmother   . Hypertension Maternal Grandmother   . Alcohol abuse Maternal Grandfather   . Scoliosis Son   . Other Maternal Aunt        Sjeogren syndrome  . Other Maternal Aunt        Sjoegren syndrome  . Other Maternal Aunt        Sjoegren syndrome  . Anesthesia problems Neg Hx   . Malignant hyperthermia Neg Hx   . Hypotension Neg Hx   . Pseudochol  deficiency Neg Hx   . Colon cancer Neg Hx    Past Surgical History:  Procedure Laterality Date  . ABDOMINAL HYSTERECTOMY    . BIOPSY  09/09/2016   Procedure: BIOPSY;  Surgeon: Daneil Dolin, MD;  [facility];  Service: Endoscopy;;  esophageal  . BREAST CYST EXCISION Right 1980   benign  . CESAREAN SECTION     x2  . CHOLECYSTECTOMY  2005   cholelithiasis  . COLONOSCOPY N/A 09/09/2016   Procedure: COLONOSCOPY;  Surgeon: Daneil Dolin, MD;  [facility];  Service: Endoscopy;  Laterality: N/A;  8:30am  . ESOPHAGOGASTRODUODENOSCOPY  01/23/2011   ZOX:[id number] undulating Z-line vs short segment Barrett s/p bx (NOT BARRETT's)/small HH otherwise normal  . ESOPHAGOGASTRODUODENOSCOPY N/A 09/09/2016   Procedure: ESOPHAGOGASTRODUODENOSCOPY (EGD);  Surgeon: Daneil Dolin, MD;  [facility];  Service: Endoscopy;  Laterality: N/A;  Venia Minks DILATION  01/23/2011   Procedure: Venia Minks DILATION;  Surgeon: Daneil Dolin, MD;  [facility];  Service: Endoscopy;  Laterality: N/A;  . Venia Minks DILATION N/A 09/09/2016   Procedure: Venia Minks DILATION;  Surgeon: Daneil Dolin, MD;  [facility];  Service: Endoscopy;  Laterality: N/A;  . OVARIAN CYST REMOVAL  92 removal of cysts from behind ovaries  . PARTIAL HYSTERECTOMY  93  . SEPTOPLASTY     2011  . TEMPOROMANDIBULAR JOINT SURGERY    . TONSILLECTOMY     2010  . TURBINATE RESECTION Bilateral 05/29/2013   Procedure: TURBINATE RESECTION;  Surgeon: Ascencion Dike, MD;  Location: [facility];  Service: ENT;  Laterality: Bilateral;  . UPPER GASTROINTESTINAL ENDOSCOPY     Social History   Social History Narrative  . Not on file   Immunization History  Administered Date(s) Administered  . Tdap 09/18/2011     Objective: Vital Signs: BP 99/66 (BP Location: Left Arm, Patient Position: Sitting, Cuff Size: Normal)   Pulse 71   Resp 14   Ht _0  (1.651 m)   Wt 155 lb 6.4 oz (70.5 kg)   BMI  25.86 kg/m    Physical Exam Vitals and nursing note reviewed.  Constitutional:      Appearance: She is well-developed.  HENT:     Head: Normocephalic and atraumatic.  Eyes:     Conjunctiva/sclera: Conjunctivae normal.  Cardiovascular:     Rate and Rhythm: Normal rate and regular rhythm.     Heart sounds: Normal heart sounds.  Pulmonary:     Effort: Pulmonary effort is normal.     Breath sounds: Normal breath sounds.  Abdominal:     General: Bowel sounds are normal.     Palpations: Abdomen is soft.  Musculoskeletal:     Cervical back: Normal range of motion.  Lymphadenopathy:     Cervical: No cervical adenopathy.  Skin:    General: Skin is warm and dry.     Capillary Refill: Capillary refill takes less than 2 seconds.  Neurological:     Mental Status: She is alert and oriented to person, place, and time.  Psychiatric:        Behavior: Behavior normal.      Musculoskeletal Exam: C-spine thoracic and lumbar spine with good range of motion.  Shoulder joints, elbow joints, wrist joints, MCPs PIPs and DIPs with good range of motion with no synovitis.  Hip joints, knee joints, ankles, MTPs and PIPs with good range of motion with no synovitis.  She had no muscular weakness or tenderness.  She had no difficulty getting up from the squatting position.  CDAI Exam: CDAI Score: -- Patient Global: --; Provider Global: -- Swollen: --; Tender: -- Joint Exam 11/02/2019   No joint exam has been documented for this visit   There is currently no information documented on the homunculus. Go to the Rheumatology activity and complete the homunculus joint exam.  Investigation: No additional findings.  Imaging: No results found.  Recent Labs: Lab Results  Component Value Date   WBC 3.9 10/03/2019   HGB 12.9 10/03/2019   PLT 322 10/03/2019   NA 139 10/03/2019   K 4.3 10/03/2019   CL 106 10/03/2019   CO2 27 10/03/2019   GLUCOSE 81 10/03/2019   BUN 18 10/03/2019   CREATININE 0.78  10/03/2019   BILITOT 0.3 10/03/2019   ALKPHOS 63 08/16/2019   AST 22 10/03/2019   ALT 13 10/03/2019   PROT 7.1 10/03/2019   PROT 6.8 10/03/2019   ALBUMIN 4.0 08/16/2019   CALCIUM 9.1 10/03/2019  GFRAA 103 10/03/2019  Oct 03, 2015 SPEP normal, TSH normal, CK 258, ANA 1: 320NS, ENA negative, C3-C4 normal, ESR 22, RF-  Speciality Comments: No specialty comments available.  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  Patient had intolerance to Plaquenil in the past.  She was placed on pilocarpine at the last visit.  She has had good response to pilocarpine.  She is also using over-the-counter products.  Positive ANA (antinuclear antibody) - ENA negative.  Left findings were discussed at length.  Elevated CK -patient has elevated CK.  She does heavy lifting at her work and also gets massages.  She has not had a massage in the last 3 weeks.  Plan repeat CK and aldolase. - Plan: CK, Aldolase, Myositis Assessr Plus Jo-1 Autoabs  Other fatigue-she gives history of chronic fatigue.  She believes her fatigue is related to fibromyalgia.  She was diagnosed with fibromyalgia several years ago.  Fibromyalgia-she has history of generalized pain positive tender points and fatigue.  History of hypercholesterolemia  History of gastroesophageal reflux (GERD)  Anxiety and depression-she states her anxiety is getting worse.  Have advised her to schedule an appointment with her PCP.  Idiopathic hypersomnolence    Orders: Orders Placed This Encounter  Procedures  . CK  . Aldolase  . Myositis Assessr Plus Jo-1 Autoabs   No orders of the defined types were placed in this encounter.     Follow-Up Instructions: Return in about 2 months (around 01/02/2020) for Sicca, +ANA, elevated  CK.   Bo Merino, MD  Note - This record has been created using Editor, commissioning.  Chart creation errors have been sought, but may not always  have been located. Such creation errors do not reflect on  the standard of medical care.

## 2019-10-24 ENCOUNTER — Ambulatory Visit: Payer: BC Managed Care – PPO | Admitting: Rheumatology

## 2019-10-25 ENCOUNTER — Ambulatory Visit: Payer: Self-pay

## 2019-11-01 ENCOUNTER — Ambulatory Visit (INDEPENDENT_AMBULATORY_CARE_PROVIDER_SITE_OTHER): Payer: BC Managed Care – PPO

## 2019-11-01 DIAGNOSIS — L739 Follicular disorder, unspecified: Secondary | ICD-10-CM | POA: Diagnosis not present

## 2019-11-01 DIAGNOSIS — H04123 Dry eye syndrome of bilateral lacrimal glands: Secondary | ICD-10-CM | POA: Diagnosis not present

## 2019-11-01 DIAGNOSIS — J309 Allergic rhinitis, unspecified: Secondary | ICD-10-CM | POA: Diagnosis not present

## 2019-11-02 ENCOUNTER — Encounter: Payer: Self-pay | Admitting: Rheumatology

## 2019-11-02 ENCOUNTER — Ambulatory Visit: Payer: BC Managed Care – PPO | Admitting: Rheumatology

## 2019-11-02 ENCOUNTER — Other Ambulatory Visit: Payer: Self-pay

## 2019-11-02 VITALS — BP 99/66 | HR 71 | Resp 14 | Ht 65.0 in | Wt 155.4 lb

## 2019-11-02 DIAGNOSIS — R748 Abnormal levels of other serum enzymes: Secondary | ICD-10-CM | POA: Diagnosis not present

## 2019-11-02 DIAGNOSIS — G4711 Idiopathic hypersomnia with long sleep time: Secondary | ICD-10-CM

## 2019-11-02 DIAGNOSIS — M3500 Sicca syndrome, unspecified: Secondary | ICD-10-CM | POA: Diagnosis not present

## 2019-11-02 DIAGNOSIS — R5383 Other fatigue: Secondary | ICD-10-CM

## 2019-11-02 DIAGNOSIS — F419 Anxiety disorder, unspecified: Secondary | ICD-10-CM

## 2019-11-02 DIAGNOSIS — F32A Depression, unspecified: Secondary | ICD-10-CM

## 2019-11-02 DIAGNOSIS — Z8719 Personal history of other diseases of the digestive system: Secondary | ICD-10-CM

## 2019-11-02 DIAGNOSIS — M797 Fibromyalgia: Secondary | ICD-10-CM

## 2019-11-02 DIAGNOSIS — R768 Other specified abnormal immunological findings in serum: Secondary | ICD-10-CM | POA: Diagnosis not present

## 2019-11-02 DIAGNOSIS — Z8639 Personal history of other endocrine, nutritional and metabolic disease: Secondary | ICD-10-CM

## 2019-11-02 DIAGNOSIS — F329 Major depressive disorder, single episode, unspecified: Secondary | ICD-10-CM

## 2019-11-03 ENCOUNTER — Telehealth: Payer: Self-pay | Admitting: Family Medicine

## 2019-11-03 MED ORDER — VALACYCLOVIR HCL 1 G PO TABS: 1000.0000 mg | ORAL_TABLET | Freq: Two times a day (BID) | ORAL | 0 refills | Status: DC

## 2019-11-03 NOTE — Progress Notes (Signed)
CK is normal now.

## 2019-11-03 NOTE — Telephone Encounter (Signed)
CB# [phone]  Refill on Valtrex

## 2019-11-03 NOTE — Telephone Encounter (Signed)
Prescription sent to pharmacy.

## 2019-11-05 NOTE — Progress Notes (Signed)
Aldolase is normal.

## 2019-11-06 DIAGNOSIS — M25512 Pain in left shoulder: Secondary | ICD-10-CM | POA: Diagnosis not present

## 2019-11-06 DIAGNOSIS — M542 Cervicalgia: Secondary | ICD-10-CM | POA: Diagnosis not present

## 2019-11-06 DIAGNOSIS — M26602 Left temporomandibular joint disorder, unspecified: Secondary | ICD-10-CM | POA: Diagnosis not present

## 2019-11-10 ENCOUNTER — Ambulatory Visit (INDEPENDENT_AMBULATORY_CARE_PROVIDER_SITE_OTHER): Payer: BC Managed Care – PPO

## 2019-11-10 DIAGNOSIS — J309 Allergic rhinitis, unspecified: Secondary | ICD-10-CM

## 2019-11-13 DIAGNOSIS — J301 Allergic rhinitis due to pollen: Secondary | ICD-10-CM | POA: Diagnosis not present

## 2019-11-13 DIAGNOSIS — Z77120 Contact with and (suspected) exposure to mold (toxic): Secondary | ICD-10-CM | POA: Diagnosis not present

## 2019-11-13 DIAGNOSIS — G44229 Chronic tension-type headache, not intractable: Secondary | ICD-10-CM | POA: Diagnosis not present

## 2019-11-14 LAB — MYOSITIS ASSESSR PLUS JO-1 AUTOABS
EJ Autoabs: NOT DETECTED
Jo-1 Autoabs: <1 AI
Ku Autoabs: NOT DETECTED
Mi-2 Autoabs: NOT DETECTED
OJ Autoabs: NOT DETECTED
PL-12 Autoabs: NOT DETECTED
PL-7 Autoabs: NOT DETECTED
SRP Autoabs: NOT DETECTED

## 2019-11-14 LAB — ALDOLASE: Aldolase: 3.2 U/L (ref <=8.1)

## 2019-11-14 LAB — CK: Total CK: 132 U/L (ref 29–143)

## 2019-11-15 NOTE — Progress Notes (Signed)
All the labs are within normal limits.

## 2019-11-29 DIAGNOSIS — H00021 Hordeolum internum right upper eyelid: Secondary | ICD-10-CM | POA: Diagnosis not present

## 2019-12-06 ENCOUNTER — Ambulatory Visit: Payer: BC Managed Care – PPO | Admitting: Family Medicine

## 2019-12-07 ENCOUNTER — Other Ambulatory Visit (HOSPITAL_COMMUNITY): Payer: Self-pay | Admitting: Family Medicine

## 2019-12-07 DIAGNOSIS — Z1231 Encounter for screening mammogram for malignant neoplasm of breast: Secondary | ICD-10-CM

## 2019-12-15 ENCOUNTER — Ambulatory Visit (HOSPITAL_COMMUNITY): Payer: BC Managed Care – PPO

## 2019-12-16 ENCOUNTER — Emergency Department (HOSPITAL_COMMUNITY)
Admission: EM | Admit: 2019-12-16 | Discharge: 2019-12-16 | Discharge status: Against Medical Advice | Payer: BC Managed Care – PPO

## 2019-12-16 ENCOUNTER — Other Ambulatory Visit: Payer: Self-pay

## 2019-12-24 ENCOUNTER — Other Ambulatory Visit: Payer: Self-pay | Admitting: Gastroenterology

## 2019-12-29 ENCOUNTER — Other Ambulatory Visit: Payer: Self-pay | Admitting: Adult Health

## 2020-01-01 ENCOUNTER — Other Ambulatory Visit: Payer: Self-pay | Admitting: Rheumatology

## 2020-01-01 ENCOUNTER — Ambulatory Visit (HOSPITAL_COMMUNITY)
Admission: RE | Admit: 2020-01-01 | Discharge: 2020-01-01 | Disposition: A | Discharge status: Home / Self Care | Payer: BC Managed Care – PPO | Source: Ambulatory Visit | Attending: Family Medicine | Admitting: Family Medicine

## 2020-01-01 ENCOUNTER — Other Ambulatory Visit: Payer: Self-pay

## 2020-01-01 DIAGNOSIS — Z1231 Encounter for screening mammogram for malignant neoplasm of breast: Secondary | ICD-10-CM | POA: Diagnosis not present

## 2020-01-01 NOTE — Telephone Encounter (Signed)
Last Visit: 11/02/2019 Next Visit: 01/01/2020  Okay to refill per Dr. Estanislado Pandy

## 2020-01-03 ENCOUNTER — Other Ambulatory Visit: Payer: Self-pay

## 2020-01-03 ENCOUNTER — Ambulatory Visit: Payer: BC Managed Care – PPO | Admitting: Family Medicine

## 2020-01-03 ENCOUNTER — Encounter: Payer: Self-pay | Admitting: Family Medicine

## 2020-01-03 VITALS — BP 118/66 | HR 68 | Temp 98.6°F | Resp 14 | Ht 65.0 in | Wt 156.0 lb

## 2020-01-03 DIAGNOSIS — R350 Frequency of micturition: Secondary | ICD-10-CM | POA: Diagnosis not present

## 2020-01-03 DIAGNOSIS — F419 Anxiety disorder, unspecified: Secondary | ICD-10-CM

## 2020-01-03 DIAGNOSIS — N898 Other specified noninflammatory disorders of vagina: Secondary | ICD-10-CM | POA: Diagnosis not present

## 2020-01-03 DIAGNOSIS — M26609 Unspecified temporomandibular joint disorder, unspecified side: Secondary | ICD-10-CM | POA: Diagnosis not present

## 2020-01-03 LAB — URINALYSIS, ROUTINE W REFLEX MICROSCOPIC
Bacteria, UA: NONE SEEN /HPF
Bilirubin Urine: NEGATIVE
Glucose, UA: NEGATIVE
Hyaline Cast: NONE SEEN /LPF
Ketones, ur: NEGATIVE
Leukocytes,Ua: NEGATIVE
Nitrite: NEGATIVE
Protein, ur: NEGATIVE
Specific Gravity, Urine: 1.02 (ref 1.001–1.03)
WBC, UA: NONE SEEN /HPF (ref 0–5)
pH: 7 (ref 5.0–8.0)

## 2020-01-03 LAB — WET PREP FOR TRICH, YEAST, CLUE

## 2020-01-03 LAB — MICROSCOPIC MESSAGE

## 2020-01-03 MED ORDER — BUSPIRONE HCL 7.5 MG PO TABS: 7.5000 mg | ORAL_TABLET | Freq: Two times a day (BID) | ORAL | 1 refills | Status: DC

## 2020-01-03 NOTE — Progress Notes (Signed)
Subjective:    [name], female    DOB: 09/07/1970, 49 y.o.   MRN: [id number]  Patient presents for Urinary Frequency, Vaginal Discharge, and TMJ (L sided face pain)  Pt here with urinary frequency and vaginal discharge. These are recurrent concerns for her. Has seen GYN She has been treated for GYN on estrogen therapy, also has known rectocele Has white vaginal discharge, no itching but wants it checked, no skin rash in GU region  She has chronic TMJ pain, and sicca syndrome followed by dentist and rheumatology. She was seen by her rheumatologist who told her all her blood work is normal.  She has chronic pain in her TMJ region as well as that of her neck or shoulders.  She feels like it is anxiety related.  She keeps calling the specialist and I cannot find anything particular.  We discussed her anxiety multiple times in the past.  Discussed psychotherapy as well and she had declined in the past.  She is now willing to proceed with it.  She was prescribed BuSpar but she admits that she only took 1 pill and now cannot find a prescription but is willing to take this again as well.  Urinary frequency that started last month, no burning or pressure, no blood in urine    Review Of Systems:  GEN- denies fatigue, fever, weight loss,weakness, recent illness HEENT- denies eye drainage, change in vision, nasal discharge, CVS- denies chest pain, palpitations RESP- denies SOB, cough, wheeze ABD- denies N/V, change in stools, abd pain GU- denies dysuria, hematuria, dribbling, incontinence MSK- denies joint pain, muscle aches, injury Neuro- denies headache, dizziness, syncope, seizure activity       Objective:    BP 118/66   Pulse 68   Temp 98.6 F (37 C) (Temporal)   Resp 14   Ht 5\' 5"  (1.651 m)   Wt 156 lb (70.8 kg)   SpO2 99%   BMI 25.96 kg/m  GEN- NAD, alert and oriented x3 HEENT- PERRL, EOMI, non injected sclera, pink conjunctiva, MMM, oropharynx clear, TM  clear no effusion  Neck- Supple, no LAD  CVS- RRR, no murmur RESP-CTAB ABD-NABS,soft,NT,ND GU- normal external genitalia, vaginal mucosa pink and moist, s/p hysterectomy  minimal thin clear discharge,  no ovarian masses, uterus normal size Psych-tearful towards the end of the examination.  No suicidal ideation well-groomed good eye contact normal speech EXT- No edema Pulses- Radial  2+        Assessment & Plan:   Vaginal discharge, wet prep and GC done at pt request  treat pending results    Problem List Items Addressed This Visit      Unprioritized   Anxiety    Restart buspar change to 7.5mg  BID, to improve compliance Referral to psychiatry Her anxiety causes the muscular pain and contributes to her "IBS" She has flexeril at home she can use for her TMJ and neck discomfort       Relevant Medications   busPIRone (BUSPAR) 7.5 MG tablet   Other Relevant Orders   Ambulatory referral to Psychiatry   TMJ dysfunction   RESOLVED: Vaginal discharge   Relevant Orders   WET PREP FOR [place], YEAST, CLUE   C. trachomatis/N. gonorrhoeae RNA    Other Visit Diagnoses    Frequent urination    -  Primary   Send for urine culture, blood noted in urine   Relevant Orders   Urinalysis, Routine w reflex microscopic   Urine Culture  Note: This dictation was prepared with Dragon dictation along with smaller phrase technology. Any transcriptional errors that result from this process are unintentional.

## 2020-01-03 NOTE — Assessment & Plan Note (Signed)
Restart buspar change to 7.5mg  BID, to improve compliance Referral to psychiatry Her anxiety causes the muscular pain and contributes to her "IBS" She has flexeril at home she can use for her TMJ and neck discomfort

## 2020-01-03 NOTE — Patient Instructions (Addendum)
We will call with lab results Referral to psychiatry Restart buspar at 7.5mg  twice a day  Schedule physical

## 2020-01-04 LAB — URINE CULTURE
MICRO NUMBER:: 10786680
SPECIMEN QUALITY:: ADEQUATE

## 2020-01-04 LAB — C. TRACHOMATIS/N. GONORRHOEAE RNA
C. trachomatis RNA, TMA: NOT DETECTED
N. gonorrhoeae RNA, TMA: NOT DETECTED

## 2020-01-05 ENCOUNTER — Other Ambulatory Visit: Payer: Self-pay | Admitting: *Deleted

## 2020-01-05 MED ORDER — METRONIDAZOLE 500 MG PO TABS
500.0000 mg | ORAL_TABLET | Freq: Two times a day (BID) | ORAL | 0 refills | Status: AC
Start: 2020-01-05 — End: 2020-01-12

## 2020-01-17 DIAGNOSIS — R6884 Jaw pain: Secondary | ICD-10-CM | POA: Diagnosis not present

## 2020-03-08 ENCOUNTER — Other Ambulatory Visit: Payer: BC Managed Care – PPO | Admitting: Adult Health

## 2020-03-13 ENCOUNTER — Other Ambulatory Visit: Payer: Self-pay

## 2020-03-13 ENCOUNTER — Ambulatory Visit: Payer: BC Managed Care – PPO | Admitting: Family Medicine

## 2020-03-13 ENCOUNTER — Encounter: Payer: Self-pay | Admitting: Family Medicine

## 2020-03-13 VITALS — BP 104/68 | HR 60 | Temp 98.0°F | Resp 12 | Ht 65.0 in | Wt 156.0 lb

## 2020-03-13 DIAGNOSIS — K219 Gastro-esophageal reflux disease without esophagitis: Secondary | ICD-10-CM | POA: Diagnosis not present

## 2020-03-13 DIAGNOSIS — R202 Paresthesia of skin: Secondary | ICD-10-CM | POA: Diagnosis not present

## 2020-03-13 DIAGNOSIS — M3500 Sicca syndrome, unspecified: Secondary | ICD-10-CM

## 2020-03-13 DIAGNOSIS — F5104 Psychophysiologic insomnia: Secondary | ICD-10-CM

## 2020-03-13 DIAGNOSIS — R519 Headache, unspecified: Secondary | ICD-10-CM

## 2020-03-13 DIAGNOSIS — R6889 Other general symptoms and signs: Secondary | ICD-10-CM | POA: Diagnosis not present

## 2020-03-13 DIAGNOSIS — R42 Dizziness and giddiness: Secondary | ICD-10-CM

## 2020-03-13 DIAGNOSIS — F419 Anxiety disorder, unspecified: Secondary | ICD-10-CM

## 2020-03-13 MED ORDER — LORAZEPAM 1 MG PO TABS
1.0000 mg | ORAL_TABLET | Freq: Every day | ORAL | 1 refills | Status: DC
Start: 2020-03-13 — End: 2020-05-09

## 2020-03-13 MED ORDER — FAMOTIDINE 20 MG PO TABS
20.0000 mg | ORAL_TABLET | Freq: Every day | ORAL | 2 refills | Status: DC
Start: 2020-03-13 — End: 2020-04-08

## 2020-03-13 NOTE — Assessment & Plan Note (Signed)
Known GERD history, she is not on PPI Last saw GI a few years ago Start pepcid 20mg  once a day

## 2020-03-13 NOTE — Patient Instructions (Addendum)
CT Head to be done Buspar twice a day  Ativan at bedtime  Referral to neurology  Try the pepcid 20mg  once a day  F/U as previous

## 2020-03-13 NOTE — Progress Notes (Signed)
Subjective:    [name], female    DOB: 09/22/1970, 49 y.o.   MRN: [id number]  Patient presents for Insomnia (Pt said that she is having issues with sleeping, says that her mind races. Also says the left side of her body hurts all the time. She said that she has tingling and numbness in her hand and arm. Starts hurting at her head and it moves into her arm. She said that she would like to have labs done today as well. )  Pt here with multiple concerns, she has irritation in her stomach all the time She has gas, burning sensation in the middle of chest  She has numbness in hand, she also gets tingling and burning in feet  She has been having headache she feels weak on her left side is been going on worse the past few weeks. She is not sleeping very well feels like she can turn her mind off at night. She has not been taking the BuSpar regularly. She has underlying Sjrogens syndrome with TMJ and chronic problems with her teeth and face States the left side of her body just hurts  She is fatigued all the time  No recent illness    Review Of Systems:  GEN- denies fatigue, fever, weight loss,weakness, recent illness HEENT- denies eye drainage, change in vision, nasal discharge, CVS- denies chest pain, palpitations RESP- denies SOB, cough, wheeze ABD- denies N/V, change in stools, abd pain GU- denies dysuria, hematuria, dribbling, incontinence MSK- denies joint pain, muscle aches, injury Neuro- + headache, +dizziness, syncope, seizure activity       Objective:    BP 104/68    Pulse 60    Temp 98 F (36.7 C)    Resp 12    Ht 5\' 5"  (1.651 m)    Wt 156 lb (70.8 kg)    SpO2 98%    BMI 25.96 kg/m  GEN- NAD, alert and oriented x3 HEENT- PERRL, EOMI, non injected sclera, pink conjunctiva, MMM, oropharynx clear Neck- Supple, no thyromegaly CVS- RRR, no murmur RESP-CTAB ABD-NABS,soft,NT,ND NEURO CNII-XII in tact, no focal deficits Neg tinels phalens  sensation in tact,  normal tone UE, strength in tact Upper and lower ext  Psych anxious, not depressed, no SI  EXT- No edema Pulses- Radial, DP- 2+        Assessment & Plan:      Problem List Items Addressed This Visit      Unprioritized   Anxiety    Feel like a multitude of her symptoms are related to her anxiety which is untreated. She also has chronic insomnia and I believe the fatigue is coming from the poor sleep for the past few months. We will back it are previous visits we have discussed her anxiety and medications therapy before. We will give her lorazepam 1 mg to use before bedtime. Recommend she take the BuSpar.  She does have some risk factors for TIA/stroke in the setting of her hyperlipidemia and autoimmune disorder. None obtain a CT of head  she has been having headaches with dizzy spells and the left-sided symptoms though no focal deficit noted on today's examination. We will also refer her to neurology for further evaluation she may be nerve conduction studies performed.      Relevant Medications   LORazepam (ATIVAN) 1 MG tablet   Chronic insomnia   GERD (gastroesophageal reflux disease)    Known GERD history, she is not on PPI Last saw GI a  few years ago Start pepcid 20mg  once a day       Relevant Medications   famotidine (PEPCID) 20 MG tablet   Sjogren's syndrome ([place])    Other Visit Diagnoses    Intermittent paresthesia of hand and foot    -  Primary   Relevant Orders   CBC with Differential/Platelet   Comprehensive metabolic panel   Vitamin C34   CT Head Wo Contrast   Ambulatory referral to Neurology   Nonintractable episodic headache, unspecified headache type       Relevant Orders   CBC with Differential/Platelet   Comprehensive metabolic panel   CT Head Wo Contrast   Ambulatory referral to Neurology   Dizzy spells       Relevant Orders   CT Head Wo Contrast   Ambulatory referral to Neurology      Note: This dictation was prepared with Dragon dictation along  with smaller phrase technology. Any transcriptional errors that result from this process are unintentional.

## 2020-03-13 NOTE — Assessment & Plan Note (Signed)
Feel like a multitude of her symptoms are related to her anxiety which is untreated. She also has chronic insomnia and I believe the fatigue is coming from the poor sleep for the past few months. We will back it are previous visits we have discussed her anxiety and medications therapy before. We will give her lorazepam 1 mg to use before bedtime. Recommend she take the BuSpar.  She does have some risk factors for TIA/stroke in the setting of her hyperlipidemia and autoimmune disorder. None obtain a CT of head  she has been having headaches with dizzy spells and the left-sided symptoms though no focal deficit noted on today's examination. We will also refer her to neurology for further evaluation she may be nerve conduction studies performed.

## 2020-03-14 ENCOUNTER — Encounter: Payer: Self-pay | Admitting: Neurology

## 2020-03-14 LAB — CBC WITH DIFFERENTIAL/PLATELET
Absolute Monocytes: 407 cells/uL (ref 200–950)
Basophils Absolute: 80 cells/uL (ref 0–200)
Basophils Relative: 2.1 %
Eosinophils Absolute: 61 cells/uL (ref 15–500)
Eosinophils Relative: 1.6 %
HCT: 40.8 % (ref 35.0–45.0)
Hemoglobin: 13.6 g/dL (ref 11.7–15.5)
Lymphs Abs: 1885 cells/uL (ref 850–3900)
MCH: 31 pg (ref 27.0–33.0)
MCHC: 33.3 g/dL (ref 32.0–36.0)
MCV: 92.9 fL (ref 80.0–100.0)
MPV: 10.1 fL (ref 7.5–12.5)
Monocytes Relative: 10.7 %
Neutro Abs: 1368 cells/uL — ABNORMAL LOW (ref 1500–7800)
Neutrophils Relative %: 36 %
Platelets: 319 10*3/uL (ref 140–400)
RBC: 4.39 10*6/uL (ref 3.80–5.10)
RDW: 12.2 % (ref 11.0–15.0)
Total Lymphocyte: 49.6 %
WBC: 3.8 10*3/uL (ref 3.8–10.8)

## 2020-03-14 LAB — COMPREHENSIVE METABOLIC PANEL
AG Ratio: 1.3 (calc) (ref 1.0–2.5)
ALT: 16 U/L (ref 6–29)
AST: 25 U/L (ref 10–35)
Albumin: 4.1 g/dL (ref 3.6–5.1)
Alkaline phosphatase (APISO): 64 U/L (ref 31–125)
BUN: 12 mg/dL (ref 7–25)
CO2: 27 mmol/L (ref 20–32)
Calcium: 9.3 mg/dL (ref 8.6–10.2)
Chloride: 105 mmol/L (ref 98–110)
Creat: 0.8 mg/dL (ref 0.50–1.10)
Globulin: 3.1 g/dL (calc) (ref 1.9–3.7)
Glucose, Bld: 81 mg/dL (ref 65–99)
Potassium: 5.1 mmol/L (ref 3.5–5.3)
Sodium: 138 mmol/L (ref 135–146)
Total Bilirubin: 0.6 mg/dL (ref 0.2–1.2)
Total Protein: 7.2 g/dL (ref 6.1–8.1)

## 2020-03-14 LAB — VITAMIN B12: Vitamin B-12: 923 pg/mL (ref 200–1100)

## 2020-03-18 ENCOUNTER — Encounter: Payer: Self-pay | Admitting: *Deleted

## 2020-03-19 ENCOUNTER — Encounter (HOSPITAL_COMMUNITY): Payer: Self-pay

## 2020-03-19 ENCOUNTER — Other Ambulatory Visit: Payer: Self-pay

## 2020-03-19 ENCOUNTER — Emergency Department (HOSPITAL_COMMUNITY): Payer: BC Managed Care – PPO

## 2020-03-19 ENCOUNTER — Emergency Department (HOSPITAL_COMMUNITY)
Admission: EM | Admit: 2020-03-19 | Discharge: 2020-03-19 | Disposition: A | Discharge status: Home / Self Care | Payer: BC Managed Care – PPO | Attending: Emergency Medicine | Admitting: Emergency Medicine

## 2020-03-19 DIAGNOSIS — J45909 Unspecified asthma, uncomplicated: Secondary | ICD-10-CM | POA: Insufficient documentation

## 2020-03-19 DIAGNOSIS — M792 Neuralgia and neuritis, unspecified: Secondary | ICD-10-CM | POA: Diagnosis not present

## 2020-03-19 DIAGNOSIS — R519 Headache, unspecified: Secondary | ICD-10-CM | POA: Diagnosis not present

## 2020-03-19 DIAGNOSIS — M5412 Radiculopathy, cervical region: Secondary | ICD-10-CM | POA: Diagnosis not present

## 2020-03-19 DIAGNOSIS — M542 Cervicalgia: Secondary | ICD-10-CM | POA: Diagnosis not present

## 2020-03-19 DIAGNOSIS — M50122 Cervical disc disorder at C5-C6 level with radiculopathy: Secondary | ICD-10-CM | POA: Diagnosis not present

## 2020-03-19 DIAGNOSIS — M50121 Cervical disc disorder at C4-C5 level with radiculopathy: Secondary | ICD-10-CM | POA: Diagnosis not present

## 2020-03-19 MED ORDER — PREDNISONE 10 MG PO TABS
20.0000 mg | ORAL_TABLET | Freq: Every day | ORAL | 0 refills | Status: DC
Start: 2020-03-19 — End: 2020-05-09

## 2020-03-19 MED ORDER — IBUPROFEN 800 MG PO TABS
800.0000 mg | ORAL_TABLET | Freq: Once | ORAL | Status: AC
  Administered 2020-03-19: 800 mg via ORAL
  Filled 2020-03-19: qty 1

## 2020-03-19 MED ORDER — TRAMADOL HCL 50 MG PO TABS: 50.0000 mg | ORAL_TABLET | Freq: Four times a day (QID) | ORAL | 0 refills | Status: DC | PRN

## 2020-03-19 NOTE — Discharge Instructions (Addendum)
Follow-up with your doctor next week for recheck 

## 2020-03-19 NOTE — ED Triage Notes (Signed)
Pt c/o headache, back pain, and pain on the left side of her body for "weeks."  Pt says she saw her pcp and was told she needed a CT scan but says it hasn't been arranged.  Pt says pcp wanted to rule out MS.

## 2020-03-19 NOTE — ED Provider Notes (Signed)
[facility] Provider Note   CSN: [id number] Arrival date & time: 03/19/20  0900     History Chief Complaint  Patient presents with  . Headache    Yvette Mayer is a 49 y.o. female.  Patient complains of headache and neck pain.  That runs down her arm.  Patient complains of numbness to her left arm.  The history is provided by the patient and medical records. No language interpreter was used.  Headache Pain location:  Occipital Quality:  Dull Radiates to:  Does not radiate Severity currently:  3/10 Severity at highest:  6/10 Onset quality:  Sudden Timing:  Constant Progression:  Waxing and waning Chronicity:  New Context: not activity   Relieved by:  Nothing Worsened by:  Nothing Ineffective treatments:  None tried Associated symptoms: no abdominal pain, no back pain, no congestion, no cough, no diarrhea, no fatigue, no seizures and no sinus pressure        Past Medical History:  Diagnosis Date  . Abdominal pain 06/12/2014  . Allergy   . Anemia    iron 1 yr ago, normal hgb 7/12  . Anxiety   . Asthma    pt says no-bronchitis  . Bloated abdomen 10/15/2015  . Chronic back pain   . Chronic headaches   . Chronic neck pain   . Chronic sinusitis   . Current use of estrogen therapy 04/16/2015  . Dyspareunia 09/12/2014  . Elevated TSH 04/16/2015  . Fatigue 04/10/2015  . Fibromyalgia   . Gas 10/15/2015  . GERD (gastroesophageal reflux disease)   . Herpes simplex virus (HSV) infection   . History of kidney stones   . Hot flashes 04/10/2015  . Hyperlipidemia   . Kidney stone on right side 10/24/2015  . Mental disorder   . Ovarian cyst, right   . Paresthesias   . Shortness of breath   . Sinus drainage   . Sjoegren syndrome   . Symptoms, such as flushing, sleeplessness, headache, lack of concentration, associated with the menopause 04/10/2015  . TMJ (dislocation of temporomandibular joint)   . Vaginal discharge 08/08/2015  . Vertigo   . Yeast  infection 10/15/2015    Patient Active Problem List   Diagnosis Date Noted  . Menopause 04/06/2019  . Sleep disturbance 04/06/2019  . Current use of estrogen therapy 04/06/2019  . Carpal tunnel syndrome 03/06/2019  . Sensation of fullness in both ears 03/01/2019  . Abdominal pain 03/02/2018  . History of UTI 11/29/2017  . Dyspareunia, female 11/29/2017  . Seasonal and perennial allergic rhinitis 10/12/2017  . Adverse food reaction 10/12/2017  . ANA positive Speckled 1:320 titer ENA negative  11/20/2016  . Leukopenia 11/11/2016  . History of bruising easily 09/08/2016  . Borderline high cholesterol 09/08/2016  . Situational mixed anxiety and depressive disorder 08/24/2016  . Rectocele 07/16/2016  . Kidney stone on right side 10/24/2015  . Hot flashes 04/10/2015  . Symptoms, such as flushing, sleeplessness, headache, lack of concentration, associated with the menopause 04/10/2015  . 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 Constipation 12/26/2013  . Overweight (BMI 25.0-29.9) 12/26/2013  . Sjogren's syndrome ([place]) 08/09/2013  . Chronic insomnia 09/13/2012  . GERD (gastroesophageal reflux disease) 09/18/2011  . Anxiety 09/03/2011  . Hyperlipidemia 09/02/2011  . Fibromyalgia 09/02/2011  . Chronic nasal congestion 09/02/2011  . Vitamin D deficiency 09/02/2011  . Dysphagia 01/13/2011    Past Surgical History:  Procedure Laterality Date  . ABDOMINAL HYSTERECTOMY    . BIOPSY  09/09/2016  Procedure: BIOPSY;  Surgeon: Daneil Dolin, MD;  Location: [facility];  Service: Endoscopy;;  esophageal  . BREAST CYST EXCISION Right 1980   benign  . CESAREAN SECTION     x2  . CHOLECYSTECTOMY  2005   cholelithiasis  . COLONOSCOPY N/A 09/09/2016   Procedure: COLONOSCOPY;  Surgeon: Daneil Dolin, MD;  Location: [facility];  Service: Endoscopy;  Laterality: N/A;  8:30am  . ESOPHAGOGASTRODUODENOSCOPY  01/23/2011   MGN:[id number] undulating Z-line vs short segment Barrett s/p bx (NOT  BARRETT's)/small HH otherwise normal  . ESOPHAGOGASTRODUODENOSCOPY N/A 09/09/2016   Procedure: ESOPHAGOGASTRODUODENOSCOPY (EGD);  Surgeon: Daneil Dolin, MD;  [facility]: [facility];  Service: Endoscopy;  Laterality: N/A;  Venia Minks DILATION  01/23/2011   Procedure: Venia Minks DILATION;  Surgeon: Daneil Dolin, MD;  [facility];  Service: Endoscopy;  Laterality: N/A;  . Venia Minks DILATION N/A 09/09/2016   Procedure: Venia Minks DILATION;  Surgeon: Daneil Dolin, MD;  Location: [facility];  Service: Endoscopy;  Laterality: N/A;  . OVARIAN CYST REMOVAL  92 removal of cysts from behind ovaries  . PARTIAL HYSTERECTOMY  93  . SEPTOPLASTY     2011  . TEMPOROMANDIBULAR JOINT SURGERY    . TONSILLECTOMY     2010  . TURBINATE RESECTION Bilateral 05/29/2013   Procedure: TURBINATE RESECTION;  Surgeon: Ascencion Dike, MD;  Location: [facility];  Service: ENT;  Laterality: Bilateral;  . UPPER GASTROINTESTINAL ENDOSCOPY       OB History    Gravida  4   Para  3   Term  2   Preterm  1   AB  1   Living  3     SAB  1   TAB      Ectopic      Multiple      Live Births  3           Family History  Problem Relation Age of Onset  . Hypertension Mother   . Thyroid disease Sister        overactive  . Asthma Sister   . Eczema Sister   . Bronchitis Son   . Heart disease Maternal Grandmother   . Hypertension Maternal Grandmother   . Alcohol abuse Maternal Grandfather   . Scoliosis Son   . Other Maternal Aunt        Sjeogren syndrome  . Other Maternal Aunt        Sjoegren syndrome  . Other Maternal Aunt        Sjoegren syndrome  . Anesthesia problems Neg Hx   . Malignant hyperthermia Neg Hx   . Hypotension Neg Hx   . Pseudochol deficiency Neg Hx   . Colon cancer Neg Hx     Social History   Tobacco Use  . Smoking status: Never Smoker  . Smokeless tobacco: Never Used  Vaping Use  . Vaping Use: Never used  Substance Use Topics  . Alcohol use: No  .  Drug use: No    Home Medications Prior to Admission medications   Medication Sig Start Date End Date Taking? Authorizing Provider  busPIRone (BUSPAR) 7.5 MG tablet Take 1 tablet (7.5 mg total) by mouth 2 (two) times daily. Patient taking differently: Take 7.5 mg by mouth 2 (two) times daily. Takes "every now and then" 01/03/20  Yes [place], Modena Nunnery, MD  docusate sodium (COLACE) 100 MG capsule Take 100 mg by mouth daily as needed for mild  constipation.   Yes [provider]  estradiol (ESTRACE) 2 MG tablet TAKE 1 TABLET BY MOUTH EVERY DAY Patient taking differently: Take 2 mg by mouth daily.  12/29/19  Yes Derrek Monaco A, NP  famotidine (PEPCID) 20 MG tablet Take 1 tablet (20 mg total) by mouth daily. Patient taking differently: Take 20 mg by mouth daily. Will take when out of hospital 03/13/20  Yes [place], Modena Nunnery, MD  LORazepam (ATIVAN) 1 MG tablet Take 1 tablet (1 mg total) by mouth at bedtime. FOR SLEEP AS NEEDED 03/13/20  Yes [place], Modena Nunnery, MD  pantoprazole (PROTONIX) 40 MG tablet TAKE 1 TABLET BY MOUTH EVERY DAY 12/27/19  Yes Annitta Needs, NP  Wheat Dextrin (BENEFIBER ON THE GO) PACK Take by mouth daily. Takes 1 tbsp daily   Yes [provider]  White Petrolatum-Mineral Oil (ARTIFICIAL TEARS) ointment 1 drop as needed.   Yes [provider]  hydrocortisone (ANUSOL-HC) 25 MG suppository Place 1 suppository (25 mg total) rectally 2 (two) times daily as needed for hemorrhoids or anal itching. Patient not taking: Reported on 03/19/2020 10/13/19   Alycia Rossetti, MD  prednisoLONE acetate (PRED FORTE) 1 % ophthalmic suspension  11/01/19   [provider]  predniSONE (DELTASONE) 10 MG tablet Take 2 tablets (20 mg total) by mouth daily. 03/19/20   Milton Ferguson, MD  traMADol (ULTRAM) 50 MG tablet Take 1 tablet (50 mg total) by mouth every 6 (six) hours as needed. 03/19/20   Milton Ferguson, MD    Allergies    Hydrocodone-acetaminophen, Percocet  [oxycodone-acetaminophen], and Plaquenil [hydroxychloroquine sulfate]  Review of Systems   Review of Systems  Constitutional: Negative for appetite change and fatigue.  HENT: Negative for congestion, ear discharge and sinus pressure.   Eyes: Negative for discharge.  Respiratory: Negative for cough.   Cardiovascular: Negative for chest pain.  Gastrointestinal: Negative for abdominal pain and diarrhea.  Genitourinary: Negative for frequency and hematuria.  Musculoskeletal: Negative for back pain.  Skin: Negative for rash.  Neurological: Positive for headaches. Negative for seizures.       Pain numbness down left arm  Psychiatric/Behavioral: Negative for hallucinations.    Physical Exam Updated Vital Signs BP 104/70 (BP Location: Right Arm)   Pulse 64   Temp 97.6 F (36.4 C) (Oral)   Resp 16   Ht 5\' 5"  (1.651 m)   Wt 71.2 kg   SpO2 98%   BMI 26.13 kg/m   Physical Exam Vitals and nursing note reviewed.  Constitutional:      Appearance: She is well-developed.  HENT:     Head: Normocephalic.     Nose: Nose normal.  Eyes:     General: No scleral icterus.    Conjunctiva/sclera: Conjunctivae normal.  Neck:     Thyroid: No thyromegaly.  Cardiovascular:     Rate and Rhythm: Normal rate and regular rhythm.     Heart sounds: No murmur heard.  No friction rub. No gallop.   Pulmonary:     Breath sounds: No stridor. No wheezing or rales.  Chest:     Chest wall: No tenderness.  Abdominal:     General: There is no distension.     Tenderness: There is no abdominal tenderness. There is no rebound.  Musculoskeletal:        General: Normal range of motion.     Cervical back: Neck supple.  Lymphadenopathy:     Cervical: No cervical adenopathy.  Skin:    Findings: No erythema  or rash.  Neurological:     Mental Status: She is oriented to person, place, and time.     Motor: No abnormal muscle tone.     Coordination: Coordination normal.  Psychiatric:        Behavior: Behavior  normal.     ED Results / Procedures / Treatments   Labs (all labs ordered are listed, but only abnormal results are displayed) Labs Reviewed - No data to display  EKG None  Radiology CT Head Wo Contrast  Result Date: 03/19/2020 CLINICAL DATA:  Headache, cervical radiculopathy EXAM: CT HEAD WITHOUT CONTRAST CT CERVICAL SPINE WITHOUT CONTRAST TECHNIQUE: Multidetector CT imaging of the head and cervical spine was performed following the standard protocol without intravenous contrast. Multiplanar CT image reconstructions of the cervical spine were also generated. COMPARISON:  08/17/2016 FINDINGS: CT HEAD FINDINGS Brain: Normal anatomic configuration. No abnormal intra or extra-axial mass lesion or fluid collection. No abnormal mass effect or midline shift. No evidence of acute intracranial hemorrhage or infarct. Ventricular size is normal. Cerebellum unremarkable. Vascular: Unremarkable Skull: Intact Sinuses/Orbits: Paranasal sinuses are clear. Orbits are unremarkable. Other: Mastoid air cells and middle ear cavities are clear. CT CERVICAL SPINE FINDINGS Alignment: Mild reversal of the normal cervical lordosis may be positional in nature. No listhesis Skull base and vertebrae: The craniocervical junction is unremarkable. The atlantodental interval is normal. No acute fracture of the cervical spine. No lytic or blastic bone lesion is identified. Soft tissues and spinal canal: No prevertebral fluid or swelling. No visible canal hematoma. Disc levels: Review of the sagittal reformats demonstrates mild intervertebral disc space narrowing and endplate remodeling at T7-3 and C5-6 in keeping with mild degenerative disc disease. Vertebral body height and remaining intervertebral disc heights are preserved. The spinal canal appears widely patent. The prevertebral soft tissues are not thickened. Review of the axial images demonstrate its no significant uncovertebral or facet arthrosis. No significant spinal  stenosis or neural foraminal narrowing. Upper chest: Unremarkable Other: None signified IMPRESSION: No acute intracranial abnormality.  No calvarial fracture. No acute fracture of the cervical spine. Mild mid cervical degenerative disc disease. Electronically Signed   By: Fidela Salisbury MD   On: 03/19/2020 10:36   CT Cervical Spine Wo Contrast  Result Date: 03/19/2020 CLINICAL DATA:  Headache, cervical radiculopathy EXAM: CT HEAD WITHOUT CONTRAST CT CERVICAL SPINE WITHOUT CONTRAST TECHNIQUE: Multidetector CT imaging of the head and cervical spine was performed following the standard protocol without intravenous contrast. Multiplanar CT image reconstructions of the cervical spine were also generated. COMPARISON:  08/17/2016 FINDINGS: CT HEAD FINDINGS Brain: Normal anatomic configuration. No abnormal intra or extra-axial mass lesion or fluid collection. No abnormal mass effect or midline shift. No evidence of acute intracranial hemorrhage or infarct. Ventricular size is normal. Cerebellum unremarkable. Vascular: Unremarkable Skull: Intact Sinuses/Orbits: Paranasal sinuses are clear. Orbits are unremarkable. Other: Mastoid air cells and middle ear cavities are clear. CT CERVICAL SPINE FINDINGS Alignment: Mild reversal of the normal cervical lordosis may be positional in nature. No listhesis Skull base and vertebrae: The craniocervical junction is unremarkable. The atlantodental interval is normal. No acute fracture of the cervical spine. No lytic or blastic bone lesion is identified. Soft tissues and spinal canal: No prevertebral fluid or swelling. No visible canal hematoma. Disc levels: Review of the sagittal reformats demonstrates mild intervertebral disc space narrowing and endplate remodeling at U2-0 and C5-6 in keeping with mild degenerative disc disease. Vertebral body height and remaining intervertebral disc heights are preserved. The spinal canal  appears widely patent. The prevertebral soft tissues are not  thickened. Review of the axial images demonstrate its no significant uncovertebral or facet arthrosis. No significant spinal stenosis or neural foraminal narrowing. Upper chest: Unremarkable Other: None signified IMPRESSION: No acute intracranial abnormality.  No calvarial fracture. No acute fracture of the cervical spine. Mild mid cervical degenerative disc disease. Electronically Signed   By: Fidela Salisbury MD   On: 03/19/2020 10:36    Procedures Procedures (including critical care time)  Medications Ordered in ED Medications  ibuprofen (ADVIL) tablet 800 mg (800 mg Oral Given 03/19/20 1025)    ED Course  I have reviewed the triage vital signs and the nursing notes.  Pertinent labs & imaging results that were available during my care of the patient were reviewed by me and considered in my medical decision making (see chart for details).    MDM Rules/Calculators/A&P                          CT head cervical spine negative.  Patient is treated for cervical neuritis with prednisone and tramadol and will follow up with PCP. Final Clinical Impression(s) / ED Diagnoses Final diagnoses:  Cervical neuritis    Rx / DC Orders ED Discharge Orders         Ordered    predniSONE (DELTASONE) 10 MG tablet  Daily        03/19/20 1157    traMADol (ULTRAM) 50 MG tablet  Every 6 hours PRN        03/19/20 1157           Milton Ferguson, MD 03/22/20 [phone]

## 2020-03-23 ENCOUNTER — Other Ambulatory Visit: Payer: Self-pay | Admitting: Gastroenterology

## 2020-03-29 ENCOUNTER — Ambulatory Visit: Payer: BC Managed Care – PPO | Admitting: Internal Medicine

## 2020-04-05 ENCOUNTER — Ambulatory Visit (HOSPITAL_COMMUNITY): Payer: BC Managed Care – PPO

## 2020-04-07 ENCOUNTER — Other Ambulatory Visit: Payer: Self-pay | Admitting: Family Medicine

## 2020-04-10 ENCOUNTER — Ambulatory Visit: Payer: BC Managed Care – PPO | Admitting: Neurology

## 2020-04-16 ENCOUNTER — Other Ambulatory Visit: Payer: BC Managed Care – PPO | Admitting: Adult Health

## 2020-04-19 ENCOUNTER — Other Ambulatory Visit: Payer: BC Managed Care – PPO | Admitting: Family Medicine

## 2020-05-01 ENCOUNTER — Other Ambulatory Visit: Payer: BC Managed Care – PPO | Admitting: Adult Health

## 2020-05-09 ENCOUNTER — Other Ambulatory Visit: Payer: Self-pay

## 2020-05-09 ENCOUNTER — Ambulatory Visit (INDEPENDENT_AMBULATORY_CARE_PROVIDER_SITE_OTHER): Payer: BC Managed Care – PPO | Admitting: Adult Health

## 2020-05-09 ENCOUNTER — Encounter: Payer: Self-pay | Admitting: Adult Health

## 2020-05-09 VITALS — BP 104/64 | HR 69 | Ht 65.0 in | Wt 162.0 lb

## 2020-05-09 DIAGNOSIS — Z79899 Other long term (current) drug therapy: Secondary | ICD-10-CM | POA: Diagnosis not present

## 2020-05-09 DIAGNOSIS — Z01419 Encounter for gynecological examination (general) (routine) without abnormal findings: Secondary | ICD-10-CM

## 2020-05-09 DIAGNOSIS — K582 Mixed irritable bowel syndrome: Secondary | ICD-10-CM | POA: Insufficient documentation

## 2020-05-09 DIAGNOSIS — Z1211 Encounter for screening for malignant neoplasm of colon: Secondary | ICD-10-CM | POA: Diagnosis not present

## 2020-05-09 DIAGNOSIS — F419 Anxiety disorder, unspecified: Secondary | ICD-10-CM

## 2020-05-09 DIAGNOSIS — N816 Rectocele: Secondary | ICD-10-CM

## 2020-05-09 LAB — HEMOCCULT GUIAC POC 1CARD (OFFICE): Fecal Occult Blood, POC: NEGATIVE

## 2020-05-09 NOTE — Progress Notes (Signed)
[name], female   DOB: Oct 28, 1970, 49 y.o.   MRN: [id number] History of Present Illness: Yvette Mayer is a 49 year old black female, divorced, sp hysterectomy in for a well woman gyn exam. PCP is Dr Buelah Manis.   Current Medications, Allergies, Past Medical History, Past Surgical History, Family History and Social History were reviewed in [facility] record.     Review of Systems:  Patient denies any headaches, hearing loss, fatigue, blurred vision, shortness of breath, chest pain, problems with  urination, or intercourse(not active). No joint pain or mood swings. She is stressed and stomach hurts and having constipation She is of buspar but will restart   Physical Exam:BP 104/64 (BP Location: Left Arm, Patient Position: Sitting, Cuff Size: Normal)   Pulse 69   Ht 5\' 5"  (1.651 m)   Wt 162 lb (73.5 kg)   BMI 26.96 kg/m  General:  Well developed, well nourished, no acute distress Skin:  Warm and dry Neck:  Midline trachea, normal thyroid, good ROM, no lymphadenopathy Lungs; Clear to auscultation bilaterally Breast:  No dominant palpable mass, retraction, or nipple discharge Cardiovascular: Regular rate and rhythm Abdomen:  Soft, non tender, no hepatosplenomegaly Pelvic:  External genitalia is normal in appearance, no lesions.  The vagina is normal in appearance. Urethra has no lesions or masses. The cervix and uterus are absent,no lesions. No adnexal masses or tenderness noted.Bladder is non tender, no masses felt. Rectal: Good sphincter tone, no polyps, or hemorrhoids felt.  Hemoccult negative.+rectocele Extremities/musculoskeletal:  No swelling or varicosities noted, no clubbing or cyanosis Psych:  No mood changes, alert and cooperative,seems happy AA is 0  Fall risk is low PHQ 9 score is 10, no SI and GAD 7 is 14 will start taking Buspar again, has at home   [facility] - 05/09/20 1037      Pregnancy Intention Screening   Does the patient want to become  pregnant in the next year? N/A    Does the patient's partner want to become pregnant in the next year? N/A    Would the patient like to discuss contraceptive options today? N/A      Contraception Wrap Up   Current Method No Method - Other Reason   hyst   End Method No Method - Other Reason   hyst   Contraception Counseling Provided No         Examination chaperoned by Yvette Bamberg LPN  Impression and Plan: 1. Encounter for well woman exam with routine gynecological exam Physical in 1 year Labs with PCP Mammogram yearly Colonoscopy per GI  2. Encounter for screening fecal occult blood testing   3. Anxiety Start taking buspar again Take time for self  4. Current use of estrogen therapy Continue estrace 2 mg has refills   5. Rectocele   6. Irritable bowel syndrome with both constipation and diarrhea Has appointment with GI 05/21/20.

## 2020-05-21 ENCOUNTER — Ambulatory Visit: Payer: BC Managed Care – PPO | Admitting: Internal Medicine

## 2020-05-30 ENCOUNTER — Other Ambulatory Visit: Payer: BC Managed Care – PPO

## 2020-05-30 DIAGNOSIS — Z20822 Contact with and (suspected) exposure to covid-19: Secondary | ICD-10-CM | POA: Diagnosis not present

## 2020-06-03 ENCOUNTER — Ambulatory Visit: Payer: BC Managed Care – PPO | Admitting: Neurology

## 2020-06-04 LAB — NOVEL CORONAVIRUS, NAA

## 2020-06-05 ENCOUNTER — Other Ambulatory Visit: Payer: Self-pay

## 2020-06-05 MED ORDER — CYCLOBENZAPRINE HCL 5 MG PO TABS: 5.0000 mg | ORAL_TABLET | Freq: Every evening | ORAL | 1 refills | Status: DC | PRN

## 2020-06-05 NOTE — Telephone Encounter (Signed)
No muscle relaxer noted on patient medication list.   Call placed to patient. Reports that she is requesting refill on Flexeril for jaw pain.   Please advise.

## 2020-06-05 NOTE — Telephone Encounter (Signed)
Needs med refill muscle relaxer   [facility] [place]

## 2020-06-05 NOTE — Telephone Encounter (Signed)
FYI

## 2020-06-18 ENCOUNTER — Emergency Department (HOSPITAL_COMMUNITY): Payer: BC Managed Care – PPO

## 2020-06-18 ENCOUNTER — Other Ambulatory Visit: Payer: Self-pay

## 2020-06-18 ENCOUNTER — Encounter (HOSPITAL_COMMUNITY): Payer: Self-pay | Admitting: Emergency Medicine

## 2020-06-18 ENCOUNTER — Emergency Department (HOSPITAL_COMMUNITY)
Admission: EM | Admit: 2020-06-18 | Discharge: 2020-06-18 | Disposition: A | Discharge status: Home / Self Care | Payer: BC Managed Care – PPO | Attending: Emergency Medicine | Admitting: Emergency Medicine

## 2020-06-18 DIAGNOSIS — E039 Hypothyroidism, unspecified: Secondary | ICD-10-CM | POA: Diagnosis not present

## 2020-06-18 DIAGNOSIS — R059 Cough, unspecified: Secondary | ICD-10-CM | POA: Insufficient documentation

## 2020-06-18 DIAGNOSIS — F419 Anxiety disorder, unspecified: Secondary | ICD-10-CM | POA: Diagnosis not present

## 2020-06-18 DIAGNOSIS — R079 Chest pain, unspecified: Secondary | ICD-10-CM | POA: Diagnosis not present

## 2020-06-18 DIAGNOSIS — G8929 Other chronic pain: Secondary | ICD-10-CM | POA: Diagnosis not present

## 2020-06-18 DIAGNOSIS — J45909 Unspecified asthma, uncomplicated: Secondary | ICD-10-CM | POA: Insufficient documentation

## 2020-06-18 DIAGNOSIS — R0789 Other chest pain: Secondary | ICD-10-CM | POA: Diagnosis not present

## 2020-06-18 DIAGNOSIS — R1084 Generalized abdominal pain: Secondary | ICD-10-CM | POA: Diagnosis not present

## 2020-06-18 LAB — CBC WITH DIFFERENTIAL/PLATELET
Abs Immature Granulocytes: 0.01 10*3/uL (ref 0.00–0.07)
Basophils Absolute: 0.1 10*3/uL (ref 0.0–0.1)
Basophils Relative: 2 %
Eosinophils Absolute: 0.1 10*3/uL (ref 0.0–0.5)
Eosinophils Relative: 4 %
HCT: 38.7 % (ref 36.0–46.0)
Hemoglobin: 12.7 g/dL (ref 12.0–15.0)
Immature Granulocytes: 0 %
Lymphocytes Relative: 42 %
Lymphs Abs: 1.4 10*3/uL (ref 0.7–4.0)
MCH: 30.9 pg (ref 26.0–34.0)
MCHC: 32.8 g/dL (ref 30.0–36.0)
MCV: 94.2 fL (ref 80.0–100.0)
Monocytes Absolute: 0.4 10*3/uL (ref 0.1–1.0)
Monocytes Relative: 11 %
Neutro Abs: 1.4 10*3/uL — ABNORMAL LOW (ref 1.7–7.7)
Neutrophils Relative %: 41 %
Platelets: 304 10*3/uL (ref 150–400)
RBC: 4.11 MIL/uL (ref 3.87–5.11)
RDW: 12.8 % (ref 11.5–15.5)
WBC: 3.3 10*3/uL — ABNORMAL LOW (ref 4.0–10.5)
nRBC: 0 % (ref 0.0–0.2)

## 2020-06-18 LAB — COMPREHENSIVE METABOLIC PANEL
ALT: 18 U/L (ref 0–44)
AST: 20 U/L (ref 15–41)
Albumin: 3.4 g/dL — ABNORMAL LOW (ref 3.5–5.0)
Alkaline Phosphatase: 52 U/L (ref 38–126)
Anion gap: 7 (ref 5–15)
BUN: 10 mg/dL (ref 6–20)
CO2: 24 mmol/L (ref 22–32)
Calcium: 8.6 mg/dL — ABNORMAL LOW (ref 8.9–10.3)
Chloride: 105 mmol/L (ref 98–111)
Creatinine, Ser: 0.82 mg/dL (ref 0.44–1.00)
GFR, Estimated: >60 mL/min (ref >60)
Glucose, Bld: 90 mg/dL (ref 70–99)
Potassium: 3.4 mmol/L — ABNORMAL LOW (ref 3.5–5.1)
Sodium: 136 mmol/L (ref 135–145)
Total Bilirubin: 0.4 mg/dL (ref 0.3–1.2)
Total Protein: 6.8 g/dL (ref 6.5–8.1)

## 2020-06-18 LAB — LIPASE, BLOOD: Lipase: 27 U/L (ref 11–51)

## 2020-06-18 LAB — D-DIMER, QUANTITATIVE: D-Dimer, Quant: <0.27 ug/mL-FEU (ref 0.00–0.50)

## 2020-06-18 LAB — TROPONIN I (HIGH SENSITIVITY): Troponin I (High Sensitivity): 2 ng/L (ref <18)

## 2020-06-18 MED ORDER — HYDROXYZINE HCL 25 MG PO TABS
25.0000 mg | ORAL_TABLET | Freq: Once | ORAL | Status: AC
  Administered 2020-06-18: 25 mg via ORAL
  Filled 2020-06-18: qty 1

## 2020-06-18 MED ORDER — METHOCARBAMOL 500 MG PO TABS
500.0000 mg | ORAL_TABLET | Freq: Once | ORAL | Status: AC
  Administered 2020-06-18: 500 mg via ORAL
  Filled 2020-06-18: qty 1

## 2020-06-18 MED ORDER — KETOROLAC TROMETHAMINE 30 MG/ML IJ SOLN
30.0000 mg | Freq: Once | INTRAMUSCULAR | Status: AC
  Administered 2020-06-18: 30 mg via INTRAVENOUS
  Filled 2020-06-18: qty 1

## 2020-06-18 MED ORDER — SODIUM CHLORIDE 0.9 % IV BOLUS
1000.0000 mL | Freq: Once | INTRAVENOUS | Status: AC
  Administered 2020-06-18: 1000 mL via INTRAVENOUS

## 2020-06-18 NOTE — ED Triage Notes (Signed)
Pt c/o of abdominal pain, chest pain and left sided body pain x 1 year.

## 2020-06-18 NOTE — ED Provider Notes (Signed)
[facility] Provider Note   CSN: [id number] Arrival date & time: 06/18/20  0857     History Chief Complaint  Patient presents with  . multiple complaints    Yvette Mayer is a 50 y.o. female.  Yvette Mayer is a 50 y.o. female with a history of fibromyalgia, chronic pain, anemia, hyperlipidemia, GERD, hypothyroidism, Sjogren's disease, TMJ, who presents to the emergency department for evaluation of chest pain, abdominal pain and left-sided body pain.  Patient reports she has been experiencing the symptoms for the past year.  She reports they seem to get worse and her anxiety worsens as well.  She reports that she has been having trouble sleeping recently and last night noticed worsening pain in the left side of her chest she reports feels like a constant aching that radiates throughout the whole left side of her body.  Reports that last night she felt like the pain was worse with breathing which scared her.  She has had this pain before but felt that last night it worsened and thought something could be wrong.  Reports an occasional cough that is nonproductive, no fevers or chills.  No lightheadedness or syncope.  Patient is on daily estrogen therapy.  No known history of PE or DVT.  Patient is not on any blood thinners.  She also reports some upper abdominal pain and tightness.  She denies any associated nausea, vomiting or diarrhea.  Reports she was experiencing some constipation but after drinking some water, walking more and taking laxatives this is improved.        Past Medical History:  Diagnosis Date  . Abdominal pain 06/12/2014  . Allergy   . Anemia    iron 1 yr ago, normal hgb 7/12  . Anxiety   . Asthma    pt says no-bronchitis  . Bloated abdomen 10/15/2015  . Chronic back pain   . Chronic headaches   . Chronic neck pain   . Chronic sinusitis   . Current use of estrogen therapy 04/16/2015  . Dyspareunia 09/12/2014  . Elevated TSH 04/16/2015  .  Fatigue 04/10/2015  . Fibromyalgia   . Gas 10/15/2015  . GERD (gastroesophageal reflux disease)   . Herpes simplex virus (HSV) infection   . History of kidney stones   . Hot flashes 04/10/2015  . Hyperlipidemia   . Kidney stone on right side 10/24/2015  . Mental disorder   . Ovarian cyst, right   . Paresthesias   . Shortness of breath   . Sinus drainage   . Sjoegren syndrome   . Symptoms, such as flushing, sleeplessness, headache, lack of concentration, associated with the menopause 04/10/2015  . TMJ (dislocation of temporomandibular joint)   . Vaginal discharge 08/08/2015  . Vertigo   . Yeast infection 10/15/2015    Patient Active Problem List   Diagnosis Date Noted  . Encounter for screening fecal occult blood testing 05/09/2020  . Encounter for well woman exam with routine gynecological exam 05/09/2020  . Irritable bowel syndrome with both constipation and diarrhea 05/09/2020  . Menopause 04/06/2019  . Sleep disturbance 04/06/2019  . Current use of estrogen therapy 04/06/2019  . Carpal tunnel syndrome 03/06/2019  . Sensation of fullness in both ears 03/01/2019  . Abdominal pain 03/02/2018  . History of UTI 11/29/2017  . Dyspareunia, female 11/29/2017  . Seasonal and perennial allergic rhinitis 10/12/2017  . Adverse food reaction 10/12/2017  . ANA positive Speckled 1:320 titer ENA negative  11/20/2016  .  Leukopenia 11/11/2016  . History of bruising easily 09/08/2016  . Borderline high cholesterol 09/08/2016  . Situational mixed anxiety and depressive disorder 08/24/2016  . Rectocele 07/16/2016  . Kidney stone on right side 10/24/2015  . Hot flashes 04/10/2015  . Symptoms, such as flushing, sleeplessness, headache, lack of concentration, associated with the menopause 04/10/2015  . 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 Constipation 12/26/2013  . Overweight (BMI 25.0-29.9) 12/26/2013  . Sjogren's syndrome ([place]) 08/09/2013  . Chronic insomnia 09/13/2012  . GERD (gastroesophageal  reflux disease) 09/18/2011  . Anxiety 09/03/2011  . Hyperlipidemia 09/02/2011  . Fibromyalgia 09/02/2011  . Chronic nasal congestion 09/02/2011  . Vitamin D deficiency 09/02/2011  . Dysphagia 01/13/2011    Past Surgical History:  Procedure Laterality Date  . ABDOMINAL HYSTERECTOMY    . BIOPSY  09/09/2016   Procedure: BIOPSY;  Surgeon: Daneil Dolin, MD;  [facility];  Service: Endoscopy;;  esophageal  . BREAST CYST EXCISION Right 1980   benign  . CESAREAN SECTION     x2  . CHOLECYSTECTOMY  2005   cholelithiasis  . COLONOSCOPY N/A 09/09/2016   Procedure: COLONOSCOPY;  Surgeon: Daneil Dolin, MD;  Location: [facility];  Service: Endoscopy;  Laterality: N/A;  8:30am  . ESOPHAGOGASTRODUODENOSCOPY  01/23/2011   [id number] undulating Z-line vs short segment Barrett s/p bx (NOT BARRETT's)/small HH otherwise normal  . ESOPHAGOGASTRODUODENOSCOPY N/A 09/09/2016   Procedure: ESOPHAGOGASTRODUODENOSCOPY (EGD);  Surgeon: Daneil Dolin, MD;  Location: [facility];  Service: Endoscopy;  Laterality: N/A;  Venia Minks DILATION  01/23/2011   Procedure: Venia Minks DILATION;  Surgeon: Daneil Dolin, MD;  [facility]: [facility];  Service: Endoscopy;  Laterality: N/A;  . Venia Minks DILATION N/A 09/09/2016   Procedure: Venia Minks DILATION;  Surgeon: Daneil Dolin, MD;  Location: [facility];  Service: Endoscopy;  Laterality: N/A;  . OVARIAN CYST REMOVAL  92 removal of cysts from behind ovaries  . PARTIAL HYSTERECTOMY  93  . SEPTOPLASTY     2011  . TEMPOROMANDIBULAR JOINT SURGERY    . TONSILLECTOMY     2010  . TURBINATE RESECTION Bilateral 05/29/2013   Procedure: TURBINATE RESECTION;  Surgeon: Ascencion Dike, MD;  Location: [facility];  Service: ENT;  Laterality: Bilateral;  . UPPER GASTROINTESTINAL ENDOSCOPY       OB History    Gravida  4   Para  3   Term  2   Preterm  1   AB  1   Living  3     SAB  1   IAB      Ectopic      Multiple      Live Births   3           Family History  Problem Relation Age of Onset  . Hypertension Mother   . Thyroid disease Sister        overactive  . Asthma Sister   . Eczema Sister   . Bronchitis Son   . Heart disease Maternal Grandmother   . Hypertension Maternal Grandmother   . Alcohol abuse Maternal Grandfather   . Scoliosis Son   . Other Maternal Aunt        Sjeogren syndrome  . Other Maternal Aunt        Sjoegren syndrome  . Other Maternal Aunt        Sjoegren syndrome  . Anesthesia problems Neg Hx   . Malignant hyperthermia Neg Hx   .  Hypotension Neg Hx   . Pseudochol deficiency Neg Hx   . Colon cancer Neg Hx     Social History   Tobacco Use  . Smoking status: Never Smoker  . Smokeless tobacco: Never Used  Vaping Use  . Vaping Use: Never used  Substance Use Topics  . Alcohol use: No  . Drug use: No    Home Medications Prior to Admission medications   Medication Sig Start Date End Date Taking? Authorizing Provider  busPIRone (BUSPAR) 7.5 MG tablet Take 7.5 mg by mouth 3 (three) times daily.    [provider]  cyclobenzaprine (FLEXERIL) 5 MG tablet Take 1 tablet (5 mg total) by mouth at bedtime as needed for muscle spasms. 06/05/20   [place], Modena Nunnery, MD  estradiol (ESTRACE) 2 MG tablet TAKE 1 TABLET BY MOUTH EVERY DAY Patient taking differently: Take 2 mg by mouth daily. 12/29/19   Estill Dooms, NP  famotidine (PEPCID) 20 MG tablet Take 1 tablet (20 mg total) by mouth daily. Will take when out of hospital 04/08/20   Alycia Rossetti, MD  omeprazole (PRILOSEC) 20 MG capsule Take 1 capsule (20 mg total) by mouth daily before breakfast. 03/26/20   Mahala Menghini, PA-C  Wheat Dextrin (BENEFIBER ON THE GO) PACK Take by mouth daily. Takes 1 tbsp daily    [provider]    Allergies    Hydrocodone-acetaminophen, Percocet [oxycodone-acetaminophen], and Plaquenil [hydroxychloroquine sulfate]  Review of Systems   Review of Systems  Constitutional:  Negative for chills and fever.  HENT: Negative.   Eyes: Negative for visual disturbance.  Respiratory: Negative for cough and shortness of breath.   Cardiovascular: Positive for chest pain.  Gastrointestinal: Positive for abdominal pain. Negative for constipation, diarrhea, nausea and vomiting.  Genitourinary: Negative for dysuria and frequency.  Musculoskeletal: Positive for myalgias.  Skin: Negative for color change and rash.  Neurological: Positive for headaches. Negative for dizziness, syncope, weakness, light-headedness and numbness.  All other systems reviewed and are negative.   Physical Exam Updated Vital Signs BP 128/73   Pulse (!) 53   Temp 98.2 F (36.8 C) (Oral)   Resp 16   Ht 5\' 5"  (1.651 m)   Wt 74.8 kg   SpO2 100%   BMI 27.46 kg/m   Physical Exam Vitals and nursing note reviewed.  Constitutional:      General: She is not in acute distress.    Appearance: Normal appearance. She is well-developed and well-nourished. She is not ill-appearing or diaphoretic.     Comments: Patient appears anxious but is in no acute distress.  HENT:     Head: Normocephalic and atraumatic.     Mouth/Throat:     Mouth: Oropharynx is clear and moist. Mucous membranes are moist.     Pharynx: Oropharynx is clear.  Eyes:     General:        Right eye: No discharge.        Left eye: No discharge.     Extraocular Movements: Extraocular movements intact and EOM normal.     Pupils: Pupils are equal, round, and reactive to light.  Cardiovascular:     Rate and Rhythm: Normal rate and regular rhythm.     Pulses: Normal pulses and intact distal pulses.     Heart sounds: Normal heart sounds. No murmur heard. No friction rub. No gallop.   Pulmonary:     Effort: Pulmonary effort is normal. No respiratory distress.     Breath sounds:  Normal breath sounds. No wheezing or rales.     Comments: Respirations equal and unlabored, patient able to speak in full sentences, lungs clear to auscultation  bilaterally, left-sided chest pain reproducible with palpation, no overlying skin changes or palpable deformity. Abdominal:     General: Bowel sounds are normal. There is no distension.     Palpations: Abdomen is soft. There is no mass.     Tenderness: There is no abdominal tenderness. There is no guarding.     Comments: Abdomen is soft, nondistended, bowel sounds present throughout, patient endorses mild generalized tenderness to palpation, but pain does not localize to any 1 area and patient has no guarding or peritoneal signs, with distraction, patient does not complain of any pain, no CVA tenderness bilaterally.  Musculoskeletal:        General: No deformity or edema.     Cervical back: Neck supple. No rigidity.     Right lower leg: No edema.     Left lower leg: No edema.     Comments: Moving all extremities without difficulty, no deformity or swelling, all compartments soft  Skin:    General: Skin is warm and dry.     Capillary Refill: Capillary refill takes less than 2 seconds.  Neurological:     Mental Status: She is alert and oriented to person, place, and time.     Coordination: Coordination normal.     Comments: Speech is clear, able to follow commands CN III-XII intact Normal strength in upper and lower extremities bilaterally including dorsiflexion and plantar flexion, strong and equal grip strength Sensation normal to light and sharp touch Moves extremities without ataxia, coordination intact  Psychiatric:        Mood and Affect: Mood normal.        Behavior: Behavior normal.     ED Results / Procedures / Treatments   Labs (all labs ordered are listed, but only abnormal results are displayed) Labs Reviewed  COMPREHENSIVE METABOLIC PANEL - Abnormal; Notable for the following components:      Result Value   Potassium 3.4 (*)    Calcium 8.6 (*)    Albumin 3.4 (*)    All other components within normal limits  CBC WITH DIFFERENTIAL/PLATELET - Abnormal; Notable for the  following components:   WBC 3.3 (*)    Neutro Abs 1.4 (*)    All other components within normal limits  LIPASE, BLOOD  D-DIMER, QUANTITATIVE (NOT AT [facility])  TROPONIN I (HIGH SENSITIVITY)    EKG None  Radiology DG Chest 1 View  Result Date: 06/18/2020 CLINICAL DATA:  cp EXAM: CHEST  1 VIEW COMPARISON:  04/03/2015 and prior. FINDINGS: No focal consolidation. No pneumothorax or pleural effusion. Cardiomediastinal silhouette is within normal limits. No acute osseous abnormality. IMPRESSION: No focal airspace disease. Electronically Signed   By: Primitivo Gauze M.D.   On: 06/18/2020 10:04    Procedures Procedures (including critical care time)  Medications Ordered in ED Medications  sodium chloride 0.9 % bolus 1,000 mL (0 mLs Intravenous Stopped 06/18/20 1122)  methocarbamol (ROBAXIN) tablet 500 mg (500 mg Oral Given 06/18/20 1013)  ketorolac (TORADOL) 30 MG/ML injection 30 mg (30 mg Intravenous Given 06/18/20 1107)  hydrOXYzine (ATARAX/VISTARIL) tablet 25 mg (25 mg Oral Given 06/18/20 1107)    ED Course  I have reviewed the triage vital signs and the nursing notes.  Pertinent labs & imaging results that were available during my care of the patient were reviewed by me and considered in  my medical decision making (see chart for details).    MDM Rules/Calculators/A&P                         Patient presents to the ED with complaints of chest pain, abdominal pain and pain over the left side of her body.  The symptoms have been ongoing for the past year, chest pain seemed to worsen last night when she could not sleep which concerned her.  Chest pain was somewhat pleuritic when it began.  Patient nontoxic appearing, in no apparent distress, vitals WNL. On exam patient tender to palpation over the left chest, some generalized tenderness noted throughout the abdomen, patient also with palpable muscle spasm across the shoulders, no peritoneal signs over the abdomen. Will evaluate with basic  labs, lipase, troponin, D-dimer, EKG and chest x-ray.  Will give IV fluids, Toradol, Robaxin and hydroxyzine.  Patient does report that she is very anxious and feels that this is worsening her symptoms.  Stressed the importance today to the patient that we are going to rule out emergent or life-threatening conditions, but given that the symptoms have been ongoing over the past year she will need to continue to follow closely with her PCP for this.  I have independently ordered, reviewed and interpreted all labs and imaging: CBC: Mild leukopenia of 3.3, normal hemoglobin and platelets CMP: Potassium of 3.4 but no other significant electrolyte derangements, normal renal and liver function Lipase: WNL Troponin: Negative, given symptoms have been ongoing over the past year do not feel that delta troponin is indicated D-dimer: Negative, no concern for PE  EKG: Sinus rhythm with no concerning changes  Chest x-ray is clear with no focal airspace disease or other active cardiopulmonary disease.  Overall patient's work-up has been very reassuring, discussed this with her and provided reassurance.  I do suspect anxiety is contributing she has known history of chronic pain and fibromyalgia as well.  Her symptoms are improved here in the emergency department, encouraged her to continue to treat symptoms supportively at home and follow closely with her PCP for further evaluation and management of the symptoms and her anxiety.  At this time there does not appear to be any evidence of an acute emergency medical condition and the patient appears stable for discharge with appropriate outpatient follow up.Diagnosis was discussed with patient who verbalizes understanding and is agreeable to discharge.    Final Clinical Impression(s) / ED Diagnoses Final diagnoses:  Left-sided chest pain  Generalized abdominal pain  Other chronic pain  Anxiety    Rx / DC Orders ED Discharge Orders    None       Janet Berlin 06/18/20 1133    Davonna Belling, MD 06/18/20 1524

## 2020-06-18 NOTE — ED Notes (Signed)
Lights dimmed pt resting at this time.  Pt given a warm blanket

## 2020-06-18 NOTE — Discharge Instructions (Signed)
Your evaluation today has been very reassuring, does not show an emergent problem with your heart or lungs, no signs of heart attack or blood clot, no evidence of pneumonia.  Your abdominal labs are reassuring as well.  Please follow-up with your PCP he did discuss the symptoms that have been going phone for the past year and to also discuss better management of anxiety.  Return to the ED for any new or worsening symptoms.
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
## 2020-06-24 ENCOUNTER — Ambulatory Visit: Payer: BC Managed Care – PPO | Admitting: Neurology

## 2020-06-28 ENCOUNTER — Ambulatory Visit: Payer: BC Managed Care – PPO | Admitting: Internal Medicine

## 2020-07-01 ENCOUNTER — Telehealth: Payer: Self-pay | Admitting: Obstetrics & Gynecology

## 2020-07-01 ENCOUNTER — Ambulatory Visit: Payer: BC Managed Care – PPO | Admitting: Obstetrics & Gynecology

## 2020-07-01 NOTE — Telephone Encounter (Signed)
LVM informing patient that our office is closed due to lack of water and to call our office to reschedule.

## 2020-07-02 ENCOUNTER — Ambulatory Visit: Payer: BC Managed Care – PPO | Admitting: Internal Medicine

## 2020-07-08 ENCOUNTER — Ambulatory Visit
Admission: EM | Admit: 2020-07-08 | Discharge: 2020-07-08 | Disposition: A | Discharge status: Home / Self Care | Payer: BC Managed Care – PPO | Attending: Emergency Medicine | Admitting: Emergency Medicine

## 2020-07-08 DIAGNOSIS — J069 Acute upper respiratory infection, unspecified: Secondary | ICD-10-CM | POA: Diagnosis not present

## 2020-07-08 DIAGNOSIS — Z1152 Encounter for screening for COVID-19: Secondary | ICD-10-CM | POA: Diagnosis not present

## 2020-07-08 DIAGNOSIS — H6593 Unspecified nonsuppurative otitis media, bilateral: Secondary | ICD-10-CM | POA: Diagnosis not present

## 2020-07-08 MED ORDER — FLUTICASONE PROPIONATE 50 MCG/ACT NA SUSP: 1.0000 | Freq: Every day | NASAL | 0 refills | Status: DC

## 2020-07-08 MED ORDER — PREDNISONE 10 MG PO TABS: 20.0000 mg | ORAL_TABLET | Freq: Every day | ORAL | 0 refills | Status: DC

## 2020-07-08 MED ORDER — BENZONATATE 100 MG PO CAPS: 100.0000 mg | ORAL_CAPSULE | Freq: Three times a day (TID) | ORAL | 0 refills | Status: DC | PRN

## 2020-07-08 MED ORDER — CETIRIZINE HCL 10 MG PO TABS: 10.0000 mg | ORAL_TABLET | Freq: Every day | ORAL | 0 refills | Status: DC
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
## 2020-07-08 NOTE — Discharge Instructions (Signed)
COVID testing ordered.  It will take between 2-7 days for test results.  Someone will contact you regarding abnormal results.    Get plenty of rest and push fluids Tessalon Perles prescribed for cough Zyrtec for nasal congestion, runny nose, and/or sore throat Flonase for nasal congestion and runny nose Prednisone was prescribed Use medications daily for symptom relief Use OTC medications like ibuprofen or tylenol as needed fever or pain Call or go to the ED if you have any new or worsening symptoms such as fever, worsening cough, shortness of breath, chest tightness, chest pain, turning blue, changes in mental status, etc..Marland Kitchen

## 2020-07-08 NOTE — ED Triage Notes (Signed)
Patient presents to Urgent Care with complaints of nasal congestion, fever, abdominal and bilateral ear pain since Friday. Pt had a positive rapid test on Friday.   Fever has resolved.

## 2020-07-09 ENCOUNTER — Ambulatory Visit: Payer: BC Managed Care – PPO | Admitting: Obstetrics & Gynecology

## 2020-07-09 LAB — SARS-COV-2, NAA 2 DAY TAT

## 2020-07-09 LAB — NOVEL CORONAVIRUS, NAA: SARS-CoV-2, NAA: DETECTED — AB

## 2020-07-10 ENCOUNTER — Telehealth: Payer: Self-pay | Admitting: *Deleted

## 2020-07-10 NOTE — Telephone Encounter (Signed)
Called to discuss with patient about COVID-19 symptoms and the use of one of the available treatments for those with mild to moderate Covid symptoms and at a high risk of hospitalization.  Pt appears to qualify for outpatient treatment due to co-morbid conditions and/or a member of an at-risk group in accordance with the FDA Emergency Use Authorization.    Symptom onset:No symptoms Vaccinated: no Booster?  Immunocompromised? No Qualifiers:  Patient reports no symptoms.  Yvette Mayer

## 2020-07-15 DIAGNOSIS — H9203 Otalgia, bilateral: Secondary | ICD-10-CM | POA: Diagnosis not present

## 2020-07-15 DIAGNOSIS — M2669 Other specified disorders of temporomandibular joint: Secondary | ICD-10-CM | POA: Diagnosis not present

## 2020-07-16 ENCOUNTER — Telehealth: Payer: Self-pay | Admitting: Family Medicine

## 2020-07-16 NOTE — Telephone Encounter (Signed)
Pt called stating that she was in severe pain and needing a prescription of ibuprofen sent to her pharmacy. Please call if this is possible.  Cb#: [phone]

## 2020-07-17 ENCOUNTER — Ambulatory Visit: Payer: Self-pay | Admitting: Family Medicine

## 2020-07-18 ENCOUNTER — Ambulatory Visit: Payer: BC Managed Care – PPO | Admitting: Adult Health

## 2020-07-26 ENCOUNTER — Encounter: Payer: Self-pay | Admitting: Neurology

## 2020-07-29 DIAGNOSIS — M9901 Segmental and somatic dysfunction of cervical region: Secondary | ICD-10-CM | POA: Diagnosis not present

## 2020-07-29 DIAGNOSIS — M542 Cervicalgia: Secondary | ICD-10-CM | POA: Diagnosis not present

## 2020-07-29 DIAGNOSIS — M9902 Segmental and somatic dysfunction of thoracic region: Secondary | ICD-10-CM | POA: Diagnosis not present

## 2020-07-29 DIAGNOSIS — M546 Pain in thoracic spine: Secondary | ICD-10-CM | POA: Diagnosis not present

## 2020-07-30 ENCOUNTER — Ambulatory Visit: Payer: BC Managed Care – PPO | Admitting: Internal Medicine

## 2020-07-30 ENCOUNTER — Telehealth: Payer: Self-pay

## 2020-07-30 ENCOUNTER — Other Ambulatory Visit: Payer: Self-pay

## 2020-07-30 ENCOUNTER — Encounter: Payer: Self-pay | Admitting: Internal Medicine

## 2020-07-30 VITALS — BP 101/63 | HR 65 | Temp 96.9°F | Ht 65.0 in | Wt 163.0 lb

## 2020-07-30 DIAGNOSIS — R1310 Dysphagia, unspecified: Secondary | ICD-10-CM

## 2020-07-30 DIAGNOSIS — K219 Gastro-esophageal reflux disease without esophagitis: Secondary | ICD-10-CM

## 2020-07-30 MED ORDER — SUCRALFATE 1 G PO TABS: 1.0000 g | ORAL_TABLET | Freq: Three times a day (TID) | ORAL | 1 refills | Status: DC

## 2020-07-30 NOTE — Telephone Encounter (Signed)
Called and informed pt of pre-op appt 08/07/20 at 7:30am.   Message sent to endo scheduler, pt doesn't need covid test at pre-op d/t being positive 07/08/20. No retest for 90 days.

## 2020-07-30 NOTE — Patient Instructions (Signed)
We will schedule an upper endoscopy with esophageal dilation -odynophagia/dysphagia (ASA 3-propofol).  For now, continue Protonix 40 mg daily  Add Carafate 1 g slurry open (1 tablet dissolved in a small amount of water per pharmacy).  Take 20 minutes before meals and nightly as needed (Dispense 60 with 3 refills).  Further recommendations to follow.
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
## 2020-08-05 NOTE — Patient Instructions (Signed)
Yvette Mayer  08/05/2020     @PREFPERIOPPHARMACY @   Your procedure is scheduled on  08/08/2020.   Report to [facility] at  1245  P.M.   Call this number if you have problems the morning of surgery:  [phone]   Remember:  Follow the diet instructions given to you by the office.                    Take these medicines the morning of surgery with A SIP OF WATER   Zyrtec, protonix.    Please brush your teeth.  Do not wear jewelry, make-up or nail polish.  Do not wear lotions, powders, or perfumes, or deodorant.  Do not shave 48 hours prior to surgery.  Men may shave face and neck.  Do not bring valuables to the hospital.  [facility] is not responsible for any belongings or valuables.  Contacts, dentures or bridgework may not be worn into surgery.  Leave your suitcase in the car.  After surgery it may be brought to your room.  For patients admitted to the hospital, discharge time will be determined by your treatment team.  Patients discharged the day of surgery will not be allowed to drive home and must have someone with them for 24 hours.    Special instructions:  DO NOT smoke tobacco or vape the morning of your procedure.   Please read over the following fact sheets that you were given. Anesthesia Post-op Instructions and Care and Recovery After Surgery       Upper Endoscopy, Adult, Care After This sheet gives you information about how to care for yourself after your procedure. Your health care provider may also give you more specific instructions. If you have problems or questions, contact your health care provider. What can I expect after the procedure? After the procedure, it is common to have:  A sore throat.  Mild stomach pain or discomfort.  Bloating.  Nausea. Follow these instructions at home:  Follow instructions from your health care provider about what to eat or drink after your procedure.  Return to your normal activities as told by  your health care provider. Ask your health care provider what activities are safe for you.  Take over-the-counter and prescription medicines only as told by your health care provider.  If you were given a sedative during the procedure, it can affect you for several hours. Do not drive or operate machinery until your health care provider says that it is safe.  Keep all follow-up visits as told by your health care provider. This is important.   Contact a health care provider if you have:  A sore throat that lasts longer than one day.  Trouble swallowing. Get help right away if:  You vomit blood or your vomit looks like coffee grounds.  You have: ? A fever. ? Bloody, black, or tarry stools. ? A severe sore throat or you cannot swallow. ? Difficulty breathing. ? Severe pain in your chest or abdomen. Summary  After the procedure, it is common to have a sore throat, mild stomach discomfort, bloating, and nausea.  If you were given a sedative during the procedure, it can affect you for several hours. Do not drive or operate machinery until your health care provider says that it is safe.  Follow instructions from your health care provider about what to eat or drink after your procedure.  Return to your normal activities  as told by your health care provider. This information is not intended to replace advice given to you by your health care provider. Make sure you discuss any questions you have with your health care provider. Document Revised: 05/16/2019 Document Reviewed: 10/18/2017 Elsevier Patient Education  2021 [place].  https://www.asge.org/home/for-patients/patient-information/understanding-eso-dilation-updated">  Esophageal Dilatation Esophageal dilatation, also called esophageal dilation, is a procedure to widen or open a blocked or narrowed part of the esophagus. The esophagus is the part of the body that moves food and liquid from the mouth to the stomach. You may need  this procedure if:  You have a buildup of scar tissue in your esophagus that makes it difficult, painful, or impossible to swallow. This can be caused by gastroesophageal reflux disease (GERD).  You have cancer of the esophagus.  There is a problem with how food moves through your esophagus. In some cases, you may need this procedure repeated at a later time to dilate the esophagus gradually. Tell a health care provider about:  Any allergies you have.  All medicines you are taking, including vitamins, herbs, eye drops, creams, and over-the-counter medicines.  Any problems you or family members have had with anesthetic medicines.  Any blood disorders you have.  Any surgeries you have had.  Any medical conditions you have.  Any antibiotic medicines you are required to take before dental procedures.  Whether you are pregnant or may be pregnant. What are the risks? Generally, this is a safe procedure. However, problems may occur, including:  Bleeding due to a tear in the lining of the esophagus.  A hole, or perforation, in the esophagus. What happens before the procedure?  Ask your health care provider about: ? Changing or stopping your regular medicines. This is especially important if you are taking diabetes medicines or blood thinners. ? Taking medicines such as aspirin and ibuprofen. These medicines can thin your blood. Do not take these medicines unless your health care provider tells you to take them. ? Taking over-the-counter medicines, vitamins, herbs, and supplements.  Follow instructions from your health care provider about eating or drinking restrictions.  Plan to have a responsible adult take you home from the hospital or clinic.  Plan to have a responsible adult care for you for the time you are told after you leave the hospital or clinic. This is important. What happens during the procedure?  You may be given a medicine to help you relax (sedative).  A numbing  medicine may be sprayed into the back of your throat, or you may gargle the medicine.  Your health care provider may perform the dilatation using various surgical instruments, such as: ? Simple dilators. This instrument is carefully placed in the esophagus to stretch it. ? Guided wire bougies. This involves using an endoscope to insert a wire into the esophagus. A dilator is passed over this wire to enlarge the esophagus. Then the wire is removed. ? Balloon dilators. An endoscope with a small balloon is inserted into the esophagus. The balloon is inflated to stretch the esophagus and open it up. The procedure may vary among health care providers and hospitals. What can I expect after the procedure?  Your blood pressure, heart rate, breathing rate, and blood oxygen level will be monitored until you leave the hospital or clinic.  Your throat may feel slightly sore and numb. This will get better over time.  You will not be allowed to eat or drink until your throat is no longer numb.  When you  are able to drink, urinate, and sit on the edge of the bed without nausea or dizziness, you may be able to return home. Follow these instructions at home:  Take over-the-counter and prescription medicines only as told by your health care provider.  If you were given a sedative during the procedure, it can affect you for several hours. Do not drive or operate machinery until your health care provider says that it is safe.  Plan to have a responsible adult care for you for the time you are told. This is important.  Follow instructions from your health care provider about any eating or drinking restrictions.  Do not use any products that contain nicotine or tobacco, such as cigarettes, e-cigarettes, and chewing tobacco. If you need help quitting, ask your health care provider.  Keep all follow-up visits. This is important. Contact a health care provider if:  You have a fever.  You have pain that is  not relieved by medicine. Get help right away if:  You have chest pain.  You have trouble breathing.  You have trouble swallowing.  You vomit blood.  You have black, tarry, or bloody stools. These symptoms may represent a serious problem that is an emergency. Do not wait to see if the symptoms will go away. Get medical help right away. Call your local emergency services (911 in the [place] Do not drive yourself to the hospital. Summary  Esophageal dilatation, also called esophageal dilation, is a procedure to widen or open a blocked or narrowed part of the esophagus.  Plan to have a responsible adult take you home from the hospital or clinic.  For this procedure, a numbing medicine may be sprayed into the back of your throat, or you may gargle the medicine.  Do not drive or operate machinery until your health care provider says that it is safe. This information is not intended to replace advice given to you by your health care provider. Make sure you discuss any questions you have with your health care provider. Document Revised: 10/04/2019 Document Reviewed: 10/04/2019 Elsevier Patient Education  2021 [place] After This sheet gives you information about how to care for yourself after your procedure. Your health care provider may also give you more specific instructions. If you have problems or questions, contact your health care provider. What can I expect after the procedure? After the procedure, it is common to have:  Tiredness.  Forgetfulness about what happened after the procedure.  Impaired judgment for important decisions.  Nausea or vomiting.  Some difficulty with balance. Follow these instructions at home: For the time period you were told by your health care provider:  Rest as needed.  Do not participate in activities where you could fall or become injured.  Do not drive or use machinery.  Do not drink alcohol.  Do not  take sleeping pills or medicines that cause drowsiness.  Do not make important decisions or sign legal documents.  Do not take care of children on your own.      Eating and drinking  Follow the diet that is recommended by your health care provider.  Drink enough fluid to keep your urine pale yellow.  If you vomit: ? Drink water, juice, or soup when you can drink without vomiting. ? Make sure you have little or no nausea before eating solid foods. General instructions  Have a responsible adult stay with you for the time you are told. It is important to have  someone help care for you until you are awake and alert.  Take over-the-counter and prescription medicines only as told by your health care provider.  If you have sleep apnea, surgery and certain medicines can increase your risk for breathing problems. Follow instructions from your health care provider about wearing your sleep device: ? Anytime you are sleeping, including during daytime naps. ? While taking prescription pain medicines, sleeping medicines, or medicines that make you drowsy.  Avoid smoking.  Keep all follow-up visits as told by your health care provider. This is important. Contact a health care provider if:  You keep feeling nauseous or you keep vomiting.  You feel light-headed.  You are still sleepy or having trouble with balance after 24 hours.  You develop a rash.  You have a fever.  You have redness or swelling around the IV site. Get help right away if:  You have trouble breathing.  You have new-onset confusion at home. Summary  For several hours after your procedure, you may feel tired. You may also be forgetful and have poor judgment.  Have a responsible adult stay with you for the time you are told. It is important to have someone help care for you until you are awake and alert.  Rest as told. Do not drive or operate machinery. Do not drink alcohol or take sleeping pills.  Get help right  away if you have trouble breathing, or if you suddenly become confused. This information is not intended to replace advice given to you by your health care provider. Make sure you discuss any questions you have with your health care provider. Document Revised: 02/01/2020 Document Reviewed: 04/20/2019 Elsevier Patient Education  2021 [employer].

## 2020-08-07 ENCOUNTER — Encounter (HOSPITAL_COMMUNITY): Payer: Self-pay

## 2020-08-07 ENCOUNTER — Other Ambulatory Visit (HOSPITAL_COMMUNITY): Payer: BC Managed Care – PPO

## 2020-08-07 ENCOUNTER — Other Ambulatory Visit: Payer: Self-pay

## 2020-08-07 ENCOUNTER — Encounter (HOSPITAL_COMMUNITY)
Admission: RE | Admit: 2020-08-07 | Discharge: 2020-08-07 | Disposition: A | Discharge status: Home / Self Care | Payer: BC Managed Care – PPO | Source: Ambulatory Visit | Attending: Internal Medicine | Admitting: Internal Medicine

## 2020-08-07 DIAGNOSIS — K219 Gastro-esophageal reflux disease without esophagitis: Secondary | ICD-10-CM | POA: Diagnosis not present

## 2020-08-07 DIAGNOSIS — Z20822 Contact with and (suspected) exposure to covid-19: Secondary | ICD-10-CM | POA: Diagnosis not present

## 2020-08-07 DIAGNOSIS — R1310 Dysphagia, unspecified: Secondary | ICD-10-CM | POA: Diagnosis not present

## 2020-08-07 DIAGNOSIS — Z01812 Encounter for preprocedural laboratory examination: Secondary | ICD-10-CM | POA: Insufficient documentation

## 2020-08-07 DIAGNOSIS — Z885 Allergy status to narcotic agent status: Secondary | ICD-10-CM | POA: Diagnosis not present

## 2020-08-07 DIAGNOSIS — Z79899 Other long term (current) drug therapy: Secondary | ICD-10-CM | POA: Diagnosis not present

## 2020-08-07 DIAGNOSIS — K222 Esophageal obstruction: Secondary | ICD-10-CM | POA: Diagnosis not present

## 2020-08-07 DIAGNOSIS — Z888 Allergy status to other drugs, medicaments and biological substances status: Secondary | ICD-10-CM | POA: Diagnosis not present

## 2020-08-07 DIAGNOSIS — Z79890 Hormone replacement therapy (postmenopausal): Secondary | ICD-10-CM | POA: Diagnosis not present

## 2020-08-07 DIAGNOSIS — R1314 Dysphagia, pharyngoesophageal phase: Secondary | ICD-10-CM | POA: Diagnosis not present

## 2020-08-07 DIAGNOSIS — R197 Diarrhea, unspecified: Secondary | ICD-10-CM | POA: Diagnosis not present

## 2020-08-07 DIAGNOSIS — M3500 Sicca syndrome, unspecified: Secondary | ICD-10-CM | POA: Diagnosis not present

## 2020-08-07 DIAGNOSIS — K5900 Constipation, unspecified: Secondary | ICD-10-CM | POA: Diagnosis not present

## 2020-08-07 DIAGNOSIS — K449 Diaphragmatic hernia without obstruction or gangrene: Secondary | ICD-10-CM | POA: Diagnosis not present

## 2020-08-07 HISTORY — DX: Weakness: R53.1

## 2020-08-07 LAB — CBC WITH DIFFERENTIAL/PLATELET
Abs Immature Granulocytes: 0.01 10*3/uL (ref 0.00–0.07)
Basophils Absolute: 0.1 10*3/uL (ref 0.0–0.1)
Basophils Relative: 1 %
Eosinophils Absolute: 0.1 10*3/uL (ref 0.0–0.5)
Eosinophils Relative: 3 %
HCT: 38.5 % (ref 36.0–46.0)
Hemoglobin: 12.5 g/dL (ref 12.0–15.0)
Immature Granulocytes: 0 %
Lymphocytes Relative: 53 %
Lymphs Abs: 2.1 10*3/uL (ref 0.7–4.0)
MCH: 31.2 pg (ref 26.0–34.0)
MCHC: 32.5 g/dL (ref 30.0–36.0)
MCV: 96 fL (ref 80.0–100.0)
Monocytes Absolute: 0.4 10*3/uL (ref 0.1–1.0)
Monocytes Relative: 11 %
Neutro Abs: 1.2 10*3/uL — ABNORMAL LOW (ref 1.7–7.7)
Neutrophils Relative %: 32 %
Platelets: 294 10*3/uL (ref 150–400)
RBC: 4.01 MIL/uL (ref 3.87–5.11)
RDW: 12.9 % (ref 11.5–15.5)
WBC: 3.8 10*3/uL — ABNORMAL LOW (ref 4.0–10.5)
nRBC: 0 % (ref 0.0–0.2)

## 2020-08-07 LAB — BASIC METABOLIC PANEL
Anion gap: 8 (ref 5–15)
BUN: 12 mg/dL (ref 6–20)
CO2: 25 mmol/L (ref 22–32)
Calcium: 8.7 mg/dL — ABNORMAL LOW (ref 8.9–10.3)
Chloride: 106 mmol/L (ref 98–111)
Creatinine, Ser: 0.71 mg/dL (ref 0.44–1.00)
GFR, Estimated: >60 mL/min (ref >60)
Glucose, Bld: 79 mg/dL (ref 70–99)
Potassium: 3.7 mmol/L (ref 3.5–5.1)
Sodium: 139 mmol/L (ref 135–145)

## 2020-08-08 ENCOUNTER — Ambulatory Visit (HOSPITAL_COMMUNITY): Payer: BC Managed Care – PPO | Admitting: Anesthesiology

## 2020-08-08 ENCOUNTER — Ambulatory Visit (HOSPITAL_COMMUNITY)
Admission: RE | Admit: 2020-08-08 | Discharge: 2020-08-08 | Disposition: A | Discharge status: Home / Self Care | Payer: BC Managed Care – PPO | Attending: Internal Medicine | Admitting: Internal Medicine

## 2020-08-08 ENCOUNTER — Other Ambulatory Visit: Payer: Self-pay

## 2020-08-08 ENCOUNTER — Encounter (HOSPITAL_COMMUNITY): Payer: Self-pay | Admitting: Internal Medicine

## 2020-08-08 ENCOUNTER — Encounter (HOSPITAL_COMMUNITY)
Admission: RE | Disposition: A | Discharge status: Home / Self Care | Payer: Self-pay | Source: Home / Self Care | Attending: Internal Medicine

## 2020-08-08 DIAGNOSIS — R1314 Dysphagia, pharyngoesophageal phase: Secondary | ICD-10-CM | POA: Diagnosis not present

## 2020-08-08 DIAGNOSIS — M3500 Sicca syndrome, unspecified: Secondary | ICD-10-CM | POA: Diagnosis not present

## 2020-08-08 DIAGNOSIS — R197 Diarrhea, unspecified: Secondary | ICD-10-CM | POA: Insufficient documentation

## 2020-08-08 DIAGNOSIS — K449 Diaphragmatic hernia without obstruction or gangrene: Secondary | ICD-10-CM | POA: Insufficient documentation

## 2020-08-08 DIAGNOSIS — R1310 Dysphagia, unspecified: Secondary | ICD-10-CM | POA: Insufficient documentation

## 2020-08-08 DIAGNOSIS — Z79899 Other long term (current) drug therapy: Secondary | ICD-10-CM | POA: Diagnosis not present

## 2020-08-08 DIAGNOSIS — Z20822 Contact with and (suspected) exposure to covid-19: Secondary | ICD-10-CM | POA: Insufficient documentation

## 2020-08-08 DIAGNOSIS — Z79890 Hormone replacement therapy (postmenopausal): Secondary | ICD-10-CM | POA: Insufficient documentation

## 2020-08-08 DIAGNOSIS — Z885 Allergy status to narcotic agent status: Secondary | ICD-10-CM | POA: Insufficient documentation

## 2020-08-08 DIAGNOSIS — M797 Fibromyalgia: Secondary | ICD-10-CM | POA: Diagnosis not present

## 2020-08-08 DIAGNOSIS — K219 Gastro-esophageal reflux disease without esophagitis: Secondary | ICD-10-CM | POA: Insufficient documentation

## 2020-08-08 DIAGNOSIS — K222 Esophageal obstruction: Secondary | ICD-10-CM | POA: Insufficient documentation

## 2020-08-08 DIAGNOSIS — Z888 Allergy status to other drugs, medicaments and biological substances status: Secondary | ICD-10-CM | POA: Insufficient documentation

## 2020-08-08 DIAGNOSIS — K5900 Constipation, unspecified: Secondary | ICD-10-CM | POA: Insufficient documentation

## 2020-08-08 HISTORY — PX: MALONEY DILATION: SHX5535

## 2020-08-08 HISTORY — PX: ESOPHAGOGASTRODUODENOSCOPY (EGD) WITH PROPOFOL: SHX5813

## 2020-08-08 SURGERY — ESOPHAGOGASTRODUODENOSCOPY (EGD) WITH PROPOFOL
Anesthesia: General

## 2020-08-08 MED ORDER — PROPOFOL 10 MG/ML IV BOLUS
INTRAVENOUS | Status: DC | PRN
  Administered 2020-08-08: 100 mg via INTRAVENOUS
  Administered 2020-08-08: 50 mg via INTRAVENOUS

## 2020-08-08 MED ORDER — LACTATED RINGERS IV SOLN: INTRAVENOUS | Status: DC

## 2020-08-08 MED ORDER — PROPOFOL 500 MG/50ML IV EMUL
INTRAVENOUS | Status: DC | PRN
  Administered 2020-08-08: 150 ug/kg/min via INTRAVENOUS

## 2020-08-08 MED ORDER — STERILE WATER FOR IRRIGATION IR SOLN
Status: DC | PRN
  Administered 2020-08-08: 100 mL

## 2020-08-08 MED ORDER — LIDOCAINE HCL (CARDIAC) PF 100 MG/5ML IV SOSY
PREFILLED_SYRINGE | INTRAVENOUS | Status: DC | PRN
  Administered 2020-08-08: 50 mg via INTRAVENOUS

## 2020-08-08 NOTE — Interval H&P Note (Signed)
History and Physical Interval Note:  08/08/2020 1:35 PM  Yvette Mayer  has presented today for surgery, with the diagnosis of odynophagia, dysphagia.  The various methods of treatment have been discussed with the patient and family. After consideration of risks, benefits and other options for treatment, the patient has consented to  Procedure(s) with comments: ESOPHAGOGASTRODUODENOSCOPY (EGD) WITH PROPOFOL (N/A) - PM (ASA 3), pt tested + 2/7 <90 days MALONEY DILATION (N/A) as a surgical intervention.  The patient's history has been reviewed, patient examined, no change in status, stable for surgery.  I have reviewed the patient's chart and labs.  Questions were answered to the patient's satisfaction.     Manus Rudd  Patient seen and examined.  No change.  Diagnostic EGD with esophageal dilation as feasible/appropriate per plan.  The risks, benefits, limitations, alternatives and imponderables have been reviewed with the patient. Potential for esophageal dilation, biopsy, etc. have also been reviewed.  Questions have been answered. All parties agreeable.

## 2020-08-08 NOTE — Op Note (Signed)
[facility] Patient Name: Yvette Mayer Procedure Date: 08/08/2020 12:53 PM MRN: [id number] Date of Birth: [birth year] Attending MD: Norvel Richards , MD CSN: [id number] Age: 50 Admit Type: Outpatient Procedure:                Upper GI endoscopy Indications:              Dysphagia Providers:                Norvel Richards, MD, Crystal Page, [place]                            Risa Grill, Technician Referring MD:              Medicines:                Propofol per Anesthesia, Monitored Anesthesia Care Complications:            No immediate complications. Estimated Blood Loss:     Estimated blood loss: none. Procedure:                Pre-Anesthesia Assessment:                           - Prior to the procedure, a History and Physical                            was performed, and patient medications and                            allergies were reviewed. The patient's tolerance of                            previous anesthesia was also reviewed. The risks                            and benefits of the procedure and the sedation                            options and risks were discussed with the patient.                            All questions were answered, and informed consent                            was obtained. Prior Anticoagulants: The patient has                            taken no previous anticoagulant or antiplatelet                            agents. ASA Grade Assessment: II - A patient with                            mild systemic disease. After reviewing the risks  and benefits, the patient was deemed in                            satisfactory condition to undergo the procedure.                           After obtaining informed consent, the endoscope was                            passed under direct vision. Throughout the                            procedure, the patient's blood pressure, pulse, and                             oxygen saturations were monitored continuously. The                            GIF-H190 ([id number]) scope was introduced through the                            mouth, and advanced to the second part of duodenum.                            The upper GI endoscopy was accomplished without                            difficulty. The patient tolerated the procedure                            well. Scope In: 1:42:23 PM Scope Out: 1:47:41 PM Total Procedure Duration: 0 hours 5 minutes 18 seconds  Findings:      A mild Schatzki ring was found at the gastroesophageal junction. No       nodularity. No esophagitis. The scope was withdrawn. Dilation was       performed with a Maloney dilator with mild resistance at 67 Fr. The       dilation site was examined following endoscope reinsertion and showed no       change. Estimated blood loss: none.      A small hiatal hernia was present. Stomach otherwise appeared normal      The exam was otherwise without abnormality. Impression:               - Mild Schatzki ring. Dilated.                           - Small hiatal hernia.                           - The examination was otherwise normal.                           - No specimens collected. Moderate Sedation:      Moderate (conscious) sedation was personally administered by an       anesthesia professional. The following parameters were monitored:  oxygen       saturation, heart rate, blood pressure, respiratory rate, EKG, adequacy       of pulmonary ventilation, and response to care. Recommendation:           - Patient has a contact number available for                            emergencies. The signs and symptoms of potential                            delayed complications were discussed with the                            patient. Return to normal activities tomorrow.                            Written discharge instructions were provided to the                            patient.                            - Resume previous diet.                           - Continue present medications.                           - Return to my office in 6 weeks. Continue Protonix                            40 mg daily. Begin Carafate as previously                            prescribed (not started yet). Procedure Code(s):        --- Professional ---                           [id number], Esophagogastroduodenoscopy, flexible,                            transoral; diagnostic, including collection of                            specimen(s) by brushing or washing, when performed                            (separate procedure)                           43450, Dilation of esophagus, by unguided sound or                            bougie, single or multiple passes Diagnosis Code(s):        --- Professional ---  K22.2, Esophageal obstruction                           K44.9, Diaphragmatic hernia without obstruction or                            gangrene                           R13.10, Dysphagia, unspecified CPT copyright 2019 American Medical Association. All rights reserved. The codes documented in this report are preliminary and upon coder review may  be revised to meet current compliance requirements. Cristopher Estimable. Neelam Tiggs, MD Norvel Richards, MD 08/08/2020 1:58:47 PM This report has been signed electronically. Number of Addenda: 0

## 2020-08-08 NOTE — Progress Notes (Signed)
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

## 2020-08-08 NOTE — Transfer of Care (Signed)
Immediate Anesthesia Transfer of Care Note  Patient: Yvette Mayer  Procedure(s) Performed: ESOPHAGOGASTRODUODENOSCOPY (EGD) WITH PROPOFOL (N/A ) MALONEY DILATION (N/A )  Patient Location: PACU  Anesthesia Type:General  Level of Consciousness: awake and alert   Airway & Oxygen Therapy: Patient Spontanous Breathing  Post-op Assessment: Report given to RN and Post -op Vital signs reviewed and stable  Post vital signs: Reviewed and stable  Last Vitals:  Vitals Value Taken Time  BP 91/50 08/08/20 1354  Temp    Pulse 61 08/08/20 1354  Resp 19 08/08/20 1354  SpO2 100 % 08/08/20 1354  Vitals shown include unvalidated device data.  Last Pain:  Vitals:   08/08/20 1337  TempSrc:   PainSc: 8          Complications: No complications documented.

## 2020-08-08 NOTE — Anesthesia Preprocedure Evaluation (Signed)
Anesthesia Evaluation  Patient identified by MRN, date of birth, ID band Patient awake    Reviewed: Allergy & Precautions, NPO status , Patient's Chart, lab work & pertinent test results  History of Anesthesia Complications Negative for: history of anesthetic complications  Airway Mallampati: II  TM Distance: >3 FB Neck ROM: Full   Comment: TMJ dislocation  Dental  (+) Dental Advisory Given, Teeth Intact   Pulmonary shortness of breath and with exertion, asthma ,    Pulmonary exam normal breath sounds clear to auscultation       Cardiovascular Exercise Tolerance: Good negative cardio ROS Normal cardiovascular exam Rhythm:Regular Rate:Normal     Neuro/Psych  Headaches, PSYCHIATRIC DISORDERS Anxiety Depression  Neuromuscular disease    GI/Hepatic Neg liver ROS, GERD  Medicated and Controlled,  Endo/Other  negative endocrine ROS  Renal/GU Renal disease     Musculoskeletal  (+) Arthritis  (chronic back pain), Fibromyalgia -TMJ Dysfunction   Abdominal   Peds  Hematology  (+) anemia ,   Anesthesia Other Findings   Reproductive/Obstetrics                             Anesthesia Physical Anesthesia Plan  ASA: II  Anesthesia Plan: General   Post-op Pain Management:    Induction:   PONV Risk Score and Plan: Propofol infusion and TIVA  Airway Management Planned: Nasal Cannula and Natural Airway  Additional Equipment:   Intra-op Plan:   Post-operative Plan:   Informed Consent: I have reviewed the patients History and Physical, chart, labs and discussed the procedure including the risks, benefits and alternatives for the proposed anesthesia with the patient or authorized representative who has indicated his/her understanding and acceptance.     Dental advisory given  Plan Discussed with: CRNA and Surgeon  Anesthesia Plan Comments:         Anesthesia Quick Evaluation

## 2020-08-08 NOTE — Anesthesia Postprocedure Evaluation (Signed)
Anesthesia Post Note  Patient: Yvette Mayer  Procedure(s) Performed: ESOPHAGOGASTRODUODENOSCOPY (EGD) WITH PROPOFOL (N/A ) MALONEY DILATION (N/A )  Patient location during evaluation: PACU Anesthesia Type: General Level of consciousness: awake and alert and oriented Pain management: pain level controlled Vital Signs Assessment: post-procedure vital signs reviewed and stable Respiratory status: spontaneous breathing, nonlabored ventilation and respiratory function stable Cardiovascular status: blood pressure returned to baseline and stable Postop Assessment: no apparent nausea or vomiting Anesthetic complications: no   No complications documented.   Last Vitals:  Vitals:   08/08/20 1400 08/08/20 1415  BP: 91/61 109/73  Pulse: (!) 52 (!) 51  Resp: 17 15  Temp:    SpO2: 100% 100%    Last Pain:  Vitals:   08/08/20 1415  TempSrc:   PainSc: 8                  Orlie Dakin

## 2020-08-08 NOTE — Discharge Instructions (Signed)
EGD Discharge instructions Please read the instructions outlined below and refer to this sheet in the next few weeks. These discharge instructions provide you with general information on caring for yourself after you leave the hospital. Your doctor may also give you specific instructions. While your treatment has been planned according to the most current medical practices available, unavoidable complications occasionally occur. If you have any problems or questions after discharge, please call your doctor. ACTIVITY  You may resume your regular activity but move at a slower pace for the next 24 hours.   Take frequent rest periods for the next 24 hours.   Walking will help expel (get rid of) the air and reduce the bloated feeling in your abdomen.   No driving for 24 hours (because of the anesthesia (medicine) used during the test).   You may shower.   Do not sign any important legal documents or operate any machinery for 24 hours (because of the anesthesia used during the test).  NUTRITION  Drink plenty of fluids.   You may resume your normal diet.   Begin with a light meal and progress to your normal diet.   Avoid alcoholic beverages for 24 hours or as instructed by your caregiver.  MEDICATIONS  You may resume your normal medications unless your caregiver tells you otherwise.  WHAT YOU CAN EXPECT TODAY  You may experience abdominal discomfort such as a feeling of fullness or "gas" pains.  FOLLOW-UP  Your doctor will discuss the results of your test with you.  SEEK IMMEDIATE MEDICAL ATTENTION IF ANY OF THE FOLLOWING OCCUR:  Excessive nausea (feeling sick to your stomach) and/or vomiting.   Severe abdominal pain and distention (swelling).   Trouble swallowing.   Temperature over 101 F (37.8 C).   Rectal bleeding or vomiting of blood.   Your esophagus was stretched today  Continue Protonix 40 mg daily  Please begin Carafate as previously prescribed  Office visit  with [place] Neil Crouch) in 6 weeks  At patient request I called Tiffany at [phone] -left message with impression and recommendations      Monitored Anesthesia Care, Care After This sheet gives you information about how to care for yourself after your procedure. Your health care provider may also give you more specific instructions. If you have problems or questions, contact your health care provider. What can I expect after the procedure? After the procedure, it is common to have:  Tiredness.  Forgetfulness about what happened after the procedure.  Impaired judgment for important decisions.  Nausea or vomiting.  Some difficulty with balance. Follow these instructions at home: For the time period you were told by your health care provider:  Rest as needed.  Do not participate in activities where you could fall or become injured.  Do not drive or use machinery.  Do not drink alcohol.  Do not take sleeping pills or medicines that cause drowsiness.  Do not make important decisions or sign legal documents.  Do not take care of children on your own.      Eating and drinking  Follow the diet that is recommended by your health care provider.  Drink enough fluid to keep your urine pale yellow.  If you vomit: ? Drink water, juice, or soup when you can drink without vomiting. ? Make sure you have little or no nausea before eating solid foods. General instructions  Have a responsible adult stay with you for the time you are told. It is important to have someone  help care for you until you are awake and alert.  Take over-the-counter and prescription medicines only as told by your health care provider.  If you have sleep apnea, surgery and certain medicines can increase your risk for breathing problems. Follow instructions from your health care provider about wearing your sleep device: ? Anytime you are sleeping, including during daytime naps. ? While taking  prescription pain medicines, sleeping medicines, or medicines that make you drowsy.  Avoid smoking.  Keep all follow-up visits as told by your health care provider. This is important. Contact a health care provider if:  You keep feeling nauseous or you keep vomiting.  You feel light-headed.  You are still sleepy or having trouble with balance after 24 hours.  You develop a rash.  You have a fever.  You have redness or swelling around the IV site. Get help right away if:  You have trouble breathing.  You have new-onset confusion at home. Summary  For several hours after your procedure, you may feel tired. You may also be forgetful and have poor judgment.  Have a responsible adult stay with you for the time you are told. It is important to have someone help care for you until you are awake and alert.  Rest as told. Do not drive or operate machinery. Do not drink alcohol or take sleeping pills.  Get help right away if you have trouble breathing, or if you suddenly become confused. This information is not intended to replace advice given to you by your health care provider. Make sure you discuss any questions you have with your health care provider. Document Revised: 02/01/2020 Document Reviewed: 04/20/2019 Elsevier Patient Education  2021 [employer].     Hiatal Hernia  A hiatal hernia occurs when part of the stomach slides above the muscle that separates the abdomen from the chest (diaphragm). A person can be born with a hiatal hernia (congenital), or it may develop over time. In almost all cases of hiatal hernia, only the top part of the stomach pushes through the diaphragm. Many people have a hiatal hernia with no symptoms. The larger the hernia, the more likely it is that you will have symptoms. In some cases, a hiatal hernia allows stomach acid to flow back into the tube that carries food from your mouth to your stomach (esophagus). This may cause heartburn symptoms.  Severe heartburn symptoms may mean that you have developed a condition called gastroesophageal reflux disease (GERD). What are the causes? This condition is caused by a weakness in the opening (hiatus) where the esophagus passes through the diaphragm to attach to the upper part of the stomach. A person may be born with a weakness in the hiatus, or a weakness can develop over time. What increases the risk? This condition is more likely to develop in:  Older people. Age is a major risk factor for a hiatal hernia, especially if you are over the age of 14.  Pregnant women.  People who are overweight.  People who have frequent constipation. What are the signs or symptoms? Symptoms of this condition usually develop in the form of GERD symptoms. Symptoms include:  Heartburn.  Belching.  Indigestion.  Trouble swallowing.  Coughing or wheezing.  Sore throat.  Hoarseness.  Chest pain.  Nausea and vomiting. How is this diagnosed? This condition may be diagnosed during testing for GERD. Tests that may be done include:  X-rays of your stomach or chest.  An upper gastrointestinal (GI) series. This is an  X-ray exam of your GI tract that is taken after you swallow a chalky liquid that shows up clearly on the X-ray.  Endoscopy. This is a procedure to look into your stomach using a thin, flexible tube that has a tiny camera and light on the end of it. How is this treated? This condition may be treated by:  Dietary and lifestyle changes to help reduce GERD symptoms.  Medicines. These may include: ? Over-the-counter antacids. ? Medicines that make your stomach empty more quickly. ? Medicines that block the production of stomach acid (H2 blockers). ? Stronger medicines to reduce stomach acid (proton pump inhibitors).  Surgery to repair the hernia, if other treatments are not helping. If you have no symptoms, you may not need treatment. Follow these instructions at home: Lifestyle  and activity  Do not use any products that contain nicotine or tobacco, such as cigarettes and e-cigarettes. If you need help quitting, ask your health care provider.  Try to achieve and maintain a healthy body weight.  Avoid putting pressure on your abdomen. Anything that puts pressure on your abdomen increases the amount of acid that may be pushed up into your esophagus. ? Avoid bending over, especially after eating. ? Raise the head of your bed by putting blocks under the legs. This keeps your head and esophagus higher than your stomach. ? Do not wear tight clothing around your chest or stomach. ? Try not to strain when having a bowel movement, when urinating, or when lifting heavy objects. Eating and drinking  Avoid foods that can worsen GERD symptoms. These may include: ? Fatty foods, like fried foods. ? Citrus fruits, like oranges or lemon. ? Other foods and drinks that contain acid, like orange juice or tomatoes. ? Spicy food. ? Chocolate.  Eat frequent small meals instead of three large meals a day. This helps prevent your stomach from getting too full. ? Eat slowly. ? Do not lie down right after eating. ? Do not eat 1-2 hours before bed.  Do not drink beverages with caffeine. These include cola, coffee, cocoa, and tea.  Do not drink alcohol. General instructions  Take over-the-counter and prescription medicines only as told by your health care provider.  Keep all follow-up visits as told by your health care provider. This is important. Contact a health care provider if:  Your symptoms are not controlled with medicines or lifestyle changes.  You are having trouble swallowing.  You have coughing or wheezing that will not go away. Get help right away if:  Your pain is getting worse.  Your pain spreads to your arms, neck, jaw, teeth, or back.  You have shortness of breath.  You sweat for no reason.  You feel sick to your stomach (nauseous) or you vomit.  You  vomit blood.  You have bright red blood in your stools.  You have black, tarry stools. This information is not intended to replace advice given to you by your health care provider. Make sure you discuss any questions you have with your health care provider. Document Revised: 04/30/2017 Document Reviewed: 12/21/2016 Elsevier Patient Education  [employer].

## 2020-08-09 ENCOUNTER — Ambulatory Visit: Payer: BC Managed Care – PPO | Admitting: Family Medicine

## 2020-08-09 ENCOUNTER — Encounter: Payer: Self-pay | Admitting: Family Medicine

## 2020-08-09 ENCOUNTER — Other Ambulatory Visit: Payer: Self-pay

## 2020-08-09 VITALS — BP 114/68 | HR 70 | Temp 98.7°F | Resp 16 | Ht 65.0 in | Wt 164.0 lb

## 2020-08-09 DIAGNOSIS — M797 Fibromyalgia: Secondary | ICD-10-CM

## 2020-08-09 DIAGNOSIS — F419 Anxiety disorder, unspecified: Secondary | ICD-10-CM | POA: Diagnosis not present

## 2020-08-09 DIAGNOSIS — E559 Vitamin D deficiency, unspecified: Secondary | ICD-10-CM | POA: Diagnosis not present

## 2020-08-09 DIAGNOSIS — R252 Cramp and spasm: Secondary | ICD-10-CM | POA: Diagnosis not present

## 2020-08-09 DIAGNOSIS — M26609 Unspecified temporomandibular joint disorder, unspecified side: Secondary | ICD-10-CM

## 2020-08-09 MED ORDER — LORAZEPAM 0.5 MG PO TABS: 0.5000 mg | ORAL_TABLET | Freq: Two times a day (BID) | ORAL | 1 refills | Status: DC | PRN

## 2020-08-09 NOTE — Patient Instructions (Signed)
We will call with lab results Call for a new PCP

## 2020-08-09 NOTE — Assessment & Plan Note (Signed)
Per my previous notes, this is source of a lot of her issues but she is not consistent with meds/ therapy Refilled prn ativan

## 2020-08-09 NOTE — Assessment & Plan Note (Signed)
Recheck labs Also states she gets leg cramps Check mag level

## 2020-08-09 NOTE — Assessment & Plan Note (Signed)
Follows with dentist, has chronic jaw, sinus issues Nothing thus far has really helped

## 2020-08-09 NOTE — Progress Notes (Signed)
   Subjective:    [name], female    DOB: 1970/09/24, 50 y.o.   MRN: [id number]  Patient presents for Illness (Sinus pressure) and Labs Only (Vit D)   Pt here to f/u meds   She continues to follow with GYN for her hormones   She follows with GI for her chronic stomach issues, has GERD/REFLUX, chronic GI discomfort and constipation    She has chronic anxiety/issues    Her Dentist has set up with PT for her TMJ issues - Dr. Augustina Mood DDS    She has been taking lorazepam 1mg  some nihts for sleep, doesn't take every day due to feeling drowsy if she has to get up early, she stopped lexapro before States she is going to do meditation and yoga for her anxiety       She needs VitamiN D checked, calcium was also low with GI, request recheck    Review Of Systems:  GEN- +fatigue, denies fever, weight loss,weakness, recent illness HEENT- denies eye drainage, change in vision, nasal discharge, CVS- denies chest pain, palpitations RESP- denies SOB, cough, wheeze ABD- denies N/V, change in stools, abd pain GU- denies dysuria, hematuria, dribbling, incontinence MSK- denies joint pain, muscle aches, injury Neuro- denies headache, dizziness, syncope, seizure activity       Objective:    BP 114/68   Pulse 70   Temp 98.7 F (37.1 C) (Temporal)   Resp 16   Ht 5\' 5"  (1.651 m)   Wt 164 lb (74.4 kg)   SpO2 96%   BMI 27.29 kg/m  GEN- NAD, alert and oriented x3 HEENT- PERRL, EOMI, non injected sclera, pink conjunctiva, MMM, oropharynx clear Neck- Supple, no thyromegaly CVS- RRR, no murmur RESP-CTAB ABD-NABS,soft,NT,ND Psych- anxious, not depressed, good eye contact, well groomed, no SI EXT- No edema Pulses- Radial 2+        Assessment & Plan:      Problem List Items Addressed This Visit      Unprioritized   Anxiety    Per my previous notes, this is source of a lot of her issues but she is not consistent with meds/ therapy Refilled prn ativan       Fibromyalgia   TMJ dysfunction - Primary    Follows with dentist, has chronic jaw, sinus issues Nothing thus far has really helped       Vitamin D deficiency    Recheck labs Also states she gets leg cramps Check mag level      Relevant Orders   Vitamin D, 25-hydroxy   PTH, Intact and Calcium    Other Visit Diagnoses    Muscle cramp       Relevant Orders   Comprehensive metabolic panel   PTH, Intact and Calcium   Magnesium   Hypocalcemia       Relevant Orders   PTH, Intact and Calcium   Magnesium      Note: This dictation was prepared with Dragon dictation along with smaller phrase technology. Any transcriptional errors that result from this process are unintentional.

## 2020-08-12 LAB — COMPREHENSIVE METABOLIC PANEL
AG Ratio: 1.3 (calc) (ref 1.0–2.5)
ALT: 22 U/L (ref 6–29)
AST: 26 U/L (ref 10–35)
Albumin: 4 g/dL (ref 3.6–5.1)
Alkaline phosphatase (APISO): 54 U/L (ref 31–125)
BUN: 8 mg/dL (ref 7–25)
CO2: 29 mmol/L (ref 20–32)
Calcium: 9.5 mg/dL (ref 8.6–10.2)
Chloride: 104 mmol/L (ref 98–110)
Creat: 0.77 mg/dL (ref 0.50–1.10)
Globulin: 3 g/dL (calc) (ref 1.9–3.7)
Glucose, Bld: 79 mg/dL (ref 65–99)
Potassium: 5.4 mmol/L — ABNORMAL HIGH (ref 3.5–5.3)
Sodium: 140 mmol/L (ref 135–146)
Total Bilirubin: 0.4 mg/dL (ref 0.2–1.2)
Total Protein: 7 g/dL (ref 6.1–8.1)

## 2020-08-12 LAB — PTH, INTACT AND CALCIUM
Calcium: 9.5 mg/dL (ref 8.6–10.2)
PTH: 42 pg/mL (ref 14–64)

## 2020-08-12 LAB — VITAMIN D 25 HYDROXY (VIT D DEFICIENCY, FRACTURES): Vit D, 25-Hydroxy: 27 ng/mL — ABNORMAL LOW (ref 30–100)

## 2020-08-12 LAB — MAGNESIUM: Magnesium: 2.2 mg/dL (ref 1.5–2.5)

## 2020-08-13 ENCOUNTER — Encounter (HOSPITAL_COMMUNITY): Payer: Self-pay | Admitting: Internal Medicine

## 2020-08-13 ENCOUNTER — Telehealth: Payer: Self-pay

## 2020-08-13 NOTE — Telephone Encounter (Signed)
received fax from [facility] and [facility] requesting sleep study records to be sent. Faxed to [phone]

## 2020-08-21 DIAGNOSIS — M9902 Segmental and somatic dysfunction of thoracic region: Secondary | ICD-10-CM | POA: Diagnosis not present

## 2020-08-21 DIAGNOSIS — M9901 Segmental and somatic dysfunction of cervical region: Secondary | ICD-10-CM | POA: Diagnosis not present

## 2020-08-21 DIAGNOSIS — M546 Pain in thoracic spine: Secondary | ICD-10-CM | POA: Diagnosis not present

## 2020-08-21 DIAGNOSIS — M542 Cervicalgia: Secondary | ICD-10-CM | POA: Diagnosis not present

## 2020-08-26 DIAGNOSIS — M545 Low back pain, unspecified: Secondary | ICD-10-CM | POA: Diagnosis not present

## 2020-08-26 DIAGNOSIS — R6884 Jaw pain: Secondary | ICD-10-CM | POA: Diagnosis not present

## 2020-08-26 DIAGNOSIS — M542 Cervicalgia: Secondary | ICD-10-CM | POA: Diagnosis not present

## 2020-08-26 DIAGNOSIS — R51 Headache with orthostatic component, not elsewhere classified: Secondary | ICD-10-CM | POA: Diagnosis not present

## 2020-08-30 ENCOUNTER — Ambulatory Visit: Payer: BC Managed Care – PPO | Admitting: Nurse Practitioner

## 2020-09-03 ENCOUNTER — Other Ambulatory Visit: Payer: Self-pay

## 2020-09-03 ENCOUNTER — Encounter: Payer: Self-pay | Admitting: Nurse Practitioner

## 2020-09-03 ENCOUNTER — Ambulatory Visit: Payer: BC Managed Care – PPO | Admitting: Nurse Practitioner

## 2020-09-03 VITALS — BP 120/82 | HR 70 | Temp 98.1°F | Resp 14 | Ht 65.0 in | Wt 166.0 lb

## 2020-09-03 DIAGNOSIS — J3089 Other allergic rhinitis: Secondary | ICD-10-CM | POA: Diagnosis not present

## 2020-09-03 DIAGNOSIS — M797 Fibromyalgia: Secondary | ICD-10-CM | POA: Diagnosis not present

## 2020-09-03 MED ORDER — CETIRIZINE HCL 10 MG PO TABS: 10.0000 mg | ORAL_TABLET | Freq: Every day | ORAL | 3 refills | Status: AC

## 2020-09-03 NOTE — Progress Notes (Signed)
Subjective:    [name], female    DOB: Dec 14, 1970, 50 y.o.   MRN: [id number]  HPI: Yvette Mayer is a 50 y.o. female presenting with multiple concerns.  Chief Complaint  Patient presents with  . Generalized Body Aches    Muscle pain  . Illness    Sinus issues  . Peripheral Neuropathy    Burning in feet   ARTHRALGIAS / JOINT ACHES Has been referred to Rheumatologist, overdue for follow up with them.  She recently started back taking Pilocarpine 5 mg, was not taking this for an unknown reason. Duration: years Pain: yes Symmetric: yes; however L>R currently Severity: moderate Quality: aching, burning Frequency: constant Context:  stable Decreased function/range of motion: no Erythema: no Swelling: no Heat or warmth: no Morning stiffness: yes Aggravating factors: using muscles, certain movements Alleviating factors: nothing so far Relief with NSAIDs?: No NSAIDs Taken Treatments attempted:  Flexeril,    UPPER RESPIRATORY TRACT INFECTION History of TMJ: going to see PT, has mouth guard. Onset: last year around September Worst symptom: pressure in head Fever: no Cough: yes; dry now - exposed to COVID in FE[facility] Shortness of breath: no Wheezing: no Chest pain: no Chest tightness: yes Chest congestion: no Nasal congestion: yes  Runny nose: no Post nasal drip: yes Sneezing: yes Sore throat: yes Swollen glands: no Sinus pressure: yes Headache: no Face pain: no Toothache: no Ear pain: yes, both side  Ear pressure: no  Eyes red/itching:no Eye drainage/crusting: no  Nausea: no  Vomiting: no Diarrhea: no  Change in appetite: no  Loss of taste/smell: no  Rash: no Fatigue: no Sick contacts: no Strep contacts: no  Context: stable Recurrent sinusitis: no Treatments attempted: Zyrtec, flonase,  Relief with OTC medications: Robitussin  Allergies  Allergen Reactions  . Hydrocodone-Acetaminophen Itching  . Percocet [Oxycodone-Acetaminophen]  Itching    Transient itching, not happened since   . Plaquenil [Hydroxychloroquine Sulfate] Rash    Outpatient Encounter Medications as of 09/03/2020  Medication Sig Note  . estradiol (ESTRACE) 2 MG tablet TAKE 1 TABLET BY MOUTH EVERY DAY (Patient taking differently: Take 2 mg by mouth daily.)   . fluticasone (FLONASE) 50 MCG/ACT nasal spray Place 1 spray into both nostrils daily for 14 days.   Marland Kitchen guaiFENesin (ROBITUSSIN) 100 MG/5ML liquid Take 200 mg by mouth 3 (three) times daily as needed for cough.   Marland Kitchen LORazepam (ATIVAN) 0.5 MG tablet Take 1 tablet (0.5 mg total) by mouth 2 (two) times daily as needed for anxiety.   . pantoprazole (PROTONIX) 40 MG tablet Take 40 mg by mouth daily.   . sucralfate (CARAFATE) 1 g tablet Take 1 tablet (1 g total) by mouth 4 (four) times daily -  with meals and at bedtime. 08/01/2020: Have not started  . VALERIAN ROOT PO Take 800 mg by mouth at bedtime. 400 mg each   . [DISCONTINUED] cetirizine (ZYRTEC ALLERGY) 10 MG tablet Take 1 tablet (10 mg total) by mouth daily.   . cetirizine (ZYRTEC ALLERGY) 10 MG tablet Take 1 tablet (10 mg total) by mouth daily.   . cyclobenzaprine (FLEXERIL) 5 MG tablet Take 1 tablet (5 mg total) by mouth at bedtime as needed for muscle spasms. (Patient not taking: Reported on 09/03/2020)    No facility-administered encounter medications on file as of 09/03/2020.    Patient Active Problem List   Diagnosis Date Noted  . Encounter for screening fecal occult blood testing 05/09/2020  . Encounter for well  woman exam with routine gynecological exam 05/09/2020  . Irritable bowel syndrome with both constipation and diarrhea 05/09/2020  . Menopause 04/06/2019  . Sleep disturbance 04/06/2019  . Current use of estrogen therapy 04/06/2019  . Carpal tunnel syndrome 03/06/2019  . Sensation of fullness in both ears 03/01/2019  . Abdominal pain 03/02/2018  . History of UTI 11/29/2017  . Dyspareunia, female 11/29/2017  . Seasonal and perennial  allergic rhinitis 10/12/2017  . Adverse food reaction 10/12/2017  . ANA positive Speckled 1:320 titer ENA negative  11/20/2016  . Leukopenia 11/11/2016  . History of bruising easily 09/08/2016  . Borderline high cholesterol 09/08/2016  . Situational mixed anxiety and depressive disorder 08/24/2016  . Rectocele 07/16/2016  . Kidney stone on right side 10/24/2015  . Hot flashes 04/10/2015  . Symptoms, such as flushing, sleeplessness, headache, lack of concentration, associated with the menopause 04/10/2015  . 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 Constipation 12/26/2013  . Overweight (BMI 25.0-29.9) 12/26/2013  . Sjogren's syndrome ([place]) 08/09/2013  . Chronic insomnia 09/13/2012  . GERD (gastroesophageal reflux disease) 09/18/2011  . Anxiety 09/03/2011  . Hyperlipidemia 09/02/2011  . Fibromyalgia 09/02/2011  . Chronic nasal congestion 09/02/2011  . Vitamin D deficiency 09/02/2011  . Dysphagia 01/13/2011    Past Medical History:  Diagnosis Date  . Abdominal pain 06/12/2014  . Allergy   . Anemia    iron 1 yr ago, normal hgb 7/12  . Anxiety   . Asthma    pt says no-bronchitis  . Bloated abdomen 10/15/2015  . Chronic back pain   . Chronic headaches   . Chronic neck pain   . Chronic sinusitis   . Current use of estrogen therapy 04/16/2015  . Dyspareunia 09/12/2014  . Elevated TSH 04/16/2015  . Fatigue 04/10/2015  . Fibromyalgia   . Gas 10/15/2015  . GERD (gastroesophageal reflux disease)   . Herpes simplex virus (HSV) infection   . History of kidney stones   . Hot flashes 04/10/2015  . Hyperlipidemia   . Kidney stone on right side 10/24/2015  . Left-sided weakness   . Mental disorder   . Ovarian cyst, right   . Paresthesias   . Shortness of breath   . Sinus drainage   . Sjoegren syndrome   . Symptoms, such as flushing, sleeplessness, headache, lack of concentration, associated with the menopause 04/10/2015  . TMJ (dislocation of temporomandibular joint)   . Vaginal discharge  08/08/2015  . Vertigo   . Yeast infection 10/15/2015    Relevant past medical, surgical, family and social history reviewed and updated as indicated. Interim medical history since our last visit reviewed.  Review of Systems Per HPI unless specifically indicated above     Objective:    BP 120/82   Pulse 70   Temp 98.1 F (36.7 C) (Temporal)   Resp 14   Ht 5\' 5"  (1.651 m)   Wt 166 lb (75.3 kg)   SpO2 99%   BMI 27.62 kg/m   Wt Readings from Last 3 Encounters:  09/03/20 166 lb (75.3 kg)  08/09/20 164 lb (74.4 kg)  08/07/20 160 lb (72.6 kg)    Physical Exam Vitals and nursing note reviewed.  Constitutional:      General: She is not in acute distress.    Appearance: Normal appearance. She is not toxic-appearing.  HENT:     Head: Normocephalic and atraumatic.     Right Ear: Tympanic membrane, ear canal and external ear normal.     Left Ear: Tympanic  membrane, ear canal and external ear normal.     Nose: Congestion present. No rhinorrhea.     Mouth/Throat:     Mouth: Mucous membranes are moist.     Pharynx: Oropharynx is clear. Posterior oropharyngeal erythema present.  Eyes:     General: No scleral icterus.    Extraocular Movements: Extraocular movements intact.  Cardiovascular:     Rate and Rhythm: Normal rate. Rhythm irregular.     Heart sounds: Normal heart sounds. No murmur heard.   Pulmonary:     Effort: Pulmonary effort is normal. No respiratory distress.     Breath sounds: Normal breath sounds. No wheezing, rhonchi or rales.  Musculoskeletal:        General: Normal range of motion.     Cervical back: Normal range of motion.     Comments: Moving all 4 extremities without issue  Lymphadenopathy:     Cervical: No cervical adenopathy.  Skin:    General: Skin is warm and dry.     Capillary Refill: Capillary refill takes less than 2 seconds.     Coloration: Skin is not jaundiced or pale.     Findings: No erythema.  Neurological:     Mental Status: She is alert  and oriented to person, place, and time.     Motor: No weakness.     Gait: Gait normal.  Psychiatric:        Mood and Affect: Affect normal. Mood is anxious.        Speech: Speech is rapid and pressured.        Behavior: Behavior normal. Behavior is cooperative.        Thought Content: Thought content normal.        Cognition and Memory: Cognition and memory normal.       Assessment & Plan:  1. Allergic Rhinitis Chronic.  Recently ran out of Zyrtec, will resume today.  No s/s bacterial infection today, will hold off on antibiotics at this time.    - cetirizine (ZYRTEC ALLERGY) 10 MG tablet; Take 1 tablet (10 mg total) by mouth daily.  Dispense: 90 tablet; Refill: 3  2. Fibromyalgia Chronic.  With current muscle aches - question current fibromyalgia flare.  Continue as needed flexeril.  Encouraged patient to follow up with Rheumatology for ongoing management/treatment.    Follow up plan: Return for with new PCP.

## 2020-09-03 NOTE — Patient Instructions (Signed)
Be sure to call your Rheumatologist to schedule a follow up appointment.

## 2020-09-16 ENCOUNTER — Ambulatory Visit: Payer: BC Managed Care – PPO | Admitting: Adult Health

## 2020-09-24 ENCOUNTER — Ambulatory Visit: Payer: BC Managed Care – PPO | Admitting: Gastroenterology

## 2020-09-24 ENCOUNTER — Encounter: Payer: Self-pay | Admitting: Internal Medicine

## 2020-09-29 IMAGING — DX DG ABDOMEN ACUTE W/ 1V CHEST
3 series · 3 of 3 positions shown · non-contrast
Comparison: Chest x-ray dated 05/31/2016 plain film of the abdomen
dated 08/05/2017.

CLINICAL DATA: Constipation for 2 weeks, lower abdominal pain, back
pain, nausea.

EXAM:
DG ABDOMEN ACUTE W/ 1V CHEST

[chest pa]
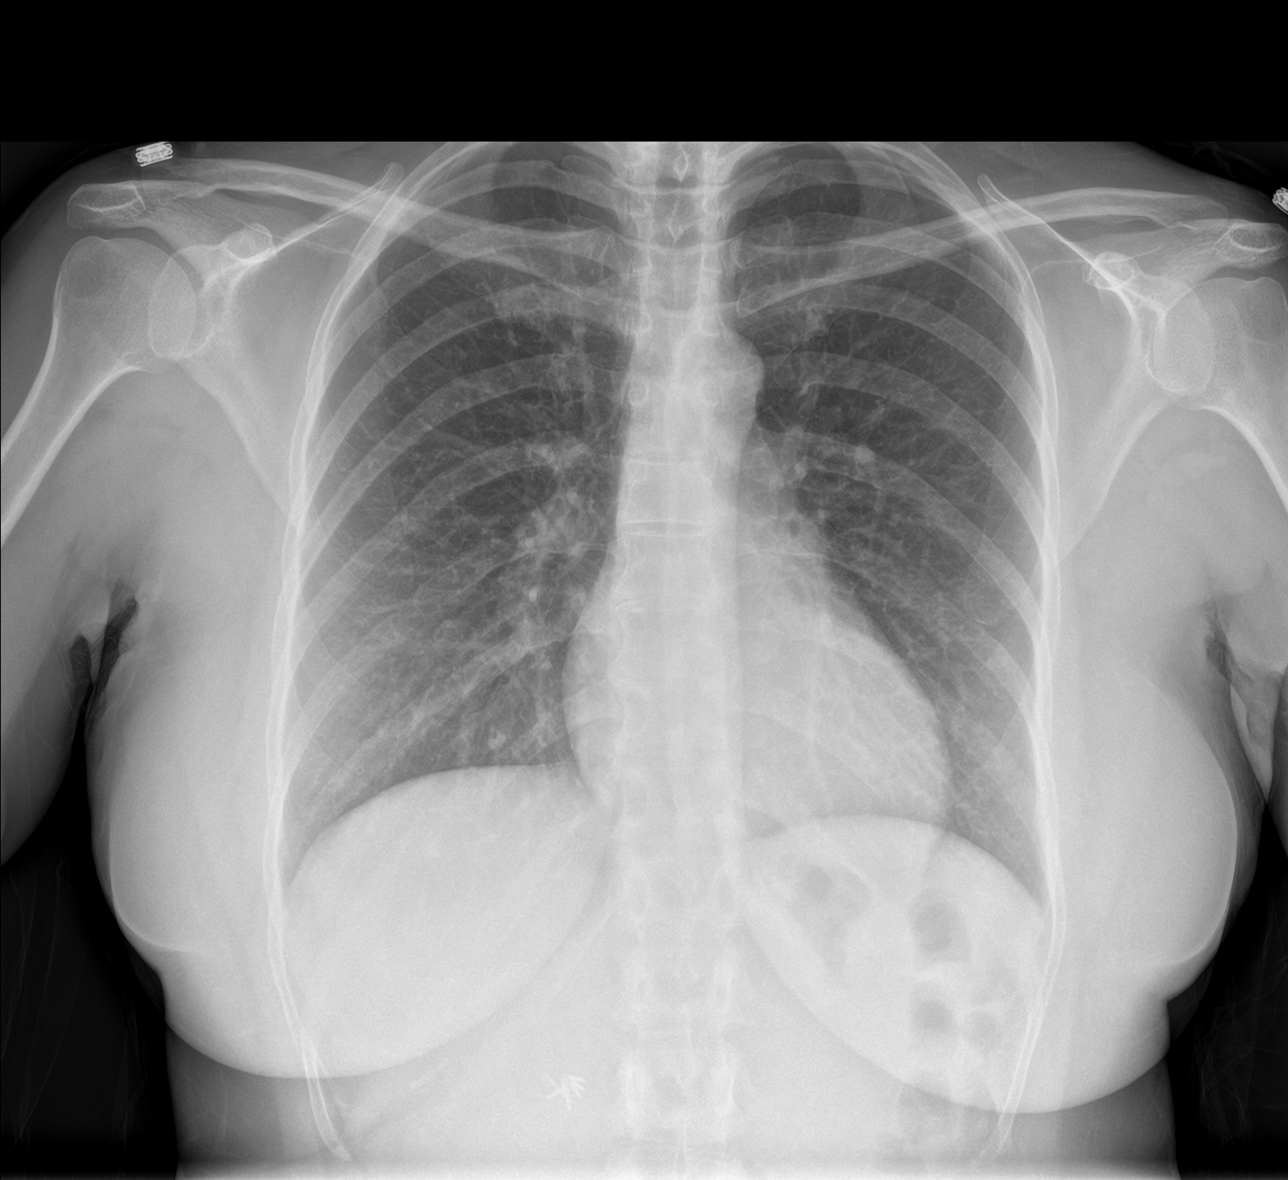

[abdomen erect]
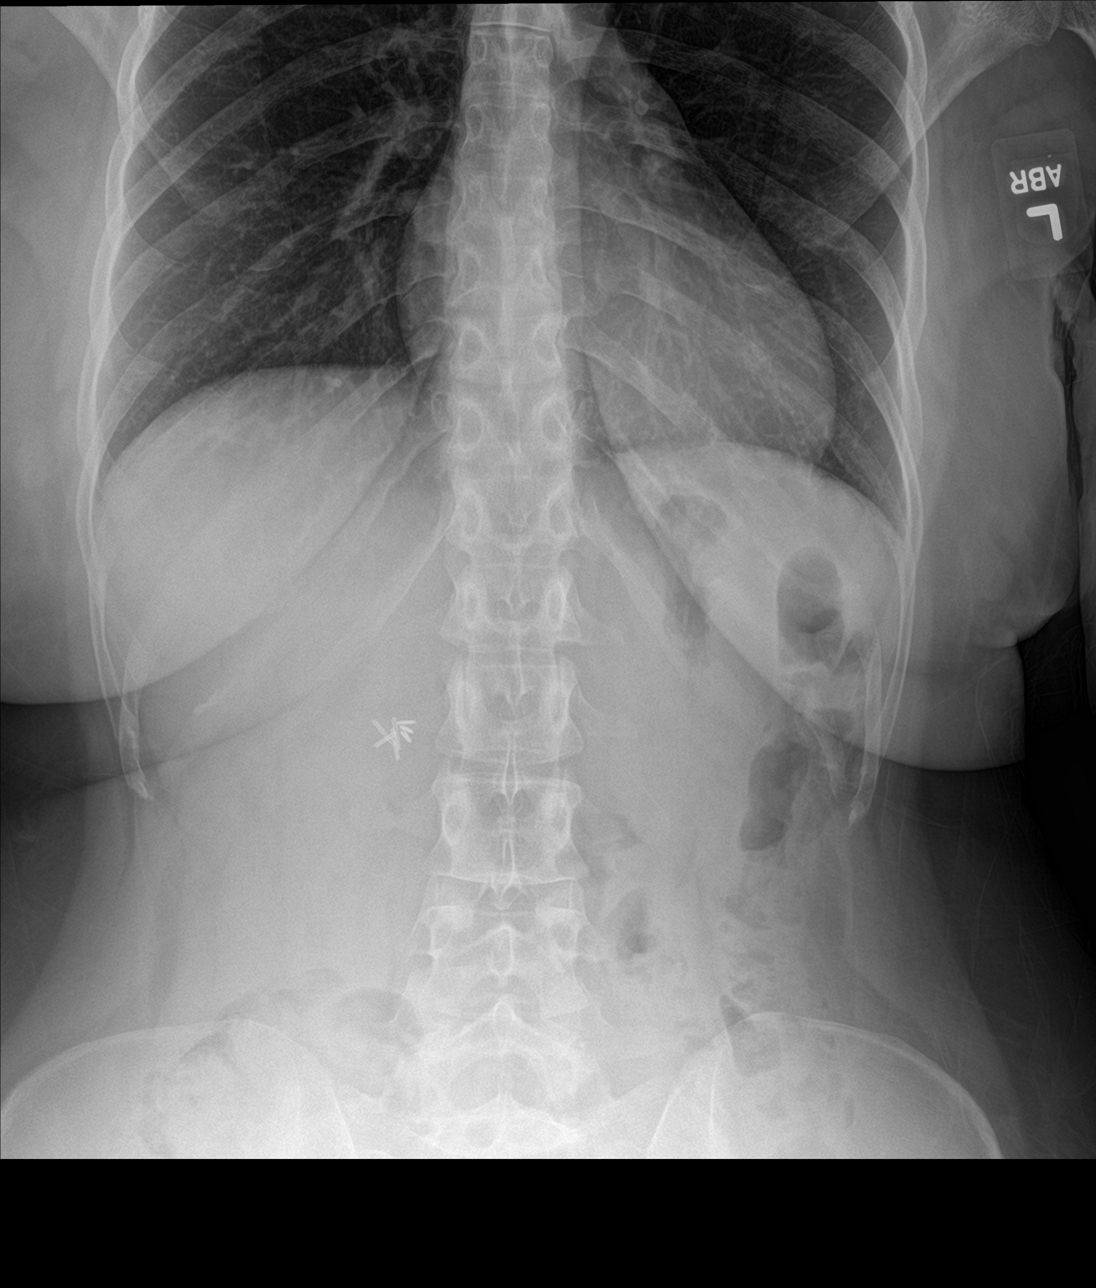

[abdomen supine]
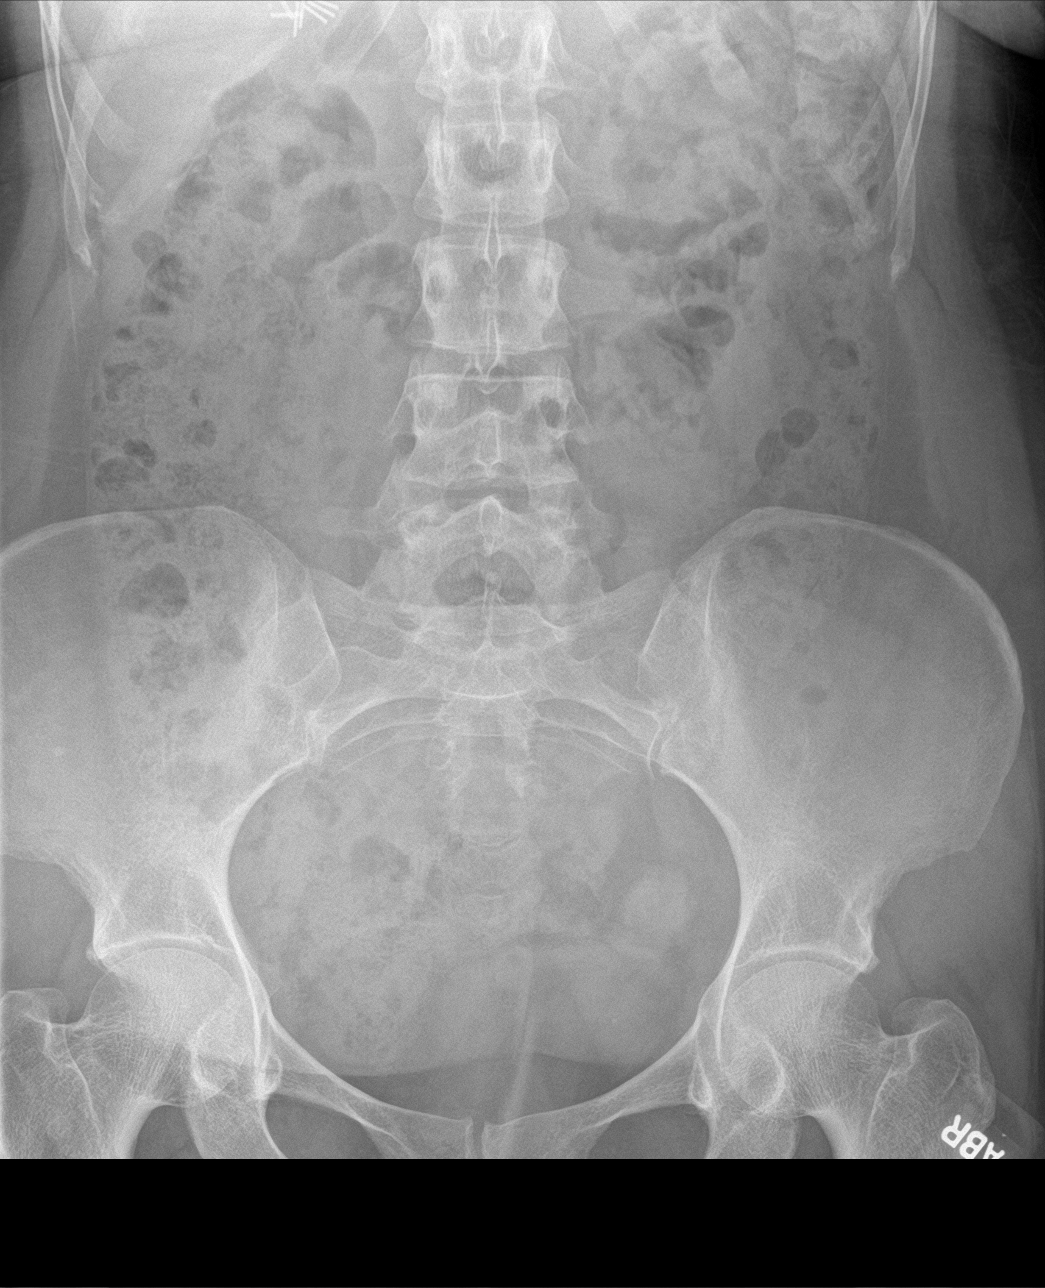

[3 of 3 positions shown; findings below may reference images not displayed]

FINDINGS: Single-view of the chest: Heart size and mediastinal contours are
within normal limits. Lungs are clear. No pleural effusion seen.
Osseous structures about the chest are unremarkable.

Bowel gas pattern is nonobstructive. Fairly large amount of stool
throughout the nondistended colon. No evidence of soft tissue mass
or abnormal fluid collection. No evidence of free intraperitoneal
air. No evidence of renal or ureteral calculi. Cholecystectomy clips
in the RIGHT upper quadrant. Visualized osseous structures of the
abdomen and pelvis are unremarkable.
IMPRESSION: 1. Lungs are clear. No evidence of acute cardiopulmonary
abnormality.
2. Nonobstructive bowel gas pattern. Fairly large amount of stool
throughout the colon, compatible with the given history of
constipation.

## 2020-10-01 ENCOUNTER — Ambulatory Visit: Payer: BC Managed Care – PPO | Admitting: Neurology

## 2020-10-02 ENCOUNTER — Ambulatory Visit: Payer: BC Managed Care – PPO | Admitting: Adult Health

## 2020-10-04 ENCOUNTER — Other Ambulatory Visit: Payer: Self-pay | Admitting: Family Medicine

## 2020-10-04 MED ORDER — LORAZEPAM 0.5 MG PO TABS: 0.5000 mg | ORAL_TABLET | Freq: Two times a day (BID) | ORAL | 0 refills | Status: DC | PRN

## 2020-10-04 NOTE — Telephone Encounter (Signed)
PDMP reviewed.  Will give courtesy refill.  No further refills will be given.

## 2020-10-04 NOTE — Telephone Encounter (Signed)
Ok to refill??  Last office visit 09/03/2020  Last refill 08/09/2020.

## 2020-10-14 ENCOUNTER — Ambulatory Visit: Payer: BC Managed Care – PPO | Admitting: Adult Health

## 2020-10-17 NOTE — Progress Notes (Deleted)
Office Visit Note  Patient: Yvette Mayer             Date of Birth: 11/11/1970           MRN: [id number]             PCP: No primary care provider on file. Referring: No ref. provider found Visit Date: 10/31/2020 Occupation: @GUAROCC @  Subjective:  No chief complaint on file.   History of Present Illness: Yvette Mayer is a 50 y.o. female ***   Activities of Daily Living:  Patient reports morning stiffness for *** {minute/hour:19697}.   Patient {ACTIONS;DENIES/REPORTS:21021675::"Denies"} nocturnal pain.  Difficulty dressing/grooming: {ACTIONS;DENIES/REPORTS:21021675::"Denies"} Difficulty climbing stairs: {ACTIONS;DENIES/REPORTS:21021675::"Denies"} Difficulty getting out of chair: {ACTIONS;DENIES/REPORTS:21021675::"Denies"} Difficulty using hands for taps, buttons, cutlery, and/or writing: {ACTIONS;DENIES/REPORTS:21021675::"Denies"}  No Rheumatology ROS completed.   PMFS History:  Patient Active Problem List   Diagnosis Date Noted  . Encounter for screening fecal occult blood testing 05/09/2020  . Encounter for well woman exam with routine gynecological exam 05/09/2020  . Irritable bowel syndrome with both constipation and diarrhea 05/09/2020  . Menopause 04/06/2019  . Sleep disturbance 04/06/2019  . Current use of estrogen therapy 04/06/2019  . Carpal tunnel syndrome 03/06/2019  . Sensation of fullness in both ears 03/01/2019  . Abdominal pain 03/02/2018  . History of UTI 11/29/2017  . Dyspareunia, female 11/29/2017  . Seasonal and perennial allergic rhinitis 10/12/2017  . Adverse food reaction 10/12/2017  . ANA positive Speckled 1:320 titer ENA negative  11/20/2016  . Leukopenia 11/11/2016  . History of bruising easily 09/08/2016  . Borderline high cholesterol 09/08/2016  . Situational mixed anxiety and depressive disorder 08/24/2016  . Rectocele 07/16/2016  . Kidney stone on right side 10/24/2015  . Hot flashes 04/10/2015  . Symptoms, such as flushing,  sleeplessness, headache, lack of concentration, associated with the menopause 04/10/2015  . 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 Constipation 12/26/2013  . Overweight (BMI 25.0-29.9) 12/26/2013  . Sjogren's syndrome ([place]) 08/09/2013  . Chronic insomnia 09/13/2012  . GERD (gastroesophageal reflux disease) 09/18/2011  . Anxiety 09/03/2011  . Hyperlipidemia 09/02/2011  . Fibromyalgia 09/02/2011  . Chronic nasal congestion 09/02/2011  . Vitamin D deficiency 09/02/2011  . Dysphagia 01/13/2011    Past Medical History:  Diagnosis Date  . Abdominal pain 06/12/2014  . Allergy   . Anemia    iron 1 yr ago, normal hgb 7/12  . Anxiety   . Asthma    pt says no-bronchitis  . Bloated abdomen 10/15/2015  . Chronic back pain   . Chronic headaches   . Chronic neck pain   . Chronic sinusitis   . Current use of estrogen therapy 04/16/2015  . Dyspareunia 09/12/2014  . Elevated TSH 04/16/2015  . Fatigue 04/10/2015  . Fibromyalgia   . Gas 10/15/2015  . GERD (gastroesophageal reflux disease)   . Herpes simplex virus (HSV) infection   . History of kidney stones   . Hot flashes 04/10/2015  . Hyperlipidemia   . Kidney stone on right side 10/24/2015  . Left-sided weakness   . Mental disorder   . Ovarian cyst, right   . Paresthesias   . Shortness of breath   . Sinus drainage   . Sjoegren syndrome   . Symptoms, such as flushing, sleeplessness, headache, lack of concentration, associated with the menopause 04/10/2015  . TMJ (dislocation of temporomandibular joint)   . Vaginal discharge 08/08/2015  . Vertigo   . Yeast infection 10/15/2015    Family History  Problem Relation  Age of Onset  . Hypertension Mother   . Thyroid disease Sister        overactive  . Asthma Sister   . Eczema Sister   . Bronchitis Son   . Heart disease Maternal Grandmother   . Hypertension Maternal Grandmother   . Alcohol abuse Maternal Grandfather   . Scoliosis Son   . Other Maternal Aunt        Sjeogren syndrome  . Other  Maternal Aunt        Sjoegren syndrome  . Other Maternal Aunt        Sjoegren syndrome  . Anesthesia problems Neg Hx   . Malignant hyperthermia Neg Hx   . Hypotension Neg Hx   . Pseudochol deficiency Neg Hx   . Colon cancer Neg Hx    Past Surgical History:  Procedure Laterality Date  . ABDOMINAL HYSTERECTOMY    . BIOPSY  09/09/2016   Procedure: BIOPSY;  Surgeon: Daneil Dolin, MD;  [facility];  Service: Endoscopy;;  esophageal  . BREAST CYST EXCISION Right 1980   benign  . CESAREAN SECTION     x2  . CHOLECYSTECTOMY  2005   cholelithiasis  . COLONOSCOPY N/A 09/09/2016   Procedure: COLONOSCOPY;  Surgeon: Daneil Dolin, MD;  [facility];  Service: Endoscopy;  Laterality: N/A;  8:30am  . ESOPHAGOGASTRODUODENOSCOPY  01/23/2011   NGE:[id number] undulating Z-line vs short segment Barrett s/p bx (NOT BARRETT's)/small HH otherwise normal  . ESOPHAGOGASTRODUODENOSCOPY N/A 09/09/2016   Procedure: ESOPHAGOGASTRODUODENOSCOPY (EGD);  Surgeon: Daneil Dolin, MD;  [facility];  Service: Endoscopy;  Laterality: N/A;  . ESOPHAGOGASTRODUODENOSCOPY (EGD) WITH PROPOFOL N/A 08/08/2020   Procedure: ESOPHAGOGASTRODUODENOSCOPY (EGD) WITH PROPOFOL;  Surgeon: Daneil Dolin, MD;  [facility];  Service: Endoscopy;  Laterality: N/A;  PM (ASA 3), pt tested + 2/7 <90 days  . MALONEY DILATION  01/23/2011   Procedure: Venia Minks DILATION;  Surgeon: Daneil Dolin, MD;  [facility];  Service: Endoscopy;  Laterality: N/A;  . Venia Minks DILATION N/A 09/09/2016   Procedure: Venia Minks DILATION;  Surgeon: Daneil Dolin, MD;  [facility];  Service: Endoscopy;  Laterality: N/A;  Venia Minks DILATION N/A 08/08/2020   Procedure: Venia Minks DILATION;  Surgeon: Daneil Dolin, MD;  [facility];  Service: Endoscopy;  Laterality: N/A;  . OVARIAN CYST REMOVAL  92 removal of cysts from behind ovaries  . PARTIAL HYSTERECTOMY  93  . SEPTOPLASTY     2011  .  TEMPOROMANDIBULAR JOINT SURGERY    . TONSILLECTOMY     2010  . TURBINATE RESECTION Bilateral 05/29/2013   Procedure: TURBINATE RESECTION;  Surgeon: Ascencion Dike, MD;  Location: [place];  Service: ENT;  Laterality: Bilateral;  . UPPER GASTROINTESTINAL ENDOSCOPY     Social History   Social History Narrative  . Not on file   Immunization History  Administered Date(s) Administered  . Tdap 09/18/2011     Objective: Vital Signs: There were no vitals taken for this visit.   Physical Exam   Musculoskeletal Exam: ***  CDAI Exam: CDAI Score: -- Patient Global: --; Provider Global: -- Swollen: --; Tender: -- Joint Exam 10/31/2020   No joint exam has been documented for this visit   There is currently no information documented on the homunculus. Go to the Rheumatology activity and complete the homunculus joint exam.  Investigation: No additional findings.  Imaging: No results found.  Recent  Labs: Lab Results  Component Value Date   WBC 3.8 (L) 08/07/2020   HGB 12.5 08/07/2020   PLT 294 08/07/2020   NA 140 08/09/2020   K 5.4 (H) 08/09/2020   CL 104 08/09/2020   CO2 29 08/09/2020   GLUCOSE 79 08/09/2020   BUN 8 08/09/2020   CREATININE 0.77 08/09/2020   BILITOT 0.4 08/09/2020   ALKPHOS 52 06/18/2020   AST 26 08/09/2020   ALT 22 08/09/2020   PROT 7.0 08/09/2020   ALBUMIN 3.4 (L) 06/18/2020   CALCIUM 9.5 08/09/2020   CALCIUM 9.5 08/09/2020   GFRAA 103 10/03/2019    Speciality Comments: No specialty comments available.  Procedures:  No procedures performed Allergies: Hydrocodone-acetaminophen, Percocet [oxycodone-acetaminophen], and Plaquenil [hydroxychloroquine sulfate]   Assessment / Plan:     Visit Diagnoses: No diagnosis found.  Orders: No orders of the defined types were placed in this encounter.  No orders of the defined types were placed in this encounter.   Face-to-face time spent with patient was *** minutes. Greater than 50% of  time was spent in counseling and coordination of care.  Follow-Up Instructions: No follow-ups on file.   Earnestine Mealing, CMA  Note - This record has been created using Editor, commissioning.  Chart creation errors have been sought, but may not always  have been located. Such creation errors do not reflect on  the standard of medical care.

## 2020-10-22 DIAGNOSIS — M542 Cervicalgia: Secondary | ICD-10-CM | POA: Diagnosis not present

## 2020-10-22 DIAGNOSIS — R531 Weakness: Secondary | ICD-10-CM | POA: Diagnosis not present

## 2020-10-22 DIAGNOSIS — M2669 Other specified disorders of temporomandibular joint: Secondary | ICD-10-CM | POA: Diagnosis not present

## 2020-10-22 DIAGNOSIS — M2569 Stiffness of other specified joint, not elsewhere classified: Secondary | ICD-10-CM | POA: Diagnosis not present

## 2020-10-31 ENCOUNTER — Ambulatory Visit: Payer: BC Managed Care – PPO | Admitting: Rheumatology

## 2020-10-31 DIAGNOSIS — R748 Abnormal levels of other serum enzymes: Secondary | ICD-10-CM

## 2020-10-31 DIAGNOSIS — Z8719 Personal history of other diseases of the digestive system: Secondary | ICD-10-CM

## 2020-10-31 DIAGNOSIS — G4711 Idiopathic hypersomnia with long sleep time: Secondary | ICD-10-CM

## 2020-10-31 DIAGNOSIS — R768 Other specified abnormal immunological findings in serum: Secondary | ICD-10-CM

## 2020-10-31 DIAGNOSIS — Z8639 Personal history of other endocrine, nutritional and metabolic disease: Secondary | ICD-10-CM

## 2020-10-31 DIAGNOSIS — F32A Depression, unspecified: Secondary | ICD-10-CM

## 2020-10-31 DIAGNOSIS — M797 Fibromyalgia: Secondary | ICD-10-CM

## 2020-10-31 DIAGNOSIS — R5383 Other fatigue: Secondary | ICD-10-CM

## 2020-10-31 DIAGNOSIS — M3500 Sicca syndrome, unspecified: Secondary | ICD-10-CM

## 2020-11-05 NOTE — Progress Notes (Deleted)
Office Visit Note  Patient: Yvette Mayer             Date of Birth: 1970/07/17           MRN: [id number]             PCP: No primary care provider on file. Referring: No ref. provider found Visit Date: 11/06/2020 Occupation: @GUAROCC @  Subjective:  No chief complaint on file.   History of Present Illness: Yvette Mayer is a 50 y.o. female ***   Activities of Daily Living:  Patient reports morning stiffness for *** {minute/hour:19697}.   Patient {ACTIONS;DENIES/REPORTS:21021675::"Denies"} nocturnal pain.  Difficulty dressing/grooming: {ACTIONS;DENIES/REPORTS:21021675::"Denies"} Difficulty climbing stairs: {ACTIONS;DENIES/REPORTS:21021675::"Denies"} Difficulty getting out of chair: {ACTIONS;DENIES/REPORTS:21021675::"Denies"} Difficulty using hands for taps, buttons, cutlery, and/or writing: {ACTIONS;DENIES/REPORTS:21021675::"Denies"}  No Rheumatology ROS completed.   PMFS History:  Patient Active Problem List   Diagnosis Date Noted  . Encounter for screening fecal occult blood testing 05/09/2020  . Encounter for well woman exam with routine gynecological exam 05/09/2020  . Irritable bowel syndrome with both constipation and diarrhea 05/09/2020  . Menopause 04/06/2019  . Sleep disturbance 04/06/2019  . Current use of estrogen therapy 04/06/2019  . Carpal tunnel syndrome 03/06/2019  . Sensation of fullness in both ears 03/01/2019  . Abdominal pain 03/02/2018  . History of UTI 11/29/2017  . Dyspareunia, female 11/29/2017  . Seasonal and perennial allergic rhinitis 10/12/2017  . Adverse food reaction 10/12/2017  . ANA positive Speckled 1:320 titer ENA negative  11/20/2016  . Leukopenia 11/11/2016  . History of bruising easily 09/08/2016  . Borderline high cholesterol 09/08/2016  . Situational mixed anxiety and depressive disorder 08/24/2016  . Rectocele 07/16/2016  . Kidney stone on right side 10/24/2015  . Hot flashes 04/10/2015  . Symptoms, such as flushing,  sleeplessness, headache, lack of concentration, associated with the menopause 04/10/2015  . 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 Constipation 12/26/2013  . Overweight (BMI 25.0-29.9) 12/26/2013  . Sjogren's syndrome ([place]) 08/09/2013  . Chronic insomnia 09/13/2012  . GERD (gastroesophageal reflux disease) 09/18/2011  . Anxiety 09/03/2011  . Hyperlipidemia 09/02/2011  . Fibromyalgia 09/02/2011  . Chronic nasal congestion 09/02/2011  . Vitamin D deficiency 09/02/2011  . Dysphagia 01/13/2011    Past Medical History:  Diagnosis Date  . Abdominal pain 06/12/2014  . Allergy   . Anemia    iron 1 yr ago, normal hgb 7/12  . Anxiety   . Asthma    pt says no-bronchitis  . Bloated abdomen 10/15/2015  . Chronic back pain   . Chronic headaches   . Chronic neck pain   . Chronic sinusitis   . Current use of estrogen therapy 04/16/2015  . Dyspareunia 09/12/2014  . Elevated TSH 04/16/2015  . Fatigue 04/10/2015  . Fibromyalgia   . Gas 10/15/2015  . GERD (gastroesophageal reflux disease)   . Herpes simplex virus (HSV) infection   . History of kidney stones   . Hot flashes 04/10/2015  . Hyperlipidemia   . Kidney stone on right side 10/24/2015  . Left-sided weakness   . Mental disorder   . Ovarian cyst, right   . Paresthesias   . Shortness of breath   . Sinus drainage   . Sjoegren syndrome   . Symptoms, such as flushing, sleeplessness, headache, lack of concentration, associated with the menopause 04/10/2015  . TMJ (dislocation of temporomandibular joint)   . Vaginal discharge 08/08/2015  . Vertigo   . Yeast infection 10/15/2015    Family History  Problem Relation  Age of Onset  . Hypertension Mother   . Thyroid disease Sister        overactive  . Asthma Sister   . Eczema Sister   . Bronchitis Son   . Heart disease Maternal Grandmother   . Hypertension Maternal Grandmother   . Alcohol abuse Maternal Grandfather   . Scoliosis Son   . Other Maternal Aunt        Sjeogren syndrome  . Other  Maternal Aunt        Sjoegren syndrome  . Other Maternal Aunt        Sjoegren syndrome  . Anesthesia problems Neg Hx   . Malignant hyperthermia Neg Hx   . Hypotension Neg Hx   . Pseudochol deficiency Neg Hx   . Colon cancer Neg Hx    Past Surgical History:  Procedure Laterality Date  . ABDOMINAL HYSTERECTOMY    . BIOPSY  09/09/2016   Procedure: BIOPSY;  Surgeon: Daneil Dolin, MD;  [facility];  Service: Endoscopy;;  esophageal  . BREAST CYST EXCISION Right 1980   benign  . CESAREAN SECTION     x2  . CHOLECYSTECTOMY  2005   cholelithiasis  . COLONOSCOPY N/A 09/09/2016   Procedure: COLONOSCOPY;  Surgeon: Daneil Dolin, MD;  [facility];  Service: Endoscopy;  Laterality: N/A;  8:30am  . ESOPHAGOGASTRODUODENOSCOPY  01/23/2011   TKW:[id number] undulating Z-line vs short segment Barrett s/p bx (NOT BARRETT's)/small HH otherwise normal  . ESOPHAGOGASTRODUODENOSCOPY N/A 09/09/2016   Procedure: ESOPHAGOGASTRODUODENOSCOPY (EGD);  Surgeon: Daneil Dolin, MD;  [facility];  Service: Endoscopy;  Laterality: N/A;  . ESOPHAGOGASTRODUODENOSCOPY (EGD) WITH PROPOFOL N/A 08/08/2020   Procedure: ESOPHAGOGASTRODUODENOSCOPY (EGD) WITH PROPOFOL;  Surgeon: Daneil Dolin, MD;  [facility];  Service: Endoscopy;  Laterality: N/A;  PM (ASA 3), pt tested + 2/7 <90 days  . MALONEY DILATION  01/23/2011   Procedure: Venia Minks DILATION;  Surgeon: Daneil Dolin, MD;  [facility];  Service: Endoscopy;  Laterality: N/A;  . Venia Minks DILATION N/A 09/09/2016   Procedure: Venia Minks DILATION;  Surgeon: Daneil Dolin, MD;  [facility];  Service: Endoscopy;  Laterality: N/A;  Venia Minks DILATION N/A 08/08/2020   Procedure: Venia Minks DILATION;  Surgeon: Daneil Dolin, MD;  [facility];  Service: Endoscopy;  Laterality: N/A;  . OVARIAN CYST REMOVAL  92 removal of cysts from behind ovaries  . PARTIAL HYSTERECTOMY  93  . SEPTOPLASTY     2011  .  TEMPOROMANDIBULAR JOINT SURGERY    . TONSILLECTOMY     2010  . TURBINATE RESECTION Bilateral 05/29/2013   Procedure: TURBINATE RESECTION;  Surgeon: Ascencion Dike, MD;  Location: [place];  Service: ENT;  Laterality: Bilateral;  . UPPER GASTROINTESTINAL ENDOSCOPY     Social History   Social History Narrative  . Not on file   Immunization History  Administered Date(s) Administered  . Tdap 09/18/2011     Objective: Vital Signs: There were no vitals taken for this visit.   Physical Exam   Musculoskeletal Exam: ***  CDAI Exam: CDAI Score: -- Patient Global: --; Provider Global: -- Swollen: --; Tender: -- Joint Exam 11/06/2020   No joint exam has been documented for this visit   There is currently no information documented on the homunculus. Go to the Rheumatology activity and complete the homunculus joint exam.  Investigation: No additional findings.  Imaging: No results found.  Recent  Labs: Lab Results  Component Value Date   WBC 3.8 (L) 08/07/2020   HGB 12.5 08/07/2020   PLT 294 08/07/2020   NA 140 08/09/2020   K 5.4 (H) 08/09/2020   CL 104 08/09/2020   CO2 29 08/09/2020   GLUCOSE 79 08/09/2020   BUN 8 08/09/2020   CREATININE 0.77 08/09/2020   BILITOT 0.4 08/09/2020   ALKPHOS 52 06/18/2020   AST 26 08/09/2020   ALT 22 08/09/2020   PROT 7.0 08/09/2020   ALBUMIN 3.4 (L) 06/18/2020   CALCIUM 9.5 08/09/2020   CALCIUM 9.5 08/09/2020   GFRAA 103 10/03/2019    Speciality Comments: No specialty comments available.  Procedures:  No procedures performed Allergies: Hydrocodone-acetaminophen, Percocet [oxycodone-acetaminophen], and Plaquenil [hydroxychloroquine sulfate]   Assessment / Plan:     Visit Diagnoses: No diagnosis found.  Orders: No orders of the defined types were placed in this encounter.  No orders of the defined types were placed in this encounter.   Face-to-face time spent with patient was *** minutes. Greater than 50% of  time was spent in counseling and coordination of care.  Follow-Up Instructions: No follow-ups on file.   Earnestine Mealing, CMA  Note - This record has been created using Editor, commissioning.  Chart creation errors have been sought, but may not always  have been located. Such creation errors do not reflect on  the standard of medical care.

## 2020-11-06 ENCOUNTER — Ambulatory Visit: Payer: BC Managed Care – PPO | Admitting: Rheumatology

## 2020-11-25 ENCOUNTER — Ambulatory Visit: Payer: BC Managed Care – PPO | Admitting: Adult Health

## 2020-11-28 ENCOUNTER — Other Ambulatory Visit (HOSPITAL_COMMUNITY): Payer: Self-pay | Admitting: Internal Medicine

## 2020-11-28 DIAGNOSIS — Z1231 Encounter for screening mammogram for malignant neoplasm of breast: Secondary | ICD-10-CM

## 2020-12-06 ENCOUNTER — Ambulatory Visit: Payer: BC Managed Care – PPO | Admitting: Adult Health

## 2020-12-10 DIAGNOSIS — M542 Cervicalgia: Secondary | ICD-10-CM | POA: Diagnosis not present

## 2020-12-10 DIAGNOSIS — M9902 Segmental and somatic dysfunction of thoracic region: Secondary | ICD-10-CM | POA: Diagnosis not present

## 2020-12-10 DIAGNOSIS — M9901 Segmental and somatic dysfunction of cervical region: Secondary | ICD-10-CM | POA: Diagnosis not present

## 2020-12-10 DIAGNOSIS — M546 Pain in thoracic spine: Secondary | ICD-10-CM | POA: Diagnosis not present

## 2020-12-13 NOTE — Progress Notes (Deleted)
NEUROLOGY CONSULTATION NOTE  Yvette Mayer MRN: [id number] DOB: 02-Jan-1971  Referring provider: Vic Blackbird, MD Primary care provider: Vic Blackbird, MD  Reason for consult:  headache and paresthesia  Assessment/Plan:   ***   Subjective:  Yvette Mayer is a 50 year old ***-handed female with chronic pain, chronic anxiety, TMJ dysfunction, GERD and Sjogren's who presents for headache and paresthesia.  History supplemented by referring provider's note.   Reports headaches since ***.  CT head and cervical spine on 03/19/2020 personally reviewed showed no acute intracranial abnormality and mild mid cervical degenerative disc disease.  She has know TMJ dysfunction and chronic problems with her teeth and face.  She ***  She reports numbness and tingling ***.  She has Sjogren's syndrome and fibromyalgia including chronic neck and back pain.    Current NSAIDs/analgesics:  ***  Past NSAIDs/analgesics:  meloxicam, naproxen, tramadol Past muscle relaxant:  Flexeril Past antidepressant:  Cymbalta, Lexapro, trazodone Past antiepileptic:  gabapentin    PAST MEDICAL HISTORY: Past Medical History:  Diagnosis Date   Abdominal pain 06/12/2014   Allergy    Anemia    iron 1 yr ago, normal hgb 7/12   Anxiety    Asthma    pt says no-bronchitis   Bloated abdomen 10/15/2015   Chronic back pain    Chronic headaches    Chronic neck pain    Chronic sinusitis    Current use of estrogen therapy 04/16/2015   Dyspareunia 09/12/2014   Elevated TSH 04/16/2015   Fatigue 04/10/2015   Fibromyalgia    Gas 10/15/2015   GERD (gastroesophageal reflux disease)    Herpes simplex virus (HSV) infection    History of kidney stones    Hot flashes 04/10/2015   Hyperlipidemia    Kidney stone on right side 10/24/2015   Left-sided weakness    Mental disorder    Ovarian cyst, right    Paresthesias    Shortness of breath    Sinus drainage    Sjoegren syndrome    Symptoms, such as flushing, sleeplessness,  headache, lack of concentration, associated with the menopause 04/10/2015   TMJ (dislocation of temporomandibular joint)    Vaginal discharge 08/08/2015   Vertigo    Yeast infection 10/15/2015    PAST SURGICAL HISTORY: Past Surgical History:  Procedure Laterality Date   ABDOMINAL HYSTERECTOMY     BIOPSY  09/09/2016   Procedure: BIOPSY;  Surgeon: Daneil Dolin, MD;  [facility];  Service: Endoscopy;;  esophageal   BREAST CYST EXCISION Right 1980   benign   CESAREAN SECTION     x2   CHOLECYSTECTOMY  2005   cholelithiasis   COLONOSCOPY N/A 09/09/2016   Procedure: COLONOSCOPY;  Surgeon: Daneil Dolin, MD;  [facility];  Service: Endoscopy;  Laterality: N/A;  8:30am   ESOPHAGOGASTRODUODENOSCOPY  01/23/2011   FKC:[id number] undulating Z-line vs short segment Barrett s/p bx (NOT BARRETT's)/small HH otherwise normal   ESOPHAGOGASTRODUODENOSCOPY N/A 09/09/2016   Procedure: ESOPHAGOGASTRODUODENOSCOPY (EGD);  Surgeon: Daneil Dolin, MD;  [facility];  Service: Endoscopy;  Laterality: N/A;   ESOPHAGOGASTRODUODENOSCOPY (EGD) WITH PROPOFOL N/A 08/08/2020   Procedure: ESOPHAGOGASTRODUODENOSCOPY (EGD) WITH PROPOFOL;  Surgeon: Daneil Dolin, MD;  [facility];  Service: Endoscopy;  Laterality: N/A;  PM (ASA 3), pt tested + 2/7 <90 days   MALONEY DILATION  01/23/2011   Procedure: MALONEY DILATION;  Surgeon: Daneil Dolin, MD;  [facility];  Service: Endoscopy;  Laterality: N/A;   MALONEY DILATION N/A 09/09/2016   Procedure: Venia Minks DILATION;  Surgeon: Daneil Dolin, MD;  [facility];  Service: Endoscopy;  Laterality: N/A;   MALONEY DILATION N/A 08/08/2020   Procedure: Venia Minks DILATION;  Surgeon: Daneil Dolin, MD;  [facility];  Service: Endoscopy;  Laterality: N/A;   OVARIAN CYST REMOVAL  92 removal of cysts from behind ovaries   PARTIAL HYSTERECTOMY  93   SEPTOPLASTY     2011   [place]     2010   TURBINATE RESECTION Bilateral 05/29/2013   Procedure: TURBINATE RESECTION;  Surgeon: Ascencion Dike, MD;  Location: [place];  Service: ENT;  Laterality: Bilateral;   UPPER GASTROINTESTINAL ENDOSCOPY      MEDICATIONS: Current Outpatient Medications on File Prior to Visit  Medication Sig Dispense Refill   cetirizine (ZYRTEC ALLERGY) 10 MG tablet Take 1 tablet (10 mg total) by mouth daily. 90 tablet 3   cyclobenzaprine (FLEXERIL) 5 MG tablet Take 1 tablet (5 mg total) by mouth at bedtime as needed for muscle spasms. (Patient not taking: Reported on 09/03/2020) 30 tablet 1   estradiol (ESTRACE) 2 MG tablet TAKE 1 TABLET BY MOUTH EVERY DAY (Patient taking differently: Take 2 mg by mouth daily.) 90 tablet 3   fluticasone (FLONASE) 50 MCG/ACT nasal spray Place 1 spray into both nostrils daily for 14 days. 16 g 0   guaiFENesin (ROBITUSSIN) 100 MG/5ML liquid Take 200 mg by mouth 3 (three) times daily as needed for cough.     LORazepam (ATIVAN) 0.5 MG tablet Take 1 tablet (0.5 mg total) by mouth 2 (two) times daily as needed for anxiety. 30 tablet 0   pantoprazole (PROTONIX) 40 MG tablet Take 40 mg by mouth daily.     sucralfate (CARAFATE) 1 g tablet Take 1 tablet (1 g total) by mouth 4 (four) times daily -  with meals and at bedtime. 120 tablet 1   VALERIAN ROOT PO Take 800 mg by mouth at bedtime. 400 mg each     No current facility-administered medications on file prior to visit.    ALLERGIES: Allergies  Allergen Reactions   Hydrocodone-Acetaminophen Itching   Percocet [Oxycodone-Acetaminophen] Itching    Transient itching, not happened since    Plaquenil [Hydroxychloroquine Sulfate] Rash    FAMILY HISTORY: Family History  Problem Relation Age of Onset   Hypertension Mother    Thyroid disease Sister        overactive   Asthma Sister    Eczema Sister    Bronchitis Son    Heart disease Maternal Grandmother    Hypertension Maternal Grandmother     Alcohol abuse Maternal Grandfather    Scoliosis Son    Other Maternal Aunt        Sjeogren syndrome   Other Maternal Aunt        Sjoegren syndrome   Other Maternal Aunt        Sjoegren syndrome   Anesthesia problems Neg Hx    Malignant hyperthermia Neg Hx    Hypotension Neg Hx    Pseudochol deficiency Neg Hx    Colon cancer Neg Hx     Objective:  *** General: No acute distress.  Patient appears well-groomed.   Head:  Normocephalic/atraumatic Eyes:  fundi examined but not visualized Neck: supple, no paraspinal tenderness, full range of motion Back: No paraspinal tenderness Heart: regular rate and rhythm Lungs: Clear to auscultation  bilaterally. Vascular: No carotid bruits. Neurological Exam: Mental status: alert and oriented to person, place, and time, recent and remote memory intact, fund of knowledge intact, attention and concentration intact, speech fluent and not dysarthric, language intact. Cranial nerves: CN I: not tested CN II: pupils equal, round and reactive to light, visual fields intact CN III, IV, VI:  full range of motion, no nystagmus, no ptosis CN V: facial sensation intact. CN VII: upper and lower face symmetric CN VIII: hearing intact CN IX, X: gag intact, uvula midline CN XI: sternocleidomastoid and trapezius muscles intact CN XII: tongue midline Bulk & Tone: normal, no fasciculations. Motor:  muscle strength 5/5 throughout Sensation:  Pinprick, temperature and vibratory sensation intact. Deep Tendon Reflexes:  2+ throughout,  toes downgoing.   Finger to nose testing:  Without dysmetria.   Heel to shin:  Without dysmetria.   Gait:  Normal station and stride.  Romberg negative.    Thank you for allowing me to take part in the care of this patient.  Metta Clines, DO  CC: ***

## 2020-12-16 ENCOUNTER — Ambulatory Visit: Payer: BC Managed Care – PPO | Admitting: Neurology

## 2020-12-23 DIAGNOSIS — N951 Menopausal and female climacteric states: Secondary | ICD-10-CM | POA: Diagnosis not present

## 2020-12-23 DIAGNOSIS — R635 Abnormal weight gain: Secondary | ICD-10-CM | POA: Diagnosis not present

## 2020-12-25 ENCOUNTER — Ambulatory Visit: Payer: BC Managed Care – PPO | Admitting: Adult Health

## 2020-12-25 DIAGNOSIS — Z1331 Encounter for screening for depression: Secondary | ICD-10-CM | POA: Diagnosis not present

## 2020-12-25 DIAGNOSIS — N951 Menopausal and female climacteric states: Secondary | ICD-10-CM | POA: Diagnosis not present

## 2020-12-25 DIAGNOSIS — Z6828 Body mass index (BMI) 28.0-28.9, adult: Secondary | ICD-10-CM | POA: Diagnosis not present

## 2020-12-25 DIAGNOSIS — R635 Abnormal weight gain: Secondary | ICD-10-CM | POA: Diagnosis not present

## 2020-12-25 DIAGNOSIS — Z1339 Encounter for screening examination for other mental health and behavioral disorders: Secondary | ICD-10-CM | POA: Diagnosis not present

## 2021-01-02 ENCOUNTER — Inpatient Hospital Stay (HOSPITAL_COMMUNITY): Admission: RE | Admit: 2021-01-02 | Payer: BC Managed Care – PPO | Source: Ambulatory Visit

## 2021-01-22 ENCOUNTER — Ambulatory Visit (HOSPITAL_COMMUNITY)
Admission: RE | Admit: 2021-01-22 | Discharge: 2021-01-22 | Disposition: A | Discharge status: Home / Self Care | Payer: BC Managed Care – PPO | Source: Ambulatory Visit | Attending: Internal Medicine | Admitting: Internal Medicine

## 2021-01-22 ENCOUNTER — Other Ambulatory Visit: Payer: Self-pay

## 2021-01-22 DIAGNOSIS — Z1231 Encounter for screening mammogram for malignant neoplasm of breast: Secondary | ICD-10-CM | POA: Diagnosis not present

## 2021-01-27 DIAGNOSIS — M542 Cervicalgia: Secondary | ICD-10-CM | POA: Diagnosis not present

## 2021-01-27 DIAGNOSIS — R6884 Jaw pain: Secondary | ICD-10-CM | POA: Diagnosis not present

## 2021-01-27 DIAGNOSIS — M545 Low back pain, unspecified: Secondary | ICD-10-CM | POA: Diagnosis not present

## 2021-01-27 DIAGNOSIS — R51 Headache with orthostatic component, not elsewhere classified: Secondary | ICD-10-CM | POA: Diagnosis not present

## 2021-01-29 ENCOUNTER — Telehealth: Payer: Self-pay | Admitting: Internal Medicine

## 2021-01-29 MED ORDER — PANTOPRAZOLE SODIUM 40 MG PO TBEC: 40.0000 mg | DELAYED_RELEASE_TABLET | Freq: Every day | ORAL | 3 refills | Status: DC

## 2021-01-29 NOTE — Telephone Encounter (Signed)
Refill request received for pantoprazole sod dr 40 mg tab. To be sent to [facility].

## 2021-01-29 NOTE — Addendum Note (Signed)
Addended by: Mahala Menghini on: 01/29/2021 07:42 PM   Modules accepted: Orders

## 2021-02-12 ENCOUNTER — Ambulatory Visit: Payer: BC Managed Care – PPO | Admitting: Adult Health

## 2021-02-13 ENCOUNTER — Other Ambulatory Visit: Payer: Self-pay | Admitting: Adult Health

## 2021-02-18 NOTE — Progress Notes (Deleted)
Office Visit Note  Patient: Yvette Mayer             Date of Birth: 08-23-1970           MRN: [id number]             PCP: Celene Squibb, MD Referring: No ref. provider found Visit Date: 03/04/2021 Occupation: @GUAROCC @  Subjective:  No chief complaint on file.   History of Present Illness: Meliss Fleek Mayer is a 50 y.o. female ***   Activities of Daily Living:  Patient reports morning stiffness for *** {minute/hour:19697}.   Patient {ACTIONS;DENIES/REPORTS:21021675::"Denies"} nocturnal pain.  Difficulty dressing/grooming: {ACTIONS;DENIES/REPORTS:21021675::"Denies"} Difficulty climbing stairs: {ACTIONS;DENIES/REPORTS:21021675::"Denies"} Difficulty getting out of chair: {ACTIONS;DENIES/REPORTS:21021675::"Denies"} Difficulty using hands for taps, buttons, cutlery, and/or writing: {ACTIONS;DENIES/REPORTS:21021675::"Denies"}  No Rheumatology ROS completed.   PMFS History:  Patient Active Problem List   Diagnosis Date Noted   Encounter for screening fecal occult blood testing 05/09/2020   Encounter for well woman exam with routine gynecological exam 05/09/2020   Irritable bowel syndrome with both constipation and diarrhea 05/09/2020   Menopause 04/06/2019   Sleep disturbance 04/06/2019   Current use of estrogen therapy 04/06/2019   Carpal tunnel syndrome 03/06/2019   Sensation of fullness in both ears 03/01/2019   Abdominal pain 03/02/2018   History of UTI 11/29/2017   Dyspareunia, female 11/29/2017   Seasonal and perennial allergic rhinitis 10/12/2017   Adverse food reaction 10/12/2017   ANA positive Speckled 1:320 titer ENA negative  11/20/2016   Leukopenia 11/11/2016   History of bruising easily 09/08/2016   Borderline high cholesterol 09/08/2016   Situational mixed anxiety and depressive disorder 08/24/2016   Rectocele 07/16/2016   Kidney stone on right side 10/24/2015   Hot flashes 04/10/2015   Symptoms, such as flushing, sleeplessness, headache, lack of concentration,  associated with the menopause 04/10/2015   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/2015   Constipation 12/26/2013   Overweight (BMI 25.0-29.9) 12/26/2013   Sjogren's syndrome ([place]) 08/09/2013   Chronic insomnia 09/13/2012   GERD (gastroesophageal reflux disease) 09/18/2011   Anxiety 09/03/2011   Hyperlipidemia 09/02/2011   Fibromyalgia 09/02/2011   Chronic nasal congestion 09/02/2011   Vitamin D deficiency 09/02/2011   Dysphagia 01/13/2011    Past Medical History:  Diagnosis Date   Abdominal pain 06/12/2014   Allergy    Anemia    iron 1 yr ago, normal hgb 7/12   Anxiety    Asthma    pt says no-bronchitis   Bloated abdomen 10/15/2015   Chronic back pain    Chronic headaches    Chronic neck pain    Chronic sinusitis    Current use of estrogen therapy 04/16/2015   Dyspareunia 09/12/2014   Elevated TSH 04/16/2015   Fatigue 04/10/2015   Fibromyalgia    Gas 10/15/2015   GERD (gastroesophageal reflux disease)    Herpes simplex virus (HSV) infection    History of kidney stones    Hot flashes 04/10/2015   Hyperlipidemia    Kidney stone on right side 10/24/2015   Left-sided weakness    Mental disorder    Ovarian cyst, right    Paresthesias    Shortness of breath    Sinus drainage    Sjoegren syndrome    Symptoms, such as flushing, sleeplessness, headache, lack of concentration, associated with the menopause 04/10/2015   TMJ (dislocation of temporomandibular joint)    Vaginal discharge 08/08/2015   Vertigo    Yeast infection 10/15/2015    Family History  Problem Relation Age of  Onset   Hypertension Mother    Thyroid disease Sister        overactive   Asthma Sister    Eczema Sister    Bronchitis Son    Heart disease Maternal Grandmother    Hypertension Maternal Grandmother    Alcohol abuse Maternal Grandfather    Scoliosis Son    Other Maternal Aunt        Sjeogren syndrome   Other Maternal Aunt        Sjoegren syndrome   Other Maternal Aunt        Sjoegren syndrome   Anesthesia  problems Neg Hx    Malignant hyperthermia Neg Hx    Hypotension Neg Hx    Pseudochol deficiency Neg Hx    Colon cancer Neg Hx    Past Surgical History:  Procedure Laterality Date   ABDOMINAL HYSTERECTOMY     BIOPSY  09/09/2016   Procedure: BIOPSY;  Surgeon: Daneil Dolin, MD;  [facility];  Service: Endoscopy;;  esophageal   BREAST CYST EXCISION Right 1980   benign   CESAREAN SECTION     x2   CHOLECYSTECTOMY  2005   cholelithiasis   COLONOSCOPY N/A 09/09/2016   Procedure: COLONOSCOPY;  Surgeon: Daneil Dolin, MD;  [facility];  Service: Endoscopy;  Laterality: N/A;  8:30am   ESOPHAGOGASTRODUODENOSCOPY  01/23/2011   QZE:[id number] undulating Z-line vs short segment Barrett s/p bx (NOT BARRETT's)/small HH otherwise normal   ESOPHAGOGASTRODUODENOSCOPY N/A 09/09/2016   Procedure: ESOPHAGOGASTRODUODENOSCOPY (EGD);  Surgeon: Daneil Dolin, MD;  [facility];  Service: Endoscopy;  Laterality: N/A;   ESOPHAGOGASTRODUODENOSCOPY (EGD) WITH PROPOFOL N/A 08/08/2020   Procedure: ESOPHAGOGASTRODUODENOSCOPY (EGD) WITH PROPOFOL;  Surgeon: Daneil Dolin, MD;  [facility];  Service: Endoscopy;  Laterality: N/A;  PM (ASA 3), pt tested + 2/7 <90 days   MALONEY DILATION  01/23/2011   Procedure: MALONEY DILATION;  Surgeon: Daneil Dolin, MD;  [facility];  Service: Endoscopy;  Laterality: N/A;   MALONEY DILATION N/A 09/09/2016   Procedure: Venia Minks DILATION;  Surgeon: Daneil Dolin, MD;  [facility];  Service: Endoscopy;  Laterality: N/A;   MALONEY DILATION N/A 08/08/2020   Procedure: Venia Minks DILATION;  Surgeon: Daneil Dolin, MD;  [facility];  Service: Endoscopy;  Laterality: N/A;   OVARIAN CYST REMOVAL  92 removal of cysts from behind ovaries   PARTIAL HYSTERECTOMY  93   SEPTOPLASTY     2011   [place]     2010   TURBINATE RESECTION Bilateral 05/29/2013   Procedure:  TURBINATE RESECTION;  Surgeon: Ascencion Dike, MD;  Location: [facility];  Service: ENT;  Laterality: Bilateral;   UPPER GASTROINTESTINAL ENDOSCOPY     Social History   Social History Narrative   Not on file   Immunization History  Administered Date(s) Administered   Tdap 09/18/2011     Objective: Vital Signs: There were no vitals taken for this visit.   Physical Exam   Musculoskeletal Exam: ***  CDAI Exam: CDAI Score: -- Patient Global: --; Provider Global: -- Swollen: --; Tender: -- Joint Exam 03/04/2021   No joint exam has been documented for this visit   There is currently no information documented on the homunculus. Go to the Rheumatology activity and complete the homunculus joint exam.  Investigation: No additional findings.  Imaging: MM 3D SCREEN BREAST BILATERAL  Result  Date: 01/24/2021 CLINICAL DATA:  Screening. EXAM: DIGITAL SCREENING BILATERAL MAMMOGRAM WITH TOMOSYNTHESIS AND CAD TECHNIQUE: Bilateral screening digital craniocaudal and mediolateral oblique mammograms were obtained. Bilateral screening digital breast tomosynthesis was performed. The images were evaluated with computer-aided detection. COMPARISON:  Previous exam(s). ACR Breast Density Category b: There are scattered areas of fibroglandular density. FINDINGS: There are no findings suspicious for malignancy. IMPRESSION: No mammographic evidence of malignancy. A result letter of this screening mammogram will be mailed directly to the patient. RECOMMENDATION: Screening mammogram in one year. (Code:SM-B-01Y) BI-RADS CATEGORY  1: Negative. Electronically Signed   By: Lillia Mountain M.D.   On: 01/24/2021 11:13    Recent Labs: Lab Results  Component Value Date   WBC 3.8 (L) 08/07/2020   HGB 12.5 08/07/2020   PLT 294 08/07/2020   NA 140 08/09/2020   K 5.4 (H) 08/09/2020   CL 104 08/09/2020   CO2 29 08/09/2020   GLUCOSE 79 08/09/2020   BUN 8 08/09/2020   CREATININE 0.77 08/09/2020   BILITOT  0.4 08/09/2020   ALKPHOS 52 06/18/2020   AST 26 08/09/2020   ALT 22 08/09/2020   PROT 7.0 08/09/2020   ALBUMIN 3.4 (L) 06/18/2020   CALCIUM 9.5 08/09/2020   CALCIUM 9.5 08/09/2020   GFRAA 103 10/03/2019    Speciality Comments: No specialty comments available.  Procedures:  No procedures performed Allergies: Hydrocodone-acetaminophen, Percocet [oxycodone-acetaminophen], and Plaquenil [hydroxychloroquine sulfate]   Assessment / Plan:     Visit Diagnoses: No diagnosis found.  Orders: No orders of the defined types were placed in this encounter.  No orders of the defined types were placed in this encounter.   Face-to-face time spent with patient was *** minutes. Greater than 50% of time was spent in counseling and coordination of care.  Follow-Up Instructions: No follow-ups on file.   Earnestine Mealing, CMA  Note - This record has been created using Editor, commissioning.  Chart creation errors have been sought, but may not always  have been located. Such creation errors do not reflect on  the standard of medical care.

## 2021-02-27 ENCOUNTER — Ambulatory Visit: Payer: BC Managed Care – PPO | Admitting: Adult Health

## 2021-02-27 DIAGNOSIS — J301 Allergic rhinitis due to pollen: Secondary | ICD-10-CM | POA: Diagnosis not present

## 2021-02-27 DIAGNOSIS — Z20822 Contact with and (suspected) exposure to covid-19: Secondary | ICD-10-CM | POA: Diagnosis not present

## 2021-02-28 ENCOUNTER — Telehealth: Payer: Self-pay | Admitting: Internal Medicine

## 2021-02-28 MED ORDER — PANTOPRAZOLE SODIUM 40 MG PO TBEC: 40.0000 mg | DELAYED_RELEASE_TABLET | Freq: Every day | ORAL | 3 refills | Status: AC

## 2021-02-28 NOTE — Telephone Encounter (Signed)
Pt's last office visit was 07/30/2020.

## 2021-02-28 NOTE — Telephone Encounter (Signed)
Pt called to make FU OV with RMR and is aware of date. She also said she was completely out of her reflux medicine and needed a refill called into [facility] in [place].

## 2021-02-28 NOTE — Addendum Note (Signed)
Addended by: Mahala Menghini on: 02/28/2021 12:03 PM   Modules accepted: Orders

## 2021-03-04 ENCOUNTER — Ambulatory Visit: Payer: BC Managed Care – PPO | Admitting: Rheumatology

## 2021-03-04 DIAGNOSIS — G4711 Idiopathic hypersomnia with long sleep time: Secondary | ICD-10-CM

## 2021-03-04 DIAGNOSIS — F419 Anxiety disorder, unspecified: Secondary | ICD-10-CM

## 2021-03-04 DIAGNOSIS — M797 Fibromyalgia: Secondary | ICD-10-CM

## 2021-03-04 DIAGNOSIS — R768 Other specified abnormal immunological findings in serum: Secondary | ICD-10-CM

## 2021-03-04 DIAGNOSIS — Z8719 Personal history of other diseases of the digestive system: Secondary | ICD-10-CM

## 2021-03-04 DIAGNOSIS — R5383 Other fatigue: Secondary | ICD-10-CM

## 2021-03-04 DIAGNOSIS — Z8639 Personal history of other endocrine, nutritional and metabolic disease: Secondary | ICD-10-CM

## 2021-03-04 DIAGNOSIS — R748 Abnormal levels of other serum enzymes: Secondary | ICD-10-CM

## 2021-03-04 DIAGNOSIS — F32A Depression, unspecified: Secondary | ICD-10-CM

## 2021-03-04 DIAGNOSIS — M3500 Sicca syndrome, unspecified: Secondary | ICD-10-CM

## 2021-03-11 ENCOUNTER — Encounter: Payer: Self-pay | Admitting: Adult Health

## 2021-03-11 ENCOUNTER — Other Ambulatory Visit: Payer: Self-pay

## 2021-03-11 ENCOUNTER — Ambulatory Visit: Payer: BC Managed Care – PPO | Admitting: Adult Health

## 2021-03-11 ENCOUNTER — Other Ambulatory Visit (HOSPITAL_COMMUNITY)
Admission: RE | Admit: 2021-03-11 | Discharge: 2021-03-11 | Disposition: A | Discharge status: Home / Self Care | Payer: BC Managed Care – PPO | Source: Ambulatory Visit | Attending: Adult Health | Admitting: Adult Health

## 2021-03-11 VITALS — BP 104/65 | HR 60 | Ht 65.0 in | Wt 180.0 lb

## 2021-03-11 DIAGNOSIS — Z113 Encounter for screening for infections with a predominantly sexual mode of transmission: Secondary | ICD-10-CM

## 2021-03-11 DIAGNOSIS — N816 Rectocele: Secondary | ICD-10-CM | POA: Diagnosis not present

## 2021-03-11 DIAGNOSIS — N898 Other specified noninflammatory disorders of vagina: Secondary | ICD-10-CM | POA: Insufficient documentation

## 2021-03-11 NOTE — Progress Notes (Signed)
  Subjective:     [name], female   DOB: 1970-11-25, 50 y.o.   MRN: [id number]  HPI Yvette Mayer is a 50 year old black female, divorced, sp hysterectomy in complaining of vaginal discharge and requests STD testing.  PCP is Dr Nevada Crane  Review of Systems +vaginal discharge +constipation +hard spot on scalp Reviewed past medical,surgical, social and family history. Reviewed medications and allergies.     Objective:   Physical Exam BP 104/65 (BP Location: Left Arm, Patient Position: Sitting, Cuff Size: Normal)   Pulse 60   Ht 5\' 5"  (1.651 m)   Wt 180 lb (81.6 kg)   BMI 29.95 kg/m     Skin warm and dry.Pelvic: external genitalia is normal in appearance no lesions, vagina: creamy discharge without odor,CV swab obtained,urethra has no lesions or masses noted, cervix and uterus are absent,adnexa: no masses or tenderness noted. Bladder is non tender and no masses felt. On rectal exam has no masses, +low rectocele. Has 3 mm hard area, left side of head, where had cyst lanced, follow up with that doctor. Fall risk is low  [facility] - 03/11/21 1608       Pregnancy Intention Screening   Does the patient want to become pregnant in the next year? N/A    Does the patient's partner want to become pregnant in the next year? N/A    Would the patient like to discuss contraceptive options today? N/A      Contraception Wrap Up   Current Method Female Sterilization   hyst   End Method Female Sterilization    Contraception Counseling Provided No            Examination chaperoned by Engineer, materials  Assessment:     1. Vaginal discharge CV swab sent   2. Rectocele  3. Screening examination for STD (sexually transmitted disease) CV swab sent for GC/CHL,trich ,BV and yeast Check HIV and RPR Plan:     Follow up prn

## 2021-03-12 LAB — RPR: RPR Ser Ql: NONREACTIVE

## 2021-03-12 LAB — HIV ANTIBODY (ROUTINE TESTING W REFLEX): HIV Screen 4th Generation wRfx: NONREACTIVE

## 2021-03-13 ENCOUNTER — Other Ambulatory Visit: Payer: Self-pay | Admitting: Adult Health

## 2021-03-13 LAB — CERVICOVAGINAL ANCILLARY ONLY
Bacterial Vaginitis (gardnerella): POSITIVE — AB
Candida Glabrata: NEGATIVE
Candida Vaginitis: NEGATIVE
Chlamydia: NEGATIVE
Comment: NEGATIVE
Comment: NEGATIVE
Comment: NEGATIVE
Comment: NEGATIVE
Comment: NEGATIVE
Comment: NORMAL
Neisseria Gonorrhea: NEGATIVE
Trichomonas: NEGATIVE

## 2021-03-13 MED ORDER — METRONIDAZOLE 500 MG PO TABS
500.0000 mg | ORAL_TABLET | Freq: Two times a day (BID) | ORAL | 0 refills | Status: DC
Start: 2021-03-13 — End: 2021-07-25

## 2021-03-13 NOTE — Progress Notes (Signed)
+  BV on vaginal swab will rx flagyl 

## 2021-04-15 ENCOUNTER — Ambulatory Visit: Payer: BC Managed Care – PPO | Admitting: Internal Medicine

## 2021-04-15 ENCOUNTER — Encounter: Payer: Self-pay | Admitting: Internal Medicine

## 2021-04-29 ENCOUNTER — Encounter (HOSPITAL_COMMUNITY): Payer: Self-pay | Admitting: Emergency Medicine

## 2021-04-29 ENCOUNTER — Other Ambulatory Visit: Payer: Self-pay

## 2021-04-29 ENCOUNTER — Emergency Department (HOSPITAL_COMMUNITY)
Admission: EM | Admit: 2021-04-29 | Discharge: 2021-04-29 | Disposition: A | Discharge status: Home / Self Care | Payer: BC Managed Care – PPO | Attending: Emergency Medicine | Admitting: Emergency Medicine

## 2021-04-29 DIAGNOSIS — J111 Influenza due to unidentified influenza virus with other respiratory manifestations: Secondary | ICD-10-CM | POA: Insufficient documentation

## 2021-04-29 DIAGNOSIS — J45909 Unspecified asthma, uncomplicated: Secondary | ICD-10-CM | POA: Diagnosis not present

## 2021-04-29 DIAGNOSIS — R509 Fever, unspecified: Secondary | ICD-10-CM | POA: Diagnosis not present

## 2021-04-29 MED ORDER — KETOROLAC TROMETHAMINE 60 MG/2ML IM SOLN
60.0000 mg | Freq: Once | INTRAMUSCULAR | Status: AC
  Administered 2021-04-29: 60 mg via INTRAMUSCULAR

## 2021-04-29 MED ORDER — OSELTAMIVIR PHOSPHATE 75 MG PO CAPS: 75.0000 mg | ORAL_CAPSULE | Freq: Two times a day (BID) | ORAL | 0 refills | Status: DC

## 2021-04-29 MED ORDER — BENZONATATE 100 MG PO CAPS: 100.0000 mg | ORAL_CAPSULE | Freq: Three times a day (TID) | ORAL | 0 refills | Status: DC

## 2021-04-29 MED ORDER — OSELTAMIVIR PHOSPHATE 75 MG PO CAPS
75.0000 mg | ORAL_CAPSULE | Freq: Once | ORAL | Status: AC
  Administered 2021-04-29: 75 mg via ORAL

## 2021-04-29 NOTE — ED Triage Notes (Signed)
Pt c/o fever, headache, left ear pain, and body aches. Pt states she took Nyquil earlier tonight.

## 2021-04-29 NOTE — ED Provider Notes (Signed)
[facility] Provider Note   CSN: [id number] Arrival date & time: 04/29/21  0308     History Chief Complaint  Patient presents with   Flu-like Symptoms    Yvette Mayer is a 50 y.o. female.  Patient presents to the emergency department for evaluation of fever, chills, headache, cough, congestion with body aches.  Symptoms present for 2 days.      Past Medical History:  Diagnosis Date   Abdominal pain 06/12/2014   Allergy    Anemia    iron 1 yr ago, normal hgb 7/12   Anxiety    Asthma    pt says no-bronchitis   Bloated abdomen 10/15/2015   Chronic back pain    Chronic headaches    Chronic neck pain    Chronic sinusitis    Current use of estrogen therapy 04/16/2015   Dyspareunia 09/12/2014   Elevated TSH 04/16/2015   Fatigue 04/10/2015   Fibromyalgia    Gas 10/15/2015   GERD (gastroesophageal reflux disease)    Herpes simplex virus (HSV) infection    History of kidney stones    Hot flashes 04/10/2015   Hyperlipidemia    Kidney stone on right side 10/24/2015   Left-sided weakness    Mental disorder    Ovarian cyst, right    Paresthesias    Shortness of breath    Sinus drainage    Sjoegren syndrome    Symptoms, such as flushing, sleeplessness, headache, lack of concentration, associated with the menopause 04/10/2015   TMJ (dislocation of temporomandibular joint)    Vaginal discharge 08/08/2015   Vertigo    Yeast infection 10/15/2015    Patient Active Problem List   Diagnosis Date Noted   Screening examination for STD (sexually transmitted disease) 03/11/2021   Encounter for screening fecal occult blood testing 05/09/2020   Encounter for well woman exam with routine gynecological exam 05/09/2020   Irritable bowel syndrome with both constipation and diarrhea 05/09/2020   Menopause 04/06/2019   Sleep disturbance 04/06/2019   Current use of estrogen therapy 04/06/2019   Carpal tunnel syndrome 03/06/2019   Sensation of fullness in both ears  03/01/2019   Abdominal pain 03/02/2018   History of UTI 11/29/2017   Dyspareunia, female 11/29/2017   Seasonal and perennial allergic rhinitis 10/12/2017   Adverse food reaction 10/12/2017   ANA positive Speckled 1:320 titer ENA negative  11/20/2016   Leukopenia 11/11/2016   History of bruising easily 09/08/2016   Borderline high cholesterol 09/08/2016   Situational mixed anxiety and depressive disorder 08/24/2016   Rectocele 07/16/2016   Kidney stone on right side 10/24/2015   Vaginal discharge 08/08/2015   Hot flashes 04/10/2015   Symptoms, such as flushing, sleeplessness, headache, lack of concentration, associated with the menopause 04/10/2015   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/2015   Constipation 12/26/2013   Overweight (BMI 25.0-29.9) 12/26/2013   Sjogren's syndrome ([place]) 08/09/2013   Chronic insomnia 09/13/2012   GERD (gastroesophageal reflux disease) 09/18/2011   Anxiety 09/03/2011   Hyperlipidemia 09/02/2011   Fibromyalgia 09/02/2011   Chronic nasal congestion 09/02/2011   Vitamin D deficiency 09/02/2011   Dysphagia 01/13/2011    Past Surgical History:  Procedure Laterality Date   ABDOMINAL HYSTERECTOMY     BIOPSY  09/09/2016   Procedure: BIOPSY;  Surgeon: Daneil Dolin, MD;  [facility];  Service: Endoscopy;;  esophageal   BREAST CYST EXCISION Right 1980   benign   CESAREAN SECTION     x2   CHOLECYSTECTOMY  2005   cholelithiasis   COLONOSCOPY N/A 09/09/2016   Procedure: COLONOSCOPY;  Surgeon: Daneil Dolin, MD;  [facility];  Service: Endoscopy;  Laterality: N/A;  8:30am   ESOPHAGOGASTRODUODENOSCOPY  01/23/2011   CHE:NIDPOEUMPNT undulating Z-line vs short segment Barrett s/p bx (NOT BARRETT's)/small HH otherwise normal   ESOPHAGOGASTRODUODENOSCOPY N/A 09/09/2016   Procedure: ESOPHAGOGASTRODUODENOSCOPY (EGD);  Surgeon: Daneil Dolin, MD;  [facility];  Service: Endoscopy;  Laterality: N/A;   ESOPHAGOGASTRODUODENOSCOPY (EGD) WITH PROPOFOL  N/A 08/08/2020   Procedure: ESOPHAGOGASTRODUODENOSCOPY (EGD) WITH PROPOFOL;  Surgeon: Daneil Dolin, MD;  [facility];  Service: Endoscopy;  Laterality: N/A;  PM (ASA 3), pt tested + 2/7 <90 days   MALONEY DILATION  01/23/2011   Procedure: MALONEY DILATION;  Surgeon: Daneil Dolin, MD;  [facility];  Service: Endoscopy;  Laterality: N/A;   MALONEY DILATION N/A 09/09/2016   Procedure: Venia Minks DILATION;  Surgeon: Daneil Dolin, MD;  [facility];  Service: Endoscopy;  Laterality: N/A;   MALONEY DILATION N/A 08/08/2020   Procedure: Venia Minks DILATION;  Surgeon: Daneil Dolin, MD;  [facility];  Service: Endoscopy;  Laterality: N/A;   OVARIAN CYST REMOVAL  92 removal of cysts from behind ovaries   PARTIAL HYSTERECTOMY  93   SEPTOPLASTY     2011   [place]     2010   TURBINATE RESECTION Bilateral 05/29/2013   Procedure: TURBINATE RESECTION;  Surgeon: Ascencion Dike, MD;  Location: [facility];  Service: ENT;  Laterality: Bilateral;   UPPER GASTROINTESTINAL ENDOSCOPY       OB History     Gravida  4   Para  3   Term  2   Preterm  1   AB  1   Living  3      SAB  1   IAB      Ectopic      Multiple      Live Births  3           Family History  Problem Relation Age of Onset   Hypertension Mother    Thyroid disease Sister        overactive   Asthma Sister    Eczema Sister    Bronchitis Son    Heart disease Maternal Grandmother    Hypertension Maternal Grandmother    Alcohol abuse Maternal Grandfather    Scoliosis Son    Other Maternal Aunt        Sjeogren syndrome   Other Maternal Aunt        Sjoegren syndrome   Other Maternal Aunt        Sjoegren syndrome   Anesthesia problems Neg Hx    Malignant hyperthermia Neg Hx    Hypotension Neg Hx    Pseudochol deficiency Neg Hx    Colon cancer Neg Hx     Social History   Tobacco Use   Smoking status: Never    Smokeless tobacco: Never  Vaping Use   Vaping Use: Never used  Substance Use Topics   Alcohol use: No   Drug use: No    Home Medications Prior to Admission medications   Medication Sig Start Date End Date Taking? Authorizing Provider  benzonatate (TESSALON) 100 MG capsule Take 1 capsule (100 mg total) by mouth every 8 (eight) hours. 04/29/21  Yes Yetunde Leis, Gwenyth Allegra, MD  oseltamivir (TAMIFLU) 75 MG  capsule Take 1 capsule (75 mg total) by mouth every 12 (twelve) hours. 04/29/21  Yes Kena Limon, Gwenyth Allegra, MD  ARMOUR THYROID 30 MG tablet Take 30 mg by mouth daily. 12/25/20   [provider]  cefdinir (OMNICEF) 300 MG capsule Take 300 mg by mouth every 12 (twelve) hours. 03/05/21   [provider]  cetirizine (ZYRTEC ALLERGY) 10 MG tablet Take 1 tablet (10 mg total) by mouth daily. 09/03/20   Eulogio Bear, NP  [facility] DIGESTIVE PROBIOTIC 250 MG capsule Take 250 mg by mouth 2 (two) times daily. 03/05/21   [provider]  cyclobenzaprine (FLEXERIL) 5 MG tablet Take 1 tablet (5 mg total) by mouth at bedtime as needed for muscle spasms. 06/05/20   Alycia Rossetti, MD  fluticasone [facility]) 50 MCG/ACT nasal spray Place 1 spray into both nostrils daily for 14 days. 07/08/20 03/11/21  Avegno, Darrelyn Hillock, FNP  guaiFENesin (ROBITUSSIN) 100 MG/5ML liquid Take 200 mg by mouth 3 (three) times daily as needed for cough.    [provider]  LORazepam (ATIVAN) 0.5 MG tablet Take 1 tablet (0.5 mg total) by mouth 2 (two) times daily as needed for anxiety. 10/04/20   Eulogio Bear, NP  metroNIDAZOLE (FLAGYL) 500 MG tablet Take 1 tablet (500 mg total) by mouth 2 (two) times daily. 03/13/21   Estill Dooms, NP  pantoprazole (PROTONIX) 40 MG tablet Take 1 tablet (40 mg total) by mouth daily. 02/28/21   Mahala Menghini, PA-C  sucralfate (CARAFATE) 1 g tablet Take 1 tablet (1 g total) by mouth 4 (four) times daily -  with meals and at bedtime. 07/30/20 08/29/20  Rourk, Cristopher Estimable, MD    Allergies    Hydrocodone-acetaminophen, Percocet [oxycodone-acetaminophen], and Plaquenil [hydroxychloroquine sulfate]  Review of Systems   Review of Systems  Constitutional:  Positive for chills and fever.  HENT:  Positive for congestion.   Respiratory:  Positive for cough.   Musculoskeletal:  Positive for myalgias.  Neurological:  Positive for headaches.  All other systems reviewed and are negative.  Physical Exam Updated Vital Signs BP 110/76 (BP Location: Right Arm)   Pulse 83   Temp 98.7 F (37.1 C) (Oral)   Resp 16   Ht 5\' 5"  (1.651 m)   Wt 81.6 kg   SpO2 95%   BMI 29.94 kg/m   Physical Exam Vitals and nursing note reviewed.  Constitutional:      General: She is not in acute distress.    Appearance: Normal appearance. She is well-developed.  HENT:     Head: Normocephalic and atraumatic.     Right Ear: Hearing and tympanic membrane normal.     Left Ear: Hearing and tympanic membrane normal.     Nose: Nose normal.  Eyes:     Conjunctiva/sclera: Conjunctivae normal.     Pupils: Pupils are equal, round, and reactive to light.  Neck:     Meningeal: Brudzinski's sign and Kernig's sign absent.  Cardiovascular:     Rate and Rhythm: Regular rhythm.     Heart sounds: S1 normal and S2 normal. No murmur heard.   No friction rub. No gallop.  Pulmonary:     Effort: Pulmonary effort is normal. No respiratory distress.     Breath sounds: Normal breath sounds.  Chest:     Chest wall: No tenderness.  Abdominal:     General: Bowel sounds are normal.     Palpations: Abdomen is soft.     Tenderness: There  is no abdominal tenderness. There is no guarding or rebound. Negative signs include Murphy's sign and McBurney's sign.     Hernia: No hernia is present.  Musculoskeletal:        General: Normal range of motion.     Cervical back: Full passive range of motion without pain, normal range of motion and neck supple. No muscular tenderness.  Skin:    General: Skin is  warm and dry.     Findings: No rash.  Neurological:     Mental Status: She is alert and oriented to person, place, and time.     GCS: GCS eye subscore is 4. GCS verbal subscore is 5. GCS motor subscore is 6.     Cranial Nerves: No cranial nerve deficit.     Sensory: No sensory deficit.     Coordination: Coordination normal.  Psychiatric:        Speech: Speech normal.        Behavior: Behavior normal.        Thought Content: Thought content normal.    ED Results / Procedures / Treatments   Labs (all labs ordered are listed, but only abnormal results are displayed) Labs Reviewed - No data to display  EKG None  Radiology No results found.  Procedures Procedures   Medications Ordered in ED Medications  oseltamivir (TAMIFLU) capsule 75 mg (has no administration in time range)  ketorolac (TORADOL) injection 60 mg (has no administration in time range)    ED Course  I have reviewed the triage vital signs and the nursing notes.  Pertinent labs & imaging results that were available during my care of the patient were reviewed by me and considered in my medical decision making (see chart for details).    MDM Rules/Calculators/A&P                           Patient presents with 2 days of flulike illness.  Patient with fever, chills, headache, cough, congestion.  Patient appears well.  No meningismus.  No rash.  Lungs are clear to auscultation bilaterally.  Oxygenation is normal.  She is afebrile currently with normal vital signs.  All symptoms are explained by likely influenza.  We will treat empirically.  Final Clinical Impression(s) / ED Diagnoses Final diagnoses:  Flu    Rx / DC Orders ED Discharge Orders          Ordered    oseltamivir (TAMIFLU) 75 MG capsule  Every 12 hours        04/29/21 0326    benzonatate (TESSALON) 100 MG capsule  Every 8 hours        04/29/21 0326             Orpah Greek, MD 04/29/21 [phone]

## 2021-05-10 ENCOUNTER — Emergency Department (HOSPITAL_COMMUNITY)
Admission: EM | Admit: 2021-05-10 | Discharge: 2021-05-10 | Disposition: A | Discharge status: Home / Self Care | Payer: BC Managed Care – PPO | Attending: Emergency Medicine | Admitting: Emergency Medicine

## 2021-05-10 ENCOUNTER — Encounter (HOSPITAL_COMMUNITY): Payer: Self-pay | Admitting: Emergency Medicine

## 2021-05-10 ENCOUNTER — Other Ambulatory Visit: Payer: Self-pay

## 2021-05-10 ENCOUNTER — Emergency Department (HOSPITAL_COMMUNITY): Payer: BC Managed Care – PPO

## 2021-05-10 DIAGNOSIS — R519 Headache, unspecified: Secondary | ICD-10-CM | POA: Insufficient documentation

## 2021-05-10 DIAGNOSIS — G8929 Other chronic pain: Secondary | ICD-10-CM | POA: Diagnosis not present

## 2021-05-10 DIAGNOSIS — M542 Cervicalgia: Secondary | ICD-10-CM | POA: Diagnosis not present

## 2021-05-10 DIAGNOSIS — J45909 Unspecified asthma, uncomplicated: Secondary | ICD-10-CM | POA: Insufficient documentation

## 2021-05-10 LAB — BASIC METABOLIC PANEL
Anion gap: 5 (ref 5–15)
BUN: 10 mg/dL (ref 6–20)
CO2: 27 mmol/L (ref 22–32)
Calcium: 8.7 mg/dL — ABNORMAL LOW (ref 8.9–10.3)
Chloride: 103 mmol/L (ref 98–111)
Creatinine, Ser: 0.68 mg/dL (ref 0.44–1.00)
GFR, Estimated: >60 mL/min (ref >60)
Glucose, Bld: 88 mg/dL (ref 70–99)
Potassium: 4 mmol/L (ref 3.5–5.1)
Sodium: 135 mmol/L (ref 135–145)

## 2021-05-10 LAB — CBC
HCT: 40.2 % (ref 36.0–46.0)
Hemoglobin: 12.9 g/dL (ref 12.0–15.0)
MCH: 30.7 pg (ref 26.0–34.0)
MCHC: 32.1 g/dL (ref 30.0–36.0)
MCV: 95.7 fL (ref 80.0–100.0)
Platelets: 401 10*3/uL — ABNORMAL HIGH (ref 150–400)
RBC: 4.2 MIL/uL (ref 3.87–5.11)
RDW: 12.5 % (ref 11.5–15.5)
WBC: 4.9 10*3/uL (ref 4.0–10.5)
nRBC: 0 % (ref 0.0–0.2)

## 2021-05-10 MED ORDER — KETOROLAC TROMETHAMINE 30 MG/ML IJ SOLN
15.0000 mg | Freq: Once | INTRAMUSCULAR | Status: AC
  Administered 2021-05-10: 15 mg via INTRAMUSCULAR
  Filled 2021-05-10: qty 1

## 2021-05-10 NOTE — ED Provider Notes (Signed)
[facility] Provider Note   CSN: [id number] Arrival date & time: 05/10/21  1831     History Chief Complaint  Patient presents with   Headache    Yvette Mayer is a 50 y.o. female.   Headache Associated symptoms: neck pain   Associated symptoms: no abdominal pain and no congestion   Patient with headache.  Has had recently for last couple weeks.  2 weeks ago treated for flu.  Did not have flu testing that time.  However patient does state the headache is been going that.  States she does not tend to get headaches, however reviewing records appears if she has chronic headaches and chronic neck pain.  States the pain is on the left side of her head.  States there is a couple bumps on the head.  States she also feels weird on the left side of the body at times.  No fevers.  No chills.  States she is feeling better from the flulike symptoms.  States her eyes get a little blurry with the headaches and she will get some lines on it.    Past Medical History:  Diagnosis Date   Abdominal pain 06/12/2014   Allergy    Anemia    iron 1 yr ago, normal hgb 7/12   Anxiety    Asthma    pt says no-bronchitis   Bloated abdomen 10/15/2015   Chronic back pain    Chronic headaches    Chronic neck pain    Chronic sinusitis    Current use of estrogen therapy 04/16/2015   Dyspareunia 09/12/2014   Elevated TSH 04/16/2015   Fatigue 04/10/2015   Fibromyalgia    Gas 10/15/2015   GERD (gastroesophageal reflux disease)    Herpes simplex virus (HSV) infection    History of kidney stones    Hot flashes 04/10/2015   Hyperlipidemia    Kidney stone on right side 10/24/2015   Left-sided weakness    Mental disorder    Ovarian cyst, right    Paresthesias    Shortness of breath    Sinus drainage    Sjoegren syndrome    Symptoms, such as flushing, sleeplessness, headache, lack of concentration, associated with the menopause 04/10/2015   TMJ (dislocation of temporomandibular joint)     Vaginal discharge 08/08/2015   Vertigo    Yeast infection 10/15/2015    Patient Active Problem List   Diagnosis Date Noted   Screening examination for STD (sexually transmitted disease) 03/11/2021   Encounter for screening fecal occult blood testing 05/09/2020   Encounter for well woman exam with routine gynecological exam 05/09/2020   Irritable bowel syndrome with both constipation and diarrhea 05/09/2020   Menopause 04/06/2019   Sleep disturbance 04/06/2019   Current use of estrogen therapy 04/06/2019   Carpal tunnel syndrome 03/06/2019   Sensation of fullness in both ears 03/01/2019   Abdominal pain 03/02/2018   History of UTI 11/29/2017   Dyspareunia, female 11/29/2017   Seasonal and perennial allergic rhinitis 10/12/2017   Adverse food reaction 10/12/2017   ANA positive Speckled 1:320 titer ENA negative  11/20/2016   Leukopenia 11/11/2016   History of bruising easily 09/08/2016   Borderline high cholesterol 09/08/2016   Situational mixed anxiety and depressive disorder 08/24/2016   Rectocele 07/16/2016   Kidney stone on right side 10/24/2015   Vaginal discharge 08/08/2015   Hot flashes 04/10/2015   Symptoms, such as flushing, sleeplessness, headache, lack of concentration, associated with the menopause 04/10/2015  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/2015   Constipation 12/26/2013   Overweight (BMI 25.0-29.9) 12/26/2013   Sjogren's syndrome ([place]) 08/09/2013   Chronic insomnia 09/13/2012   GERD (gastroesophageal reflux disease) 09/18/2011   Anxiety 09/03/2011   Hyperlipidemia 09/02/2011   Fibromyalgia 09/02/2011   Chronic nasal congestion 09/02/2011   Vitamin D deficiency 09/02/2011   Dysphagia 01/13/2011    Past Surgical History:  Procedure Laterality Date   ABDOMINAL HYSTERECTOMY     BIOPSY  09/09/2016   Procedure: BIOPSY;  Surgeon: Daneil Dolin, MD;  [facility]: [facility];  Service: Endoscopy;;  esophageal   BREAST CYST EXCISION Right 1980   benign   CESAREAN SECTION      x2   CHOLECYSTECTOMY  2005   cholelithiasis   COLONOSCOPY N/A 09/09/2016   Procedure: COLONOSCOPY;  Surgeon: Daneil Dolin, MD;  Location: [facility];  Service: Endoscopy;  Laterality: N/A;  8:30am   ESOPHAGOGASTRODUODENOSCOPY  01/23/2011   HAL:[id number] undulating Z-line vs short segment Barrett s/p bx (NOT BARRETT's)/small HH otherwise normal   ESOPHAGOGASTRODUODENOSCOPY N/A 09/09/2016   Procedure: ESOPHAGOGASTRODUODENOSCOPY (EGD);  Surgeon: Daneil Dolin, MD;  [facility]: [facility];  Service: Endoscopy;  Laterality: N/A;   ESOPHAGOGASTRODUODENOSCOPY (EGD) WITH PROPOFOL N/A 08/08/2020   Procedure: ESOPHAGOGASTRODUODENOSCOPY (EGD) WITH PROPOFOL;  Surgeon: Daneil Dolin, MD;  [facility]: [facility];  Service: Endoscopy;  Laterality: N/A;  PM (ASA 3), pt tested + 2/7 <90 days   MALONEY DILATION  01/23/2011   Procedure: MALONEY DILATION;  Surgeon: Daneil Dolin, MD;  [facility]: [facility];  Service: Endoscopy;  Laterality: N/A;   MALONEY DILATION N/A 09/09/2016   Procedure: Venia Minks DILATION;  Surgeon: Daneil Dolin, MD;  [facility]: [facility];  Service: Endoscopy;  Laterality: N/A;   MALONEY DILATION N/A 08/08/2020   Procedure: Venia Minks DILATION;  Surgeon: Daneil Dolin, MD;  Location: [facility];  Service: Endoscopy;  Laterality: N/A;   OVARIAN CYST REMOVAL  92 removal of cysts from behind ovaries   PARTIAL HYSTERECTOMY  93   SEPTOPLASTY     2011   [place]     2010   TURBINATE RESECTION Bilateral 05/29/2013   Procedure: TURBINATE RESECTION;  Surgeon: Ascencion Dike, MD;  Location: [facility];  Service: ENT;  Laterality: Bilateral;   UPPER GASTROINTESTINAL ENDOSCOPY       OB History     Gravida  4   Para  3   Term  2   Preterm  1   AB  1   Living  3      SAB  1   IAB      Ectopic      Multiple      Live Births  3           Family History  Problem Relation Age of Onset   Hypertension Mother     Thyroid disease Sister        overactive   Asthma Sister    Eczema Sister    Bronchitis Son    Heart disease Maternal Grandmother    Hypertension Maternal Grandmother    Alcohol abuse Maternal Grandfather    Scoliosis Son    Other Maternal Aunt        Sjeogren syndrome   Other Maternal Aunt        Sjoegren syndrome   Other Maternal Aunt        Sjoegren syndrome   Anesthesia  problems Neg Hx    Malignant hyperthermia Neg Hx    Hypotension Neg Hx    Pseudochol deficiency Neg Hx    Colon cancer Neg Hx     Social History   Tobacco Use   Smoking status: Never   Smokeless tobacco: Never  Vaping Use   Vaping Use: Never used  Substance Use Topics   Alcohol use: No   Drug use: No    Home Medications Prior to Admission medications   Medication Sig Start Date End Date Taking? Authorizing Provider  ARMOUR THYROID 30 MG tablet Take 30 mg by mouth daily. 12/25/20   [provider]  benzonatate (TESSALON) 100 MG capsule Take 1 capsule (100 mg total) by mouth every 8 (eight) hours. 04/29/21   Orpah Greek, MD  cefdinir (OMNICEF) 300 MG capsule Take 300 mg by mouth every 12 (twelve) hours. 03/05/21   [provider]  cetirizine (ZYRTEC ALLERGY) 10 MG tablet Take 1 tablet (10 mg total) by mouth daily. 09/03/20   Eulogio Bear, NP  [employer] DIGESTIVE PROBIOTIC 250 MG capsule Take 250 mg by mouth 2 (two) times daily. 03/05/21   [provider]  cyclobenzaprine (FLEXERIL) 5 MG tablet Take 1 tablet (5 mg total) by mouth at bedtime as needed for muscle spasms. 06/05/20   Alycia Rossetti, MD  fluticasone [facility]) 50 MCG/ACT nasal spray Place 1 spray into both nostrils daily for 14 days. 07/08/20 03/11/21  Avegno, Darrelyn Hillock, FNP  guaiFENesin (ROBITUSSIN) 100 MG/5ML liquid Take 200 mg by mouth 3 (three) times daily as needed for cough.    [provider]  LORazepam (ATIVAN) 0.5 MG tablet Take 1 tablet (0.5 mg total) by mouth 2 (two) times daily as needed  for anxiety. 10/04/20   Eulogio Bear, NP  metroNIDAZOLE (FLAGYL) 500 MG tablet Take 1 tablet (500 mg total) by mouth 2 (two) times daily. 03/13/21   Estill Dooms, NP  oseltamivir (TAMIFLU) 75 MG capsule Take 1 capsule (75 mg total) by mouth every 12 (twelve) hours. 04/29/21   Orpah Greek, MD  pantoprazole (PROTONIX) 40 MG tablet Take 1 tablet (40 mg total) by mouth daily. 02/28/21   Mahala Menghini, PA-C  sucralfate (CARAFATE) 1 g tablet Take 1 tablet (1 g total) by mouth 4 (four) times daily -  with meals and at bedtime. 07/30/20 08/29/20  Rourk, Cristopher Estimable, MD    Allergies    Hydrocodone-acetaminophen, Percocet [oxycodone-acetaminophen], and Plaquenil [hydroxychloroquine sulfate]  Review of Systems   Review of Systems  Constitutional:  Positive for appetite change.  HENT:  Negative for congestion.   Eyes:  Positive for visual disturbance.  Respiratory:  Negative for shortness of breath.   Cardiovascular:  Negative for chest pain.  Gastrointestinal:  Negative for abdominal pain.  Musculoskeletal:  Positive for neck pain.  Skin:  Negative for rash.  Neurological:  Positive for headaches.  Hematological:  Negative for adenopathy.  Psychiatric/Behavioral:  Negative for confusion.    Physical Exam Updated Vital Signs BP 119/77   Pulse 71   Temp 98.1 F (36.7 C) (Oral)   Resp 16   Ht 5\' 5"  (1.651 m)   Wt 77.1 kg   SpO2 97%   BMI 28.29 kg/m   Physical Exam Vitals and nursing note reviewed.  Constitutional:      Appearance: She is well-developed.  HENT:     Head: Atraumatic.     Comments: No rash side of head.  Sebaceous cyst on  left temple area.  Also likely lipoma occipital area. Eyes:     Extraocular Movements: Extraocular movements intact.     Pupils: Pupils are equal, round, and reactive to light.  Pulmonary:     Effort: Pulmonary effort is normal.  Abdominal:     Tenderness: There is no abdominal tenderness.  Musculoskeletal:     Cervical back:  Normal range of motion and neck supple.  Skin:    General: Skin is warm.  Neurological:     Mental Status: She is alert.  Psychiatric:        Mood and Affect: Mood normal.    ED Results / Procedures / Treatments   Labs (all labs ordered are listed, but only abnormal results are displayed) Labs Reviewed  CBC - Abnormal; Notable for the following components:      Result Value   Platelets 401 (*)    All other components within normal limits  BASIC METABOLIC PANEL - Abnormal; Notable for the following components:   Calcium 8.7 (*)    All other components within normal limits    EKG None  Radiology No results found.  Procedures Procedures   Medications Ordered in ED Medications  ketorolac (TORADOL) 30 MG/ML injection 15 mg (15 mg Intramuscular Given 05/10/21 2029)    ED Course  I have reviewed the triage vital signs and the nursing notes.  Pertinent labs & imaging results that were available during my care of the patient were reviewed by me and considered in my medical decision making (see chart for details).    MDM Rules/Calculators/A&P                           Patient headache.  Patient states that is just been over the last month reviewing records appears somewhat chronic over years.  Left side of head.  Down the neck.  Reviewing previous imaging there is no severe pathology.  Feels better after treatment.  Lab work done reassuring.  Did have recent URI infection was treated for flu.  Will have follow-up with neurology for further evaluation of the headache Final Clinical Impression(s) / ED Diagnoses Final diagnoses:  Generalized headache    Rx / DC Orders ED Discharge Orders     None        Davonna Belling, MD 05/10/21 2359

## 2021-05-10 NOTE — ED Triage Notes (Addendum)
Pt with c/o severe L sided headache x 2 weeks. States her entire L side of body hurts. Pt seen here last week for URI. Pt states she has pressure behind her L eye and that she has "some spots" on her L side of scalp.

## 2021-06-08 ENCOUNTER — Other Ambulatory Visit: Payer: Self-pay | Admitting: Nurse Practitioner

## 2021-06-19 ENCOUNTER — Other Ambulatory Visit: Payer: BC Managed Care – PPO | Admitting: Adult Health

## 2021-07-10 ENCOUNTER — Ambulatory Visit: Payer: BC Managed Care – PPO | Admitting: Nurse Practitioner

## 2021-07-11 DIAGNOSIS — M546 Pain in thoracic spine: Secondary | ICD-10-CM | POA: Diagnosis not present

## 2021-07-11 DIAGNOSIS — M9902 Segmental and somatic dysfunction of thoracic region: Secondary | ICD-10-CM | POA: Diagnosis not present

## 2021-07-11 DIAGNOSIS — M9901 Segmental and somatic dysfunction of cervical region: Secondary | ICD-10-CM | POA: Diagnosis not present

## 2021-07-11 DIAGNOSIS — M542 Cervicalgia: Secondary | ICD-10-CM | POA: Diagnosis not present

## 2021-07-12 ENCOUNTER — Emergency Department (HOSPITAL_COMMUNITY)
Admission: EM | Admit: 2021-07-12 | Discharge: 2021-07-12 | Disposition: A | Discharge status: Home / Self Care | Payer: BC Managed Care – PPO | Attending: Emergency Medicine | Admitting: Emergency Medicine

## 2021-07-12 ENCOUNTER — Emergency Department (HOSPITAL_COMMUNITY): Payer: BC Managed Care – PPO

## 2021-07-12 ENCOUNTER — Encounter (HOSPITAL_COMMUNITY): Payer: Self-pay | Admitting: *Deleted

## 2021-07-12 ENCOUNTER — Other Ambulatory Visit: Payer: Self-pay

## 2021-07-12 DIAGNOSIS — R1084 Generalized abdominal pain: Secondary | ICD-10-CM

## 2021-07-12 DIAGNOSIS — M797 Fibromyalgia: Secondary | ICD-10-CM | POA: Diagnosis not present

## 2021-07-12 DIAGNOSIS — R109 Unspecified abdominal pain: Secondary | ICD-10-CM | POA: Diagnosis not present

## 2021-07-12 DIAGNOSIS — Z79899 Other long term (current) drug therapy: Secondary | ICD-10-CM | POA: Diagnosis not present

## 2021-07-12 LAB — CBC WITH DIFFERENTIAL/PLATELET
Abs Immature Granulocytes: 0 10*3/uL (ref 0.00–0.07)
Basophils Absolute: 0.1 10*3/uL (ref 0.0–0.1)
Basophils Relative: 2 %
Eosinophils Absolute: 0.1 10*3/uL (ref 0.0–0.5)
Eosinophils Relative: 2 %
HCT: 40.1 % (ref 36.0–46.0)
Hemoglobin: 13.6 g/dL (ref 12.0–15.0)
Immature Granulocytes: 0 %
Lymphocytes Relative: 46 %
Lymphs Abs: 1.7 10*3/uL (ref 0.7–4.0)
MCH: 31.7 pg (ref 26.0–34.0)
MCHC: 33.9 g/dL (ref 30.0–36.0)
MCV: 93.5 fL (ref 80.0–100.0)
Monocytes Absolute: 0.4 10*3/uL (ref 0.1–1.0)
Monocytes Relative: 11 %
Neutro Abs: 1.4 10*3/uL — ABNORMAL LOW (ref 1.7–7.7)
Neutrophils Relative %: 39 %
Platelets: 371 10*3/uL (ref 150–400)
RBC: 4.29 MIL/uL (ref 3.87–5.11)
RDW: 12.8 % (ref 11.5–15.5)
WBC: 3.6 10*3/uL — ABNORMAL LOW (ref 4.0–10.5)
nRBC: 0 % (ref 0.0–0.2)

## 2021-07-12 LAB — COMPREHENSIVE METABOLIC PANEL
ALT: 19 U/L (ref 0–44)
AST: 23 U/L (ref 15–41)
Albumin: 3.8 g/dL (ref 3.5–5.0)
Alkaline Phosphatase: 62 U/L (ref 38–126)
Anion gap: 6 (ref 5–15)
BUN: 11 mg/dL (ref 6–20)
CO2: 28 mmol/L (ref 22–32)
Calcium: 9 mg/dL (ref 8.9–10.3)
Chloride: 105 mmol/L (ref 98–111)
Creatinine, Ser: 0.7 mg/dL (ref 0.44–1.00)
GFR, Estimated: >60 mL/min (ref >60)
Glucose, Bld: 91 mg/dL (ref 70–99)
Potassium: 4.2 mmol/L (ref 3.5–5.1)
Sodium: 139 mmol/L (ref 135–145)
Total Bilirubin: 0.6 mg/dL (ref 0.3–1.2)
Total Protein: 7.5 g/dL (ref 6.5–8.1)

## 2021-07-12 LAB — LIPASE, BLOOD: Lipase: 37 U/L (ref 11–51)

## 2021-07-12 MED ORDER — DIPHENHYDRAMINE HCL 50 MG/ML IJ SOLN
25.0000 mg | Freq: Once | INTRAMUSCULAR | Status: AC
  Administered 2021-07-12: 25 mg via INTRAVENOUS
  Filled 2021-07-12: qty 1

## 2021-07-12 MED ORDER — IOHEXOL 300 MG/ML  SOLN
100.0000 mL | Freq: Once | INTRAMUSCULAR | Status: AC | PRN
  Administered 2021-07-12: 100 mL via INTRAVENOUS

## 2021-07-12 MED ORDER — HYDROMORPHONE HCL 1 MG/ML IJ SOLN
1.0000 mg | Freq: Once | INTRAMUSCULAR | Status: AC
  Administered 2021-07-12: 1 mg via INTRAVENOUS
  Filled 2021-07-12: qty 1

## 2021-07-12 MED ORDER — ONDANSETRON HCL 4 MG/2ML IJ SOLN
4.0000 mg | Freq: Once | INTRAMUSCULAR | Status: AC
  Administered 2021-07-12: 4 mg via INTRAVENOUS
  Filled 2021-07-12: qty 2

## 2021-07-12 NOTE — ED Triage Notes (Signed)
Pt c/o pain all "inside" her body, but worse on the left side x couple of weeks. Pt reports she saw her chiropractor and he told her she was very "tight". Pt has multiple pain sites-left ear pressure, eye pain, headache, shoulder pain, left arm pain, abdominal pain, left leg pain.

## 2021-07-12 NOTE — ED Provider Notes (Signed)
[facility] Provider Note   CSN: [id number] Arrival date & time: 07/12/21  0920     History  Chief Complaint  Patient presents with   Generalized Pain    Yvette Mayer is a 51 y.o. female.  Patient with longstanding history of fibromyalgia.  Seen by rheumatology in June 2021.  Also has sicca complex as well.  Patient is being followed here more recently by a chiropractor.  Helping her with various pains.  Patient states that she has been hurting all over cyst for the past 2 weeks.  But has some concern that she thinks that her bowels may be somewhat obstructed.  She may be constipated.  She is having some abdominal pain and distention.  Past medical history is very extensive.  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  In addition patient's had an abdominal hysterectomy.      Home Medications Prior to Admission medications   Medication Sig Start Date End Date Taking? Authorizing Provider  ARMOUR THYROID 30 MG tablet Take 30 mg by mouth daily. 12/25/20   [provider]  benzonatate (TESSALON) 100 MG capsule Take 1 capsule (100 mg total) by mouth every 8 (eight) hours. 04/29/21   Orpah Greek, MD  cefdinir (OMNICEF) 300 MG capsule Take 300 mg by mouth every 12 (twelve) hours. 03/05/21   [provider]  cetirizine (ZYRTEC ALLERGY) 10 MG tablet Take 1 tablet (10 mg total) by mouth daily. 09/03/20   Eulogio Bear, NP  [employer] DIGESTIVE PROBIOTIC 250 MG capsule Take 250 mg by mouth 2 (two) times daily. 03/05/21   [provider]  cyclobenzaprine (FLEXERIL) 5 MG tablet Take 1 tablet (5 mg total) by mouth at bedtime as needed for muscle spasms. 06/05/20   Alycia Rossetti, MD  fluticasone [facility]) 50 MCG/ACT nasal spray Place 1 spray into both nostrils daily for 14 days. 07/08/20 03/11/21  Avegno, Darrelyn Hillock, FNP  guaiFENesin (ROBITUSSIN) 100 MG/5ML liquid Take 200 mg by  mouth 3 (three) times daily as needed for cough.    [provider]  LORazepam (ATIVAN) 0.5 MG tablet Take 1 tablet (0.5 mg total) by mouth 2 (two) times daily as needed for anxiety. 10/04/20   Eulogio Bear, NP  metroNIDAZOLE (FLAGYL) 500 MG tablet Take 1 tablet (500 mg total) by mouth 2 (two) times daily. 03/13/21   Estill Dooms, NP  oseltamivir (TAMIFLU) 75 MG capsule Take 1 capsule (75 mg total) by mouth every 12 (twelve) hours. 04/29/21   Orpah Greek, MD  pantoprazole (PROTONIX) 40 MG tablet Take 1 tablet (40 mg total) by mouth daily. 02/28/21   Mahala Menghini, PA-C  sucralfate (CARAFATE) 1 g tablet Take 1 tablet (1 g total) by mouth 4 (four) times daily -  with meals and at bedtime. 07/30/20 08/29/20  Rourk, Cristopher Estimable, MD      Allergies    Hydrocodone-acetaminophen, Percocet [oxycodone-acetaminophen], and Plaquenil [hydroxychloroquine sulfate]    Review of Systems   Review of Systems  Constitutional:  Negative for chills and fever.  HENT:  Negative for congestion, ear pain and sore throat.   Eyes:  Negative for pain and visual disturbance.  Respiratory:  Negative for cough and shortness of breath.   Cardiovascular:  Negative for chest pain and palpitations.  Gastrointestinal:  Positive for abdominal distention, abdominal pain and constipation. Negative for diarrhea, nausea and vomiting.  Genitourinary:  Negative for dysuria and hematuria.  Musculoskeletal:  Positive for arthralgias and myalgias. Negative for back pain.  Skin:  Negative for color change and rash.  Neurological:  Positive for headaches. Negative for seizures and syncope.  All other systems reviewed and are negative.  Physical Exam Updated Vital Signs BP 102/63    Pulse (!) 55    Temp 98.2 F (36.8 C) (Oral)    Resp 16    Ht 1.651 m (5\' 5" )    Wt 77.1 kg    SpO2 96%    BMI 28.29 kg/m  Physical Exam Vitals and nursing note reviewed.  Constitutional:      General: She is not in acute  distress.    Appearance: Normal appearance. She is well-developed.  HENT:     Head: Normocephalic and atraumatic.  Eyes:     Extraocular Movements: Extraocular movements intact.     Conjunctiva/sclera: Conjunctivae normal.     Pupils: Pupils are equal, round, and reactive to light.  Cardiovascular:     Rate and Rhythm: Normal rate and regular rhythm.     Heart sounds: No murmur heard. Pulmonary:     Effort: Pulmonary effort is normal. No respiratory distress.     Breath sounds: Normal breath sounds.  Abdominal:     General: There is distension.     Palpations: Abdomen is soft.     Tenderness: There is no abdominal tenderness. There is no guarding.  Musculoskeletal:        General: No swelling.     Cervical back: Normal range of motion and neck supple.  Skin:    General: Skin is warm and dry.     Capillary Refill: Capillary refill takes less than 2 seconds.  Neurological:     General: No focal deficit present.     Mental Status: She is alert and oriented to person, place, and time.     Cranial Nerves: No cranial nerve deficit.     Sensory: No sensory deficit.     Motor: No weakness.  Psychiatric:        Mood and Affect: Mood normal.    ED Results / Procedures / Treatments   Labs (all labs ordered are listed, but only abnormal results are displayed) Labs Reviewed  CBC WITH DIFFERENTIAL/PLATELET - Abnormal; Notable for the following components:      Result Value   WBC 3.6 (*)    Neutro Abs 1.4 (*)    All other components within normal limits  COMPREHENSIVE METABOLIC PANEL  LIPASE, BLOOD    EKG None  Radiology CT Abdomen Pelvis W Contrast  Result Date: 07/12/2021 CLINICAL DATA:  Pt c/o intermittent left sided abdominal pain with intermittent nausea, diarrhea and constipation for several months EXAM: CT ABDOMEN AND PELVIS WITH CONTRAST TECHNIQUE: Multidetector CT imaging of the abdomen and pelvis was performed using the standard protocol following bolus administration  of intravenous contrast. RADIATION DOSE REDUCTION: This exam was performed according to the departmental dose-optimization program which includes automated exposure control, adjustment of the mA and/or kV according to patient size and/or use of iterative reconstruction technique. CONTRAST:  160mL OMNIPAQUE IOHEXOL 300 MG/ML  SOLN COMPARISON:  CT abdomen pelvis 10/16/2019 FINDINGS: Lower chest: No acute abnormality. Hepatobiliary: No focal liver abnormality is seen. Status post cholecystectomy. No biliary dilatation. Pancreas: Unremarkable. No pancreatic ductal dilatation or surrounding inflammatory changes. Spleen: Normal in size without focal abnormality. Adrenals/Urinary Tract: Adrenal glands are unremarkable. There is a tiny hypodensity in the right kidney which is too small to fully characterize but  likely represents a small cyst, similar to prior. No renal calculi or hydronephrosis. No suspicious mass lesion. Urinary bladder is unremarkable. Stomach/Bowel: Stomach is within normal limits. Appendix appears normal. No evidence of bowel wall thickening, distention, or inflammatory changes. Scattered colonic diverticula without evidence of diverticulitis. Vascular/Lymphatic: No significant vascular findings are present. No enlarged abdominal or pelvic lymph nodes. Reproductive: Status post hysterectomy. No adnexal masses. Other: Trace free fluid in the pelvis, likely physiologic. Musculoskeletal: No acute or significant osseous findings. IMPRESSION: 1. No acute intra-abdominal pathology. 2. Scattered colonic diverticula without evidence of diverticulitis. Electronically Signed   By: Audie Pinto M.D.   On: 07/12/2021 13:50    Procedures Procedures    Medications Ordered in ED Medications  ondansetron (ZOFRAN) injection 4 mg (4 mg Intravenous Given 07/12/21 1141)  HYDROmorphone (DILAUDID) injection 1 mg (1 mg Intravenous Given 07/12/21 1142)  diphenhydrAMINE (BENADRYL) injection 25 mg (25 mg Intravenous  Given 07/12/21 1142)  iohexol (OMNIPAQUE) 300 MG/ML solution 100 mL (100 mLs Intravenous Contrast Given 07/12/21 1317)    ED Course/ Medical Decision Making/ A&P                           Medical Decision Making Amount and/or Complexity of Data Reviewed Labs: ordered. Radiology: ordered.  Risk Prescription drug management.   Feel that patient's complaint is mostly her fibromyalgia and connective tissue disorder.  But the abdominal complaint is appropriate for additional evaluation.  Patient nontoxic no acute distress.  Abdomen soft and nontender.  We will go ahead and get labs.  Give some Zofran and some pain medication and Benadryl because she sometimes get itching with pain medicine.  And then go ahead and get CT scan of the abdomen and pelvis.  Patient's had a total abdominal hysterectomy.  Sometimes may just be secondary to constipation.  CT abdomen without any acute findings.  Complete metabolic panel including liver function test without any abnormalities.  CBC White blood cell count 3.6.  Hemoglobin 13.6.  Specifically CT scan of the abdomen shows no evidence of any constipation.  Patient can be treated symptomatically.   Final Clinical Impression(s) / ED Diagnoses Final diagnoses:  Fibromyalgia  Generalized abdominal pain    Rx / DC Orders ED Discharge Orders     None         Fredia Sorrow, MD 07/12/21 1357

## 2021-07-12 NOTE — Discharge Instructions (Signed)
CT scan of the abdomen without any acute findings.  Labs without significant abnormalities.  Continue your current medications.  Specifically CT of abdomen shows no significant constipation.

## 2021-07-21 DIAGNOSIS — M9901 Segmental and somatic dysfunction of cervical region: Secondary | ICD-10-CM | POA: Diagnosis not present

## 2021-07-21 DIAGNOSIS — M542 Cervicalgia: Secondary | ICD-10-CM | POA: Diagnosis not present

## 2021-07-21 DIAGNOSIS — M546 Pain in thoracic spine: Secondary | ICD-10-CM | POA: Diagnosis not present

## 2021-07-21 DIAGNOSIS — M9902 Segmental and somatic dysfunction of thoracic region: Secondary | ICD-10-CM | POA: Diagnosis not present

## 2021-07-23 DIAGNOSIS — M9901 Segmental and somatic dysfunction of cervical region: Secondary | ICD-10-CM | POA: Diagnosis not present

## 2021-07-23 DIAGNOSIS — M542 Cervicalgia: Secondary | ICD-10-CM | POA: Diagnosis not present

## 2021-07-23 DIAGNOSIS — M546 Pain in thoracic spine: Secondary | ICD-10-CM | POA: Diagnosis not present

## 2021-07-23 DIAGNOSIS — M9902 Segmental and somatic dysfunction of thoracic region: Secondary | ICD-10-CM | POA: Diagnosis not present

## 2021-07-25 ENCOUNTER — Other Ambulatory Visit (HOSPITAL_COMMUNITY)
Admission: RE | Admit: 2021-07-25 | Discharge: 2021-07-25 | Disposition: A | Discharge status: Home / Self Care | Payer: BC Managed Care – PPO | Source: Ambulatory Visit | Attending: Adult Health | Admitting: Adult Health

## 2021-07-25 ENCOUNTER — Ambulatory Visit (INDEPENDENT_AMBULATORY_CARE_PROVIDER_SITE_OTHER): Payer: BC Managed Care – PPO | Admitting: Adult Health

## 2021-07-25 ENCOUNTER — Encounter: Payer: Self-pay | Admitting: Adult Health

## 2021-07-25 ENCOUNTER — Other Ambulatory Visit: Payer: Self-pay

## 2021-07-25 VITALS — BP 113/70 | HR 67 | Ht 63.5 in | Wt 175.5 lb

## 2021-07-25 DIAGNOSIS — G479 Sleep disorder, unspecified: Secondary | ICD-10-CM | POA: Insufficient documentation

## 2021-07-25 DIAGNOSIS — F419 Anxiety disorder, unspecified: Secondary | ICD-10-CM | POA: Diagnosis not present

## 2021-07-25 DIAGNOSIS — N898 Other specified noninflammatory disorders of vagina: Secondary | ICD-10-CM | POA: Insufficient documentation

## 2021-07-25 DIAGNOSIS — Z90710 Acquired absence of both cervix and uterus: Secondary | ICD-10-CM | POA: Insufficient documentation

## 2021-07-25 DIAGNOSIS — Z1211 Encounter for screening for malignant neoplasm of colon: Secondary | ICD-10-CM | POA: Diagnosis not present

## 2021-07-25 DIAGNOSIS — Z01419 Encounter for gynecological examination (general) (routine) without abnormal findings: Secondary | ICD-10-CM | POA: Diagnosis not present

## 2021-07-25 DIAGNOSIS — Z79890 Hormone replacement therapy (postmenopausal): Secondary | ICD-10-CM | POA: Diagnosis not present

## 2021-07-25 DIAGNOSIS — M9901 Segmental and somatic dysfunction of cervical region: Secondary | ICD-10-CM | POA: Diagnosis not present

## 2021-07-25 DIAGNOSIS — M542 Cervicalgia: Secondary | ICD-10-CM | POA: Diagnosis not present

## 2021-07-25 DIAGNOSIS — Z79818 Long term (current) use of other agents affecting estrogen receptors and estrogen levels: Secondary | ICD-10-CM

## 2021-07-25 DIAGNOSIS — N760 Acute vaginitis: Secondary | ICD-10-CM | POA: Insufficient documentation

## 2021-07-25 DIAGNOSIS — M546 Pain in thoracic spine: Secondary | ICD-10-CM | POA: Diagnosis not present

## 2021-07-25 DIAGNOSIS — Z79899 Other long term (current) drug therapy: Secondary | ICD-10-CM

## 2021-07-25 DIAGNOSIS — M9902 Segmental and somatic dysfunction of thoracic region: Secondary | ICD-10-CM | POA: Diagnosis not present

## 2021-07-25 LAB — HEMOCCULT GUIAC POC 1CARD (OFFICE): Fecal Occult Blood, POC: NEGATIVE

## 2021-07-25 MED ORDER — ESTRADIOL 2 MG PO TABS: 2.0000 mg | ORAL_TABLET | Freq: Every day | ORAL | 3 refills | Status: DC

## 2021-07-25 MED ORDER — TRAZODONE HCL 50 MG PO TABS: 50.0000 mg | ORAL_TABLET | Freq: Every day | ORAL | 3 refills | Status: DC

## 2021-07-25 NOTE — Progress Notes (Signed)
[name], female   DOB: 1971-02-27, 51 y.o.   MRN: [id number] History of Present Illness: Yvette Mayer is a 51 year old black female,divorced sp hysterectomy in for well woman gyn exam.  She was seen at [facility] and had labs, and most recently she went to ER 07/12/21 for generalized pain. PCP is F. Paseda NP, who she sees in April.    Current Medications, Allergies, Past Medical History, Past Surgical History, Family History and Social History were reviewed in [employer] record.     Review of Systems: Patient denies any headaches, hearing loss, fatigue, blurred vision, shortness of breath, chest pain, abdominal pain, problems with bowel movements,takes fiber, urination, or intercourse. (Not having sex).No joint pain or mood swings.  Has body aches Has TMJ and sees chiropractor Not sleeping Feels anxious, mind races   Physical Exam:BP 113/70 (BP Location: Left Arm, Patient Position: Sitting, Cuff Size: Normal)    Pulse 67    Ht 5' 3.5" (1.613 m)    Wt 175 lb 8 oz (79.6 kg)    BMI 30.60 kg/m   General:  Well developed, well nourished, no acute distress Skin:  Warm and dry Neck:  Midline trachea, normal thyroid, good ROM, no lymphadenopathy Lungs; Clear to auscultation bilaterally Breast:  No dominant palpable mass, retraction, or nipple discharge Cardiovascular: Regular rate and rhythm Abdomen:  Soft, non tender, no hepatosplenomegaly Pelvic:  External genitalia is normal in appearance, no lesions.  The vagina white discharge, without odor,CV swab obtained. Urethra has no lesions or masses. The cervix and uterus are absent. No adnexal masses or tenderness noted.Bladder is non tender, no masses felt. Rectal: Good sphincter tone, no polyps, or hemorrhoids felt.  Hemoccult negative.+mild rectocele Extremities/musculoskeletal:  No swelling or varicosities noted, no clubbing or cyanosis Psych:  No mood changes, alert and cooperative,seems happy AA is 0 Fall risk  is low Depression screen [facility] 2/9 07/25/2021 05/09/2020 10/16/2019  Decreased Interest 2 2 0  Down, Depressed, Hopeless 0 0 0  PHQ - 2 Score 2 2 0  Altered sleeping 3 3 -  Tired, decreased energy 3 3 -  Change in appetite 1 1 -  Feeling bad or failure about yourself  0 0 -  Trouble concentrating 0 1 -  Moving slowly or fidgety/restless 0 0 -  Suicidal thoughts 0 0 -  PHQ-9 Score 9 10 -  Difficult doing work/chores - - -  Some recent data might be hidden    GAD 7 : Generalized Anxiety Score 07/25/2021 05/09/2020  Nervous, Anxious, on Edge 3 3  Control/stop worrying 3 2  Worry too much - different things 3 2  Trouble relaxing 3 3  Restless 3 3  Easily annoyed or irritable 3 1  Afraid - awful might happen 0 0  Total GAD 7 Score 18 14      Upstream - 07/25/21 1033       Pregnancy Intention Screening   Does the patient want to become pregnant in the next year? N/A    Does the patient's partner want to become pregnant in the next year? N/A    Would the patient like to discuss contraceptive options today? N/A      Contraception Wrap Up   Current Method Female Sterilization   hyst   End Method Female Sterilization   hyst   Contraception Counseling Provided No            Examination chaperoned by Levy Pupa LPN  Impression and Plan: 1. Encounter for well woman exam with routine gynecological exam Physical in 1 year Labs with PCP Mammogram yearly Colonoscopy per GI  2. Encounter for screening fecal occult blood testing   3. Anxiety Will rx trazodone 50 mg at HS to see if helps with anxiety and sleep  Meds ordered this encounter  Medications   estradiol (ESTRACE) 2 MG tablet    Sig: Take 1 tablet (2 mg total) by mouth daily.    Dispense:  90 tablet    Refill:  3    Order Specific Question:   Supervising Provider    Answer:   Tania Ade H [[id number]]   traZODone (DESYREL) 50 MG tablet    Sig: Take 1 tablet (50 mg total) by mouth at bedtime.    Dispense:  30 tablet     Refill:  3    Order Specific Question:   Supervising Provider    Answer:   Tania Ade H [[id number]]    Follow up in 3 months   4. Current use of estrogen therapy Will refill estrace 2 mg 1 daily   5. Sleep disturbance Will rx trazodone  6. Vaginal discharge CV swab sent to rule out  BV and yeast  7. S/P hysterectomy No pap needed

## 2021-07-29 ENCOUNTER — Other Ambulatory Visit: Payer: Self-pay | Admitting: Adult Health

## 2021-07-29 LAB — CERVICOVAGINAL ANCILLARY ONLY
Bacterial Vaginitis (gardnerella): POSITIVE — AB
Candida Glabrata: NEGATIVE
Candida Vaginitis: NEGATIVE
Comment: NEGATIVE
Comment: NEGATIVE
Comment: NEGATIVE

## 2021-07-29 MED ORDER — METRONIDAZOLE 500 MG PO TABS: 500.0000 mg | ORAL_TABLET | Freq: Two times a day (BID) | ORAL | 0 refills | Status: DC

## 2021-07-29 NOTE — Progress Notes (Signed)
+  BV on vaginal swab will rx flagyl no sex or alcohol during treatment

## 2021-08-04 DIAGNOSIS — N951 Menopausal and female climacteric states: Secondary | ICD-10-CM | POA: Diagnosis not present

## 2021-08-04 DIAGNOSIS — E039 Hypothyroidism, unspecified: Secondary | ICD-10-CM | POA: Diagnosis not present

## 2021-08-08 DIAGNOSIS — E039 Hypothyroidism, unspecified: Secondary | ICD-10-CM | POA: Diagnosis not present

## 2021-08-08 DIAGNOSIS — N951 Menopausal and female climacteric states: Secondary | ICD-10-CM | POA: Diagnosis not present

## 2021-08-08 DIAGNOSIS — G479 Sleep disorder, unspecified: Secondary | ICD-10-CM | POA: Diagnosis not present

## 2021-08-08 DIAGNOSIS — R232 Flushing: Secondary | ICD-10-CM | POA: Diagnosis not present

## 2021-08-19 ENCOUNTER — Ambulatory Visit: Payer: BC Managed Care – PPO | Admitting: Adult Health

## 2021-08-19 ENCOUNTER — Other Ambulatory Visit: Payer: Self-pay

## 2021-08-19 ENCOUNTER — Encounter: Payer: Self-pay | Admitting: Adult Health

## 2021-08-19 VITALS — BP 95/58 | HR 65 | Ht 63.5 in | Wt 176.0 lb

## 2021-08-19 DIAGNOSIS — N898 Other specified noninflammatory disorders of vagina: Secondary | ICD-10-CM | POA: Insufficient documentation

## 2021-08-19 DIAGNOSIS — B3731 Acute candidiasis of vulva and vagina: Secondary | ICD-10-CM | POA: Insufficient documentation

## 2021-08-19 LAB — POCT WET PREP (WET MOUNT): WBC, Wet Prep HPF POC: POSITIVE

## 2021-08-19 MED ORDER — FLUCONAZOLE 150 MG PO TABS: ORAL_TABLET | ORAL | 2 refills | Status: DC

## 2021-08-19 NOTE — Progress Notes (Signed)
?  Subjective:  ?  ? [name], female   DOB: [birth year], 51 y.o.   MRN: [id number] ? ?HPI ?Yvette Mayer is a 51 year old black female, divorced, sp hysterectomy in complaining of vaginal irritation. She used monistat 1 and it burned. ?She has to start antibiotic for oral abscess, knows this will upset vagina in even more.  ? ? ?Review of Systems ?+vaginal irritation ?Reviewed past medical,surgical, social and family history. Reviewed medications and allergies.  ?   ?Objective:  ? Physical Exam ?BP (!) 95/58 (BP Location: Right Arm, Patient Position: Sitting, Cuff Size: Normal)   Pulse 65   Ht 5' 3.5" (1.613 m)   Wt 176 lb (79.8 kg)   BMI 30.69 kg/m?   ?  Skin warm and dry.Pelvic: external genitalia is normal in appearance no lesions, vagina: white discharge without odor,urethra has no lesions or masses noted, cervix and uterus are absent, adnexa: no masses or tenderness noted. Bladder is non tender and no masses felt. Wet prep: + yeast and WBC ?  ? Upstream - 08/19/21 1315   ? ?  ? Pregnancy Intention Screening  ? Does the patient want to become pregnant in the next year? N/A   ? Does the patient's partner want to become pregnant in the next year? N/A   ? Would the patient like to discuss contraceptive options today? N/A   ?  ? Contraception Wrap Up  ? Current Method --   hyst  ? End Method --   hyst  ? Contraception Counseling Provided No   ? ?  ?  ? ?  ? Pt gave permission for exam without chaperone. ?Assessment:  ?  1. Vaginal irritation ?Has yeast will rx diflucan ? ?2. Vaginal yeast infection ?Will rx diflucan ?Meds ordered this encounter  ?Medications  ? fluconazole (DIFLUCAN) 150 MG tablet  ?  Sig: Take 1 now and 1 in 3 days  ?  Dispense:  2 tablet  ?  Refill:  2  ?  Order Specific Question:   Supervising Provider  ?  Answer:   Tania Ade H [[id number]]  ?  ? ?3. Vaginal discharge ? ?   ?Plan:  ?   ?Follow up prn  ?   ?

## 2021-09-01 ENCOUNTER — Ambulatory Visit: Payer: BC Managed Care – PPO | Admitting: Nurse Practitioner

## 2021-09-09 ENCOUNTER — Ambulatory Visit: Payer: BC Managed Care – PPO | Admitting: Nurse Practitioner

## 2021-10-15 ENCOUNTER — Other Ambulatory Visit: Payer: Self-pay | Admitting: Adult Health

## 2021-10-16 ENCOUNTER — Ambulatory Visit: Payer: BC Managed Care – PPO | Admitting: Nurse Practitioner

## 2021-10-22 ENCOUNTER — Ambulatory Visit: Payer: BC Managed Care – PPO | Admitting: Adult Health

## 2021-10-27 ENCOUNTER — Other Ambulatory Visit: Payer: Self-pay | Admitting: Nurse Practitioner

## 2021-11-14 ENCOUNTER — Ambulatory Visit: Payer: BC Managed Care – PPO | Admitting: Adult Health

## 2021-11-27 ENCOUNTER — Ambulatory Visit: Payer: BC Managed Care – PPO | Admitting: Nurse Practitioner

## 2021-12-09 ENCOUNTER — Other Ambulatory Visit (HOSPITAL_COMMUNITY): Payer: Self-pay | Admitting: Internal Medicine

## 2021-12-09 ENCOUNTER — Other Ambulatory Visit (HOSPITAL_COMMUNITY): Payer: Self-pay | Admitting: Nurse Practitioner

## 2021-12-09 DIAGNOSIS — Z1231 Encounter for screening mammogram for malignant neoplasm of breast: Secondary | ICD-10-CM

## 2021-12-23 ENCOUNTER — Ambulatory Visit: Payer: BC Managed Care – PPO | Admitting: Nurse Practitioner

## 2022-01-06 ENCOUNTER — Encounter: Payer: Self-pay | Admitting: Nurse Practitioner

## 2022-01-06 ENCOUNTER — Ambulatory Visit: Payer: BC Managed Care – PPO | Admitting: Nurse Practitioner

## 2022-01-06 VITALS — BP 111/71 | HR 65 | Ht 63.0 in | Wt 175.0 lb

## 2022-01-06 DIAGNOSIS — M5030 Other cervical disc degeneration, unspecified cervical region: Secondary | ICD-10-CM

## 2022-01-06 DIAGNOSIS — E785 Hyperlipidemia, unspecified: Secondary | ICD-10-CM

## 2022-01-06 DIAGNOSIS — N898 Other specified noninflammatory disorders of vagina: Secondary | ICD-10-CM

## 2022-01-06 DIAGNOSIS — Z139 Encounter for screening, unspecified: Secondary | ICD-10-CM

## 2022-01-06 DIAGNOSIS — E559 Vitamin D deficiency, unspecified: Secondary | ICD-10-CM

## 2022-01-06 DIAGNOSIS — E669 Obesity, unspecified: Secondary | ICD-10-CM | POA: Diagnosis not present

## 2022-01-06 MED ORDER — CYCLOBENZAPRINE HCL 5 MG PO TABS: 5.0000 mg | ORAL_TABLET | Freq: Every evening | ORAL | 1 refills | Status: DC | PRN

## 2022-01-06 NOTE — Assessment & Plan Note (Signed)
Check lipid panle Last lipids Lab Results  Component Value Date   CHOL 233 (H) 10/21/2018   HDL 65 10/21/2018   LDLCALC 154 (H) 10/21/2018   TRIG 51 10/21/2018   CHOLHDL 3.6 10/21/2018

## 2022-01-06 NOTE — Assessment & Plan Note (Signed)
Chronic condition, takes aleeve PRN, sees a chiropractor Flexeril '5mg'$  at bedtime prn reorederd

## 2022-01-06 NOTE — Patient Instructions (Signed)
Please get your TDAP and shingles vaccine at the pharmacy as dicussed    It is important that you exercise regularly at least 30 minutes 5 times a week.  Think about what you will eat, plan ahead. Choose " clean, green, fresh or frozen" over canned, processed or packaged foods which are more sugary, salty and fatty. 70 to 75% of food eaten should be vegetables and fruit. Three meals at set times with snacks allowed between meals, but they must be fruit or vegetables. Aim to eat over a 12 hour period , example 7 am to 7 pm, and STOP after  your last meal of the day. Drink water,generally about 64 ounces per day, no other drink is as healthy. Fruit juice is best enjoyed in a healthy way, by EATING the fruit.  Thanks for choosing [facility], we consider it a privelige to serve you.

## 2022-01-06 NOTE — Assessment & Plan Note (Addendum)
Her goal is 150lb  Wt Readings from Last 3 Encounters:  01/06/22 175 lb (79.4 kg)  08/19/21 176 lb (79.8 kg)  07/25/21 175 lb 8 oz (79.6 kg)  patient counseled on low carb diet , encouraged to engage in regular moderate exercises at least 150 minutes weekly .

## 2022-01-06 NOTE — Assessment & Plan Note (Signed)
Check vitamin D  labs

## 2022-01-06 NOTE — Assessment & Plan Note (Signed)
On hormone therapy , takes estradiol '2mg'$  daily  cervicoaxillary swab performed , sample sent to the lab.

## 2022-01-06 NOTE — Progress Notes (Signed)
New Patient Office Visit  Subjective    [name], female    DOB: May 06, 1971  Age: 51 y.o. MRN: [id number]  CC:  Chief Complaint  Patient presents with   New Patient (Initial Visit)    np   Vaginal Discharge    For a couple of weeks    Neck Pain    Chronic     HPI Yvette Mayer with PMH of hot flashes, GERD, Degenerative cervical disc, vaginal yeast infection, hyperlipidemia, Fibromyalgia, Sjogren syndrome, BV, presents to establish care. Previous PCP Dr Yvette Mayer . Last CPE was done by the OBGYN in feb, 2023.   Pt c/o chronic bilateral neck pain,goes to the chiropractor, describes her pain as  throbbing aching burning pain . Pain more on the left, Takes aleve as needed.    Pt c/o whitish Vaginal discharge, vagina pain since last month, she denies fever , chills, malaise.  Due for shingles vaccine, TDAP vaccines, need for both vaccines dicussed, pt refused vaccination today. Has upcoming mammogram,. Uptodate with colonoscopy   Outpatient Encounter Medications as of 01/06/2022  Medication Sig   estradiol (ESTRACE) 2 MG tablet Take 1 tablet (2 mg total) by mouth daily.   Naproxen Sodium (ALEVE) 220 MG CAPS Take by mouth. As needed   cetirizine (ZYRTEC ALLERGY) 10 MG tablet Take 1 tablet (10 mg total) by mouth daily. (Patient not taking: Reported on 08/19/2021)   [employer] DIGESTIVE PROBIOTIC 250 MG capsule Take 250 mg by mouth 2 (two) times daily. (Patient not taking: Reported on 08/19/2021)   cyclobenzaprine (FLEXERIL) 5 MG tablet Take 1 tablet (5 mg total) by mouth at bedtime as needed for muscle spasms.   guaiFENesin (ROBITUSSIN) 100 MG/5ML liquid Take 200 mg by mouth 3 (three) times daily as needed for cough. (Patient not taking: Reported on 08/19/2021)   pantoprazole (PROTONIX) 40 MG tablet Take 1 tablet (40 mg total) by mouth daily. (Patient not taking: Reported on 01/06/2022)   progesterone (PROMETRIUM) 100 MG capsule Take 100 mg by mouth daily. (Patient not taking:  Reported on 01/06/2022)   [DISCONTINUED] cyclobenzaprine (FLEXERIL) 5 MG tablet Take 1 tablet (5 mg total) by mouth at bedtime as needed for muscle spasms. (Patient not taking: Reported on 08/19/2021)   [DISCONTINUED] fluconazole (DIFLUCAN) 150 MG tablet Take 1 now and 1 in 3 days (Patient not taking: Reported on 01/06/2022)   [DISCONTINUED] metroNIDAZOLE (FLAGYL) 500 MG tablet Take 1 tablet (500 mg total) by mouth 2 (two) times daily. (Patient not taking: Reported on 08/19/2021)   [DISCONTINUED] thyroid (ARMOUR) 30 MG tablet Take 30 mg by mouth daily before breakfast. (Patient not taking: Reported on 01/06/2022)   [DISCONTINUED] traZODone (DESYREL) 50 MG tablet Take 1 tablet (50 mg total) by mouth at bedtime. (Patient not taking: Reported on 08/19/2021)   No facility-administered encounter medications on file as of 01/06/2022.    Past Medical History:  Diagnosis Date   Abdominal pain 06/12/2014   Allergy    Anemia    iron 1 yr ago, normal hgb 7/12   Anxiety    Asthma    pt says no-bronchitis   Bloated abdomen 10/15/2015   Chronic back pain    Chronic headaches    Chronic neck pain    Chronic sinusitis    Current use of estrogen therapy 04/16/2015   Dyspareunia 09/12/2014   Elevated TSH 04/16/2015   Fatigue 04/10/2015   Fibromyalgia    Gas 10/15/2015   GERD (gastroesophageal reflux disease)  Herpes simplex virus (HSV) infection    History of kidney stones    Hot flashes 04/10/2015   Hyperlipidemia    Kidney stone on right side 10/24/2015   Left-sided weakness    Mental disorder    Ovarian cyst, right    Paresthesias    Shortness of breath    Sinus drainage    Sjoegren syndrome    Symptoms, such as flushing, sleeplessness, headache, lack of concentration, associated with the menopause 04/10/2015   TMJ (dislocation of temporomandibular joint)    Vaginal discharge 08/08/2015   Vertigo    Yeast infection 10/15/2015    Past Surgical History:  Procedure Laterality Date   ABDOMINAL  HYSTERECTOMY     BIOPSY  09/09/2016   Procedure: BIOPSY;  Surgeon: Daneil Dolin, MD;  [facility]: [facility];  Service: Endoscopy;;  esophageal   BREAST CYST EXCISION Right 1980   benign   CESAREAN SECTION     x2   CHOLECYSTECTOMY  2005   cholelithiasis   COLONOSCOPY N/A 09/09/2016   Procedure: COLONOSCOPY;  Surgeon: Daneil Dolin, MD;  Location: [facility];  Service: Endoscopy;  Laterality: N/A;  8:30am   ESOPHAGOGASTRODUODENOSCOPY  01/23/2011   JHE:RDEYCXKGYJE undulating Z-line vs short segment Barrett s/p bx (NOT BARRETT's)/small HH otherwise normal   ESOPHAGOGASTRODUODENOSCOPY N/A 09/09/2016   Procedure: ESOPHAGOGASTRODUODENOSCOPY (EGD);  Surgeon: Daneil Dolin, MD;  Location: [facility];  Service: Endoscopy;  Laterality: N/A;   ESOPHAGOGASTRODUODENOSCOPY (EGD) WITH PROPOFOL N/A 08/08/2020   Procedure: ESOPHAGOGASTRODUODENOSCOPY (EGD) WITH PROPOFOL;  Surgeon: Daneil Dolin, MD;  [facility]: [facility];  Service: Endoscopy;  Laterality: N/A;  PM (ASA 3), pt tested + 2/7 <90 days   MALONEY DILATION  01/23/2011   Procedure: MALONEY DILATION;  Surgeon: Daneil Dolin, MD;  [facility]: [facility];  Service: Endoscopy;  Laterality: N/A;   MALONEY DILATION N/A 09/09/2016   Procedure: Venia Minks DILATION;  Surgeon: Daneil Dolin, MD;  Location: [facility];  Service: Endoscopy;  Laterality: N/A;   MALONEY DILATION N/A 08/08/2020   Procedure: Venia Minks DILATION;  Surgeon: Daneil Dolin, MD;  Location: [facility];  Service: Endoscopy;  Laterality: N/A;   OVARIAN CYST REMOVAL  92 removal of cysts from behind ovaries   PARTIAL HYSTERECTOMY  93   SEPTOPLASTY     2011   TEMPOROMANDIBULAR JOINT SURGERY     TONSILLECTOMY     2010   TURBINATE RESECTION Bilateral 05/29/2013   Procedure: TURBINATE RESECTION;  Surgeon: Ascencion Dike, MD;  Location: [place];  Service: ENT;  Laterality: Bilateral;   UPPER GASTROINTESTINAL ENDOSCOPY      Family History  Problem Relation Age  of Onset   Hypertension Mother    Thyroid disease Sister        overactive   Asthma Sister    Eczema Sister    Bronchitis Son    Heart disease Maternal Grandmother    Hypertension Maternal Grandmother    Alcohol abuse Maternal Grandfather    Scoliosis Son    Other Maternal Aunt        Sjeogren syndrome   Other Maternal Aunt        Sjoegren syndrome   Other Maternal Aunt        Sjoegren syndrome   Anesthesia problems Neg Hx    Malignant hyperthermia Neg Hx    Hypotension Neg Hx    Pseudochol deficiency Neg Hx    Colon cancer Neg Hx  Social History   Socioeconomic History   Marital status: Divorced    Spouse name: Not on file   Number of children: 3   Years of education: Not on file   Highest education level: Not on file  Occupational History   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    Social Determinants of Health   Financial Resource Strain: Medium Risk (07/25/2021)   Overall Financial Resource Strain (CARDIA)    Difficulty of Paying Living Expenses: Somewhat hard  Food Insecurity: No Food Insecurity (07/25/2021)   Hunger Vital Sign    Worried About Running Out of Food in the Last Year: Never true    Ran Out of Food in the Last Year: Never true  Transportation Needs: No Transportation Needs (07/25/2021)   PRAPARE - Hydrologist (Medical): No    Lack of Transportation (Non-Medical): No  Physical Activity: Sufficiently Active (07/25/2021)   Exercise Vital Sign    Days of Exercise per Week: 4 days    Minutes of Exercise per Session: 50 min  Stress: Stress Concern Present (07/25/2021)   Mercedes    Feeling of Stress : Very much  Social Connections: Moderately Isolated (07/25/2021)   Social Connection and Isolation Panel [NHANES]    Frequency of Communication with Friends and Family: More than three times a week    Frequency of Social Gatherings with Friends and Family: Once a week    Attends Religious Services: More than 4 times per year    Active Member of [employer] or Organizations: No    Attends Archivist Meetings: Never    Marital Status: Divorced  Human resources officer Violence: Not At Risk (07/25/2021)   Humiliation, Afraid, Rape, and Kick questionnaire    Fear of Current or Ex-Partner: No    Emotionally Abused: No    Physically Abused: No    Sexually Abused: No    Review of Systems  Constitutional:  Negative for chills, fever, malaise/fatigue and weight loss.  Respiratory: Negative.  Negative for cough, hemoptysis, sputum production and shortness of breath.   Cardiovascular: Negative.  Negative for chest pain, palpitations, orthopnea and claudication.  Genitourinary:  Negative for dysuria, flank pain, frequency and hematuria.  Musculoskeletal:  Positive for myalgias and neck pain. Negative for back pain and falls.  Neurological: Negative.  Negative for dizziness and headaches.  Psychiatric/Behavioral: Negative.  Negative for depression, hallucinations, substance abuse and suicidal ideas.         Objective    BP 111/71 (BP Location: Right Arm, Patient Position: Sitting, Cuff Size: Normal)   Pulse 65   Ht '5\' 3"'$  (1.6 m)   Wt 175 lb (79.4 kg)   SpO2 96%   BMI 31.00 kg/m   Physical Exam Exam conducted with a chaperone present.  Constitutional:      General: She is not in acute distress.    Appearance: She is not ill-appearing, toxic-appearing or diaphoretic.  Cardiovascular:     Rate and Rhythm: Normal rate and regular rhythm.  Pulses: Normal pulses.     Heart sounds: Normal heart  sounds. No murmur heard.    No friction rub. No gallop.  Pulmonary:     Effort: Pulmonary effort is normal. No respiratory distress.     Breath sounds: Normal breath sounds. No stridor. No wheezing, rhonchi or rales.  Chest:     Chest wall: No tenderness.  Abdominal:     Palpations: Abdomen is soft.     Tenderness: There is no abdominal tenderness.     Hernia: There is no hernia in the left inguinal area or right inguinal area.  Genitourinary:    General: Normal vulva.     Exam position: Lithotomy position.     Tanner stage (genital): 5.     Labia:        Right: No rash, tenderness, lesion or injury.        Left: No rash, tenderness, lesion or injury.      Urethra: No prolapse, urethral pain, urethral swelling or urethral lesion.     Vagina: No signs of injury and foreign body. Vaginal discharge and tenderness present. No bleeding, lesions or prolapsed vaginal walls.     Comments: petechia of vagina wall, whitish discharge noted, tenderness on palpation of vagina wall, no masses or ulcer noted, no bleeding noted.  Musculoskeletal:        General: Tenderness present. No swelling, deformity or signs of injury. Normal range of motion.     Right lower leg: No edema.     Left lower leg: No edema.     Comments: Tenderness on palpation of left side of the neck   Lymphadenopathy:     Lower Body: No right inguinal adenopathy. No left inguinal adenopathy.  Skin:    General: Skin is warm and dry.     Capillary Refill: Capillary refill takes less than 2 seconds.     Coloration: Skin is not jaundiced or pale.     Findings: No bruising, erythema or lesion.  Neurological:     Mental Status: She is alert and oriented to person, place, and time.     Cranial Nerves: No cranial nerve deficit.     Sensory: No sensory deficit.     Motor: No weakness.     Coordination: Coordination normal.  Psychiatric:        Mood and Affect: Mood normal.        Behavior: Behavior normal.        Thought Content:  Thought content normal.        Judgment: Judgment normal.         Assessment & Plan:   Problem List Items Addressed This Visit       Musculoskeletal and Integument   Degenerative cervical disc    Chronic condition, takes aleeve PRN, sees a chiropractor Flexeril '5mg'$  at bedtime prn reorederd      Relevant Medications   Naproxen Sodium (ALEVE) 220 MG CAPS   cyclobenzaprine (FLEXERIL) 5 MG tablet     Other   Vaginal irritation - Primary    On hormone therapy , takes estradiol '2mg'$  daily  cervicoaxillary swab performed , sample sent to the lab.       Relevant Orders   Cervicovaginal ancillary only   NuSwab Vaginitis Plus (VG+)   Obesity (BMI 30-39.9)    Her goal is 150lb       Relevant Orders   Lipid Profile   HgB A1c   TSH   Other Visit Diagnoses  Screening due       Relevant Orders   Lipid Profile   Vitamin D (25 hydroxy)   HgB A1c   TSH       No follow-ups on file.   Renee Rival, FNP

## 2022-01-09 LAB — NUSWAB VAGINITIS PLUS (VG+)
Candida albicans, NAA: NEGATIVE
Candida glabrata, NAA: NEGATIVE
Chlamydia trachomatis, NAA: NEGATIVE
Neisseria gonorrhoeae, NAA: NEGATIVE
Trich vag by NAA: NEGATIVE

## 2022-01-09 NOTE — Progress Notes (Signed)
Normal labs.

## 2022-01-26 ENCOUNTER — Ambulatory Visit (HOSPITAL_COMMUNITY): Payer: BC Managed Care – PPO

## 2022-02-09 ENCOUNTER — Ambulatory Visit (HOSPITAL_COMMUNITY)
Admission: RE | Admit: 2022-02-09 | Discharge: 2022-02-09 | Disposition: A | Discharge status: Home / Self Care | Payer: BC Managed Care – PPO | Source: Ambulatory Visit | Attending: Nurse Practitioner | Admitting: Nurse Practitioner

## 2022-02-09 DIAGNOSIS — Z1231 Encounter for screening mammogram for malignant neoplasm of breast: Secondary | ICD-10-CM | POA: Diagnosis not present

## 2022-02-11 NOTE — Progress Notes (Signed)
Normal mammogram repeat screening in 1 year

## 2022-04-09 ENCOUNTER — Ambulatory Visit: Payer: BC Managed Care – PPO | Admitting: Family Medicine

## 2022-04-09 ENCOUNTER — Encounter (HOSPITAL_COMMUNITY): Payer: Self-pay | Admitting: Emergency Medicine

## 2022-04-09 ENCOUNTER — Emergency Department (HOSPITAL_COMMUNITY)
Admission: EM | Admit: 2022-04-09 | Discharge: 2022-04-09 | Disposition: A | Discharge status: Home / Self Care | Payer: BC Managed Care – PPO | Attending: Emergency Medicine | Admitting: Emergency Medicine

## 2022-04-09 ENCOUNTER — Emergency Department (HOSPITAL_COMMUNITY): Payer: BC Managed Care – PPO

## 2022-04-09 ENCOUNTER — Encounter: Payer: Self-pay | Admitting: Family Medicine

## 2022-04-09 ENCOUNTER — Other Ambulatory Visit: Payer: Self-pay

## 2022-04-09 VITALS — BP 123/88 | HR 65 | Ht 64.0 in | Wt 172.1 lb

## 2022-04-09 DIAGNOSIS — M545 Low back pain, unspecified: Secondary | ICD-10-CM | POA: Diagnosis not present

## 2022-04-09 DIAGNOSIS — Z1159 Encounter for screening for other viral diseases: Secondary | ICD-10-CM

## 2022-04-09 DIAGNOSIS — J0110 Acute frontal sinusitis, unspecified: Secondary | ICD-10-CM

## 2022-04-09 DIAGNOSIS — E039 Hypothyroidism, unspecified: Secondary | ICD-10-CM

## 2022-04-09 DIAGNOSIS — R7301 Impaired fasting glucose: Secondary | ICD-10-CM

## 2022-04-09 DIAGNOSIS — G43909 Migraine, unspecified, not intractable, without status migrainosus: Secondary | ICD-10-CM | POA: Insufficient documentation

## 2022-04-09 DIAGNOSIS — M5030 Other cervical disc degeneration, unspecified cervical region: Secondary | ICD-10-CM

## 2022-04-09 DIAGNOSIS — M542 Cervicalgia: Secondary | ICD-10-CM | POA: Insufficient documentation

## 2022-04-09 DIAGNOSIS — R519 Headache, unspecified: Secondary | ICD-10-CM | POA: Diagnosis not present

## 2022-04-09 DIAGNOSIS — Z114 Encounter for screening for human immunodeficiency virus [HIV]: Secondary | ICD-10-CM | POA: Diagnosis not present

## 2022-04-09 DIAGNOSIS — F419 Anxiety disorder, unspecified: Secondary | ICD-10-CM | POA: Diagnosis not present

## 2022-04-09 DIAGNOSIS — E7849 Other hyperlipidemia: Secondary | ICD-10-CM

## 2022-04-09 DIAGNOSIS — E559 Vitamin D deficiency, unspecified: Secondary | ICD-10-CM | POA: Diagnosis not present

## 2022-04-09 DIAGNOSIS — B9689 Other specified bacterial agents as the cause of diseases classified elsewhere: Secondary | ICD-10-CM

## 2022-04-09 DIAGNOSIS — Z1152 Encounter for screening for COVID-19: Secondary | ICD-10-CM | POA: Insufficient documentation

## 2022-04-09 DIAGNOSIS — J329 Chronic sinusitis, unspecified: Secondary | ICD-10-CM

## 2022-04-09 LAB — RESP PANEL BY RT-PCR (FLU A&B, COVID) ARPGX2
Influenza A by PCR: NEGATIVE
Influenza B by PCR: NEGATIVE
SARS Coronavirus 2 by RT PCR: NEGATIVE

## 2022-04-09 MED ORDER — KETOROLAC TROMETHAMINE 15 MG/ML IJ SOLN
15.0000 mg | Freq: Once | INTRAMUSCULAR | Status: AC
  Administered 2022-04-09: 15 mg via INTRAMUSCULAR
  Filled 2022-04-09: qty 1

## 2022-04-09 MED ORDER — AMOXICILLIN-POT CLAVULANATE 875-125 MG PO TABS: 1.0000 | ORAL_TABLET | Freq: Two times a day (BID) | ORAL | 0 refills | Status: AC

## 2022-04-09 MED ORDER — SERTRALINE HCL 25 MG PO TABS: 25.0000 mg | ORAL_TABLET | Freq: Every day | ORAL | 0 refills | Status: DC

## 2022-04-09 MED ORDER — CYCLOBENZAPRINE HCL 5 MG PO TABS: 5.0000 mg | ORAL_TABLET | Freq: Every evening | ORAL | 1 refills | Status: DC | PRN

## 2022-04-09 NOTE — Discharge Instructions (Signed)
You can take 600 mg of ibuprofen every 6 hours as needed for headache.  I would follow-up with your primary care doctor for further evaluation.  You may return to the emergency department for any worsening symptoms you might have.

## 2022-04-09 NOTE — Progress Notes (Signed)
Acute Office Visit  Subjective:     [name], female    DOB: 1970/07/09, 51 y.o.   MRN: [id number]  Chief Complaint  Patient presents with   Nasal Congestion    Pt reports nasal congestions and left ear pain, it all started last week 04/02/2022 with a sore throat and progressed into nasal congestion, reports neck pain, and headaches been under a lot of stress.     HPI Patient is in today for   Sinusitis: Onset of symptoms 04/02/2022.  She complains of nasal congestion, sinus pressure, Pain, fever of 103.0, cough, sinus headache, and hoarseness.  She reports taking Tylenol for fever and over-the-counter Mucinex cold and cough relief.  Anxiety: She reports increased worrying and feeling tense.  She denies suicidal ideation or homicidal ideation.  She reports speaking with a therapist at work and would like to be started on pharmacological therapy today.  Chronic left-sided neck pain: She reports that she stopped visiting the chiropractor about 3 months ago and has not taken her Flexeril or naproxen for her neck pain.  She reports that her leg pain is related to increased rates and tension in her body.  She complains of tightness in her neck and shoulders.    Review of Systems  Constitutional:  Negative for chills and fever.  HENT:  Positive for congestion, sinus pain and sore throat. Negative for ear discharge, hearing loss and nosebleeds.   Respiratory:  Positive for cough. Negative for sputum production and wheezing.   Cardiovascular:  Negative for chest pain and palpitations.  Musculoskeletal:  Positive for neck pain.  Neurological:  Negative for dizziness.  Psychiatric/Behavioral:  Negative for memory loss and suicidal ideas.         Objective:    BP 123/88   Pulse 65   Ht _0  (1.626 m)   Wt 172 lb 1.9 oz (78.1 kg)   SpO2 98%   BMI 29.54 kg/m    Physical Exam HENT:     Head: Normocephalic.     Nose:     Right Sinus: Frontal sinus tenderness  present.     Left Sinus: Frontal sinus tenderness present.  Cardiovascular:     Rate and Rhythm: Normal rate and regular rhythm.     Pulses: Normal pulses.     Heart sounds: Normal heart sounds.  Pulmonary:     Effort: Pulmonary effort is normal.     Breath sounds: Normal breath sounds.  Musculoskeletal:     Cervical back: No edema. Normal range of motion.  Neurological:     Mental Status: She is alert.     Results for orders placed or performed during the hospital encounter of 04/09/22  Resp Panel by RT-PCR (Flu A&B, Covid) Anterior Nasal Swab   Specimen: Anterior Nasal Swab  Result Value Ref Range   SARS Coronavirus 2 by RT PCR NEGATIVE NEGATIVE   Influenza A by PCR NEGATIVE NEGATIVE   Influenza B by PCR NEGATIVE NEGATIVE        Assessment & Plan:   Problem List Items Addressed This Visit       Respiratory   Sinusitis, bacterial    We will treat with Augmentin twice daily for 5 days Encouraged symptomatic management with over-the-counter Mucinex for cough and cold relief Encouraged to take Tylenol as needed for fever, body aches, and headaches Encouraged use of saline nasal spray and a humidifier for nasal congestion       Relevant Medications  amoxicillin-clavulanate (AUGMENTIN) 875-125 MG tablet     Musculoskeletal and Integument   Degenerative cervical disc    Encourage patient to resume therapy with chiropractor Encouraged to take Flexeril as needed for muscle spasm and musculoskeletal pain Encouraged to take over-the-counter analgesic for pain relief       Relevant Medications   cyclobenzaprine (FLEXERIL) 5 MG tablet     Other   Hyperlipidemia   Relevant Orders   Lipid panel   CMP14+EGFR   CBC with Differential/Platelet   Vitamin D deficiency   Relevant Orders   VITAMIN D 25 Hydroxy (Vit-D Deficiency, Fractures)   Anxiety - Primary    History of anxiety since 2013 She has not been on pharmacological treatment since We will start patient on  Zoloft 25 mg daily today She denies suicidal ideation homicidal ideation We will follow-up in 6 weeks      Relevant Medications   sertraline (ZOLOFT) 25 MG tablet   Other Visit Diagnoses     Subacute frontal sinusitis       Relevant Medications   amoxicillin-clavulanate (AUGMENTIN) 875-125 MG tablet   IFG (impaired fasting glucose)       Relevant Orders   Hemoglobin A1c   Need for hepatitis C screening test       Relevant Orders   Hepatitis C antibody   Encounter for screening for HIV       Relevant Orders   HIV Antibody (routine testing w rflx)   Hypothyroidism, unspecified type       Relevant Orders   TSH + free T4       Meds ordered this encounter  Medications   amoxicillin-clavulanate (AUGMENTIN) 875-125 MG tablet    Sig: Take 1 tablet by mouth 2 (two) times daily for 5 days.    Dispense:  10 tablet    Refill:  0   sertraline (ZOLOFT) 25 MG tablet    Sig: Take 1 tablet (25 mg total) by mouth daily.    Dispense:  30 tablet    Refill:  0   cyclobenzaprine (FLEXERIL) 5 MG tablet    Sig: Take 1 tablet (5 mg total) by mouth at bedtime as needed for muscle spasms.    Dispense:  30 tablet    Refill:  1    Return in about 6 weeks (around 05/21/2022) for anxiety and depression.  Alvira Monday, FNP

## 2022-04-09 NOTE — ED Notes (Signed)
Pt returned from ct. Nad.

## 2022-04-09 NOTE — Assessment & Plan Note (Signed)
History of anxiety since 2013 She has not been on pharmacological treatment since We will start patient on Zoloft 25 mg daily today She denies suicidal ideation homicidal ideation We will follow-up in 6 weeks

## 2022-04-09 NOTE — Assessment & Plan Note (Signed)
We will treat with Augmentin twice daily for 5 days Encouraged symptomatic management with over-the-counter Mucinex for cough and cold relief Encouraged to take Tylenol as needed for fever, body aches, and headaches Encouraged use of saline nasal spray and a humidifier for nasal congestion

## 2022-04-09 NOTE — Patient Instructions (Signed)
I appreciate the opportunity to provide care to you today!    Follow up:  6 weeks  Labs: please stop by the lab today to get your blood drawn (CBC, CMP, TSH, Lipid profile, HgA1c, Vit D)  Screening: HIV and Hep C  Please pick up your medications at the pharmacy    Please continue to a heart-healthy diet and increase your physical activities. Try to exercise for 4mns at least three times a week.      It was a pleasure to see you and I look forward to continuing to work together on your health and well-being. Please do not hesitate to call the office if you need care or have questions about your care.   Have a wonderful day and week. With Gratitude, GAlvira MondayMSN, FNP-BC

## 2022-04-09 NOTE — Assessment & Plan Note (Signed)
Encourage patient to resume therapy with chiropractor Encouraged to take Flexeril as needed for muscle spasm and musculoskeletal pain Encouraged to take over-the-counter analgesic for pain relief

## 2022-04-09 NOTE — ED Triage Notes (Signed)
Migraine x 2 days ago, none today, states has been having pain from neck all the way down back, states it is tightness to sides of neck. Otc med with no relief. Nad. Color wnl. Rom wnl.

## 2022-04-09 NOTE — ED Provider Notes (Signed)
[facility] EMERGENCY DEPARTMENT Provider Note   CSN: [id number] Arrival date & time: 04/09/22  5809     History Chief Complaint  Patient presents with   Back Pain   Neck Pain   Migraine    JEANETTE RAUTH is a 51 y.o. female patient presents to the emergency room today for further evaluation of a headache that is been waxing and waning since Monday.  Patient does not have a history of headache or migraines.  She does state that she is getting over an upper respiratory infection.  She has been taking over-the-counter antitussives.  She is also complaining of neck pain.  Denies fever, chills, nausea, vomiting, abdominal pain, chest pain, short of breath.  She is denies focal weakness or numbness.   Back Pain Neck Pain Migraine       Home Medications Prior to Admission medications   Medication Sig Start Date End Date Taking? Authorizing Provider  amoxicillin-clavulanate (AUGMENTIN) 875-125 MG tablet Take 1 tablet by mouth 2 (two) times daily for 5 days. 04/09/22 04/14/22  Alvira Monday, FNP  cetirizine (ZYRTEC ALLERGY) 10 MG tablet Take 1 tablet (10 mg total) by mouth daily. Patient not taking: Reported on 04/09/2022 09/03/20   Eulogio Bear, NP  [facility] DIGESTIVE PROBIOTIC 250 MG capsule Take 250 mg by mouth 2 (two) times daily. 03/05/21   [provider]  cyclobenzaprine (FLEXERIL) 5 MG tablet Take 1 tablet (5 mg total) by mouth at bedtime as needed for muscle spasms. 04/09/22   Alvira Monday, FNP  pantoprazole (PROTONIX) 40 MG tablet Take 1 tablet (40 mg total) by mouth daily. Patient not taking: Reported on 04/09/2022 02/28/21   Mahala Menghini, PA-C  sertraline (ZOLOFT) 25 MG tablet Take 1 tablet (25 mg total) by mouth daily. 04/09/22   Alvira Monday, FNP      Allergies    Plaquenil [hydroxychloroquine sulfate]    Review of Systems   Review of Systems  Musculoskeletal:  Positive for back pain and neck pain.  All other systems reviewed and are  negative.   Physical Exam Updated Vital Signs BP 107/80 (BP Location: Right Arm)   Pulse 66   Temp 98 F (36.7 C) (Oral)   Resp 17   SpO2 99%  Physical Exam Vitals and nursing note reviewed.  Constitutional:      Appearance: Normal appearance.  HENT:     Head: Normocephalic and atraumatic.  Eyes:     General:        Right eye: No discharge.        Left eye: No discharge.     Conjunctiva/sclera: Conjunctivae normal.  Neck:     Meningeal: Brudzinski's sign absent.     Comments: No meningismus Pulmonary:     Effort: Pulmonary effort is normal.  Musculoskeletal:     Cervical back: Full passive range of motion without pain.  Skin:    General: Skin is warm and dry.     Findings: No rash.  Neurological:     General: No focal deficit present.     Mental Status: She is alert.     Comments: Cranial nerves II through XII are intact.  5/5 strength to the upper and lower extremities normal sensation to the upper and lower extremity.  Psychiatric:        Mood and Affect: Mood normal.        Behavior: Behavior normal.     ED Results / Procedures / Treatments   Labs (all labs ordered  are listed, but only abnormal results are displayed) Labs Reviewed  RESP PANEL BY RT-PCR (FLU A&B, COVID) [id number]    EKG None  Radiology CT Head Wo Contrast  Result Date: 04/09/2022 CLINICAL DATA:  Headache and neck pain for 2 days ago, no known injury EXAM: CT HEAD WITHOUT CONTRAST CT CERVICAL SPINE WITHOUT CONTRAST TECHNIQUE: Multidetector CT imaging of the head and cervical spine was performed following the standard protocol without intravenous contrast. Multiplanar CT image reconstructions of the cervical spine were also generated. RADIATION DOSE REDUCTION: This exam was performed according to the departmental dose-optimization program which includes automated exposure control, adjustment of the mA and/or kV according to patient size and/or use of iterative reconstruction technique. COMPARISON:   03/19/2020 FINDINGS: CT HEAD FINDINGS Brain: No evidence of acute infarction, hemorrhage, hydrocephalus, extra-axial collection or mass lesion/mass effect. Vascular: No hyperdense vessel or unexpected calcification. Skull: Normal. Negative for fracture or focal lesion. Sinuses/Orbits: No acute finding. Other: None. CT CERVICAL SPINE FINDINGS Alignment: Straightening and reversal of the normal cervical lordosis. Skull base and vertebrae: No acute fracture. No primary bone lesion or focal pathologic process. Soft tissues and spinal canal: No prevertebral fluid or swelling. No visible canal hematoma. Disc levels: Mild disc space height loss and osteophytosis C5-C6 with otherwise preserved disc spaces. Upper chest: Negative. Other: None. IMPRESSION: 1. No acute intracranial pathology. 2. No fracture or static subluxation of the cervical spine. 3. Mild disc space height loss and osteophytosis C5-C6 with otherwise preserved disc spaces. Electronically Signed   By: Delanna Ahmadi M.D.   On: 04/09/2022 12:21   CT Cervical Spine Wo Contrast  Result Date: 04/09/2022 CLINICAL DATA:  Headache and neck pain for 2 days ago, no known injury EXAM: CT HEAD WITHOUT CONTRAST CT CERVICAL SPINE WITHOUT CONTRAST TECHNIQUE: Multidetector CT imaging of the head and cervical spine was performed following the standard protocol without intravenous contrast. Multiplanar CT image reconstructions of the cervical spine were also generated. RADIATION DOSE REDUCTION: This exam was performed according to the departmental dose-optimization program which includes automated exposure control, adjustment of the mA and/or kV according to patient size and/or use of iterative reconstruction technique. COMPARISON:  03/19/2020 FINDINGS: CT HEAD FINDINGS Brain: No evidence of acute infarction, hemorrhage, hydrocephalus, extra-axial collection or mass lesion/mass effect. Vascular: No hyperdense vessel or unexpected calcification. Skull: Normal. Negative for  fracture or focal lesion. Sinuses/Orbits: No acute finding. Other: None. CT CERVICAL SPINE FINDINGS Alignment: Straightening and reversal of the normal cervical lordosis. Skull base and vertebrae: No acute fracture. No primary bone lesion or focal pathologic process. Soft tissues and spinal canal: No prevertebral fluid or swelling. No visible canal hematoma. Disc levels: Mild disc space height loss and osteophytosis C5-C6 with otherwise preserved disc spaces. Upper chest: Negative. Other: None. IMPRESSION: 1. No acute intracranial pathology. 2. No fracture or static subluxation of the cervical spine. 3. Mild disc space height loss and osteophytosis C5-C6 with otherwise preserved disc spaces. Electronically Signed   By: Delanna Ahmadi M.D.   On: 04/09/2022 12:21    Procedures Procedures    Medications Ordered in ED Medications  ketorolac (TORADOL) 15 MG/ML injection 15 mg (15 mg Intramuscular Given 04/09/22 1134)    ED Course/ Medical Decision Making/ A&P Clinical Course as of 04/09/22 1412  Thu Apr 09, 2022  1406 On reevaluation, patient is currently headache free.  I went over all labs and imaging with her at the bedside. [CF]  1406 I personally ordered and interpreted CT scans of the  cervical spine and head.  Did not see any evidence of intracranial hemorrhage or fractures. I do agree with the radiologist interpretation.  [CF]  1408 Resp Panel by RT-PCR (Flu A&B, Covid) Anterior Nasal Swab Normal.  [CF]    Clinical Course User Index [CF] Hendricks Limes, PA-C                           Medical Decision Making MARI BATTAGLIA is a 51 y.o. female patient who presents to the emergency department today for further evaluation of headache.  Seen this patient does not carry a history of migraines or headaches I am going to get a CT scan of the head and neck to further evaluate.  Have a low suspicion for meningitis at this time.  I doubt stroke.  I will also give her some Toradol.  I will plan to  reassess once some of the labs and imaging result.  As highlighted in the ED course, patient is currently headache free.  I do not see any evidence of emergent pathology based on the CT scans.  I will have her follow-up with her primary care doctor.  Strict return precautions were discussed.  She is safe for discharge.  Amount and/or Complexity of Data Reviewed Labs:  Decision-making details documented in ED Course. Radiology: ordered.  Risk Prescription drug management.    Final Clinical Impression(s) / ED Diagnoses Final diagnoses:  Nonintractable episodic headache, unspecified headache type    Rx / DC Orders ED Discharge Orders     None         Hendricks Limes, [place] 04/09/22 1412    Wyvonnia Dusky, MD 04/09/22 1650

## 2022-04-10 LAB — CBC WITH DIFFERENTIAL/PLATELET
Basophils Absolute: 0.1 10*3/uL (ref 0.0–0.2)
Basos: 2 %
EOS (ABSOLUTE): 0.1 10*3/uL (ref 0.0–0.4)
Eos: 1 %
Hematocrit: 42.5 % (ref 34.0–46.6)
Hemoglobin: 14 g/dL (ref 11.1–15.9)
Immature Grans (Abs): 0 10*3/uL (ref 0.0–0.1)
Immature Granulocytes: 0 %
Lymphocytes Absolute: 2.1 10*3/uL (ref 0.7–3.1)
Lymphs: 37 %
MCH: 30.2 pg (ref 26.6–33.0)
MCHC: 32.9 g/dL (ref 31.5–35.7)
MCV: 92 fL (ref 79–97)
Monocytes Absolute: 0.4 10*3/uL (ref 0.1–0.9)
Monocytes: 6 %
Neutrophils Absolute: 3 10*3/uL (ref 1.4–7.0)
Neutrophils: 54 %
Platelets: 414 10*3/uL (ref 150–450)
RBC: 4.63 x10E6/uL (ref 3.77–5.28)
RDW: 12.5 % (ref 11.7–15.4)
WBC: 5.7 10*3/uL (ref 3.4–10.8)

## 2022-04-10 LAB — CMP14+EGFR
ALT: 15 IU/L (ref 0–32)
AST: 20 IU/L (ref 0–40)
Albumin/Globulin Ratio: 1.2 (ref 1.2–2.2)
Albumin: 4.4 g/dL (ref 3.8–4.9)
Alkaline Phosphatase: 88 IU/L (ref 44–121)
BUN/Creatinine Ratio: 12 (ref 9–23)
BUN: 10 mg/dL (ref 6–24)
Bilirubin Total: 0.3 mg/dL (ref 0.0–1.2)
CO2: 23 mmol/L (ref 20–29)
Calcium: 9.9 mg/dL (ref 8.7–10.2)
Chloride: 101 mmol/L (ref 96–106)
Creatinine, Ser: 0.85 mg/dL (ref 0.57–1.00)
Globulin, Total: 3.7 g/dL (ref 1.5–4.5)
Glucose: 97 mg/dL (ref 70–99)
Potassium: 4.6 mmol/L (ref 3.5–5.2)
Sodium: 141 mmol/L (ref 134–144)
Total Protein: 8.1 g/dL (ref 6.0–8.5)
eGFR: 83 mL/min/{1.73_m2} (ref >59)

## 2022-04-10 LAB — VITAMIN D 25 HYDROXY (VIT D DEFICIENCY, FRACTURES): Vit D, 25-Hydroxy: 41.4 ng/mL (ref 30.0–100.0)

## 2022-04-10 LAB — LIPID PANEL
Chol/HDL Ratio: 5 ratio — ABNORMAL HIGH (ref 0.0–4.4)
Cholesterol, Total: 223 mg/dL — ABNORMAL HIGH (ref 100–199)
HDL: 45 mg/dL (ref >39)
LDL Chol Calc (NIH): 149 mg/dL — ABNORMAL HIGH (ref 0–99)
Triglycerides: 161 mg/dL — ABNORMAL HIGH (ref 0–149)
VLDL Cholesterol Cal: 29 mg/dL (ref 5–40)

## 2022-04-10 LAB — HEMOGLOBIN A1C
Est. average glucose Bld gHb Est-mCnc: 105 mg/dL
Hgb A1c MFr Bld: 5.3 % (ref 4.8–5.6)

## 2022-04-10 LAB — TSH+FREE T4
Free T4: 1.08 ng/dL (ref 0.82–1.77)
TSH: 3.02 u[IU]/mL (ref 0.450–4.500)

## 2022-04-10 LAB — HEPATITIS C ANTIBODY: Hep C Virus Ab: NONREACTIVE

## 2022-04-10 LAB — HIV ANTIBODY (ROUTINE TESTING W REFLEX): HIV Screen 4th Generation wRfx: NONREACTIVE

## 2022-04-23 ENCOUNTER — Other Ambulatory Visit: Payer: Self-pay | Admitting: Family Medicine

## 2022-04-23 DIAGNOSIS — F419 Anxiety disorder, unspecified: Secondary | ICD-10-CM

## 2022-05-26 ENCOUNTER — Ambulatory Visit: Payer: BC Managed Care – PPO | Admitting: Family Medicine

## 2022-08-28 ENCOUNTER — Ambulatory Visit: Payer: BC Managed Care – PPO | Admitting: Family Medicine

## 2022-08-28 ENCOUNTER — Encounter: Payer: Self-pay | Admitting: Family Medicine

## 2022-08-28 VITALS — BP 117/71 | HR 70 | Ht 65.0 in | Wt 180.1 lb

## 2022-08-28 DIAGNOSIS — F419 Anxiety disorder, unspecified: Secondary | ICD-10-CM

## 2022-08-28 DIAGNOSIS — N898 Other specified noninflammatory disorders of vagina: Secondary | ICD-10-CM | POA: Diagnosis not present

## 2022-08-28 DIAGNOSIS — N760 Acute vaginitis: Secondary | ICD-10-CM | POA: Diagnosis not present

## 2022-08-28 DIAGNOSIS — M5030 Other cervical disc degeneration, unspecified cervical region: Secondary | ICD-10-CM | POA: Diagnosis not present

## 2022-08-28 DIAGNOSIS — M545 Low back pain, unspecified: Secondary | ICD-10-CM

## 2022-08-28 DIAGNOSIS — N3090 Cystitis, unspecified without hematuria: Secondary | ICD-10-CM

## 2022-08-28 LAB — POCT URINALYSIS DIP (CLINITEK)
Bilirubin, UA: NEGATIVE
Glucose, UA: NEGATIVE mg/dL
Ketones, POC UA: NEGATIVE mg/dL
Nitrite, UA: NEGATIVE
POC PROTEIN,UA: NEGATIVE
Spec Grav, UA: >=1.03 — AB (ref 1.010–1.025)
Urobilinogen, UA: 0.2 E.U./dL
pH, UA: 6 (ref 5.0–8.0)

## 2022-08-28 MED ORDER — SERTRALINE HCL 25 MG PO TABS: 25.0000 mg | ORAL_TABLET | Freq: Every day | ORAL | 1 refills | Status: AC

## 2022-08-28 MED ORDER — CYCLOBENZAPRINE HCL 5 MG PO TABS: 5.0000 mg | ORAL_TABLET | Freq: Every evening | ORAL | 1 refills | Status: AC | PRN

## 2022-08-28 MED ORDER — NITROFURANTOIN MONOHYD MACRO 100 MG PO CAPS: 100.0000 mg | ORAL_CAPSULE | Freq: Two times a day (BID) | ORAL | 0 refills | Status: AC

## 2022-08-28 NOTE — Patient Instructions (Addendum)
I appreciate the opportunity to provide care to you today!    Follow up:  1 months  Please complete the full course of the antibiotics   You can help prevent UTIs by doing the following:  -Avoid holding urine for prolonged periods; this stretches the bladder and causes bacteria to form because bacteria like warm and wet environments to grow -Empty the bladder as soon as the need arises.  -Empty your bladder soon after intercourse.  -Take showers instead of baths -Wipe front to back; doing so after urinating and after a bowel movement helps prevent bacteria in the anal region from spreading to the vagina and urethra. -Also, drink a full glass of water to help flush bacteria.    Please continue to a heart-healthy diet and increase your physical activities. Try to exercise for 59mins at least five days a week.      It was a pleasure to see you and I look forward to continuing to work together on your health and well-being. Please do not hesitate to call the office if you need care or have questions about your care.   Have a wonderful day and week. With Gratitude, Alvira Monday MSN, FNP-BC

## 2022-08-28 NOTE — Assessment & Plan Note (Signed)
UA shows a trace of blood. Will be treated based on physical examination findings Macrobid 100 mg twice daily for 5 days ordered  Please complete the full course of the antibiotics   You can help prevent UTIs by doing the following:  -Avoid holding urine for prolonged periods; this stretches the bladder and causes bacteria to form because bacteria like warm and wet environments to grow -Empty the bladder as soon as the need arises.  -Empty your bladder soon after intercourse.  -Take showers instead of baths -Wipe front to back; doing so after urinating and after a bowel movement helps prevent bacteria in the anal region from spreading to the vagina and urethra. -Also, drink a full glass of water to help flush bacteria.

## 2022-08-28 NOTE — Progress Notes (Signed)
Acute Office Visit  Subjective:    [name], female    DOB: 1970/09/08, 52 y.o.   MRN: [id number]  Chief Complaint  Patient presents with   Vaginal Discharge    Pt reports vaginal d/c since 03/2022. Has low back pain.     Vaginal Discharge The patient's primary symptoms include vaginal discharge. Pertinent negatives include no chills, fever or headaches.   Patient is in today with c/o a white to brownish colored discharge since 03/2022.  She denies a history of reported discharge.  No vulvar irritation, dryness, or purulence is noted.  No recent coital acts.  Urinary symptoms: Complaints of suprapubic pain and lower back pain.  No fever or chills reported.  No urgency or frequency with urination.  Past Medical History:  Diagnosis Date   Abdominal pain 06/12/2014   Allergy    Anemia    iron 1 yr ago, normal hgb 7/12   Anxiety    Asthma    pt says no-bronchitis   Bloated abdomen 10/15/2015   Chronic back pain    Chronic headaches    Chronic neck pain    Chronic sinusitis    Current use of estrogen therapy 04/16/2015   Dyspareunia 09/12/2014   Elevated TSH 04/16/2015   Fatigue 04/10/2015   Fibromyalgia    Gas 10/15/2015   GERD (gastroesophageal reflux disease)    Herpes simplex virus (HSV) infection    History of kidney stones    Hot flashes 04/10/2015   Hyperlipidemia    Kidney stone on right side 10/24/2015   Left-sided weakness    Mental disorder    Ovarian cyst, right    Paresthesias    Shortness of breath    Sinus drainage    Sjoegren syndrome    Symptoms, such as flushing, sleeplessness, headache, lack of concentration, associated with the menopause 04/10/2015   TMJ (dislocation of temporomandibular joint)    Vaginal discharge 08/08/2015   Vertigo    Yeast infection 10/15/2015    Past Surgical History:  Procedure Laterality Date   ABDOMINAL HYSTERECTOMY     BIOPSY  09/09/2016   Procedure: BIOPSY;  Surgeon: Daneil Dolin, MD;  [facility]: [facility];  Service: Endoscopy;;  esophageal   BREAST CYST EXCISION Right 1980   benign   CESAREAN SECTION     x2   CHOLECYSTECTOMY  2005   cholelithiasis   COLONOSCOPY N/A 09/09/2016   Procedure: COLONOSCOPY;  Surgeon: Daneil Dolin, MD;  Location: [facility];  Service: Endoscopy;  Laterality: N/A;  8:30am   ESOPHAGOGASTRODUODENOSCOPY  01/23/2011   [id number] undulating Z-line vs short segment Barrett s/p bx (NOT BARRETT's)/small HH otherwise normal   ESOPHAGOGASTRODUODENOSCOPY N/A 09/09/2016   Procedure: ESOPHAGOGASTRODUODENOSCOPY (EGD);  Surgeon: Daneil Dolin, MD;  Location: [facility];  Service: Endoscopy;  Laterality: N/A;   ESOPHAGOGASTRODUODENOSCOPY (EGD) WITH PROPOFOL N/A 08/08/2020   Procedure: ESOPHAGOGASTRODUODENOSCOPY (EGD) WITH PROPOFOL;  Surgeon: Daneil Dolin, MD;  Location: [facility];  Service: Endoscopy;  Laterality: N/A;  PM (ASA 3), pt tested + 2/7 <90 days   MALONEY DILATION  01/23/2011   Procedure: MALONEY DILATION;  Surgeon: Daneil Dolin, MD;  [facility]: [facility];  Service: Endoscopy;  Laterality: N/A;   MALONEY DILATION N/A 09/09/2016   Procedure: Venia Minks DILATION;  Surgeon: Daneil Dolin, MD;  Location: [facility];  Service: Endoscopy;  Laterality: N/A;   MALONEY DILATION N/A 08/08/2020   Procedure: MALONEY DILATION;  Surgeon:  Rourk, Cristopher Estimable, MD;  Location: [facility];  Service: Endoscopy;  Laterality: N/A;   OVARIAN CYST REMOVAL  89 removal of cysts from behind ovaries   PARTIAL HYSTERECTOMY  50   SEPTOPLASTY     2011   TEMPOROMANDIBULAR JOINT SURGERY     TONSILLECTOMY     2010   TURBINATE RESECTION Bilateral 05/29/2013   Procedure: TURBINATE RESECTION;  Surgeon: Ascencion Dike, MD;  Location: [facility];  Service: ENT;  Laterality: Bilateral;   UPPER GASTROINTESTINAL ENDOSCOPY      Family History  Problem Relation Age of Onset   Hypertension Mother    Thyroid disease Sister        overactive   Asthma Sister    Eczema  Sister    Bronchitis Son    Heart disease Maternal Grandmother    Hypertension Maternal Grandmother    Alcohol abuse Maternal Grandfather    Scoliosis Son    Other Maternal Aunt        Sjeogren syndrome   Other Maternal Aunt        Sjoegren syndrome   Other Maternal Aunt        Sjoegren syndrome   Anesthesia problems Neg Hx    Malignant hyperthermia Neg Hx    Hypotension Neg Hx    Pseudochol deficiency Neg Hx    Colon cancer Neg Hx     Social History   Socioeconomic History   Marital status: Divorced    Spouse name: Not on file   Number of children: 3   Years of education: Not on file   Highest education level: Not on file  Occupational History   Occupation: Packer Transport planner: [employer]  Tobacco Use   Smoking status: Never   Smokeless tobacco: Never  Vaping Use   Vaping Use: Never used  Substance and Sexual Activity   Alcohol use: No   Drug use: No   Sexual activity: Yes    Birth control/protection: Surgical    Comment: hyst  Other Topics Concern   Not on file  Social History Narrative   Lives home alone.    Social Determinants of Health   Financial Resource Strain: Medium Risk (07/25/2021)   Overall Financial Resource Strain (CARDIA)    Difficulty of Paying Living Expenses: Somewhat hard  Food Insecurity: No Food Insecurity (07/25/2021)   Hunger Vital Sign    Worried About Running Out of Food in the Last Year: Never true    Ran Out of Food in the Last Year: Never true  Transportation Needs: No Transportation Needs (07/25/2021)   PRAPARE - Hydrologist (Medical): No    Lack of Transportation (Non-Medical): No  Physical Activity: Sufficiently Active (07/25/2021)   Exercise Vital Sign    Days of Exercise per Week: 4 days    Minutes of Exercise per Session: 50 min  Stress: Stress Concern Present (07/25/2021)   [place]    Feeling of Stress : Very much   Social Connections: Moderately Isolated (07/25/2021)   Social Connection and Isolation Panel [NHANES]    Frequency of Communication with Friends and Family: More than three times a week    Frequency of Social Gatherings with Friends and Family: Once a week    Attends Religious Services: More than 4 times per year    Active Member of [employer] or Organizations: No    Attends Archivist Meetings:  Never    Marital Status: Divorced  Human resources officer Violence: Not At Risk (07/25/2021)   Humiliation, Afraid, Rape, and Kick questionnaire    Fear of Current or Ex-Partner: No    Emotionally Abused: No    Physically Abused: No    Sexually Abused: No    Outpatient Medications Prior to Visit  Medication Sig Dispense Refill   cetirizine (ZYRTEC ALLERGY) 10 MG tablet Take 1 tablet (10 mg total) by mouth daily. 90 tablet 3   [facility] DIGESTIVE PROBIOTIC 250 MG capsule Take 250 mg by mouth 2 (two) times daily.     pantoprazole (PROTONIX) 40 MG tablet Take 1 tablet (40 mg total) by mouth daily. (Patient not taking: Reported on 08/28/2022) 90 tablet 3   cyclobenzaprine (FLEXERIL) 5 MG tablet Take 1 tablet (5 mg total) by mouth at bedtime as needed for muscle spasms. (Patient not taking: Reported on 08/28/2022) 30 tablet 1   sertraline (ZOLOFT) 25 MG tablet Take 1 tablet (25 mg total) by mouth daily. (Patient not taking: Reported on 08/28/2022) 30 tablet 0   No facility-administered medications prior to visit.    Allergies  Allergen Reactions   Plaquenil [Hydroxychloroquine Sulfate] Rash    Review of Systems  Constitutional:  Negative for chills and fever.  Eyes:  Negative for visual disturbance.  Respiratory:  Negative for chest tightness and shortness of breath.   Genitourinary:  Positive for vaginal discharge.  Neurological:  Negative for dizziness and headaches.       Objective:    Physical Exam Abdominal:     Tenderness: There is abdominal tenderness. There is right CVA tenderness and  left CVA tenderness.     BP 117/71   Pulse 70   Ht 5\' 5"  (1.651 m)   Wt 180 lb 1.9 oz (81.7 kg)   SpO2 97%   BMI 29.97 kg/m  Wt Readings from Last 3 Encounters:  08/28/22 180 lb 1.9 oz (81.7 kg)  04/09/22 172 lb 1.9 oz (78.1 kg)  01/06/22 175 lb (79.4 kg)       Assessment & Plan:  Acute vaginitis Assessment & Plan: Pending nuswab   Vaginal discharge -     NuSwab BV and Candida, NAA  Cystitis Assessment & Plan: UA shows a trace of blood. Will be treated based on physical examination findings Macrobid 100 mg twice daily for 5 days ordered  Please complete the full course of the antibiotics   You can help prevent UTIs by doing the following:  -Avoid holding urine for prolonged periods; this stretches the bladder and causes bacteria to form because bacteria like warm and wet environments to grow -Empty the bladder as soon as the need arises.  -Empty your bladder soon after intercourse.  -Take showers instead of baths -Wipe front to back; doing so after urinating and after a bowel movement helps prevent bacteria in the anal region from spreading to the vagina and urethra. -Also, drink a full glass of water to help flush bacteria.   Orders: -     Nitrofurantoin Monohyd Macro; Take 1 capsule (100 mg total) by mouth 2 (two) times daily for 5 days.  Dispense: 10 capsule; Refill: 0  Degenerative cervical disc -     Cyclobenzaprine HCl; Take 1 tablet (5 mg total) by mouth at bedtime as needed for muscle spasms.  Dispense: 30 tablet; Refill: 1  Anxiety -     Sertraline HCl; Take 1 tablet (25 mg total) by mouth daily.  Dispense: 30 tablet; Refill: 1  Low back pain, unspecified back pain laterality, unspecified chronicity, unspecified whether sciatica present -     POCT URINALYSIS DIP (CLINITEK)    Alvira Monday, FNP

## 2022-08-28 NOTE — Assessment & Plan Note (Signed)
Pending nuswab

## 2022-09-01 ENCOUNTER — Other Ambulatory Visit: Payer: Self-pay | Admitting: Family Medicine

## 2022-09-01 DIAGNOSIS — N760 Acute vaginitis: Secondary | ICD-10-CM

## 2022-09-01 DIAGNOSIS — B9689 Other specified bacterial agents as the cause of diseases classified elsewhere: Secondary | ICD-10-CM

## 2022-09-01 MED ORDER — METRONIDAZOLE 500 MG PO TABS: 500.0000 mg | ORAL_TABLET | Freq: Two times a day (BID) | ORAL | 0 refills | Status: AC

## 2022-09-01 NOTE — Progress Notes (Signed)
Please inform the patient that her Nu swab shows that she has BV. A prescription for Flagy is sent to her pharmacy to take for 7 days

## 2022-09-02 LAB — NUSWAB BV AND CANDIDA, NAA
Atopobium vaginae: HIGH Score — AB
BVAB 2: HIGH Score — AB
Candida albicans, NAA: NEGATIVE
Candida glabrata, NAA: NEGATIVE

## 2022-09-10 ENCOUNTER — Other Ambulatory Visit: Payer: BC Managed Care – PPO | Admitting: Adult Health

## 2022-09-23 ENCOUNTER — Other Ambulatory Visit: Payer: BC Managed Care – PPO | Admitting: Adult Health

## 2022-09-29 ENCOUNTER — Ambulatory Visit: Payer: BC Managed Care – PPO | Admitting: Family Medicine

## 2022-10-15 ENCOUNTER — Ambulatory Visit: Payer: BC Managed Care – PPO | Admitting: Family Medicine

## 2022-11-04 ENCOUNTER — Other Ambulatory Visit: Payer: BC Managed Care – PPO | Admitting: Adult Health

## 2022-11-20 ENCOUNTER — Encounter: Payer: Self-pay | Admitting: Internal Medicine

## 2022-11-20 ENCOUNTER — Ambulatory Visit: Payer: BC Managed Care – PPO | Admitting: Internal Medicine

## 2022-11-20 VITALS — BP 110/74 | HR 86 | Ht 65.0 in | Wt 194.2 lb

## 2022-11-20 DIAGNOSIS — B9689 Other specified bacterial agents as the cause of diseases classified elsewhere: Secondary | ICD-10-CM | POA: Diagnosis not present

## 2022-11-20 DIAGNOSIS — J019 Acute sinusitis, unspecified: Secondary | ICD-10-CM

## 2022-11-20 MED ORDER — AMOXICILLIN 500 MG PO CAPS: 500.0000 mg | ORAL_CAPSULE | Freq: Three times a day (TID) | ORAL | 0 refills | Status: AC

## 2022-11-20 NOTE — Assessment & Plan Note (Signed)
Presenting today for an acute visit endorsing the symptoms described above.  Symptom onset began 6 days ago.  Symptoms of greatest concern to her are sinus pressure, worse at the left frontal sinus.  Nasal secretions have been yellow.  She has a nonproductive cough and has not experienced fever/chills. -Given symptom duration, overall worsening, and unilaterality of sinus pressure, I have prescribed amoxicillin 500 mg 3 times daily x 5 days for treatment of acute bacterial rhinosinusitis.  She was instructed to continue current supportive care measures and will return to care if symptoms continue to worsen or fail to improve.

## 2022-11-20 NOTE — Patient Instructions (Signed)
It was a pleasure to see you today.  Thank you for giving [place] the opportunity to be involved in your care.  Below is a brief recap of your visit and next steps.    Summary Amoxicillin x 5 days prescribed for sinus infection Please schedule follow up with Yvette Mayer if symptoms are not improving

## 2022-11-20 NOTE — Progress Notes (Signed)
Acute Office Visit  Subjective:     Patient ID: DESHANTE CASSELL, female    DOB: May 20, 1971, 52 y.o.   MRN: [id number]  Chief Complaint  Patient presents with   URI    Head congestion, drainage, yellow mucous. Started on 11/14/2022.    Ms. Ugarte presents today for an acute visit endorsing a 6-day history of upper respiratory symptoms.  She reports onset of head congestion, nonproductive cough, and sinus pain/pressure with yellow nasal secretions 6 days ago.  She denies fever/chills and sore throat.  She has recently traveled but is unaware of any sick contacts.  She has attempted to manage her symptoms with Tylenol and TheraFlu.  Overall she feels as though her symptoms are worsening.  Frontal sinus pressure is most significant on the left.  Review of Systems  Constitutional:  Positive for malaise/fatigue. Negative for chills and fever.  HENT:  Positive for congestion, ear pain (Bilateral) and sinus pain (L > R). Negative for ear discharge, hearing loss, nosebleeds, sore throat and tinnitus.   Eyes:  Negative for pain, discharge and redness.  Respiratory:  Positive for cough. Negative for sputum production, shortness of breath and stridor.   Cardiovascular:  Negative for chest pain.  Gastrointestinal:  Negative for diarrhea, nausea and vomiting.  Genitourinary:  Negative for dysuria.  Musculoskeletal:  Negative for myalgias and neck pain.        Objective:    BP 110/74   Pulse 86   Ht 5\' 5"  (1.651 m)   Wt 194 lb 3.2 oz (88.1 kg)   SpO2 96%   BMI 32.32 kg/m    Physical Exam Vitals reviewed.  Constitutional:      General: She is not in acute distress.    Appearance: Normal appearance. She is obese. She is not toxic-appearing.  HENT:     Head: Normocephalic and atraumatic.     Comments: TTP over the left frontal sinus    Right Ear: Tympanic membrane, ear canal and external ear normal.     Left Ear: Tympanic membrane, ear canal and external ear normal.     Nose: Congestion  and rhinorrhea present.     Mouth/Throat:     Mouth: Mucous membranes are moist.     Pharynx: Oropharynx is clear. No oropharyngeal exudate or posterior oropharyngeal erythema.  Eyes:     General: No scleral icterus.    Extraocular Movements: Extraocular movements intact.     Conjunctiva/sclera: Conjunctivae normal.     Pupils: Pupils are equal, round, and reactive to light.  Cardiovascular:     Rate and Rhythm: Normal rate and regular rhythm.     Pulses: Normal pulses.     Heart sounds: Normal heart sounds. No murmur heard.    No friction rub. No gallop.  Pulmonary:     Effort: Pulmonary effort is normal.     Breath sounds: Normal breath sounds. No wheezing, rhonchi or rales.  Abdominal:     General: Abdomen is flat. Bowel sounds are normal. There is no distension.     Palpations: Abdomen is soft.     Tenderness: There is no abdominal tenderness.  Musculoskeletal:        General: No swelling. Normal range of motion.     Cervical back: Normal range of motion.     Right lower leg: No edema.     Left lower leg: No edema.  Lymphadenopathy:     Cervical: No cervical adenopathy.  Skin:    General: Skin is  warm and dry.     Capillary Refill: Capillary refill takes less than 2 seconds.     Coloration: Skin is not jaundiced.  Neurological:     General: No focal deficit present.     Mental Status: She is alert and oriented to person, place, and time.  Psychiatric:        Mood and Affect: Mood normal.        Behavior: Behavior normal.       Assessment & Plan:   Problem List Items Addressed This Visit       Acute bacterial rhinosinusitis - Primary    Presenting today for an acute visit endorsing the symptoms described above.  Symptom onset began 6 days ago.  Symptoms of greatest concern to her are sinus pressure, worse at the left frontal sinus.  Nasal secretions have been yellow.  She has a nonproductive cough and has not experienced fever/chills. -Given symptom duration,  overall worsening, and unilaterality of sinus pressure, I have prescribed amoxicillin 500 mg 3 times daily x 5 days for treatment of acute bacterial rhinosinusitis.  She was instructed to continue current supportive care measures and will return to care if symptoms continue to worsen or fail to improve.       Meds ordered this encounter  Medications   amoxicillin (AMOXIL) 500 MG capsule    Sig: Take 1 capsule (500 mg total) by mouth 3 (three) times daily for 5 days.    Dispense:  15 capsule    Refill:  0    Return if symptoms worsen or fail to improve.  Billie Lade, MD

## 2022-12-18 ENCOUNTER — Other Ambulatory Visit: Payer: BC Managed Care – PPO | Admitting: Adult Health

## 2023-01-11 ENCOUNTER — Other Ambulatory Visit (HOSPITAL_COMMUNITY): Payer: Self-pay | Admitting: Family Medicine

## 2023-01-11 DIAGNOSIS — Z1231 Encounter for screening mammogram for malignant neoplasm of breast: Secondary | ICD-10-CM

## 2023-02-02 ENCOUNTER — Telehealth: Payer: Self-pay | Admitting: Family Medicine

## 2023-02-02 NOTE — Telephone Encounter (Signed)
FMLA  Noted  Copied Sleeved  Original in PCP box Copy front desk folder

## 2023-02-08 ENCOUNTER — Ambulatory Visit: Payer: BC Managed Care – PPO | Admitting: Family Medicine

## 2023-02-10 ENCOUNTER — Other Ambulatory Visit: Payer: BC Managed Care – PPO | Admitting: Adult Health

## 2023-02-11 ENCOUNTER — Ambulatory Visit (HOSPITAL_COMMUNITY): Payer: BC Managed Care – PPO

## 2023-02-12 DIAGNOSIS — Z0279 Encounter for issue of other medical certificate: Secondary | ICD-10-CM

## 2023-02-16 ENCOUNTER — Ambulatory Visit: Payer: BC Managed Care – PPO | Admitting: Family Medicine

## 2023-02-18 ENCOUNTER — Ambulatory Visit (HOSPITAL_COMMUNITY): Payer: BC Managed Care – PPO

## 2023-02-22 ENCOUNTER — Telehealth: Payer: Self-pay | Admitting: Family Medicine

## 2023-02-22 NOTE — Telephone Encounter (Signed)
Pt called in regard to FMLA   Patient wants a call back is needing a medical statement.  Wants to discuss with provider

## 2023-02-26 ENCOUNTER — Telehealth: Payer: Self-pay | Admitting: Family Medicine

## 2023-02-26 NOTE — Telephone Encounter (Signed)
I called and spoke with the patient, who is complaining of left neck pain and is requesting an extension of her FMLA form. She reports that her FMLA was approved for anxiety, but she is required to return to work on March 02, 2023. The patient expressed concerns about her neck pain and indicated that she is unsure if she can return to work.  I informed the patient that she will need to be examined in the clinic for a physical examination before an extension can be provided. The patient verbalized her understanding of this requirement. Notably, she did not report any red flag symptoms associated with her neck pain, such as numbness, tingling, or weakness in the left arm. Additionally, she reported no fever, chills, or neck stiffness.

## 2023-02-26 NOTE — Telephone Encounter (Signed)
Patient calling back waiting on a call back [phone]

## 2023-03-01 ENCOUNTER — Ambulatory Visit (HOSPITAL_COMMUNITY)
Admission: RE | Admit: 2023-03-01 | Discharge: 2023-03-01 | Disposition: A | Discharge status: Home / Self Care | Payer: BC Managed Care – PPO | Source: Ambulatory Visit | Attending: Family Medicine | Admitting: Family Medicine

## 2023-03-01 DIAGNOSIS — Z1231 Encounter for screening mammogram for malignant neoplasm of breast: Secondary | ICD-10-CM | POA: Insufficient documentation

## 2023-03-01 NOTE — Telephone Encounter (Signed)
Pt states all is good everything was received.

## 2023-03-11 ENCOUNTER — Ambulatory Visit: Payer: BC Managed Care – PPO | Admitting: Family Medicine

## 2023-03-23 ENCOUNTER — Ambulatory Visit: Payer: BC Managed Care – PPO | Admitting: Family Medicine

## 2023-03-24 ENCOUNTER — Other Ambulatory Visit: Payer: BC Managed Care – PPO | Admitting: Adult Health

## 2023-05-06 ENCOUNTER — Other Ambulatory Visit: Payer: BC Managed Care – PPO | Admitting: Adult Health

## 2023-06-14 ENCOUNTER — Other Ambulatory Visit: Payer: BC Managed Care – PPO | Admitting: Adult Health

## 2023-07-07 ENCOUNTER — Other Ambulatory Visit: Payer: BC Managed Care – PPO | Admitting: Adult Health

## 2023-08-11 ENCOUNTER — Other Ambulatory Visit: Payer: BC Managed Care – PPO | Admitting: Adult Health

## 2023-09-08 ENCOUNTER — Other Ambulatory Visit: Admitting: Adult Health
